# Patient Record
Sex: Male | Born: 1935 | Race: White | Hispanic: No | Marital: Married | Smoking: Former smoker
Health system: Southern US, Community
[De-identification: ages and names within clinical notes are randomized; demographics above are authoritative.]

## PROBLEM LIST (undated history)

## (undated) ENCOUNTER — Emergency Department (HOSPITAL_COMMUNITY): Admission: EM | Payer: Medicare Other | Source: Home / Self Care

## (undated) DIAGNOSIS — G20 Parkinson's disease: Secondary | ICD-10-CM

## (undated) DIAGNOSIS — I219 Acute myocardial infarction, unspecified: Secondary | ICD-10-CM

## (undated) DIAGNOSIS — G20A1 Parkinson's disease without dyskinesia, without mention of fluctuations: Secondary | ICD-10-CM

## (undated) DIAGNOSIS — I2510 Atherosclerotic heart disease of native coronary artery without angina pectoris: Secondary | ICD-10-CM

## (undated) DIAGNOSIS — E785 Hyperlipidemia, unspecified: Secondary | ICD-10-CM

## (undated) DIAGNOSIS — L0390 Cellulitis, unspecified: Secondary | ICD-10-CM

## (undated) DIAGNOSIS — I10 Essential (primary) hypertension: Secondary | ICD-10-CM

## (undated) DIAGNOSIS — G2581 Restless legs syndrome: Secondary | ICD-10-CM

## (undated) HISTORY — DX: Parkinson's disease: G20

## (undated) HISTORY — PX: CORONARY ARTERY BYPASS GRAFT: SHX141

## (undated) HISTORY — DX: Acute myocardial infarction, unspecified: I21.9

## (undated) HISTORY — PX: CARPAL TUNNEL RELEASE: SHX101

## (undated) HISTORY — DX: Cellulitis, unspecified: L03.90

## (undated) HISTORY — PX: APPENDECTOMY: SHX54

## (undated) HISTORY — DX: Essential (primary) hypertension: I10

## (undated) HISTORY — DX: Parkinson's disease without dyskinesia, without mention of fluctuations: G20.A1

---

## 1998-03-03 HISTORY — PX: CARDIAC SURGERY: SHX584

## 2001-03-03 HISTORY — PX: STOMACH SURGERY: SHX791

## 2016-09-30 ENCOUNTER — Encounter: Payer: Self-pay | Admitting: Neurology

## 2016-10-09 ENCOUNTER — Telehealth: Payer: Self-pay

## 2016-10-09 NOTE — Telephone Encounter (Signed)
SENT NOTES TO SCHEDULING 

## 2016-10-16 ENCOUNTER — Telehealth: Payer: Self-pay | Admitting: Internal Medicine

## 2016-10-16 NOTE — Telephone Encounter (Signed)
Received records from [facility] for appointment on 11/04/16 with Dr Rennis GoldenHilty.  Records put with Dr Blanchie DessertHilty's schedule for 11/04/16. lp

## 2016-10-21 ENCOUNTER — Ambulatory Visit (HOSPITAL_BASED_OUTPATIENT_CLINIC_OR_DEPARTMENT_OTHER): Payer: Self-pay

## 2016-10-22 ENCOUNTER — Encounter: Payer: Self-pay | Admitting: Neurology

## 2016-10-28 NOTE — Progress Notes (Signed)
Jack Carter was seen today in the movement disorders clinic for neurologic consultation at the request of Le, [name], DO.  The consultation is for the evaluation of Parkinsons.  Patient is accompanied by his son in law who supplements the history.  Pt previously taken care of in [place] (Dr. Daryel Gerald) but no records are available.  I have reviewed records from PCP.  Patient reports that he is diagnosed with Parkinson's disease in approximately 1-2 years ago.  His first symptom was shuffling of the feet and tremor.  The patient is currently on Stalevo 50, 1 tablet 4 times per day at 6 AM/10 AM/2 PM/6 PM. The patient is on Apokyn 30 mg/3  0.4 mL subcutaneous 5 times per day (son in law states that they d/c that 3 days ago because they felt that the patient was overdosing on the medication.  Felt that patient became mentally clearer when they d/c it) The patient is on amantadine 100 mg 3 times per day The patient is on Requip XL 4 mg 3 times per day The patient is on gabapentin 600 mg 4 times per day The patient is on propranolol 60 mg bid The patient is on primidone 50 mg bid  Specific Symptoms:  Tremor: Yes.  , hands bilaterally with use  Family hx of similar:  No. Voice: softer, never did voice therapy Sleep: sleeping okay per patient but son in law denies  Vivid Dreams:  Yes.    Acting out dreams:  Yes.   Wet Pillows: No. Postural symptoms:  Yes.    Falls?  Yes.  , 1 per day ([facility]; family just got him WC; uses rollator) Bradykinesia symptoms: shuffling gait, slow movements and difficulty getting out of a chair; states that he has never done PT Loss of smell:  Yes.   Loss of taste:  No. Urinary Incontinence:  Not usually unless related to lasix Difficulty Swallowing:  No. Handwriting, micrographia: Yes.   Trouble with ADL's:  No. but hes slow  Trouble buttoning clothing: Yes.  but he is able to do it.  Some trouble with tying shoes Depression:  No. Memory  changes:  Yes.   (short term) Hallucinations:  Yes.  , always recognizes not real  visual distortions: Yes.   N/V:  No. Lightheaded:  No.  Syncope: No. Diplopia:  Yes.   Dyskinesia:  No.  Neuroimaging of the brain is not available  PREVIOUS MEDICATIONS: Amantadine and stalevo, requip, apokyn  ALLERGIES:  No Known Allergies  CURRENT MEDICATIONS:  Outpatient Encounter Prescriptions as of 10/30/2016  Medication Sig  . amantadine (SYMMETREL) 100 MG capsule Take 100 mg by mouth 3 (three) times daily.  Marland Kitchen aspirin EC 81 MG tablet Take 81 mg by mouth daily.  Marland Kitchen atorvastatin (LIPITOR) 10 MG tablet Take 10 mg by mouth daily.  . carbidopa-levodopa-entacapone (STALEVO) 12.5-50-200 MG tablet Take 1 tablet by mouth 4 (four) times daily.  . furosemide (LASIX) 40 MG tablet Take 80 mg by mouth daily.  Marland Kitchen gabapentin (NEURONTIN) 600 MG tablet Take 600 mg by mouth 4 (four) times daily.  . metolazone (ZAROXOLYN) 5 MG tablet Take 5 mg by mouth 3 (three) times a week.  . MULTIPLE VITAMIN PO Take by mouth daily.  Marland Kitchen omeprazole (PRILOSEC) 40 MG capsule Take 40 mg by mouth daily.  . potassium chloride SA (K-DUR,KLOR-CON) 20 MEQ tablet Take 20 mEq by mouth. 2 in the morning, one at night  . primidone (MYSOLINE) 50  MG tablet Take 50 mg by mouth 2 (two) times daily.  . propranolol (INDERAL) 60 MG tablet Take 60 mg by mouth 2 (two) times daily.  Marland Kitchen rOPINIRole (REQUIP XL) 4 MG 24 hr tablet Take 4 mg by mouth 3 (three) times daily.  Marland Kitchen sulfamethoxazole-trimethoprim (BACTRIM DS,SEPTRA DS) 800-160 MG tablet TK 1 T PO BID FOR 10 DAYS  . traMADol (ULTRAM) 50 MG tablet Take by mouth 3 (three) times daily.  Marland Kitchen trimethobenzamide (TIGAN) 300 MG capsule Take 300 mg by mouth 3 (three) times daily.  . valsartan (DIOVAN) 160 MG tablet Take 160 mg by mouth 2 (two) times daily.  Marland Kitchen Apomorphine HCl (APOKYN) 30 MG/3ML SOCT Inject into the skin 5 (five) times daily.  . [DISCONTINUED] propranolol ER (INDERAL LA) 60 MG 24 hr capsule Take  60 mg by mouth 2 (two) times daily.   No facility-administered encounter medications on file as of 10/30/2016.     PAST MEDICAL HISTORY:   Past Medical History:  Diagnosis Date  . Cellulitis   . Heart attack (HCC)   . Hypertension   . Parkinson's disease (HCC)     PAST SURGICAL HISTORY:   Past Surgical History:  Procedure Laterality Date  . APPENDECTOMY    . CARDIAC SURGERY  2000  . CARPAL TUNNEL RELEASE Left   . STOMACH SURGERY  2003   intestinal fissure    SOCIAL HISTORY:   Social History   Social History  . Marital status: Single    Spouse name: N/A  . Number of children: N/A  . Years of education: N/A   Occupational History  . Not on file.   Social History Main Topics  . Smoking status: Never Smoker  . Smokeless tobacco: Never Used  . Alcohol use No  . Drug use: No  . Sexual activity: Not on file   Other Topics Concern  . Not on file   Social History Narrative  . No narrative on file    FAMILY HISTORY:   Family Status  Relation Status  . Mother Deceased  . Father Deceased       unknown  . Sister Alive  . Daughter Alive    ROS:  A complete 10 system review of systems was obtained and was unremarkable apart from what is mentioned above.  PHYSICAL EXAMINATION:    VITALS:   Vitals:   10/30/16 1323  BP: 112/60  Pulse: (!) 56  SpO2: 93%  Weight: 174 lb (78.9 kg)  Height: 5\' 9"  (1.753 m)    GEN:  The patient appears stated age and is in NAD. HEENT:  Normocephalic, atraumatic.  The mucous membranes are dry. The superficial temporal arteries are without ropiness or tenderness. CV:  RRR Lungs:  CTAB Neck/HEME:  There are no carotid bruits bilaterally. Skin:  Has evidence of cellulitis in the legs (on abx per son in law)  Neurological examination:  Orientation: The patient is alert and oriented x3. However, cannot recall what had for dinner last night.  Has good concentration.  Trouble with recall.  Looks to son in law for details of  hx. Cranial nerves: There is good facial symmetry. Pupils are equal round and reactive to light bilaterally. Fundoscopic exam is attempted but the disc margins are not well visualized bilaterally. Extraocular muscles are intact. The visual fields are full to confrontational testing. The speech is fluent and slightly dysarthric.  He is hypophonic.  Soft palate rises symmetrically and there is no tongue deviation. Hearing is decreased to  conversational tone. Sensation: Sensation is intact to light and pinprick throughout (facial, trunk, extremities). Vibration is intact at the bilateral big toe. There is no extinction with double simultaneous stimulation. There is no sensory dermatomal level identified. Motor: Strength is 5/5 in the bilateral upper and lower extremities.   Shoulder shrug is equal and symmetric.  There is no pronator drift. Deep tendon reflexes: Deep tendon reflexes are 2-/4 at the bilateral biceps, triceps, brachioradialis, patella and trace at the bilateral  achilles. Plantar responses is up on the right and neutral on the left  Movement examination: Tone: There is normal tone in the bilateral upper extremities.  The tone in the lower extremities is normal.  Abnormal movements: There is mild dyskinesia, especially in the legs Coordination:  There is  decremation with RAM's, with alternating supination and pronation of the forearm, hand opening and closing, finger taps, heel taps and toe taps on the L.  RAMs on the R are fairly good.  Mouth opens rhythmically when asked to do RAMs in arms/legs Gait and Station: The patient has  difficulty arising out of a deep-seated chair without the use of the hands and requires pushing off of the chair.  He uses a Rollator to ambulate.  Even with this, he has freezing and start hesitation.  He shuffles.  He drags the left leg.  He stops in doorways.  Labs: I got lab work from his primary care physician.  His sodium was 144, potassium 3.9, chloride 106,  CO2 31, BUN 19 and creatinine 1.18.  His glucose was 95.  His white blood cells were 7.0, hemoglobin 13.2, hematocrit 40.6 and platelets were low at 126.  ASSESSMENT/PLAN:  1.  Parkinsonism.    -We discussed the diagnosis as well as pathophysiology of the disease.  We discussed treatment options as well as prognostic indicators.  Patient education was provided.  -We discussed that it used to be thought that levodopa would increase risk of melanoma but now it is believed that Parkinsons itself likely increases risk of melanoma. he is to get regular skin checks.  -The patient was referred for home physical, occupational and speech therapy.  He is to use his walker at all times.  -We discussed community resources in the area including patient support groups and community exercise programs for PD and pt education was provided to the patient.  -It is somewhat unusual that the patient is taking Apokyn so frequently (5 times per day which is maximum number of times per day) and is on a very low dose of Stalevo 50 mg 4 times per day.  His family just stopped his Apokyn a few days ago and found that this did help clear him mentally.  -It is also somewhat unusual that the patient is taking Requip XL but is taking this 3 times per day (4 mg 3 times per day).  I discussed with the patient and his son-in-law that I think his medications greatly need rework.  He is on several medications that affect cognition and memory (Apokyn, Requip XL, amantadine) and yet he is on low-dose Stalevo.  He has hallucinations and confusion and these medications really need to be tapered and ultimately discontinued.  His Apokyn, as above has already been discontinued.  -They have lots of Stalevo at home, but unless they get it through the [facility], it is expensive for them so unless I changed him to traditional carbidopa/levodopa it is going to be difficult to raise the dose.  In  the end, we decided to slowly transition him so  that we do not waste his Stalevo.  He is going to take Stalevo 50 mg at 6 AM/L-dopa/levodopa 25/100 at 10 AM, Stalevo 150 at 2 PM and carbidopa/levodopa 25/100 at 6 PM.  -I will decrease his Requip XL from 4 mg 3 times per day to 4 mg twice per day for 2 weeks.  If he is doing okay he will drop down to 4 mg once per day and hold it there.  -For now, he will continue the amantadine 100 mg 3 times per day.  I plan to get him off of that in the future.  -For now, I will continue him on the primidone and propranolol.  I do not plan to continue these long term.   -The patient's family is administering medications.  -I really need to get his prior records to understand better exactly his diagnosis and medication load.  the patient reports that he was just diagnosed about a year ago.  This medication load would be very unusual for a patient just diagnosed a year ago, as would this degree of memory loss.  A release was signed today.  2.  I will see him back in 6-8 weeks.  This was a very high complexity visit.  Much greater than 50% of this visit was spent in counseling and coordinating care.  Total face to face time:  60 min.   Cc:  Le, [name], DO

## 2016-10-30 ENCOUNTER — Ambulatory Visit (INDEPENDENT_AMBULATORY_CARE_PROVIDER_SITE_OTHER): Payer: Medicare Other | Admitting: Neurology

## 2016-10-30 ENCOUNTER — Telehealth: Payer: Self-pay | Admitting: Neurology

## 2016-10-30 ENCOUNTER — Encounter: Payer: Self-pay | Admitting: Neurology

## 2016-10-30 VITALS — BP 112/60 | HR 56 | Ht 69.0 in | Wt 174.0 lb

## 2016-10-30 DIAGNOSIS — G20 Parkinson's disease: Secondary | ICD-10-CM

## 2016-10-30 DIAGNOSIS — R441 Visual hallucinations: Secondary | ICD-10-CM

## 2016-10-30 MED ORDER — CARBIDOPA-LEVODOPA 25-100 MG PO TABS: 1.0000 | ORAL_TABLET | Freq: Two times a day (BID) | ORAL | 1 refills | Status: DC

## 2016-10-30 NOTE — Telephone Encounter (Signed)
Referral to [facility] to add OT/ST faxed to [phone] with confirmation received. They will contact patient.

## 2016-10-30 NOTE — Patient Instructions (Addendum)
1.  Take stalevo 50 mg at 6am, take then NEW carbidopa/levodopa 25/100 at 10 am, take stalevo at 2pm, take the new carbidopa/levodopa 25/100 at 6pm.  Remember to take this 30 min before the meals  2.  Stay off of the apokyn  3.  Decrease requip XL (ropinirole XL) - 4 mg TWICE per day (not three times per day).  If doing okay in 2 weeks, drop to 4 mg (one tablet) once per day and hold it there  4.  For now, continue the amantadine.  I will change that in the future  5.  For now, continue the primidone.  I will likely change that in the future  6.  We will add occupational and speech therapy to your [facility] home health orders.   7.  Use your walker at all times  8.  Call me if you have more problems with mobility as we are dropping your meds

## 2016-10-31 ENCOUNTER — Inpatient Hospital Stay (HOSPITAL_COMMUNITY): Payer: Medicare Other

## 2016-10-31 ENCOUNTER — Emergency Department (HOSPITAL_COMMUNITY): Payer: Medicare Other

## 2016-10-31 ENCOUNTER — Inpatient Hospital Stay (HOSPITAL_COMMUNITY)
Admission: EM | Admit: 2016-10-31 | Discharge: 2016-11-18 | DRG: 004 | Disposition: A | Discharge status: Other Acute Inpatient Hospital | Payer: Medicare Other | Attending: Internal Medicine | Admitting: Internal Medicine

## 2016-10-31 DIAGNOSIS — I509 Heart failure, unspecified: Secondary | ICD-10-CM

## 2016-10-31 DIAGNOSIS — R001 Bradycardia, unspecified: Secondary | ICD-10-CM

## 2016-10-31 DIAGNOSIS — A419 Sepsis, unspecified organism: Secondary | ICD-10-CM | POA: Diagnosis present

## 2016-10-31 DIAGNOSIS — Z9289 Personal history of other medical treatment: Secondary | ICD-10-CM

## 2016-10-31 DIAGNOSIS — E860 Dehydration: Secondary | ICD-10-CM | POA: Diagnosis not present

## 2016-10-31 DIAGNOSIS — I25810 Atherosclerosis of coronary artery bypass graft(s) without angina pectoris: Secondary | ICD-10-CM | POA: Diagnosis present

## 2016-10-31 DIAGNOSIS — E876 Hypokalemia: Secondary | ICD-10-CM | POA: Diagnosis not present

## 2016-10-31 DIAGNOSIS — R778 Other specified abnormalities of plasma proteins: Secondary | ICD-10-CM | POA: Diagnosis present

## 2016-10-31 DIAGNOSIS — Z7189 Other specified counseling: Secondary | ICD-10-CM

## 2016-10-31 DIAGNOSIS — J9690 Respiratory failure, unspecified, unspecified whether with hypoxia or hypercapnia: Secondary | ICD-10-CM

## 2016-10-31 DIAGNOSIS — L03116 Cellulitis of left lower limb: Secondary | ICD-10-CM | POA: Diagnosis present

## 2016-10-31 DIAGNOSIS — G20 Parkinson's disease: Secondary | ICD-10-CM | POA: Diagnosis present

## 2016-10-31 DIAGNOSIS — T370X5A Adverse effect of sulfonamides, initial encounter: Secondary | ICD-10-CM | POA: Diagnosis present

## 2016-10-31 DIAGNOSIS — J80 Acute respiratory distress syndrome: Secondary | ICD-10-CM | POA: Diagnosis present

## 2016-10-31 DIAGNOSIS — R570 Cardiogenic shock: Secondary | ICD-10-CM

## 2016-10-31 DIAGNOSIS — Z7982 Long term (current) use of aspirin: Secondary | ICD-10-CM

## 2016-10-31 DIAGNOSIS — N170 Acute kidney failure with tubular necrosis: Secondary | ICD-10-CM | POA: Diagnosis present

## 2016-10-31 DIAGNOSIS — J690 Pneumonitis due to inhalation of food and vomit: Secondary | ICD-10-CM | POA: Diagnosis not present

## 2016-10-31 DIAGNOSIS — I130 Hypertensive heart and chronic kidney disease with heart failure and stage 1 through stage 4 chronic kidney disease, or unspecified chronic kidney disease: Secondary | ICD-10-CM | POA: Diagnosis present

## 2016-10-31 DIAGNOSIS — K922 Gastrointestinal hemorrhage, unspecified: Secondary | ICD-10-CM

## 2016-10-31 DIAGNOSIS — Z0189 Encounter for other specified special examinations: Secondary | ICD-10-CM

## 2016-10-31 DIAGNOSIS — I2510 Atherosclerotic heart disease of native coronary artery without angina pectoris: Secondary | ICD-10-CM | POA: Diagnosis present

## 2016-10-31 DIAGNOSIS — R748 Abnormal levels of other serum enzymes: Secondary | ICD-10-CM | POA: Diagnosis not present

## 2016-10-31 DIAGNOSIS — G92 Toxic encephalopathy: Secondary | ICD-10-CM | POA: Diagnosis present

## 2016-10-31 DIAGNOSIS — Z951 Presence of aortocoronary bypass graft: Secondary | ICD-10-CM

## 2016-10-31 DIAGNOSIS — M79676 Pain in unspecified toe(s): Secondary | ICD-10-CM

## 2016-10-31 DIAGNOSIS — E870 Hyperosmolality and hypernatremia: Secondary | ICD-10-CM | POA: Diagnosis not present

## 2016-10-31 DIAGNOSIS — Z978 Presence of other specified devices: Secondary | ICD-10-CM

## 2016-10-31 DIAGNOSIS — R7989 Other specified abnormal findings of blood chemistry: Secondary | ICD-10-CM

## 2016-10-31 DIAGNOSIS — J9600 Acute respiratory failure, unspecified whether with hypoxia or hypercapnia: Secondary | ICD-10-CM

## 2016-10-31 DIAGNOSIS — T17908A Unspecified foreign body in respiratory tract, part unspecified causing other injury, initial encounter: Secondary | ICD-10-CM

## 2016-10-31 DIAGNOSIS — Z430 Encounter for attention to tracheostomy: Secondary | ICD-10-CM | POA: Diagnosis not present

## 2016-10-31 DIAGNOSIS — I340 Nonrheumatic mitral (valve) insufficiency: Secondary | ICD-10-CM | POA: Diagnosis present

## 2016-10-31 DIAGNOSIS — I5043 Acute on chronic combined systolic (congestive) and diastolic (congestive) heart failure: Secondary | ICD-10-CM | POA: Diagnosis present

## 2016-10-31 DIAGNOSIS — I5084 End stage heart failure: Secondary | ICD-10-CM | POA: Diagnosis present

## 2016-10-31 DIAGNOSIS — R0600 Dyspnea, unspecified: Secondary | ICD-10-CM | POA: Diagnosis not present

## 2016-10-31 DIAGNOSIS — E785 Hyperlipidemia, unspecified: Secondary | ICD-10-CM | POA: Diagnosis present

## 2016-10-31 DIAGNOSIS — N179 Acute kidney failure, unspecified: Secondary | ICD-10-CM | POA: Diagnosis present

## 2016-10-31 DIAGNOSIS — Z452 Encounter for adjustment and management of vascular access device: Secondary | ICD-10-CM

## 2016-10-31 DIAGNOSIS — R6521 Severe sepsis with septic shock: Secondary | ICD-10-CM | POA: Diagnosis present

## 2016-10-31 DIAGNOSIS — G9340 Encephalopathy, unspecified: Secondary | ICD-10-CM

## 2016-10-31 DIAGNOSIS — J9601 Acute respiratory failure with hypoxia: Secondary | ICD-10-CM | POA: Diagnosis not present

## 2016-10-31 DIAGNOSIS — Z87891 Personal history of nicotine dependence: Secondary | ICD-10-CM

## 2016-10-31 DIAGNOSIS — T502X5A Adverse effect of carbonic-anhydrase inhibitors, benzothiadiazides and other diuretics, initial encounter: Secondary | ICD-10-CM | POA: Diagnosis not present

## 2016-10-31 DIAGNOSIS — Z66 Do not resuscitate: Secondary | ICD-10-CM | POA: Diagnosis present

## 2016-10-31 DIAGNOSIS — L03115 Cellulitis of right lower limb: Secondary | ICD-10-CM | POA: Diagnosis present

## 2016-10-31 DIAGNOSIS — G2581 Restless legs syndrome: Secondary | ICD-10-CM | POA: Diagnosis present

## 2016-10-31 DIAGNOSIS — G629 Polyneuropathy, unspecified: Secondary | ICD-10-CM | POA: Diagnosis present

## 2016-10-31 DIAGNOSIS — E875 Hyperkalemia: Secondary | ICD-10-CM

## 2016-10-31 DIAGNOSIS — T17908D Unspecified foreign body in respiratory tract, part unspecified causing other injury, subsequent encounter: Secondary | ICD-10-CM | POA: Diagnosis not present

## 2016-10-31 DIAGNOSIS — L03119 Cellulitis of unspecified part of limb: Secondary | ICD-10-CM | POA: Diagnosis not present

## 2016-10-31 DIAGNOSIS — F0280 Dementia in other diseases classified elsewhere without behavioral disturbance: Secondary | ICD-10-CM | POA: Diagnosis not present

## 2016-10-31 DIAGNOSIS — R0902 Hypoxemia: Secondary | ICD-10-CM

## 2016-10-31 DIAGNOSIS — G9341 Metabolic encephalopathy: Secondary | ICD-10-CM | POA: Diagnosis not present

## 2016-10-31 DIAGNOSIS — J189 Pneumonia, unspecified organism: Secondary | ICD-10-CM

## 2016-10-31 DIAGNOSIS — R739 Hyperglycemia, unspecified: Secondary | ICD-10-CM | POA: Diagnosis not present

## 2016-10-31 DIAGNOSIS — I214 Non-ST elevation (NSTEMI) myocardial infarction: Secondary | ICD-10-CM | POA: Diagnosis not present

## 2016-10-31 DIAGNOSIS — D638 Anemia in other chronic diseases classified elsewhere: Secondary | ICD-10-CM | POA: Diagnosis present

## 2016-10-31 DIAGNOSIS — L0390 Cellulitis, unspecified: Secondary | ICD-10-CM | POA: Diagnosis present

## 2016-10-31 DIAGNOSIS — Z789 Other specified health status: Secondary | ICD-10-CM

## 2016-10-31 DIAGNOSIS — I5041 Acute combined systolic (congestive) and diastolic (congestive) heart failure: Secondary | ICD-10-CM | POA: Diagnosis not present

## 2016-10-31 HISTORY — DX: Atherosclerotic heart disease of native coronary artery without angina pectoris: I25.10

## 2016-10-31 HISTORY — DX: Restless legs syndrome: G25.81

## 2016-10-31 HISTORY — DX: Hyperlipidemia, unspecified: E78.5

## 2016-10-31 HISTORY — DX: Essential (primary) hypertension: I10

## 2016-10-31 LAB — BASIC METABOLIC PANEL
Anion gap: 7 (ref 5–15)
Anion gap: 7 (ref 5–15)
BUN: 52 mg/dL — AB (ref 6–20)
BUN: 51 mg/dL — AB (ref 6–20)
CALCIUM: 8.5 mg/dL — AB (ref 8.9–10.3)
CALCIUM: 9.1 mg/dL (ref 8.9–10.3)
CO2: 20 mmol/L — AB (ref 22–32)
CO2: 24 mmol/L (ref 22–32)
CREATININE: 2.89 mg/dL — AB (ref 0.61–1.24)
Chloride: 107 mmol/L (ref 101–111)
Chloride: 107 mmol/L (ref 101–111)
Creatinine, Ser: 2.99 mg/dL — ABNORMAL HIGH (ref 0.61–1.24)
GFR calc Af Amer: 21 mL/min — ABNORMAL LOW (ref >60)
GFR calc Af Amer: 22 mL/min — ABNORMAL LOW (ref >60)
GFR, EST NON AFRICAN AMERICAN: 18 mL/min — AB (ref >60)
GFR, EST NON AFRICAN AMERICAN: 19 mL/min — AB (ref >60)
GLUCOSE: 160 mg/dL — AB (ref 65–99)
GLUCOSE: 231 mg/dL — AB (ref 65–99)
Potassium: >7.5 mmol/L (ref 3.5–5.1)
Potassium: 7 mmol/L (ref 3.5–5.1)
Sodium: 134 mmol/L — ABNORMAL LOW (ref 135–145)
Sodium: 138 mmol/L (ref 135–145)

## 2016-10-31 LAB — URINALYSIS, ROUTINE W REFLEX MICROSCOPIC
BILIRUBIN URINE: NEGATIVE
Glucose, UA: NEGATIVE mg/dL
Ketones, ur: 5 mg/dL — AB
Leukocytes, UA: NEGATIVE
Nitrite: NEGATIVE
PROTEIN: 100 mg/dL — AB
SPECIFIC GRAVITY, URINE: 1.021 (ref 1.005–1.030)
pH: 5 (ref 5.0–8.0)

## 2016-10-31 LAB — I-STAT CHEM 8, ED
BUN: 47 mg/dL — ABNORMAL HIGH (ref 6–20)
BUN: 46 mg/dL — ABNORMAL HIGH (ref 6–20)
CALCIUM ION: 1.18 mmol/L (ref 1.15–1.40)
CHLORIDE: 110 mmol/L (ref 101–111)
Calcium, Ion: 1.03 mmol/L — ABNORMAL LOW (ref 1.15–1.40)
Chloride: 112 mmol/L — ABNORMAL HIGH (ref 101–111)
Creatinine, Ser: 2.9 mg/dL — ABNORMAL HIGH (ref 0.61–1.24)
Creatinine, Ser: 2.9 mg/dL — ABNORMAL HIGH (ref 0.61–1.24)
GLUCOSE: 151 mg/dL — AB (ref 65–99)
GLUCOSE: 142 mg/dL — AB (ref 65–99)
HCT: 37 % — ABNORMAL LOW (ref 39.0–52.0)
HCT: 37 % — ABNORMAL LOW (ref 39.0–52.0)
HEMOGLOBIN: 12.6 g/dL — AB (ref 13.0–17.0)
HEMOGLOBIN: 12.6 g/dL — AB (ref 13.0–17.0)
POTASSIUM: 8.2 mmol/L — AB (ref 3.5–5.1)
Potassium: 6.8 mmol/L (ref 3.5–5.1)
SODIUM: 138 mmol/L (ref 135–145)
SODIUM: 144 mmol/L (ref 135–145)
TCO2: 20 mmol/L — ABNORMAL LOW (ref 22–32)
TCO2: 19 mmol/L — ABNORMAL LOW (ref 22–32)

## 2016-10-31 LAB — I-STAT CG4 LACTIC ACID, ED
LACTIC ACID, VENOUS: 4.11 mmol/L — AB (ref 0.5–1.9)
Lactic Acid, Venous: 2.05 mmol/L (ref 0.5–1.9)

## 2016-10-31 LAB — CBC WITH DIFFERENTIAL/PLATELET
BASOS ABS: 0 10*3/uL (ref 0.0–0.1)
Basophils Relative: 0 %
EOS PCT: 1 %
Eosinophils Absolute: 0.1 10*3/uL (ref 0.0–0.7)
HEMATOCRIT: 36.6 % — AB (ref 39.0–52.0)
Hemoglobin: 11.6 g/dL — ABNORMAL LOW (ref 13.0–17.0)
LYMPHS PCT: 6 %
Lymphs Abs: 0.8 10*3/uL (ref 0.7–4.0)
MCH: 31 pg (ref 26.0–34.0)
MCHC: 31.7 g/dL (ref 30.0–36.0)
MCV: 97.9 fL (ref 78.0–100.0)
MONO ABS: 0.2 10*3/uL (ref 0.1–1.0)
MONOS PCT: 1 %
NEUTROS ABS: 12.5 10*3/uL — AB (ref 1.7–7.7)
Neutrophils Relative %: 92 %
PLATELETS: 158 10*3/uL (ref 150–400)
RBC: 3.74 MIL/uL — ABNORMAL LOW (ref 4.22–5.81)
RDW: 15.8 % — AB (ref 11.5–15.5)
WBC: 13.6 10*3/uL — ABNORMAL HIGH (ref 4.0–10.5)

## 2016-10-31 LAB — HEPATIC FUNCTION PANEL
ALBUMIN: 3.2 g/dL — AB (ref 3.5–5.0)
ALK PHOS: 195 U/L — AB (ref 38–126)
ALT: 27 U/L (ref 17–63)
AST: 57 U/L — AB (ref 15–41)
BILIRUBIN INDIRECT: 0.9 mg/dL (ref 0.3–0.9)
Bilirubin, Direct: 0.4 mg/dL (ref 0.1–0.5)
TOTAL PROTEIN: 7.1 g/dL (ref 6.5–8.1)
Total Bilirubin: 1.3 mg/dL — ABNORMAL HIGH (ref 0.3–1.2)

## 2016-10-31 LAB — I-STAT TROPONIN, ED: Troponin i, poc: 0.11 ng/mL (ref 0.00–0.08)

## 2016-10-31 LAB — PROTIME-INR
INR: 1.1
PROTHROMBIN TIME: 14.1 s (ref 11.4–15.2)

## 2016-10-31 LAB — BRAIN NATRIURETIC PEPTIDE: B Natriuretic Peptide: 205.9 pg/mL — ABNORMAL HIGH (ref 0.0–100.0)

## 2016-10-31 LAB — CBG MONITORING, ED: GLUCOSE-CAPILLARY: 77 mg/dL (ref 65–99)

## 2016-10-31 LAB — TROPONIN I: Troponin I: 0.13 ng/mL (ref <0.03)

## 2016-10-31 LAB — APTT: aPTT: 21 seconds — ABNORMAL LOW (ref 24–36)

## 2016-10-31 MED ORDER — ASPIRIN 300 MG RE SUPP: 300.0000 mg | RECTAL | Status: DC

## 2016-10-31 MED ORDER — INSULIN ASPART 100 UNIT/ML IV SOLN
10.0000 [IU] | Freq: Once | INTRAVENOUS | Status: AC
  Administered 2016-10-31: 10 [IU] via INTRAVENOUS
  Filled 2016-10-31: qty 0.1

## 2016-10-31 MED ORDER — DOPAMINE-DEXTROSE 3.2-5 MG/ML-% IV SOLN
INTRAVENOUS | Status: DC | PRN
  Administered 2016-10-31: 5 ug/kg/min via INTRAVENOUS

## 2016-10-31 MED ORDER — SODIUM POLYSTYRENE SULFONATE 15 GM/60ML PO SUSP
60.0000 g | Freq: Once | ORAL | Status: AC
  Administered 2016-11-01: 60 g
  Filled 2016-10-31: qty 240

## 2016-10-31 MED ORDER — ATROPINE SULFATE 1 MG/ML IJ SOLN
INTRAMUSCULAR | Status: DC | PRN
  Administered 2016-10-31 (×5): .5 mg via INTRAVENOUS

## 2016-10-31 MED ORDER — SODIUM CHLORIDE 0.9 % IV SOLN: 250.0000 mL | INTRAVENOUS | Status: DC | PRN

## 2016-10-31 MED ORDER — CALCIUM GLUCONATE 10 % IV SOLN
1.0000 g | Freq: Once | INTRAVENOUS | Status: AC
  Administered 2016-10-31: 1 g via INTRAVENOUS
  Filled 2016-10-31: qty 10

## 2016-10-31 MED ORDER — FENTANYL 2500MCG IN NS 250ML (10MCG/ML) PREMIX INFUSION
0.0000 ug/h | INTRAVENOUS | Status: DC
  Administered 2016-10-31: 20 ug/h via INTRAVENOUS
  Administered 2016-11-01: 250 ug/h via INTRAVENOUS
  Filled 2016-10-31 (×2): qty 250

## 2016-10-31 MED ORDER — SODIUM CHLORIDE 0.9 % IV SOLN
80.0000 mg | Freq: Once | INTRAVENOUS | Status: AC
  Administered 2016-10-31: 80 mg via INTRAVENOUS
  Filled 2016-10-31: qty 80

## 2016-10-31 MED ORDER — ROCURONIUM BROMIDE 50 MG/5ML IV SOLN
INTRAVENOUS | Status: DC | PRN
  Administered 2016-10-31: 100 mg via INTRAVENOUS

## 2016-10-31 MED ORDER — DEXTROSE 5 % IV SOLN
0.0000 ug/min | Freq: Once | INTRAVENOUS | Status: AC
  Administered 2016-10-31: 5 ug/min via INTRAVENOUS
  Filled 2016-10-31: qty 4

## 2016-10-31 MED ORDER — INSULIN ASPART 100 UNIT/ML ~~LOC~~ SOLN
SUBCUTANEOUS | Status: AC
  Filled 2016-10-31: qty 1

## 2016-10-31 MED ORDER — ASPIRIN 81 MG PO CHEW: 324.0000 mg | CHEWABLE_TABLET | ORAL | Status: DC

## 2016-10-31 MED ORDER — VANCOMYCIN HCL 10 G IV SOLR
1500.0000 mg | Freq: Once | INTRAVENOUS | Status: AC
  Administered 2016-10-31: 1500 mg via INTRAVENOUS
  Filled 2016-10-31: qty 1500

## 2016-10-31 MED ORDER — NOREPINEPHRINE BITARTRATE 1 MG/ML IV SOLN
0.0000 ug/min | INTRAVENOUS | Status: DC
  Administered 2016-11-01: 4 ug/min via INTRAVENOUS
  Administered 2016-11-01: 8 ug/min via INTRAVENOUS
  Administered 2016-11-01: 4 ug/min via INTRAVENOUS
  Administered 2016-11-01: 12 ug/min via INTRAVENOUS
  Filled 2016-10-31 (×3): qty 4

## 2016-10-31 MED ORDER — PIPERACILLIN-TAZOBACTAM 3.375 G IVPB
3.3750 g | Freq: Three times a day (TID) | INTRAVENOUS | Status: DC
  Administered 2016-11-01: 3.375 g via INTRAVENOUS
  Filled 2016-10-31 (×2): qty 50

## 2016-10-31 MED ORDER — SODIUM CHLORIDE 0.9 % IV SOLN
8.0000 mg/h | INTRAVENOUS | Status: DC
  Administered 2016-10-31 – 2016-11-02 (×6): 8 mg/h via INTRAVENOUS
  Filled 2016-10-31 (×7): qty 80

## 2016-10-31 MED ORDER — DEXTROSE 50 % IV SOLN
INTRAVENOUS | Status: DC | PRN
  Administered 2016-10-31: 50 g via INTRAVENOUS

## 2016-10-31 MED ORDER — SODIUM BICARBONATE 8.4 % IV SOLN
INTRAVENOUS | Status: DC | PRN
  Administered 2016-10-31 (×2): 50 meq via INTRAVENOUS

## 2016-10-31 MED ORDER — EPINEPHRINE PF 1 MG/10ML IJ SOSY
PREFILLED_SYRINGE | INTRAMUSCULAR | Status: DC | PRN
  Administered 2016-10-31: 1 mg via INTRAVENOUS

## 2016-10-31 MED ORDER — ETOMIDATE 2 MG/ML IV SOLN
INTRAVENOUS | Status: DC | PRN
  Administered 2016-10-31: 8 mg via INTRAVENOUS

## 2016-10-31 MED ORDER — DEXTROSE 50 % IV SOLN
1.0000 | Freq: Once | INTRAVENOUS | Status: AC
  Administered 2016-10-31: 50 mL via INTRAVENOUS
  Filled 2016-10-31: qty 50

## 2016-10-31 MED ORDER — CALCIUM CHLORIDE 10 % IV SOLN
INTRAVENOUS | Status: DC | PRN
  Administered 2016-10-31: 1 g via INTRAVENOUS

## 2016-10-31 MED ORDER — PANTOPRAZOLE SODIUM 40 MG IV SOLR
40.0000 mg | Freq: Two times a day (BID) | INTRAVENOUS | Status: DC
  Administered 2016-11-04 – 2016-11-05 (×4): 40 mg via INTRAVENOUS
  Filled 2016-10-31 (×5): qty 40

## 2016-10-31 MED ORDER — PIPERACILLIN-TAZOBACTAM 3.375 G IVPB 30 MIN
3.3750 g | Freq: Once | INTRAVENOUS | Status: AC
  Administered 2016-10-31: 3.375 g via INTRAVENOUS
  Filled 2016-10-31: qty 50

## 2016-10-31 MED ORDER — SODIUM BICARBONATE 8.4 % IV SOLN
100.0000 meq | Freq: Once | INTRAVENOUS | Status: AC
  Administered 2016-10-31: 100 meq via INTRAVENOUS
  Filled 2016-10-31: qty 50

## 2016-10-31 NOTE — ED Triage Notes (Signed)
Per EMS: Pt was an unwitnessed fall from home. Family found him on the floor. No noticeable head trauma. Pt answered questions w/ EMS, but not in triage. Pulse was 34 w/ EMS. Pt given .5 mg of atropine and 500 fluid bolus. Two episodes of emesis w/ EMS.

## 2016-10-31 NOTE — ED Notes (Signed)
Report attempted 

## 2016-10-31 NOTE — ED Notes (Addendum)
Dr. Rosalia Hammersay and primary RN Windy Kalata(Yasemia) notified of Troponin of 0.13

## 2016-10-31 NOTE — H&P (Signed)
PULMONARY / CRITICAL CARE MEDICINE   Name: Jack Carter MRN: [id number] DOB: 04-21-1933    ADMISSION DATE:  10/31/2016 CONSULTATION DATE:  10/31/2016  REFERRING MD:  ED  CHIEF COMPLAINT:  Altered mental status  HISTORY OF PRESENT ILLNESS:   83 yr old male with Hx of parkinson disease, CAD s/p CABG, CHF who is coming after as per his daughter him being very weak in the last week. Today he was in the bathroom and she all of a sudden heard a thump and him screaming. He told her that he fell and maybe bumped his head but not hard. He did not lose consciousness. He tried to stand up again and he couldn't so she let him down and then shortly he started becoming unconscious.   As per daughter he has cellulitis of his legs and he has been feeling weak in the last week and though he might not be taking his medicines. He just moved from [place] so we dont have much info and she does not know much of his past either. On arrival to the ED was found to be severely bradycardic and hypotensive. IVC is full as per ED echo. Patient has K of more than 8 and is taking K and bactrim at home.   PAST MEDICAL HISTORY :  CHF CAD s/p CABG - parkinson  Hyperlipidemia   PAST SURGICAL HISTORY: He  has no past surgical history on file.  No known allergies    FAMILY HISTORY:  His has no family status information on file.   not significant to this admission   SOCIAL HISTORY: He just moved from [place] and lives with his daughter   REVIEW OF SYSTEMS:   Could not be obtained patient is on the vent   SUBJECTIVE:  obtunded  VITAL SIGNS: BP (!) 83/44   Pulse 65   Temp (!) 95.7 F (35.4 C)   Resp 18   Ht 5\' 9"  (1.753 m)   Wt 86.2 kg (190 lb)   SpO2 (!) 79%   BMI 28.06 kg/m   HEMODYNAMICS:    VENTILATOR SETTINGS: Vent Mode: PRVC FiO2 (%):  [100 %] 100 % Set Rate:  [18 bmp] 18 bmp Vt Set:  [580 mL] 580 mL PEEP:  [5 cmH20] 5 cmH20  INTAKE / OUTPUT: No intake/output data recorded.  PHYSICAL  EXAMINATION: General:  Obtunded  Neuro:  Obtunded  HEENT:  Intubated  Cardiovascular:  Bradycardic on dopaime drip  Lungs: clear equal air sounds bilaterally  Abdomen:  Soft n o tenderness no guarding  Musculoskeletal:  Bilateral lower limb cellulitis  Skin: see above  LABS:  BMET  Recent Labs Lab 10/31/16 1921 10/31/16 1928 10/31/16 2052  NA 134* 138 144  K >7.5* 8.2* 6.8*  CL 107 110 112*  CO2 20*  --   --   BUN 52* 47* 46*  CREATININE 2.99* 2.90* 2.90*  GLUCOSE 160* 151* 142*    Electrolytes  Recent Labs Lab 10/31/16 1921  CALCIUM 8.5*    CBC  Recent Labs Lab 10/31/16 1928 10/31/16 2052  HGB 12.6* 12.6*  HCT 37.0* 37.0*    Coag's No results for input(s): APTT, INR in the last 168 hours.  Sepsis Markers  Recent Labs Lab 10/31/16 1936  LATICACIDVEN 2.05*    ABG No results for input(s): PHART, PCO2ART, PO2ART in the last 168 hours.  ABG    Component Value Date/Time   TCO2 19 (L) 10/31/2016 2052   Liver Enzymes No results for input(s):  AST, ALT, ALKPHOS, BILITOT, ALBUMIN in the last 168 hours.  Cardiac Enzymes No results for input(s): TROPONINI, PROBNP in the last 168 hours.  Glucose  Recent Labs Lab 10/31/16 2026  GLUCAP 77    Imaging Dg Chest Portable 1 View  Result Date: 10/31/2016 CLINICAL DATA:  Post intubation. EXAM: PORTABLE CHEST 1 VIEW COMPARISON:  None. FINDINGS: An endotracheal tube is identified with tip 2.8 cm above the carina. An NG tube is identified entering the stomach with tip off the field of view. Defibrillator pads overlying the chest are noted. Cardiomegaly identified. No airspace disease, definite pleural effusion or pneumothorax noted. IMPRESSION: Support apparatus as described. Cardiomegaly without evidence of acute cardiopulmonary disease. Electronically Signed   By: Harmon PierJeffrey  Hu M.D.   On: 10/31/2016 19:59     STUDIES:  CT head >>  CULTURES: Blood Urine >>  ANTIBIOTICS: Zosyn vanc   SIGNIFICANT  EVENTS: Intubated 8/31  LINES/TUBES: femoral CVC   DISCUSSION: Patient is coming in with septic and cardiogenic shock secondary to cellulitis and hyperkalemia. Severe hyperkalemia from AKI, use of K and bactrim. Severe bradycardia from hyperkalemia. Acute hypoxemic respiratory failure. Severe metabolic encephalopathyh to rule out IC process   ASSESSMENT / PLAN:  PULMONARY A: acue hypoxemic resp failure requiring mechanical ventilation   P:   Wean FIO2 as tolerated   CARDIOVASCULAR A:  Cardiogenic shock secondary to severe bradycardia from hyperkalemia and septic shock from cellulitis  Known CAD s/p CABG    P:  Dopamine Start levophed Correct K Given 2.5 litres Echo  Cardio follow up  RENAL A:   Severe hyperkalemia from supplements, AKi an bactrim,  P:   Medical treatment for hyperkalemia Seen by nephro, no HD for now  Follow urine output   GASTROINTESTINAL A:   Upper GI bleeding ?stress vs traumatic   P:   - NPO NGT to suction PPI drip Serial CBC ? GI consult  HEMATOLOGIC A:   Mild anemia   P:  Serial CBC  INFECTIOUS A:   Cellulitis   P:   Zosyn vanc Follow cultures   ENDOCRINE A:   No issues  P:   Follow glucose    NEUROLOGIC A:   metabolci encephalopathy to r/o acute IC process since fall  P:   RASS goal: -2 Stat CT head    FAMILY  - Updates: spoke to family and they known his overall guarded condition. patient is now DNR but will continue aggressive care.   - Inter-disciplinary family meet or Palliative Care meeting due by:  9/7  I have spent 45 mins of CC time patient is needing ICU due to multiorgan failure requiring mechanical ventilation and pressors. This time exclusive of any billable procedures    Pulmonary and Critical Care Medicine [facility] Pager: [phone]  10/31/2016, 9:25 PM

## 2016-10-31 NOTE — ED Notes (Signed)
Sent label to main lab to add on hfp, trop, bnp

## 2016-10-31 NOTE — ED Notes (Signed)
2g or Mg given per Dr. Denny Levyay's order prior to intubation.

## 2016-10-31 NOTE — Consult Note (Signed)
Reason for Consult: Acute kidney injury, critical hyperkalemia Referring Physician: Silver Huguenin M.D (CCM)  HPI:  83 year old Caucasian man with past medical history significant for coronary artery disease, parkinsonism, hypertension, problems with volume overload and apparently normal renal function at baseline. Was in his usual state of health until after dinnertime today when he apparently turned blue around his lips and started developing altered level of consciousness. This was preceded by at least 1 or 2 days of some increased hand jerking movements. When brought into the emergency room, he was poorly responsive and unable to protect his airway leading to intubation. Found to be hypotensive after initially being hypertensive and started on pressor support with Levophed.  Concerned raised with elevated creatinine and hyperkalemia >7.5. Medication list reviewed and significant for ongoing potassium supplementation while on furosemide/metolazone, Diovan and recent Bactrim course. Temporizing measures for treating hyperkalemia have resulted in repeat potassium of 6.8.  His medical history is not quite clear as he was living in [place] up until about 2 months ago.  Past medical history: Parkinsonism Hypertension Coronary artery disease ? Congestive heart failure Dyslipidemia Restless leg syndrome  No past surgical history on file.  No family history on file.  Social History:  has no tobacco, alcohol, and drug history on file.  Allergies: No Known Allergies  Medications:  Scheduled: . calcium gluconate  1 g Intravenous Once  . dextrose  1 ampule Intravenous Once  . insulin aspart  10 Units Intravenous Once  . [START ON 11/04/2016] pantoprazole  40 mg Intravenous Q12H  . sodium bicarbonate  100 mEq Intravenous Once  . sodium polystyrene  60 g Per Tube Once    Results for orders placed or performed during the hospital encounter of 10/31/16 (from the past 48 hour(s))  Basic metabolic  panel     Status: Abnormal   Collection Time: 10/31/16  7:21 PM  Result Value Ref Range   Sodium 134 (L) 135 - 145 mmol/L   Potassium >7.5 (HH) 3.5 - 5.1 mmol/L    Comment: NO VISIBLE HEMOLYSIS CRITICAL RESULT CALLED TO, READ BACK BY AND VERIFIED WITH: C. DART RN [id number] GREEN R    Chloride 107 101 - 111 mmol/L   CO2 20 (L) 22 - 32 mmol/L   Glucose, Bld 160 (H) 65 - 99 mg/dL   BUN 52 (H) 6 - 20 mg/dL   Creatinine, Ser 2.99 (H) 0.61 - 1.24 mg/dL   Calcium 8.5 (L) 8.9 - 10.3 mg/dL   GFR calc non Af Amer 18 (L) >60 mL/min   GFR calc Af Amer 21 (L) >60 mL/min    Comment: (NOTE) The eGFR has been calculated using the CKD EPI equation. This calculation has not been validated in all clinical situations. eGFR's persistently <60 mL/min signify possible Chronic Kidney Disease.    Anion gap 7 5 - 15  I-stat chem 8, ed     Status: Abnormal   Collection Time: 10/31/16  7:28 PM  Result Value Ref Range   Sodium 138 135 - 145 mmol/L   Potassium 8.2 (HH) 3.5 - 5.1 mmol/L   Chloride 110 101 - 111 mmol/L   BUN 47 (H) 6 - 20 mg/dL   Creatinine, Ser 2.90 (H) 0.61 - 1.24 mg/dL   Glucose, Bld 151 (H) 65 - 99 mg/dL   Calcium, Ion 1.03 (L) 1.15 - 1.40 mmol/L   TCO2 20 (L) 22 - 32 mmol/L   Hemoglobin 12.6 (L) 13.0 - 17.0 g/dL   HCT 37.0 (L)  39.0 - 52.0 %   Comment NOTIFIED PHYSICIAN   I-Stat CG4 Lactic Acid, ED     Status: Abnormal   Collection Time: 10/31/16  7:36 PM  Result Value Ref Range   Lactic Acid, Venous 2.05 (HH) 0.5 - 1.9 mmol/L   Comment NOTIFIED PHYSICIAN   I-Stat Troponin, ED (not at [facility])     Status: Abnormal   Collection Time: 10/31/16  7:52 PM  Result Value Ref Range   Troponin i, poc 0.11 (HH) 0.00 - 0.08 ng/mL   Comment NOTIFIED PHYSICIAN    Comment 3            Comment: Due to the release kinetics of cTnI, a negative result within the first hours of the onset of symptoms does not rule out myocardial infarction with certainty. If myocardial infarction is still  suspected, repeat the test at appropriate intervals.   Urinalysis, Routine w reflex microscopic     Status: Abnormal   Collection Time: 10/31/16  8:20 PM  Result Value Ref Range   Color, Urine AMBER (A) YELLOW    Comment: BIOCHEMICALS MAY BE AFFECTED BY COLOR   APPearance HAZY (A) CLEAR   Specific Gravity, Urine 1.021 1.005 - 1.030   pH 5.0 5.0 - 8.0   Glucose, UA NEGATIVE NEGATIVE mg/dL   Hgb urine dipstick SMALL (A) NEGATIVE   Bilirubin Urine NEGATIVE NEGATIVE   Ketones, ur 5 (A) NEGATIVE mg/dL   Protein, ur 100 (A) NEGATIVE mg/dL   Nitrite NEGATIVE NEGATIVE   Leukocytes, UA NEGATIVE NEGATIVE   RBC / HPF 6-30 0 - 5 RBC/hpf   WBC, UA 0-5 0 - 5 WBC/hpf   Bacteria, UA RARE (A) NONE SEEN   Squamous Epithelial / LPF 0-5 (A) NONE SEEN   Mucus PRESENT    Hyaline Casts, UA PRESENT   CBG monitoring, ED     Status: None   Collection Time: 10/31/16  8:26 PM  Result Value Ref Range   Glucose-Capillary 77 65 - 99 mg/dL   Comment 1 Notify RN    Comment 2 Document in Chart   I-Stat Chem 8, ED     Status: Abnormal   Collection Time: 10/31/16  8:52 PM  Result Value Ref Range   Sodium 144 135 - 145 mmol/L   Potassium 6.8 (HH) 3.5 - 5.1 mmol/L   Chloride 112 (H) 101 - 111 mmol/L   BUN 46 (H) 6 - 20 mg/dL   Creatinine, Ser 2.90 (H) 0.61 - 1.24 mg/dL   Glucose, Bld 142 (H) 65 - 99 mg/dL   Calcium, Ion 1.18 1.15 - 1.40 mmol/L   TCO2 19 (L) 22 - 32 mmol/L   Hemoglobin 12.6 (L) 13.0 - 17.0 g/dL   HCT 37.0 (L) 39.0 - 52.0 %   Comment NOTIFIED PHYSICIAN     Dg Chest Portable 1 View  Result Date: 10/31/2016 CLINICAL DATA:  Post intubation. EXAM: PORTABLE CHEST 1 VIEW COMPARISON:  None. FINDINGS: An endotracheal tube is identified with tip 2.8 cm above the carina. An NG tube is identified entering the stomach with tip off the field of view. Defibrillator pads overlying the chest are noted. Cardiomegaly identified. No airspace disease, definite pleural effusion or pneumothorax noted. IMPRESSION:  Support apparatus as described. Cardiomegaly without evidence of acute cardiopulmonary disease. Electronically Signed   By: Margarette Canada M.D.   On: 10/31/2016 19:59    Review of Systems  Unable to perform ROS: Intubated   Blood pressure (!) 83/44, pulse 65, temperature (!)  95.7 F (35.4 C), resp. rate 18, height 5' 9" (1.753 m), weight 86.2 kg (190 lb), SpO2 (!) 79 %. Physical Exam  Nursing note and vitals reviewed. Constitutional: He appears well-developed and well-nourished.  Elderly-appearing man, intubated/sedated  HENT:  Head: Normocephalic and atraumatic.  Neck: No JVD present. No thyromegaly present.  Cardiovascular: Normal rate, regular rhythm and normal heart sounds.   No murmur heard. Respiratory: Effort normal. He has no wheezes. He has no rales.  GI: Soft. Bowel sounds are normal. There is no tenderness. There is no rebound.  Musculoskeletal: Normal range of motion. He exhibits edema.  1+ lower extremity edema with chronic venous stasis changes. Both legs in wraps.  Skin: Skin is warm and dry. No rash noted.    Assessment/Plan: 1. Acute kidney injury: Appears hemodynamically mediated with possible ATN associated with shock likely septic versus cardiogenic origin. Cultures drawn and pending-currently on broad-spectrum antibiotic therapy. Will check urine electrolytes-indication from initial urinalysis that this is an acute glomerulonephritis process. It may be entirely plausible that his elevated creatinine is from recent Bactrim use. Will check renal ultrasound to rule out any obstruction. 2. Hyperkalemia: Appears to be primarily iatrogenic from ongoing potassium supplementation with additional role from Diovan/Bactrim. Compounded further by recent development of acute kidney injury. Agree with medical management at this time including Kayexalate use. 3. Shock/hypothermia: Ongoing evaluation for etiology with differentials including cardiogenic shock versus sepsis. On Levophed  at this time and plans in place for fluid resuscitation.  4. Parkinsonism: Monitor currently off of his usual medications.  , K. 10/31/2016, 9:33 PM

## 2016-10-31 NOTE — Progress Notes (Signed)
Pharmacy Antibiotic Note  Jack Carter is a 83 y.o. male admitted on 10/31/2016 with sepsis.    Plan: Zosyn 3.375 gm Iv q8h Vanc 1500 mg x 1 F/u renal fx, cx Further vanc dosing based on renal fx  Height: 5\' 9"  (175.3 cm) Weight: 190 lb (86.2 kg) IBW/kg (Calculated) : 70.7  Temp (24hrs), Avg:95.6 F (35.3 C), Min:95 F (35 C), Max:96.9 F (36.1 C)   Recent Labs Lab 10/31/16 1921 10/31/16 1928 10/31/16 1936 10/31/16 2052  CREATININE 2.99* 2.90*  --  2.90*  LATICACIDVEN  --   --  2.05*  --     Estimated Creatinine Clearance: 21.7 mL/min (A) (by C-G formula based on SCr of 2.9 mg/dL (H)).    No Known Allergies  Isaac BlissMichael Alesia Oshields, PharmD, BCPS, BCCCP Clinical Pharmacist Clinical phone for 10/31/2016 from 7a-3:30p: [phone] If after 3:30p, please call main pharmacy at: [phone] 10/31/2016 9:51 PM

## 2016-10-31 NOTE — ED Provider Notes (Signed)
[facility]-EMERGENCY DEPT Provider Note   CSN: [id number] Arrival date & time: 10/31/16  1904     History   Chief Complaint Chief Complaint  Patient presents with  . Loss of Consciousness    HPI Jack Carter is a 83 y.o. male.  This is a 83 year old male with PMH of CAD status post 2 stent placement and currently on aspirin, CHF (unknown last EF), Parkinson disease who presents with unstable bradycardia from home.  He presents via EMS with intermittent bouts of altered mental status, he was bradycardic to the 30s at scene. Patient has no known history of kidney disease.  He has had issues in the past with volume overload.  Patient unable to provide history given his acuity of his condition and altered mental status.  Daughter provided much of historical information.  The past 2 days the daughter is noticed the patient having increased jerking movements of his hands and feet. No changes in medications.  The patient has bilateral ankle and foot wounds which he claims daily.  While cleaning his wound today he apparently had fallen.  The daughter stated he had no change in mental status or symptomatology after his fall and approximately 2 hours elapsed until he became cyanotic around his lips and had waxing mental status changes along with worsening him jerking movements.  She eventually called EMS who arrived and transported the patient to the ED for further evaluation.   The history is provided by a relative. The history is limited by the condition of the patient.   No past medical history on file.  Patient Active Problem List   Diagnosis Date Noted  . Septic shock (HCC) 10/31/2016  . Cellulitis 10/31/2016  . Hyperkalemia 10/31/2016  . AKI (acute kidney injury) (HCC) 10/31/2016  . Cardiogenic shock (HCC) 10/31/2016  . Acute hypoxemic respiratory failure (HCC) 10/31/2016  . CAD (coronary artery disease) of artery bypass graft 10/31/2016  . CHF (congestive heart failure) (HCC)  10/31/2016  . Parkinson disease (HCC) 10/31/2016  . Upper GI bleed 10/31/2016   No past surgical history on file.     Home Medications    Prior to Admission medications   Medication Sig Start Date End Date Taking? Authorizing Provider  amantadine (SYMMETREL) 100 MG capsule Take 100 mg by mouth 3 (three) times daily.   Yes [provider]  aspirin EC 81 MG tablet Take 81 mg by mouth daily.   Yes [provider]  atorvastatin (LIPITOR) 10 MG tablet Take 10 mg by mouth at bedtime.   Yes [provider]  carbidopa-levodopa (SINEMET IR) 25-100 MG tablet Take 1 tablet by mouth 2 (two) times daily.   Yes [provider]  carbidopa-levodopa-entacapone (STALEVO) 50-200-200 MG tablet Take 1 tablet by mouth 2 (two) times daily.   Yes [provider]  furosemide (LASIX) 40 MG tablet Take 80 mg by mouth daily.   Yes [provider]  gabapentin (NEURONTIN) 600 MG tablet Take 600 mg by mouth 4 (four) times daily.   Yes [provider]  metolazone (ZAROXOLYN) 5 MG tablet Take 5 mg by mouth every Monday, Wednesday, and Friday.   Yes [provider]  Multiple Vitamins-Minerals (CENTRUM SILVER 50+MEN) TABS Take 1 tablet by mouth daily.   Yes [provider]  omeprazole (PRILOSEC) 40 MG capsule Take 40 mg by mouth daily.   Yes [provider]  potassium chloride SA (K-DUR,KLOR-CON) 20 MEQ tablet Take 20-40 mEq by mouth See admin instructions. 40 mEq in  the morning and 20 mEq in the afternoon   Yes [provider]  primidone (MYSOLINE) 50 MG tablet Take 50 mg by mouth 2 (two) times daily.   Yes [provider]  propranolol (INDERAL) 60 MG tablet Take 60 mg by mouth 2 (two) times daily.   Yes [provider]  rOPINIRole (REQUIP) 4 MG tablet Take 4 mg by mouth 2 (two) times daily.   Yes [provider]  traMADol (ULTRAM) 50 MG tablet Take 50 mg by mouth 3 (three) times daily.   Yes  [provider]  trimethobenzamide (TIGAN) 300 MG capsule Take 300 mg by mouth 3 (three) times daily as needed for nausea/vomiting.   Yes [provider]  valsartan (DIOVAN) 160 MG tablet Take 160 mg by mouth 2 (two) times daily.   Yes [provider]    Family History No family history on file.  Social History Social History  Substance Use Topics  . Smoking status: Not on file  . Smokeless tobacco: Not on file  . Alcohol use Not on file     Allergies   Patient has no known allergies.   Review of Systems Review of Systems  Unable to perform ROS: Acuity of condition     Physical Exam Updated Vital Signs BP 103/66   Pulse 69   Temp (!) 97.3 F (36.3 C)   Resp 18   Ht 5\' 9"  (1.753 m)   Wt 86.2 kg (190 lb)   SpO2 93%   BMI 28.06 kg/m   Physical Exam  Constitutional: He appears lethargic. He has a sickly appearance. He appears distressed. Face mask in place.  HENT:  Head: Normocephalic and atraumatic.  Eyes: Pupils are equal, round, and reactive to light. Conjunctivae are normal.  Neck: Neck supple.  Cardiovascular: An irregular rhythm present. Bradycardia present.  Exam reveals decreased pulses.   No murmur heard. Pulses:      Radial pulses are 1+ on the right side, and 1+ on the left side.       Femoral pulses are 1+ on the right side, and 1+ on the left side.      Dorsalis pedis pulses are 1+ on the right side, and 1+ on the left side.  Pulmonary/Chest: Effort normal and breath sounds normal. No respiratory distress.  Abdominal: Soft. There is no tenderness.  Musculoskeletal: He exhibits no edema.  Neurological: He appears lethargic.  Unable to perform neurological examination given patient's acute condition with mental status change.  Skin: Skin is warm.  Psychiatric: He has a normal mood and affect.  Nursing note and vitals reviewed.   ED Treatments / Results  Labs (all labs ordered are listed, but only abnormal results are  displayed) Labs Reviewed  BASIC METABOLIC PANEL - Abnormal; Notable for the following:       Result Value   Sodium 134 (*)    Potassium >7.5 (*)    CO2 20 (*)    Glucose, Bld 160 (*)    BUN 52 (*)    Creatinine, Ser 2.99 (*)    Calcium 8.5 (*)    GFR calc non Af Amer 18 (*)    GFR calc Af Amer 21 (*)    All other components within normal limits  URINALYSIS, ROUTINE W REFLEX MICROSCOPIC - Abnormal; Notable for the following:    Color, Urine AMBER (*)    APPearance HAZY (*)    Hgb urine dipstick SMALL (*)    Ketones, ur 5 (*)  Protein, ur 100 (*)    Bacteria, UA RARE (*)    Squamous Epithelial / LPF 0-5 (*)    All other components within normal limits  HEPATIC FUNCTION PANEL - Abnormal; Notable for the following:    Albumin 3.2 (*)    AST 57 (*)    Alkaline Phosphatase 195 (*)    Total Bilirubin 1.3 (*)    All other components within normal limits  TROPONIN I - Abnormal; Notable for the following:    Troponin I 0.13 (*)    All other components within normal limits  APTT - Abnormal; Notable for the following:    aPTT 21 (*)    All other components within normal limits  CBC WITH DIFFERENTIAL/PLATELET - Abnormal; Notable for the following:    WBC 13.6 (*)    RBC 3.74 (*)    Hemoglobin 11.6 (*)    HCT 36.6 (*)    RDW 15.8 (*)    Neutro Abs 12.5 (*)    All other components within normal limits  BASIC METABOLIC PANEL - Abnormal; Notable for the following:    Potassium 7.0 (*)    Glucose, Bld 231 (*)    BUN 51 (*)    Creatinine, Ser 2.89 (*)    GFR calc non Af Amer 19 (*)    GFR calc Af Amer 22 (*)    All other components within normal limits  BRAIN NATRIURETIC PEPTIDE - Abnormal; Notable for the following:    B Natriuretic Peptide 205.9 (*)    All other components within normal limits  I-STAT TROPONIN, ED - Abnormal; Notable for the following:    Troponin i, poc 0.11 (*)    All other components within normal limits  I-STAT CG4 LACTIC ACID, ED - Abnormal; Notable for  the following:    Lactic Acid, Venous 2.05 (*)    All other components within normal limits  I-STAT CHEM 8, ED - Abnormal; Notable for the following:    Potassium 8.2 (*)    BUN 47 (*)    Creatinine, Ser 2.90 (*)    Glucose, Bld 151 (*)    Calcium, Ion 1.03 (*)    TCO2 20 (*)    Hemoglobin 12.6 (*)    HCT 37.0 (*)    All other components within normal limits  I-STAT CHEM 8, ED - Abnormal; Notable for the following:    Potassium 6.8 (*)    Chloride 112 (*)    BUN 46 (*)    Creatinine, Ser 2.90 (*)    Glucose, Bld 142 (*)    TCO2 19 (*)    Hemoglobin 12.6 (*)    HCT 37.0 (*)    All other components within normal limits  I-STAT CG4 LACTIC ACID, ED - Abnormal; Notable for the following:    Lactic Acid, Venous 4.11 (*)    All other components within normal limits  CULTURE, BLOOD (ROUTINE X 2)  CULTURE, BLOOD (ROUTINE X 2)  URINE CULTURE  PROTIME-INR  CBC WITH DIFFERENTIAL/PLATELET  CBC WITH DIFFERENTIAL/PLATELET  BASIC METABOLIC PANEL  BASIC METABOLIC PANEL  URINALYSIS, ROUTINE W REFLEX MICROSCOPIC  POTASSIUM  POTASSIUM  SODIUM, URINE, RANDOM  CBC WITH DIFFERENTIAL/PLATELET  CBC WITH DIFFERENTIAL/PLATELET  BASIC METABOLIC PANEL  BASIC METABOLIC PANEL  POTASSIUM  POTASSIUM  POTASSIUM  CBG MONITORING, ED  I-STAT ARTERIAL BLOOD GAS, ED    EKG  EKG Interpretation  Date/Time:  Friday October 31 2016 20:01:22 EDT Ventricular Rate:  47 PR Interval:  QRS Duration: 148 QT Interval:  444 QTC Calculation: 393 R Axis:   92 Text Interpretation:  Atrial fibrillation Nonspecific intraventricular conduction delay Probable anteroseptal infarct, old Confirmed by Margarita Grizzle [phone]) on 10/31/2016 8:31:56 PM       Radiology [place] Retroperitoneal (renal,aorta,ivc Nodes)  Result Date: 10/31/2016 CLINICAL DATA:  Acute renal injury EXAM: RENAL / URINARY TRACT ULTRASOUND COMPLETE COMPARISON:  None. FINDINGS: Right Kidney: Length: 10.1 cm. Echogenicity within normal limits. No mass  or hydronephrosis visualized. Left Kidney: Length: 10.6 cm. Echogenicity within normal limits. No mass or hydronephrosis visualized. Bladder: Decompressed by Foley catheter. IMPRESSION: Maintained cortical-medullary distinction within both kidneys without obstructive uropathy. Decompressed bladder with indwelling Foley catheter. Electronically Signed   By: Tollie Eth M.D.   On: 10/31/2016 22:25   Dg Chest Portable 1 View  Result Date: 10/31/2016 CLINICAL DATA:  Post intubation. EXAM: PORTABLE CHEST 1 VIEW COMPARISON:  None. FINDINGS: An endotracheal tube is identified with tip 2.8 cm above the carina. An NG tube is identified entering the stomach with tip off the field of view. Defibrillator pads overlying the chest are noted. Cardiomegaly identified. No airspace disease, definite pleural effusion or pneumothorax noted. IMPRESSION: Support apparatus as described. Cardiomegaly without evidence of acute cardiopulmonary disease. Electronically Signed   By: Harmon Pier M.D.   On: 10/31/2016 19:59    Procedures Procedure Name: Intubation Date/Time: 10/31/2016 11:46 PM Performed by: Shaune Pollack Pre-anesthesia Checklist: Patient identified, Emergency Drugs available, Suction available and Patient being monitored Oxygen Delivery Method: Non-rebreather mask Preoxygenation: Pre-oxygenation with 100% oxygen Induction Type: Rapid sequence Ventilation: Mask ventilation without difficulty Laryngoscope Size: Glidescope Grade View: Grade I Tube size: 7.5 mm Number of attempts: 1 Airway Equipment and Method: Patient positioned with wedge pillow and Rigid stylet Placement Confirmation: ETT inserted through vocal cords under direct vision,  Positive ETCO2,  CO2 detector and Breath sounds checked- equal and bilateral Tube secured with: ETT holder Difficulty Due To: Difficulty was unanticipated    .Central Line Date/Time: 10/31/2016 11:47 PM Performed by: Shaune Pollack Authorized by: Shaune Pollack    Consent:    Consent obtained:  Emergent situation Pre-procedure details:    Hand hygiene: Hand hygiene performed prior to insertion     Sterile barrier technique: All elements of maximal sterile technique followed     Skin preparation:  Betadine Procedure details:    Location:  R femoral   Site selection rationale:  Ease of access without [place] in an acute, critical care situation   Patient position:  Flat   Procedural supplies:  Triple lumen   Catheter size:  8.5 Fr   Landmarks identified: yes     Ultrasound guidance: no     Number of attempts:  1   Successful placement: yes   Post-procedure details:    Post-procedure:  Dressing applied and line sutured   Assessment:  Blood return through all ports and free fluid flow   Patient tolerance of procedure:  Tolerated well, no immediate complications   (including critical care time)  Medications Ordered in ED Medications  atropine injection (0.5 mg Intravenous Given 10/31/16 1959)  EPINEPHrine (ADRENALIN) 1 MG/10ML injection (1 mg Intravenous Given 10/31/16 1924)  etomidate (AMIDATE) injection (8 mg Intravenous Given 10/31/16 1927)  rocuronium (ZEMURON) injection (100 mg Intravenous Given 10/31/16 1930)  calcium chloride injection (1 g Intravenous Given 10/31/16 1933)  fentaNYL in NS (59mcg/ml) infusion-PREMIX (20 mcg/hr Intravenous New Bag/Given 10/31/16 1946)  dextrose 50 % solution (50 g Intravenous Given 10/31/16  1937)  sodium bicarbonate injection (50 mEq Intravenous Given 10/31/16 1950)  DOPamine (INTROPIN) 800 mg in dextrose 5 % 250 mL (3.2 mg/mL) infusion (20 mcg/kg/min  86.2 kg Intravenous Rate/Dose Change 10/31/16 2029)  0.9 %  sodium chloride infusion (not administered)  norepinephrine (LEVOPHED) 4 mg in dextrose 5 % 250 mL (0.016 mg/mL) infusion (not administered)  pantoprazole (PROTONIX) 80 mg in sodium chloride 0.9 % 250 mL (0.32 mg/mL) infusion (8 mg/hr Intravenous New Bag/Given 10/31/16 2254)  pantoprazole  (PROTONIX) injection 40 mg (not administered)  sodium polystyrene (KAYEXALATE) 15 GM/60ML suspension 60 g (not administered)  insulin aspart (novoLOG) 100 UNIT/ML injection (  Canceled Entry 10/31/16 2147)  piperacillin-tazobactam (ZOSYN) IVPB 3.375 g (0 g Intravenous Stopped 10/31/16 2255)    Followed by  piperacillin-tazobactam (ZOSYN) IVPB 3.375 g (not administered)  vancomycin (VANCOCIN) 1,500 mg in sodium chloride 0.9 % 500 mL IVPB (1,500 mg Intravenous New Bag/Given 10/31/16 2216)  insulin aspart (novoLOG) injection 10 Units (10 Units Intravenous Given 10/31/16 1937)  norepinephrine (LEVOPHED) 4 mg in dextrose 5 % 250 mL (0.016 mg/mL) infusion (5 mcg/min Intravenous New Bag/Given 10/31/16 2132)  calcium gluconate inj 10% (1 g) URGENT USE ONLY! (1 g Intravenous Given 10/31/16 2143)  insulin aspart (novoLOG) injection 10 Units (10 Units Intravenous Given 10/31/16 2142)  dextrose 50 % solution 50 mL (50 mLs Intravenous Given 10/31/16 2142)  sodium bicarbonate injection 100 mEq (100 mEq Intravenous Given 10/31/16 2143)  pantoprazole (PROTONIX) 80 mg in sodium chloride 0.9 % 100 mL IVPB (0 mg Intravenous Stopped 10/31/16 2253)     Initial Impression / Assessment and Plan / ED Course  I have reviewed the triage vital signs and the nursing notes.  Pertinent labs & imaging results that were available during my care of the patient were reviewed by me and considered in my medical decision making (see chart for details).     This is a 83 year old male with PMH of CAD status post 2 stent placement and currently on aspirin, CHF (unknown last EF), Parkinson disease who presents with unstable bradycardia from home.  He presents via EMS with intermittent bouts of altered mental status, he was bradycardic to the 30s at scene.  Upon arrival patient was intermittently lethargic and unresponsive with weak pulses.  Initial blood pressure was in the 100's systolic. It was determined he would need intubation for  airway protection and central line placement due to difficulty placing access and recognized need for critical care IV access. See procedure note above for details. Procedures performed without complication.  Initial potassium level 8.5 consistent with EKG changes.  Initial EKG showed wide rhythm with bradycardia and Sine waves.  Patient given 0.5 atropine in route, he was given another 1 of atropine upon arrival. Patient given calcium chloride through his central line along with sodium bicarbonate x2, 1 dose of epinephrine given his severe bradycardia and hypotension post intubation, 2 g magnesium, insulin and dextrose.  Lactate 2.5.  Initial troponin 0.11. IV fluids were begun but after 2 L were given bedside echocardiography performed by myself showed a dilated IVC with global reduction in heart function and no focal wall abnormalities.  IV fluids were halted given concern for CHF and fluid overload.  Patient's picture concerning for CHF exacerbation with developing cardiorenal syndrome given new rising creatinine at 2.89, potassium 8.2. Cardiogenic shock suspected.  Cardiology called to bedside who agreed patient did not require electrical pacing at this time and instead his electrolyte issues needed to be addressed.  Nephrology was consulted for bedside evaluation, critical care team was consulted for admission. Patient continued to become bradycardic and given drop in systolic blood pressure to 70s dopamine infusion was begun.  Spoke with daughter at bedside, all questions answered.  Final Clinical Impressions(s) / ED Diagnoses   Final diagnoses:  Hyperkalemia  End stage congestive heart failure (HCC)  Cardiogenic shock (HCC)  Bradycardia, sinus    New Prescriptions New Prescriptions   No medications on file     Shaune Pollack, MD 10/31/16 2349    Margarita Grizzle, MD 11/01/16 2237

## 2016-10-31 NOTE — Progress Notes (Signed)
   10/31/16 1935  Clinical Encounter Type  Visited With Patient and family together  Visit Type ED  Spiritual Encounters  Spiritual Needs Emotional  Stress Factors  Patient Stress Factors Health changes  Family Stress Factors Exhausted  Introduction to daughter as Pt undergoing procedures. [employer].

## 2016-11-01 ENCOUNTER — Encounter (HOSPITAL_COMMUNITY): Payer: Self-pay | Admitting: Nurse Practitioner

## 2016-11-01 LAB — CBC WITH DIFFERENTIAL/PLATELET
BASOS ABS: 0 10*3/uL (ref 0.0–0.1)
BASOS ABS: 0 10*3/uL (ref 0.0–0.1)
BASOS PCT: 0 %
BASOS PCT: 0 %
Basophils Absolute: 0 10*3/uL (ref 0.0–0.1)
Basophils Absolute: 0 10*3/uL (ref 0.0–0.1)
Basophils Relative: 0 %
Basophils Relative: 0 %
EOS ABS: 0 10*3/uL (ref 0.0–0.7)
EOS ABS: 0.2 10*3/uL (ref 0.0–0.7)
EOS PCT: 1 %
EOS PCT: 2 %
Eosinophils Absolute: 0.1 10*3/uL (ref 0.0–0.7)
Eosinophils Absolute: 0.2 10*3/uL (ref 0.0–0.7)
Eosinophils Relative: 0 %
Eosinophils Relative: 2 %
HCT: 35.7 % — ABNORMAL LOW (ref 39.0–52.0)
HCT: 35.5 % — ABNORMAL LOW (ref 39.0–52.0)
HEMATOCRIT: 41.4 % (ref 39.0–52.0)
HEMATOCRIT: 36.1 % — AB (ref 39.0–52.0)
HEMOGLOBIN: 11 g/dL — AB (ref 13.0–17.0)
Hemoglobin: 13.3 g/dL (ref 13.0–17.0)
Hemoglobin: 11.6 g/dL — ABNORMAL LOW (ref 13.0–17.0)
Hemoglobin: 11.5 g/dL — ABNORMAL LOW (ref 13.0–17.0)
Lymphocytes Relative: 10 %
Lymphocytes Relative: 11 %
Lymphocytes Relative: 13 %
Lymphocytes Relative: 10 %
Lymphs Abs: 1 10*3/uL (ref 0.7–4.0)
Lymphs Abs: 1 10*3/uL (ref 0.7–4.0)
Lymphs Abs: 1.5 10*3/uL (ref 0.7–4.0)
Lymphs Abs: 1.1 10*3/uL (ref 0.7–4.0)
MCH: 31 pg (ref 26.0–34.0)
MCH: 30.9 pg (ref 26.0–34.0)
MCH: 30.8 pg (ref 26.0–34.0)
MCH: 30.2 pg (ref 26.0–34.0)
MCHC: 32.1 g/dL (ref 30.0–36.0)
MCHC: 32.5 g/dL (ref 30.0–36.0)
MCHC: 31.9 g/dL (ref 30.0–36.0)
MCHC: 31 g/dL (ref 30.0–36.0)
MCV: 96.5 fL (ref 78.0–100.0)
MCV: 95.2 fL (ref 78.0–100.0)
MCV: 96.8 fL (ref 78.0–100.0)
MCV: 97.5 fL (ref 78.0–100.0)
MONO ABS: 1.2 10*3/uL — AB (ref 0.1–1.0)
MONO ABS: 1.3 10*3/uL — AB (ref 0.1–1.0)
MONO ABS: 1.1 10*3/uL — AB (ref 0.1–1.0)
MONOS PCT: 12 %
MONOS PCT: 16 %
MONOS PCT: 11 %
MONOS PCT: 10 %
Monocytes Absolute: 1.3 10*3/uL — ABNORMAL HIGH (ref 0.1–1.0)
NEUTROS ABS: 7.6 10*3/uL (ref 1.7–7.7)
NEUTROS ABS: 5.9 10*3/uL (ref 1.7–7.7)
NEUTROS ABS: 9 10*3/uL — AB (ref 1.7–7.7)
NEUTROS PCT: 72 %
NEUTROS PCT: 78 %
Neutro Abs: 8.4 10*3/uL — ABNORMAL HIGH (ref 1.7–7.7)
Neutrophils Relative %: 78 %
Neutrophils Relative %: 74 %
PLATELETS: 162 10*3/uL (ref 150–400)
PLATELETS: 136 10*3/uL — AB (ref 150–400)
PLATELETS: 155 10*3/uL (ref 150–400)
Platelets: 153 10*3/uL (ref 150–400)
RBC: 4.29 MIL/uL (ref 4.22–5.81)
RBC: 3.75 MIL/uL — ABNORMAL LOW (ref 4.22–5.81)
RBC: 3.73 MIL/uL — ABNORMAL LOW (ref 4.22–5.81)
RBC: 3.64 MIL/uL — ABNORMAL LOW (ref 4.22–5.81)
RDW: 15.8 % — AB (ref 11.5–15.5)
RDW: 15.6 % — AB (ref 11.5–15.5)
RDW: 16.1 % — AB (ref 11.5–15.5)
RDW: 16.4 % — AB (ref 11.5–15.5)
WBC: 9.8 10*3/uL (ref 4.0–10.5)
WBC: 8.3 10*3/uL (ref 4.0–10.5)
WBC: 12 10*3/uL — ABNORMAL HIGH (ref 4.0–10.5)
WBC: 10.8 10*3/uL — ABNORMAL HIGH (ref 4.0–10.5)

## 2016-11-01 LAB — URINALYSIS, ROUTINE W REFLEX MICROSCOPIC
BACTERIA UA: NONE SEEN
BILIRUBIN URINE: NEGATIVE
GLUCOSE, UA: NEGATIVE mg/dL
KETONES UR: NEGATIVE mg/dL
LEUKOCYTES UA: NEGATIVE
Nitrite: NEGATIVE
PROTEIN: 100 mg/dL — AB
Specific Gravity, Urine: 1.019 (ref 1.005–1.030)
pH: 5 (ref 5.0–8.0)

## 2016-11-01 LAB — SODIUM, URINE, RANDOM: Sodium, Ur: 36 mmol/L

## 2016-11-01 LAB — POTASSIUM
POTASSIUM: 5.5 mmol/L — AB (ref 3.5–5.1)
Potassium: >7.5 mmol/L (ref 3.5–5.1)
Potassium: 6.9 mmol/L (ref 3.5–5.1)

## 2016-11-01 LAB — GLUCOSE, CAPILLARY
GLUCOSE-CAPILLARY: 130 mg/dL — AB (ref 65–99)
GLUCOSE-CAPILLARY: 181 mg/dL — AB (ref 65–99)
GLUCOSE-CAPILLARY: 86 mg/dL (ref 65–99)
Glucose-Capillary: 123 mg/dL — ABNORMAL HIGH (ref 65–99)
Glucose-Capillary: 85 mg/dL (ref 65–99)
Glucose-Capillary: 91 mg/dL (ref 65–99)

## 2016-11-01 LAB — BASIC METABOLIC PANEL
ANION GAP: 10 (ref 5–15)
Anion gap: 9 (ref 5–15)
Anion gap: 7 (ref 5–15)
BUN: 50 mg/dL — AB (ref 6–20)
BUN: 47 mg/dL — AB (ref 6–20)
BUN: 44 mg/dL — ABNORMAL HIGH (ref 6–20)
CALCIUM: 8.9 mg/dL (ref 8.9–10.3)
CALCIUM: 8.3 mg/dL — AB (ref 8.9–10.3)
CALCIUM: 8.2 mg/dL — AB (ref 8.9–10.3)
CO2: 23 mmol/L (ref 22–32)
CO2: 24 mmol/L (ref 22–32)
CO2: 24 mmol/L (ref 22–32)
CREATININE: 3.15 mg/dL — AB (ref 0.61–1.24)
Chloride: 109 mmol/L (ref 101–111)
Chloride: 113 mmol/L — ABNORMAL HIGH (ref 101–111)
Chloride: 113 mmol/L — ABNORMAL HIGH (ref 101–111)
Creatinine, Ser: 3.09 mg/dL — ABNORMAL HIGH (ref 0.61–1.24)
Creatinine, Ser: 3.02 mg/dL — ABNORMAL HIGH (ref 0.61–1.24)
GFR calc Af Amer: 20 mL/min — ABNORMAL LOW (ref >60)
GFR calc Af Amer: 21 mL/min — ABNORMAL LOW (ref >60)
GFR calc non Af Amer: 18 mL/min — ABNORMAL LOW (ref >60)
GFR calc non Af Amer: 17 mL/min — ABNORMAL LOW (ref >60)
GFR, EST AFRICAN AMERICAN: 20 mL/min — AB (ref >60)
GFR, EST NON AFRICAN AMERICAN: 17 mL/min — AB (ref >60)
GLUCOSE: 243 mg/dL — AB (ref 65–99)
GLUCOSE: 98 mg/dL (ref 65–99)
Glucose, Bld: 126 mg/dL — ABNORMAL HIGH (ref 65–99)
POTASSIUM: 5.5 mmol/L — AB (ref 3.5–5.1)
Potassium: 7.3 mmol/L (ref 3.5–5.1)
Potassium: 5.2 mmol/L — ABNORMAL HIGH (ref 3.5–5.1)
SODIUM: 147 mmol/L — AB (ref 135–145)
Sodium: 141 mmol/L (ref 135–145)
Sodium: 144 mmol/L (ref 135–145)

## 2016-11-01 LAB — POCT I-STAT 3, ART BLOOD GAS (G3+)
ACID-BASE EXCESS: 1 mmol/L (ref 0.0–2.0)
BICARBONATE: 24.5 mmol/L (ref 20.0–28.0)
O2 SAT: 100 %
PH ART: 7.461 — AB (ref 7.350–7.450)
PO2 ART: 402 mmHg — AB (ref 83.0–108.0)
TCO2: 26 mmol/L (ref 22–32)
pCO2 arterial: 34.3 mmHg (ref 32.0–48.0)

## 2016-11-01 LAB — MRSA PCR SCREENING: MRSA by PCR: NEGATIVE

## 2016-11-01 MED ORDER — DOPAMINE-DEXTROSE 3.2-5 MG/ML-% IV SOLN
INTRAVENOUS | Status: AC
  Administered 2016-11-01: 800 mg
  Filled 2016-11-01: qty 250

## 2016-11-01 MED ORDER — DEXMEDETOMIDINE HCL IN NACL 400 MCG/100ML IV SOLN
0.4000 ug/kg/h | INTRAVENOUS | Status: DC
  Administered 2016-11-02: 0.4 ug/kg/h via INTRAVENOUS
  Filled 2016-11-01: qty 100

## 2016-11-01 MED ORDER — SODIUM BICARBONATE 8.4 % IV SOLN
50.0000 meq | Freq: Once | INTRAVENOUS | Status: AC
  Administered 2016-11-01: 50 meq via INTRAVENOUS
  Filled 2016-11-01: qty 50

## 2016-11-01 MED ORDER — CALCIUM GLUCONATE 10 % IV SOLN: 1.0000 g | Freq: Once | INTRAVENOUS | Status: DC

## 2016-11-01 MED ORDER — SODIUM CHLORIDE 0.9 % IV SOLN: INTRAVENOUS | Status: DC | PRN

## 2016-11-01 MED ORDER — DEXTROSE 50 % IV SOLN
1.0000 | Freq: Once | INTRAVENOUS | Status: AC
  Administered 2016-11-01: 50 mL via INTRAVENOUS
  Filled 2016-11-01: qty 50

## 2016-11-01 MED ORDER — FENTANYL CITRATE (PF) 100 MCG/2ML IJ SOLN: 50.0000 ug | INTRAMUSCULAR | Status: DC | PRN

## 2016-11-01 MED ORDER — DEXMEDETOMIDINE HCL 200 MCG/2ML IV SOLN
0.4000 ug/kg/h | INTRAVENOUS | Status: DC
  Administered 2016-11-01 (×2): 0.4 ug/kg/h via INTRAVENOUS
  Filled 2016-11-01 (×3): qty 2

## 2016-11-01 MED ORDER — CARBIDOPA-LEVODOPA 25-100 MG PO TABS
1.0000 | ORAL_TABLET | Freq: Three times a day (TID) | ORAL | Status: DC
  Administered 2016-11-01 – 2016-11-03 (×7): 1
  Filled 2016-11-01 (×10): qty 1

## 2016-11-01 MED ORDER — SODIUM POLYSTYRENE SULFONATE 15 GM/60ML PO SUSP
60.0000 g | Freq: Once | ORAL | Status: AC
  Administered 2016-11-01: 60 g
  Filled 2016-11-01: qty 240

## 2016-11-01 MED ORDER — AMANTADINE HCL 50 MG/5ML PO SYRP
100.0000 mg | ORAL_SOLUTION | Freq: Three times a day (TID) | ORAL | Status: DC
  Administered 2016-11-01 – 2016-11-03 (×6): 100 mg
  Filled 2016-11-01 (×9): qty 10

## 2016-11-01 MED ORDER — INSULIN ASPART 100 UNIT/ML IV SOLN
10.0000 [IU] | Freq: Once | INTRAVENOUS | Status: AC
  Administered 2016-11-01: 10 [IU] via INTRAVENOUS

## 2016-11-01 MED ORDER — SODIUM CHLORIDE 0.9 % IV SOLN
1.0000 g | Freq: Once | INTRAVENOUS | Status: AC
  Administered 2016-11-01: 1 g via INTRAVENOUS
  Filled 2016-11-01: qty 10

## 2016-11-01 MED ORDER — PRIMIDONE 50 MG PO TABS
50.0000 mg | ORAL_TABLET | Freq: Two times a day (BID) | ORAL | Status: DC
  Administered 2016-11-01 – 2016-11-18 (×32): 50 mg via ORAL
  Filled 2016-11-01 (×38): qty 1

## 2016-11-01 MED ORDER — FENTANYL 2500MCG IN NS 250ML (10MCG/ML) PREMIX INFUSION
0.0000 ug/h | INTRAVENOUS | Status: DC
  Administered 2016-11-01 – 2016-11-02 (×2): 250 ug/h via INTRAVENOUS
  Filled 2016-11-01 (×2): qty 250

## 2016-11-01 MED ORDER — PIPERACILLIN-TAZOBACTAM IN DEX 2-0.25 GM/50ML IV SOLN
2.2500 g | Freq: Three times a day (TID) | INTRAVENOUS | Status: DC
  Administered 2016-11-01 – 2016-11-02 (×3): 2.25 g via INTRAVENOUS
  Filled 2016-11-01 (×4): qty 50

## 2016-11-01 MED ORDER — FREE WATER
200.0000 mL | Status: DC
  Administered 2016-11-01 – 2016-11-02 (×3): 200 mL

## 2016-11-01 MED ORDER — SODIUM CHLORIDE 0.9 % IV SOLN: INTRAVENOUS | Status: DC

## 2016-11-01 MED ORDER — VANCOMYCIN HCL IN DEXTROSE 1-5 GM/200ML-% IV SOLN: 1000.0000 mg | INTRAVENOUS | Status: DC

## 2016-11-01 MED FILL — Medication: Qty: 1 | Status: AC

## 2016-11-01 NOTE — Progress Notes (Signed)
Patient ID: Jack Carter, male   DOB: 06/22/1933, 83 y.o.   MRN: [id number] [facility] Progress Note   Assessment/ Plan:   1. Acute kidney injury (Unknown renal baseline): Likely ATN associated with shock/renal hypoperfusion. He remains oliguric at this time and without any obvious renal recovery while on pressors. Elevated creatinine suspected to be in part from Bactrim use. Renal ultrasound without evidence of obstruction. Urine electrolytes pending and initial urinalysis not suggestive of acute GN. 2. Hyperkalemia:  likely a combination of iatrogenic (potassium supplement, ARB, Bactrim) as well as the impact of acute kidney injury on potassium handling. We'll continue efforts at medical management by re-dosing Kayexalate and subsequently monitoring potassium levels. 3. Shock/hypothermia:  remains on pressors at this time and on empiric antibiotic therapy while culture results are pending.  4. Parkinsonism: Significant tremor noted, will likely need restarting of his outpatient medications.  Subjective:   No acute events overnight-rebound of potassium level noted prompting additional dose of Kayexalate last night.    Objective:   BP 114/70   Pulse 66   Temp 99.3 F (37.4 C)   Resp 19   Ht 5\' 9"  (1.753 m)   Wt 86.3 kg (190 lb 4.1 oz)   SpO2 100%   BMI 28.10 kg/m   Intake/Output Summary (Last 24 hours) at 11/01/16 0805 Last data filed at 11/01/16 0700  Gross per 24 hour  Intake          3314.53 ml  Output              300 ml  Net          3014.53 ml   Weight change:   Physical Exam: [id number], awake, attempt to track when called out his name. Tremor noted. CVS: Pulse regular rhythm, normal rate, S1 and S2 normal Resp: Coarse breath sounds bilaterally, no distinct rales/rhonchi Abd: Soft, obese, nontender Ext: Trace-1+ edema with chronic venous stasis changes.  Imaging: Ct Head Wo Contrast  Result Date: 11/01/2016 CLINICAL DATA:  83 y/o M; unwitnessed  fall at home. No noticeable head trauma. EXAM: CT HEAD WITHOUT CONTRAST TECHNIQUE: Contiguous axial images were obtained from the base of the skull through the vertex without intravenous contrast. COMPARISON:  None. FINDINGS: Brain: No evidence of acute infarction, hemorrhage, hydrocephalus, extra-axial collection or mass lesion/mass effect. Few hospice foci of hypoattenuation in subcortical and periventricular white matter are compatible with mild chronic microvascular ischemic changes. Moderate diffuse parenchymal volume loss. Vascular: Calcific atherosclerosis of carotid siphons. No hyperdense vessel identified. Skull: Normal. Negative for fracture or focal lesion. Sinuses/Orbits: Paranasal sinus mucosal thickening and fluid within the nasopharynx is likely due to intubation. Chronic inspissated opacification of the right maxillary sinus. Normal aeration of the mastoid air cells. Debris in external auditory canals, likely cerumen. Other: Choose IMPRESSION: 1. No acute intracranial abnormality identified. No calvarial fracture. 2. Mild chronic microvascular ischemic changes and moderate parenchymal volume loss of the brain. 3. Chronic inspissated opacification of right maxillary sinus. Electronically Signed   By: Mitzi Hansen M.D.   On: 11/01/2016 00:30   [place] Retroperitoneal (renal,aorta,ivc Nodes)  Result Date: 10/31/2016 CLINICAL DATA:  Acute renal injury EXAM: RENAL / URINARY TRACT ULTRASOUND COMPLETE COMPARISON:  None. FINDINGS: Right Kidney: Length: 10.1 cm. Echogenicity within normal limits. No mass or hydronephrosis visualized. Left Kidney: Length: 10.6 cm. Echogenicity within normal limits. No mass or hydronephrosis visualized. Bladder: Decompressed by Foley catheter. IMPRESSION: Maintained cortical-medullary distinction within both kidneys without obstructive uropathy. Decompressed bladder with  indwelling Foley catheter. Electronically Signed   By: Tollie Ethavid  Kwon M.D.   On: 10/31/2016 22:25    Dg Chest Portable 1 View  Result Date: 10/31/2016 CLINICAL DATA:  Post intubation. EXAM: PORTABLE CHEST 1 VIEW COMPARISON:  None. FINDINGS: An endotracheal tube is identified with tip 2.8 cm above the carina. An NG tube is identified entering the stomach with tip off the field of view. Defibrillator pads overlying the chest are noted. Cardiomegaly identified. No airspace disease, definite pleural effusion or pneumothorax noted. IMPRESSION: Support apparatus as described. Cardiomegaly without evidence of acute cardiopulmonary disease. Electronically Signed   By: Harmon PierJeffrey  Hu M.D.   On: 10/31/2016 19:59    Labs: BMET  Recent Labs Lab 10/31/16 1921 10/31/16 1928 10/31/16 2052 10/31/16 2202 11/01/16 0031 11/01/16 0305 11/01/16 0616  NA 134* 138 144 138  --  141  --   K >7.5* 8.2* 6.8* 7.0* >7.5* 7.3* 6.9*  CL 107 110 112* 107  --  109  --   CO2 20*  --   --  24  --  23  --   GLUCOSE 160* 151* 142* 231*  --  243*  --   BUN 52* 47* 46* 51*  --  50*  --   CREATININE 2.99* 2.90* 2.90* 2.89*  --  3.09*  --   CALCIUM 8.5*  --   --  9.1  --  8.9  --    CBC  Recent Labs Lab 10/31/16 1928 10/31/16 2052 10/31/16 2202 11/01/16 0305  WBC  --   --  13.6* 9.8  NEUTROABS  --   --  12.5* 7.6  HGB 12.6* 12.6* 11.6* 13.3  HCT 37.0* 37.0* 36.6* 41.4  MCV  --   --  97.9 96.5  PLT  --   --  158 162    Medications:    . insulin aspart      . [START ON 11/04/2016] pantoprazole  40 mg Intravenous Q12H  . sodium polystyrene  60 g Per Tube Once   Zetta BillsJay Sakai Wolford, MD 11/01/2016, 8:05 AM

## 2016-11-01 NOTE — Progress Notes (Addendum)
PULMONARY / CRITICAL CARE MEDICINE   Name: Jack Carter MRN: [id number] DOB: 12/04/1933    ADMISSION DATE:  10/31/2016 CONSULTATION DATE:  10/31/2016  REFERRING MD:  ED  CHIEF COMPLAINT:  Altered mental status  HISTORY OF PRESENT ILLNESS:   83 yr old male with Hx of parkinson disease, CAD s/p CABG, CHF who is coming after as per his daughter him being very weak in the last week. Today he was in the bathroom and she all of a sudden heard a thump and him screaming. He told her that he fell and maybe bumped his head but not hard. He did not lose consciousness. He tried to stand up again and he couldn't so she let him down and then shortly he started becoming unconscious.   As per daughter he has cellulitis of his legs and he has been feeling weak in the last week and though he might not be taking his medicines. He just moved from [place] so we dont have much info and she does not know much of his past either. On arrival to the ED was found to be severely bradycardic and hypotensive. IVC is full as per ED echo. Patient has K of more than 8 and is taking K and bactrim at home.    SUBJECTIVE:  Seen by renal.   VITAL SIGNS: BP 114/70   Pulse 66   Temp 99.3 F (37.4 C)   Resp 19   Ht 5\' 9"  (1.753 m)   Wt 190 lb 4.1 oz (86.3 kg)   SpO2 100%   BMI 28.10 kg/m   HEMODYNAMICS:    VENTILATOR SETTINGS: Vent Mode: PRVC FiO2 (%):  [30 %-100 %] 30 % Set Rate:  [18 bmp] 18 bmp Vt Set:  [560 mL-580 mL] 560 mL PEEP:  [5 cmH20] 5 cmH20 Plateau Pressure:  [17 cmH20-20 cmH20] 20 cmH20  INTAKE / OUTPUT: I/O last 3 completed shifts: In: 3314.5 [I.V.:3164.5; IV Piggyback:150] Out: 300 [Urine:300]  PHYSICAL EXAMINATION: General appearance:  Chronically ill appearing 83 Year old  Male,  well nourished  NAD Eyes: anicteric sclerae, moist conjunctivae; PERRL, EOMI bilaterally. Mouth:  membranes and no mucosal ulcerations; orally intubated Neck: Trachea midline; neck supple, no JVD Lungs/chest:  clear , with normal respiratory effort and no intercostal retractions CV: RRR, no MRGs  Abdomen: Soft,distended + bowel sounds Extremities: + lower extremity erythremia trace LE edema   Skin: Normal temperature, turgor and texture; no rash, ulcers or subcutaneous nodules Neuro/Psych:sedated. Follows commands. + tremor  LABS:  BMET  Recent Labs Lab 10/31/16 1921  10/31/16 2052 10/31/16 2202 11/01/16 0031 11/01/16 0305 11/01/16 0616  NA 134*  < > 144 138  --  141  --   K >7.5*  < > 6.8* 7.0* >7.5* 7.3* 6.9*  CL 107  < > 112* 107  --  109  --   CO2 20*  --   --  24  --  23  --   BUN 52*  < > 46* 51*  --  50*  --   CREATININE 2.99*  < > 2.90* 2.89*  --  3.09*  --   GLUCOSE 160*  < > 142* 231*  --  243*  --   < > = values in this interval not displayed.  Electrolytes  Recent Labs Lab 10/31/16 1921 10/31/16 2202 11/01/16 0305  CALCIUM 8.5* 9.1 8.9    CBC  Recent Labs Lab 10/31/16 2202 11/01/16 0305 11/01/16 0923  WBC 13.6* 9.8 8.3  HGB 11.6* 13.3 11.6*  HCT 36.6* 41.4 35.7*  PLT 158 162 136*    Coag's  Recent Labs Lab 10/31/16 1921  APTT 21*  INR 1.10    Sepsis Markers  Recent Labs Lab 10/31/16 1936 10/31/16 2230  LATICACIDVEN 2.05* 4.11*    ABG  Recent Labs Lab 11/01/16 0225  PHART 7.461*  PCO2ART 34.3  PO2ART 402.0*    ABG    Component Value Date/Time   PHART 7.461 (H) 11/01/2016 0225   PCO2ART 34.3 11/01/2016 0225   PO2ART 402.0 (H) 11/01/2016 0225   HCO3 24.5 11/01/2016 0225   TCO2 26 11/01/2016 0225   O2SAT 100.0 11/01/2016 0225   Liver Enzymes  Recent Labs Lab 10/31/16 1921  AST 57*  ALT 27  ALKPHOS 195*  BILITOT 1.3*  ALBUMIN 3.2*    Cardiac Enzymes  Recent Labs Lab 10/31/16 1921  TROPONINI 0.13*    Glucose  Recent Labs Lab 10/31/16 2026 11/01/16 0017 11/01/16 0523 11/01/16 0723  GLUCAP 77 130* 181* 123*    Imaging Ct Head Wo Contrast  Result Date: 11/01/2016 CLINICAL DATA:  83 y/o M; unwitnessed  fall at home. No noticeable head trauma. EXAM: CT HEAD WITHOUT CONTRAST TECHNIQUE: Contiguous axial images were obtained from the base of the skull through the vertex without intravenous contrast. COMPARISON:  None. FINDINGS: Brain: No evidence of acute infarction, hemorrhage, hydrocephalus, extra-axial collection or mass lesion/mass effect. Few hospice foci of hypoattenuation in subcortical and periventricular white matter are compatible with mild chronic microvascular ischemic changes. Moderate diffuse parenchymal volume loss. Vascular: Calcific atherosclerosis of carotid siphons. No hyperdense vessel identified. Skull: Normal. Negative for fracture or focal lesion. Sinuses/Orbits: Paranasal sinus mucosal thickening and fluid within the nasopharynx is likely due to intubation. Chronic inspissated opacification of the right maxillary sinus. Normal aeration of the mastoid air cells. Debris in external auditory canals, likely cerumen. Other: Choose IMPRESSION: 1. No acute intracranial abnormality identified. No calvarial fracture. 2. Mild chronic microvascular ischemic changes and moderate parenchymal volume loss of the brain. 3. Chronic inspissated opacification of right maxillary sinus. Electronically Signed   By: Mitzi Hansen M.D.   On: 11/01/2016 00:30   [place] Retroperitoneal (renal,aorta,ivc Nodes)  Result Date: 10/31/2016 CLINICAL DATA:  Acute renal injury EXAM: RENAL / URINARY TRACT ULTRASOUND COMPLETE COMPARISON:  None. FINDINGS: Right Kidney: Length: 10.1 cm. Echogenicity within normal limits. No mass or hydronephrosis visualized. Left Kidney: Length: 10.6 cm. Echogenicity within normal limits. No mass or hydronephrosis visualized. Bladder: Decompressed by Foley catheter. IMPRESSION: Maintained cortical-medullary distinction within both kidneys without obstructive uropathy. Decompressed bladder with indwelling Foley catheter. Electronically Signed   By: Tollie Eth M.D.   On: 10/31/2016 22:25    Dg Chest Portable 1 View  Result Date: 10/31/2016 CLINICAL DATA:  Post intubation. EXAM: PORTABLE CHEST 1 VIEW COMPARISON:  None. FINDINGS: An endotracheal tube is identified with tip 2.8 cm above the carina. An NG tube is identified entering the stomach with tip off the field of view. Defibrillator pads overlying the chest are noted. Cardiomegaly identified. No airspace disease, definite pleural effusion or pneumothorax noted. IMPRESSION: Support apparatus as described. Cardiomegaly without evidence of acute cardiopulmonary disease. Electronically Signed   By: Harmon Pier M.D.   On: 10/31/2016 19:59     STUDIES:  CT head >>1. No acute intracranial abnormality identified. No calvarial fracture. 2. Mild chronic microvascular ischemic changes and moderate parenchymal volume loss of the brain. 3. Chronic inspissated opacification of right maxillary sinus.  CULTURES: Blood 8/31>>>  Urine 8/31>>>  ANTIBIOTICS: Zosyn vanc 8/31>>>  SIGNIFICANT EVENTS: Intubated 8/31  LINES/TUBES: femoral CVC   DISCUSSION: Patient is coming in with septic and cardiogenic shock secondary to cellulitis and hyperkalemia. Severe hyperkalemia from AKI, use of K and bactrim. Severe bradycardia from hyperkalemia. Acute hypoxemic respiratory failure. Severe metabolic encephalopathy.  Plan Cont to rx hyperkalemia Cont MIVFs (his CXR is clear) Wean levo/dc dopamine Dc ppi gtt. No evidence of bleeding-->i think it was traumatic.  F/u serial chem Hope to avoid HD  ASSESSMENT / PLAN:  PULMONARY A: acue hypoxemic resp failure requiring mechanical ventilation  PCXR personally reviewed: support ETT good position. No acute processes noted.  ->abg reviewed P:   Cont full vent support Wean FIO2 F/u cxr and ABG in am PAD protocol RAS -1  CARDIOVASCULAR A:  Cardiogenic shock secondary to severe bradycardia from hyperkalemia and septic shock from cellulitis  Known CAD s/p CABG  Trop elevation  P:  Cont  tele Wean levo for MAP > 65 Cont MIVFs Correct K F/u echo Trend trop  Will place Flow track via a line to better assess vol status   RENAL A:   Severe hyperkalemia from supplements, AKI and bactrim K still elevated but improved some. Seen by nephrology. RUS neg for obstruction. Renal fxn a little worse.  P:   Cont Kayexalate  Serial chemistries  Cont IVFs Additional recs per nephro   GASTROINTESTINAL A:   Upper GI bleeding ?stress vs traumatic -->favoring traumatic as no bloody output at this point on NG sxn P:   NPO Clamp NGT Change PPI to BID Tube feeds in am   HEMATOLOGIC A:   Mild anemia -->no current evidence of bleeding  P:  Trend cbc Hold ac  Transfuse per protocol  INFECTIOUS A:   Cellulitis   P:   Cont zosyn and vanc 8/31>>>  ENDOCRINE A:   No issues  P:   Follow glucose    NEUROLOGIC A:   Metabolic encephalopathy  H/o PD P:   PAD protocol RAS -1 Add back PD meds (will need to find out his regimen at home; has both stalevo and sinemet IR-->guess he takes both? ) I've asked pharm to assist    FAMILY  - Updates: spoke to family and they known his overall guarded condition. patient is now DNR but will continue aggressive care.   - Inter-disciplinary family meet or Palliative Care meeting due by:  9/7   My cct 43 min  Simonne Martinet ACNP-BC [facility] Pulmonary/Critical Care Pager # [phone] OR # [phone] if no answer  11/01/2016, 9:52 AM

## 2016-11-01 NOTE — Progress Notes (Signed)
eLink Physician-Brief Progress Note Patient Name: Midge MiniumCharles L Dalto DOB: 05/17/1933 MRN: [id number]   Date of Service  11/01/2016  HPI/Events of Note  Hyperkalemia persists  eICU Interventions  D50 / insulin Bicarb x 1 Calcium x 1 Kayexalate given this time        Yanessa Hocevar V. 11/01/2016, 2:03 AM

## 2016-11-01 NOTE — Procedures (Signed)
Arterial Catheter Insertion Procedure Note Midge MiniumCharles L Torok [id number] 01/08/1934  Procedure: Insertion of Arterial Catheter  Indications: Blood pressure monitoring  Procedure Details Consent: Risks of procedure as well as the alternatives and risks of each were explained to the (patient/caregiver).  Consent for procedure obtained. Time Out: Verified patient identification, verified procedure, site/side was marked, verified correct patient position, special equipment/implants available, medications/allergies/relevent history reviewed, required imaging and test results available.  Performed   Maximum sterile technique was used including cap. Skin prep: Chlorhexidine; local anesthetic administered 20 gauge catheter was inserted into right radial artery using the Seldinger technique.  Evaluation Blood flow good; BP tracing good. Complications: No apparent complications.     Ronny FlurryMorgan B Makaia Rappa 11/01/2016

## 2016-11-01 NOTE — Progress Notes (Signed)
eLink Physician-Brief Progress Note Patient Name: Jack Carter DOB: 04/14/1933 MRN: [id number]   Date of Service  11/01/2016  HPI/Events of Note  Nurse reporting diarrhea with Kayexalate.   eICU Interventions  Rectal fecal management system ordered.      Intervention Category Intermediate Interventions: Other:  Lawanda CousinsJennings Nestor 11/01/2016, 8:21 PM

## 2016-11-01 NOTE — Progress Notes (Signed)
Initial Nutrition Assessment  DOCUMENTATION CODES:  Not applicable  INTERVENTION:  If patient unable to extubate within 24 hours and is medically able, would recommend TF initiation.   Assuming K corrected at that time, would recommend  Vital af 1.2 via OGT at goal rate of 60 ml/h (1440 ml per day) and Prostat 30 ml BID to provide 1928 kcals, 138 gm protein, 1168 ml free water daily.  NUTRITION DIAGNOSIS:  Inadequate oral intake related to inability to eat as evidenced by NPO status.  GOAL:  Patient will meet greater than or equal to 90% of their needs  MONITOR:  Diet advancement, Vent status, Labs, Weight trends  REASON FOR ASSESSMENT:  Ventilator    ASSESSMENT:  83 y/o male PMHx Parkinson's, CAD S/P CABG, CHF, HLD, CHF. Had 1 week of weakness and 1-2 days jerking movements of hands prior to a fall at home and shortly after became unconscious. He was intubated in ED for airway protection. Worked up for cardiogenic and septic shock, severe hyperkalemia, AKI, cellulitis.   Pt intubated, sedated on Fentanyl. No historians present.   Abdomen notably distended. OG with minimal output.   NFPE: mild edema. No discernible muscle/fat wasting  MAP was briefly 59, now back to 104  Patient is currently intubated on ventilator support MV: 10.2 L/min Temp (24hrs), Avg:97.3 F (36.3 C), Min:95 F (35 C), Max:99.9 F (37.7 C)  Propofol: None  Labs: K: 5.5 (trending down from 8.2), BUN/Creat:47/3.02, BG: Have improved to 98-181,  Meds: PPI, Pressor support:Levo, IV abx, Fentanyl, ppi,   Recent Labs Lab 10/31/16 2202  11/01/16 0305 11/01/16 0616 11/01/16 0923  NA 138  --  141  --  144  K 7.0*  < > 7.3* 6.9* 5.5*  CL 107  --  109  --  113*  CO2 24  --  23  --  24  BUN 51*  --  50*  --  47*  CREATININE 2.89*  --  3.09*  --  3.02*  CALCIUM 9.1  --  8.9  --  8.3*  GLUCOSE 231*  --  243*  --  98  < > = values in this interval not displayed.  Diet Order:  Diet NPO time  specified  Skin: Cellulitis to R Leg  Last BM:  Unknown  Height:  Ht Readings from Last 1 Encounters:  10/31/16 5\' 9"  (1.753 m)   Weight:  Wt Readings from Last 1 Encounters:  11/01/16 190 lb 4.1 oz (86.3 kg)   Ideal Body Weight:  72.73 kg  BMI:  Body mass index is 28.1 kg/m.  Estimated Nutritional Needs:  Kcal:  1900 (PSU 2003 B) Protein:  125-145g Pro (1.7-2g/kg ibw) Fluid:  Per MD  EDUCATION NEEDS:  No education needs identified at this time  Christophe LouisNathan Cesario Weidinger RD, LDN, CNSC 10.2Clinical Nutrition Pager: [phone] 11/01/2016 11:18 AM

## 2016-11-02 ENCOUNTER — Encounter (HOSPITAL_COMMUNITY): Payer: Self-pay | Admitting: *Deleted

## 2016-11-02 LAB — BLOOD GAS, ARTERIAL
Acid-base deficit: 0.7 mmol/L (ref 0.0–2.0)
Bicarbonate: 23.7 mmol/L (ref 20.0–28.0)
DRAWN BY: 345601
FIO2: 30
MECHVT: 560 mL
O2 SAT: 94.8 %
PCO2 ART: 41.4 mmHg (ref 32.0–48.0)
PEEP: 5 cmH2O
Patient temperature: 98.6
pH, Arterial: 7.376 (ref 7.350–7.450)
pO2, Arterial: 78.7 mmHg — ABNORMAL LOW (ref 83.0–108.0)

## 2016-11-02 LAB — CBC WITH DIFFERENTIAL/PLATELET
BASOS ABS: 0 10*3/uL (ref 0.0–0.1)
BASOS ABS: 0 10*3/uL (ref 0.0–0.1)
BASOS PCT: 0 %
Basophils Absolute: 0 10*3/uL (ref 0.0–0.1)
Basophils Relative: 0 %
Basophils Relative: 0 %
EOS ABS: 0.1 10*3/uL (ref 0.0–0.7)
EOS PCT: 3 %
EOS PCT: 3 %
EOS PCT: 1 %
Eosinophils Absolute: 0.3 10*3/uL (ref 0.0–0.7)
Eosinophils Absolute: 0.3 10*3/uL (ref 0.0–0.7)
HCT: 33.9 % — ABNORMAL LOW (ref 39.0–52.0)
HCT: 33.7 % — ABNORMAL LOW (ref 39.0–52.0)
HEMATOCRIT: 34.8 % — AB (ref 39.0–52.0)
Hemoglobin: 10.8 g/dL — ABNORMAL LOW (ref 13.0–17.0)
Hemoglobin: 10.5 g/dL — ABNORMAL LOW (ref 13.0–17.0)
Hemoglobin: 10.3 g/dL — ABNORMAL LOW (ref 13.0–17.0)
LYMPHS PCT: 11 %
LYMPHS PCT: 10 %
Lymphocytes Relative: 9 %
Lymphs Abs: 1.1 10*3/uL (ref 0.7–4.0)
Lymphs Abs: 1.1 10*3/uL (ref 0.7–4.0)
Lymphs Abs: 0.8 10*3/uL (ref 0.7–4.0)
MCH: 30.3 pg (ref 26.0–34.0)
MCH: 30.7 pg (ref 26.0–34.0)
MCH: 30.2 pg (ref 26.0–34.0)
MCHC: 31 g/dL (ref 30.0–36.0)
MCHC: 31 g/dL (ref 30.0–36.0)
MCHC: 30.6 g/dL (ref 30.0–36.0)
MCV: 97.8 fL (ref 78.0–100.0)
MCV: 99.1 fL (ref 78.0–100.0)
MCV: 98.8 fL (ref 78.0–100.0)
MONO ABS: 1.1 10*3/uL — AB (ref 0.1–1.0)
MONO ABS: 1.1 10*3/uL — AB (ref 0.1–1.0)
MONO ABS: 1 10*3/uL (ref 0.1–1.0)
MONOS PCT: 10 %
MONOS PCT: 10 %
Monocytes Relative: 11 %
NEUTROS ABS: 7.9 10*3/uL — AB (ref 1.7–7.7)
Neutro Abs: 7.8 10*3/uL — ABNORMAL HIGH (ref 1.7–7.7)
Neutro Abs: 7.5 10*3/uL (ref 1.7–7.7)
Neutrophils Relative %: 76 %
Neutrophils Relative %: 76 %
Neutrophils Relative %: 79 %
PLATELETS: 131 10*3/uL — AB (ref 150–400)
PLATELETS: 119 10*3/uL — AB (ref 150–400)
Platelets: 117 10*3/uL — ABNORMAL LOW (ref 150–400)
RBC: 3.56 MIL/uL — ABNORMAL LOW (ref 4.22–5.81)
RBC: 3.42 MIL/uL — AB (ref 4.22–5.81)
RBC: 3.41 MIL/uL — AB (ref 4.22–5.81)
RDW: 16.3 % — AB (ref 11.5–15.5)
RDW: 16.3 % — AB (ref 11.5–15.5)
RDW: 16.3 % — AB (ref 11.5–15.5)
WBC: 10.4 10*3/uL (ref 4.0–10.5)
WBC: 10.3 10*3/uL (ref 4.0–10.5)
WBC: 9.4 10*3/uL (ref 4.0–10.5)

## 2016-11-02 LAB — POTASSIUM
POTASSIUM: 3.8 mmol/L (ref 3.5–5.1)
POTASSIUM: 3 mmol/L — AB (ref 3.5–5.1)
Potassium: 3.4 mmol/L — ABNORMAL LOW (ref 3.5–5.1)

## 2016-11-02 LAB — GLUCOSE, CAPILLARY
GLUCOSE-CAPILLARY: 120 mg/dL — AB (ref 65–99)
GLUCOSE-CAPILLARY: 91 mg/dL (ref 65–99)
GLUCOSE-CAPILLARY: 98 mg/dL (ref 65–99)
Glucose-Capillary: 117 mg/dL — ABNORMAL HIGH (ref 65–99)
Glucose-Capillary: 132 mg/dL — ABNORMAL HIGH (ref 65–99)
Glucose-Capillary: 88 mg/dL (ref 65–99)

## 2016-11-02 LAB — BASIC METABOLIC PANEL
Anion gap: 9 (ref 5–15)
BUN: 37 mg/dL — AB (ref 6–20)
CALCIUM: 7.4 mg/dL — AB (ref 8.9–10.3)
CO2: 24 mmol/L (ref 22–32)
CREATININE: 2.67 mg/dL — AB (ref 0.61–1.24)
Chloride: 115 mmol/L — ABNORMAL HIGH (ref 101–111)
GFR calc Af Amer: 24 mL/min — ABNORMAL LOW (ref >60)
GFR, EST NON AFRICAN AMERICAN: 21 mL/min — AB (ref >60)
GLUCOSE: 137 mg/dL — AB (ref 65–99)
Potassium: 3.7 mmol/L (ref 3.5–5.1)
Sodium: 148 mmol/L — ABNORMAL HIGH (ref 135–145)

## 2016-11-02 LAB — URINE CULTURE: Culture: NO GROWTH

## 2016-11-02 MED ORDER — ORAL CARE MOUTH RINSE
15.0000 mL | Freq: Two times a day (BID) | OROMUCOSAL | Status: DC
  Administered 2016-11-03 (×3): 15 mL via OROMUCOSAL

## 2016-11-02 MED ORDER — PIPERACILLIN-TAZOBACTAM 3.375 G IVPB
3.3750 g | Freq: Three times a day (TID) | INTRAVENOUS | Status: DC
  Administered 2016-11-02 – 2016-11-03 (×3): 3.375 g via INTRAVENOUS
  Filled 2016-11-02 (×4): qty 50

## 2016-11-02 MED ORDER — POTASSIUM CHLORIDE 10 MEQ/50ML IV SOLN
10.0000 meq | INTRAVENOUS | Status: AC
  Administered 2016-11-02 – 2016-11-03 (×4): 10 meq via INTRAVENOUS
  Filled 2016-11-02 (×4): qty 50

## 2016-11-02 MED ORDER — SODIUM CHLORIDE 0.45 % IV SOLN
INTRAVENOUS | Status: DC
  Administered 2016-11-02: 21:00:00 via INTRAVENOUS

## 2016-11-02 MED ORDER — CHLORHEXIDINE GLUCONATE 0.12 % MT SOLN
15.0000 mL | Freq: Two times a day (BID) | OROMUCOSAL | Status: DC
  Administered 2016-11-02 – 2016-11-03 (×3): 15 mL via OROMUCOSAL
  Filled 2016-11-02 (×2): qty 15

## 2016-11-02 NOTE — Evaluation (Signed)
Clinical/Bedside Swallow Evaluation Patient Details  Name: Jack Carter MRN: [id number] Date of Birth: 02-14-1934  Today's Date: 11/02/2016 Time: SLP Start Time (ACUTE ONLY): 1640 SLP Stop Time (ACUTE ONLY): 1652 SLP Time Calculation (min) (ACUTE ONLY): 12 min  Past Medical History: History reviewed. No pertinent past medical history. Past Surgical History: History reviewed. No pertinent surgical history. HPI:  83 yr old male with Hx of parkinson disease, CAD s/p CABG, CHF who presented with 1 week history of weakness and fall at home, was unresponsive and required intubation. Admitted with septic and cardiogenic shock secondary to cellulitis and hyperkalemia. Severe hyperkalemia from AKI, use of K and bactrim. Severe bradycardia from hyperkalemia. Acute hypoxemic respiratory failure. Severe metabolic encephalopathy. Intubated 8/31-9/2. SLP consulted for swallow evaluation; per MD pt has PO meds for PD, requesting if he can take orally.    Assessment / Plan / Recommendation Clinical Impression  Patient presents with severe risk for aspiration in the setting of recent intubation with history of Parkinson's disease (unable to take medications). He presents with hoarseness, low vocal intensity, and weak, congested cough. With single ice chips, teaspoons of thin liquids, pt displays immediate and delayed, gurgling cough. Suspect delayed swallow initiation. Attempted 1/2 teaspoon pureed solid to determine ability for pt to take oral meds in puree, however pt continues to display signs of reduced airway protection.  Patient's Parkinson's medications may have positive impact on his swallowing function, however given his clinical presentation and risk factors for silent aspiration, recommend FEES prior to initiating oral intake. He may need temporary means of alternative nutrition/hydration. Will f/u next date for FEES with additional recommendations to follow instrumental exam. SLP Visit Diagnosis:  Dysphagia, unspecified (R13.10)    Aspiration Risk  Severe aspiration risk;Risk for inadequate nutrition/hydration    Diet Recommendation NPO;Ice chips PRN after oral care;Alternative means - temporary   Medication Administration: Via alternative means    Other  Recommendations Oral Care Recommendations: Oral care QID;Oral care prior to ice chip/H20   Follow up Recommendations Other (comment) (TBD)      Frequency and Duration min 2x/week  2 weeks       Prognosis Prognosis for Safe Diet Advancement: Good Barriers to Reach Goals: Cognitive deficits (comorbidities)      Swallow Study   General Date of Onset: 10/31/16 HPI: 83 yr old male with Hx of parkinson disease, CAD s/p CABG, CHF who presented with 1 week history of weakness and fall at home, was unresponsive and required intubation. Admitted with septic and cardiogenic shock secondary to cellulitis and hyperkalemia. Severe hyperkalemia from AKI, use of K and bactrim. Severe bradycardia from hyperkalemia. Acute hypoxemic respiratory failure. Severe metabolic encephalopathy. Intubated 8/31-9/2. SLP consulted for swallow evaluation; per MD pt has PO meds for PD, requesting if he can take orally.  Type of Study: Bedside Swallow Evaluation Previous Swallow Assessment: none in chart; limited medical history on file Diet Prior to this Study: NPO Temperature Spikes Noted: Yes Respiratory Status: Nasal cannula History of Recent Intubation: Yes Length of Intubations (days): 3 days Date extubated: 11/02/16 Behavior/Cognition: Alert;Cooperative;Confused Oral Cavity Assessment: Within Functional Limits Oral Care Completed by SLP: Yes Oral Cavity - Dentition: Missing dentition Vision: Functional for self-feeding Self-Feeding Abilities: Needs assist Patient Positioning: Upright in bed Baseline Vocal Quality: Low vocal intensity;Hoarse Volitional Cough: Weak;Wet;Congested Volitional Swallow: Able to elicit    Oral/Motor/Sensory  Function Overall Oral Motor/Sensory Function: Mild impairment Facial ROM: Reduced right;Reduced left Facial Symmetry: Within Functional Limits Facial Strength: Reduced right;Reduced left  Facial Sensation: Within Functional Limits Lingual ROM: Within Functional Limits Lingual Symmetry: Within Functional Limits Lingual Strength: Reduced Lingual Sensation: Within Functional Limits Velum: Within Functional Limits Mandible: Within Functional Limits   Ice Chips Ice chips: Impaired Presentation: Spoon Pharyngeal Phase Impairments: Wet Vocal Quality;Cough - Immediate   Thin Liquid Thin Liquid: Impaired Presentation: Spoon Pharyngeal  Phase Impairments: Suspected delayed Swallow;Wet Vocal Quality;Cough - Delayed    Nectar Thick Nectar Thick Liquid: Not tested   Honey Thick Honey Thick Liquid: Not tested   Puree Puree: Impaired Pharyngeal Phase Impairments: Cough - Delayed;Wet Vocal Quality   Solid   GO   Solid: Not tested       Rondel BatonMary Beth Sherald Balbuena, MS, CCC-SLP Speech-Language Pathologist [phone]  Arlana LindauMary E Bernhardt Riemenschneider 11/02/2016,5:13 PM

## 2016-11-02 NOTE — Progress Notes (Signed)
Patient ID: Jack Carter, male   DOB: 07/20/1933, 83 y.o.   MRN: [id number]  [facility] Progress Note   Assessment/ Plan:   1. Acute kidney injury (Unknown renal baseline): Likely ATN associated with shock/renal hypoperfusion as well as the effect of Bactrim on creatinine handling. Oliguric and now with improving renal function. 2. Hyperkalemia:  likely a combination of iatrogenic (potassium supplement, ARB, Bactrim) as well as the impact of acute kidney injury on potassium handling. Corrected with Kayexalate/additional medical measures . 3. Shock/hypothermia:  weaning pressors as permitted.  4. Parkinsonism: Significant tremor noted, will likely need restarting of his outpatient medications.  We will sign off at this time-please call with questions or concerns and resume ARB therapy/diuretics and renal function back to baseline   Subjective:   Multiple bowel movements noted because of Kayexalate therapy.    Objective:   BP (!) 117/45   Pulse 63   Temp 99 F (37.2 C)   Resp 14   Ht 5\' 9"  (1.753 m)   Wt 90.3 kg (199 lb 1.2 oz)   SpO2 100%   BMI 29.40 kg/m   Intake/Output Summary (Last 24 hours) at 11/02/16 0825 Last data filed at 11/02/16 0600  Gross per 24 hour  Intake          4582.45 ml  Output             1852 ml  Net          2730.45 ml   Weight change: 4.117 kg (9 lb 1.2 oz)  Physical Exam: WUJ:[id number]:Intubated, sedated and comfortable . CVS: Pulse regular rhythm, normal rate, S1 and S2 normal Resp: Coarse breath sounds bilaterally, no distinct rales/rhonchi Abd: Soft, obese, nontender Ext: Trace-1+ edema with chronic venous stasis changes and some superimposed erythema .  Imaging: Ct Head Wo Contrast  Result Date: 11/01/2016 CLINICAL DATA:  83 y/o M; unwitnessed fall at home. No noticeable head trauma. EXAM: CT HEAD WITHOUT CONTRAST TECHNIQUE: Contiguous axial images were obtained from the base of the skull through the vertex without intravenous contrast.  COMPARISON:  None. FINDINGS: Brain: No evidence of acute infarction, hemorrhage, hydrocephalus, extra-axial collection or mass lesion/mass effect. Few hospice foci of hypoattenuation in subcortical and periventricular white matter are compatible with mild chronic microvascular ischemic changes. Moderate diffuse parenchymal volume loss. Vascular: Calcific atherosclerosis of carotid siphons. No hyperdense vessel identified. Skull: Normal. Negative for fracture or focal lesion. Sinuses/Orbits: Paranasal sinus mucosal thickening and fluid within the nasopharynx is likely due to intubation. Chronic inspissated opacification of the right maxillary sinus. Normal aeration of the mastoid air cells. Debris in external auditory canals, likely cerumen. Other: Choose IMPRESSION: 1. No acute intracranial abnormality identified. No calvarial fracture. 2. Mild chronic microvascular ischemic changes and moderate parenchymal volume loss of the brain. 3. Chronic inspissated opacification of right maxillary sinus. Electronically Signed   By: Mitzi HansenLance  Furusawa-Stratton M.D.   On: 11/01/2016 00:30   [place] Retroperitoneal (renal,aorta,ivc Nodes)  Result Date: 10/31/2016 CLINICAL DATA:  Acute renal injury EXAM: RENAL / URINARY TRACT ULTRASOUND COMPLETE COMPARISON:  None. FINDINGS: Right Kidney: Length: 10.1 cm. Echogenicity within normal limits. No mass or hydronephrosis visualized. Left Kidney: Length: 10.6 cm. Echogenicity within normal limits. No mass or hydronephrosis visualized. Bladder: Decompressed by Foley catheter. IMPRESSION: Maintained cortical-medullary distinction within both kidneys without obstructive uropathy. Decompressed bladder with indwelling Foley catheter. Electronically Signed   By: Tollie Ethavid  Kwon M.D.   On: 10/31/2016 22:25   Dg Chest Portable 1 View  Result Date: 10/31/2016 CLINICAL DATA:  Post intubation. EXAM: PORTABLE CHEST 1 VIEW COMPARISON:  None. FINDINGS: An endotracheal tube is identified with tip 2.8 cm  above the carina. An NG tube is identified entering the stomach with tip off the field of view. Defibrillator pads overlying the chest are noted. Cardiomegaly identified. No airspace disease, definite pleural effusion or pneumothorax noted. IMPRESSION: Support apparatus as described. Cardiomegaly without evidence of acute cardiopulmonary disease. Electronically Signed   By: Harmon Pier M.D.   On: 10/31/2016 19:59    Labs: [employer]  Recent Labs Lab 10/31/16 1921 10/31/16 1928 10/31/16 2052 10/31/16 2202  11/01/16 0305 11/01/16 0616 11/01/16 0923 11/01/16 1513 11/01/16 1741 11/02/16 0120 11/02/16 0316  NA 134* 138 144 138  --  141  --  144 147*  --   --  148*  K >7.5* 8.2* 6.8* 7.0*  < > 7.3* 6.9* 5.5* 5.2* 5.5* 3.8 3.7  CL 107 110 112* 107  --  109  --  113* 113*  --   --  115*  CO2 20*  --   --  24  --  23  --  24 24  --   --  24  GLUCOSE 160* 151* 142* 231*  --  243*  --  98 126*  --   --  137*  BUN 52* 47* 46* 51*  --  50*  --  47* 44*  --   --  37*  CREATININE 2.99* 2.90* 2.90* 2.89*  --  3.09*  --  3.02* 3.15*  --   --  2.67*  CALCIUM 8.5*  --   --  9.1  --  8.9  --  8.3* 8.2*  --   --  7.4*  < > = values in this interval not displayed. CBC  Recent Labs Lab 11/01/16 0923 11/01/16 1513 11/01/16 1938 11/02/16 0316  WBC 8.3 12.0* 10.8* 10.4  NEUTROABS 5.9 9.0* 8.4* 7.9*  HGB 11.6* 11.5* 11.0* 10.8*  HCT 35.7* 36.1* 35.5* 34.8*  MCV 95.2 96.8 97.5 97.8  PLT 136* 155 153 131*    Medications:    . amantadine  100 mg Per Tube TID  . carbidopa-levodopa  1 tablet Per Tube TID  . free water  200 mL Per Tube Q4H  . [START ON 11/04/2016] pantoprazole  40 mg Intravenous Q12H  . primidone  50 mg Oral Q12H   Zetta Bills, MD 11/02/2016, 8:25 AM

## 2016-11-02 NOTE — Procedures (Signed)
Extubation Procedure Note  Patient Details:   Name: Jack Carter DOB: 06/13/1933 MRN: [id number]   Airway Documentation:     Evaluation  O2 sats: stable throughout Complications: No apparent complications Patient did tolerate procedure well. Bilateral Breath Sounds: Diminished   Yes   Patient extubated to 4lnc. Vital signs stable at this time. No complications. RN at bedside. RT will continue to monitor.  Ave Filterdkins, Kathleena Freeman Politano 11/02/2016, 10:56 AM

## 2016-11-02 NOTE — Progress Notes (Signed)
Pharmacy Antibiotic Note  Jack Carter is a 83 y.o. male admitted on 10/31/2016 with cellulitis. Currently on IV zosyn. Vancomycin d/c'ed this AM. CrCl improved to ~ 25 mL/min. Patient remains culture negative so far. WBC wnl.   Plan: Increase Zosyn to 3.375 gm IV Q 8 hours Monitor cultures, CBC and renal fx   Height: 5\' 9"  (175.3 cm) Weight: 199 lb 1.2 oz (90.3 kg) IBW/kg (Calculated) : 70.7  Temp (24hrs), Avg:99.5 F (37.5 C), Min:99 F (37.2 C), Max:100.2 F (37.9 C)   Recent Labs Lab 10/31/16 1936  10/31/16 2202 10/31/16 2230 11/01/16 0305 11/01/16 0923 11/01/16 1513 11/01/16 1938 11/02/16 0316 11/02/16 1100  WBC  --   < > 13.6*  --  9.8 8.3 12.0* 10.8* 10.4 10.3  CREATININE  --   < > 2.89*  --  3.09* 3.02* 3.15*  --  2.67*  --   LATICACIDVEN 2.05*  --   --  4.11*  --   --   --   --   --   --   < > = values in this interval not displayed.  Estimated Creatinine Clearance: 24.1 mL/min (A) (by C-G formula based on SCr of 2.67 mg/dL (H)).    No Known Allergies  Jack Carter, PharmD., BCPS Clinical Pharmacist Pager [phone]

## 2016-11-02 NOTE — Progress Notes (Signed)
eLink Physician-Brief Progress Note Patient Name: Jack Carter DOB: 03/02/1934 MRN: [id number]   Date of Service  11/02/2016  HPI/Events of Note  Hypokalemia with 3.0 noted. Renal function steadily improving.   eICU Interventions  1. Continuing telemetry monitoring 2. KCl 10 mEq IV 4 runs 3. Close Monitoring electrolytes      Intervention Category Major Interventions: Electrolyte abnormality - evaluation and management  Lawanda CousinsJennings Nestor 11/02/2016, 8:28 PM

## 2016-11-02 NOTE — Progress Notes (Signed)
PULMONARY / CRITICAL CARE MEDICINE   Name: Jack Carter MRN: [id number] DOB: [birth year]    ADMISSION DATE:  10/31/2016 CONSULTATION DATE:  10/31/2016  REFERRING MD:  ED  CHIEF COMPLAINT:  Altered mental status  HISTORY OF PRESENT ILLNESS:   83 yr old male with Hx of parkinson disease, CAD s/p CABG, CHF who is coming after as per his daughter him being very weak in the last week. Today he was in the bathroom and she all of a sudden heard a thump and him screaming. He told her that he fell and maybe bumped his head but not hard. He did not lose consciousness. He tried to stand up again and he couldn't so she let him down and then shortly he started becoming unconscious.   As per daughter he has cellulitis of his legs and he has been feeling weak in the last week and though he might not be taking his medicines. He just moved from [place] so we dont have much info and she does not know much of his past either. On arrival to the ED was found to be severely bradycardic and hypotensive. IVC is full as per ED echo. Patient has K of more than 8 and is taking K and bactrim at home.    SUBJECTIVE:  No distress Now awake & ready for extubation   VITAL SIGNS: BP (!) 117/45   Pulse 63   Temp 99 F (37.2 C)   Resp 14   Ht 5\' 9"  (1.753 m)   Wt 199 lb 1.2 oz (90.3 kg)   SpO2 100%   BMI 29.40 kg/m   HEMODYNAMICS:    VENTILATOR SETTINGS: Vent Mode: CPAP;PSV FiO2 (%):  [30 %] 30 % Set Rate:  [14 bmp] 14 bmp Vt Set:  [560 mL] 560 mL PEEP:  [5 cmH20] 5 cmH20 Pressure Support:  [10 cmH20] 10 cmH20 Plateau Pressure:  [14 cmH20-20 cmH20] 20 cmH20  INTAKE / OUTPUT: I/O last 3 completed shifts: In: 7897 [I.V.:7697; IV Piggyback:200] Out: 2402 [Urine:2400; Stool:2]  PHYSICAL EXAMINATION: General appearance:  83 Year old  Male, well nourished NAD, conversant  Eyes: anicteric sclerae , moist conjunctivae; PERRL, EOMI bilaterally. Mouth:  membranes and no mucosal ulcerations; normal hard and  soft palate, orally intubated Neck: Trachea midline; neck supple, no JVD Lungs/chest: CTA, with normal respiratory effort and no intercostal retractions CV: RRR, no MRGs  Abdomen: Soft, non-tender; no masses or HSM Extremities: No sig peripheral edema or extremity lymphadenopathy Skin: Normal temperature, turgor and texture; no rash, ulcers or subcutaneous nodules Psych: Appropriate affect, alert and oriented to person, place and time + tremor but better when c/w yesterday  r  LABS:  BMET  Recent Labs Lab 11/01/16 0923 11/01/16 1513 11/01/16 1741 11/02/16 0120 11/02/16 0316  NA 144 147*  --   --  148*  K 5.5* 5.2* 5.5* 3.8 3.7  CL 113* 113*  --   --  115*  CO2 24 24  --   --  24  BUN 47* 44*  --   --  37*  CREATININE 3.02* 3.15*  --   --  2.67*  GLUCOSE 98 126*  --   --  137*    Electrolytes  Recent Labs Lab 11/01/16 0923 11/01/16 1513 11/02/16 0316  CALCIUM 8.3* 8.2* 7.4*    CBC  Recent Labs Lab 11/01/16 1513 11/01/16 1938 11/02/16 0316  WBC 12.0* 10.8* 10.4  HGB 11.5* 11.0* 10.8*  HCT 36.1* 35.5* 34.8*  PLT 155  153 131*    Coag's  Recent Labs Lab 10/31/16 1921  APTT 21*  INR 1.10    Sepsis Markers  Recent Labs Lab 10/31/16 1936 10/31/16 2230  LATICACIDVEN 2.05* 4.11*    ABG  Recent Labs Lab 11/01/16 0225 11/02/16 0318  PHART 7.461* 7.376  PCO2ART 34.3 41.4  PO2ART 402.0* 78.7*    ABG    Component Value Date/Time   PHART 7.376 11/02/2016 0318   PCO2ART 41.4 11/02/2016 0318   PO2ART 78.7 (L) 11/02/2016 0318   HCO3 23.7 11/02/2016 0318   TCO2 26 11/01/2016 0225   ACIDBASEDEF 0.7 11/02/2016 0318   O2SAT 94.8 11/02/2016 0318   Liver Enzymes  Recent Labs Lab 10/31/16 1921  AST 57*  ALT 27  ALKPHOS 195*  BILITOT 1.3*  ALBUMIN 3.2*    Cardiac Enzymes  Recent Labs Lab 10/31/16 1921  TROPONINI 0.13*    Glucose  Recent Labs Lab 11/01/16 1125 11/01/16 1515 11/01/16 2042 11/02/16 0038 11/02/16 0430  11/02/16 0732  GLUCAP 85 86 91 132* 120* 117*    Imaging No results found.   STUDIES:  CT head >>1. No acute intracranial abnormality identified. No calvarial fracture. 2. Mild chronic microvascular ischemic changes and moderate parenchymal volume loss of the brain. 3. Chronic inspissated opacification of right maxillary sinus.  CULTURES: Blood 8/31>>> Urine 8/31>>>  ANTIBIOTICS: Zosyn vanc 8/31>>>  SIGNIFICANT EVENTS: Intubated 8/31  LINES/TUBES: femoral CVC   DISCUSSION: Patient is coming in with septic and cardiogenic shock secondary to cellulitis and hyperkalemia. Severe hyperkalemia from AKI, use of K and bactrim. Severe bradycardia from hyperkalemia. Acute hypoxemic respiratory failure. Severe metabolic encephalopathy.  -shock resolved -K better/renal fxn better -passed SBT For today -extubate  ASSESSMENT / PLAN:  PULMONARY A: acue hypoxemic resp failure requiring mechanical ventilation  PCXR personally reviewed: support ETT good position. No acute processes noted.  ->abg reviewed ->passed SBT P:   Extubate Wean oxygen  Stop sedating meds Mobilize  Asp precautions   CARDIOVASCULAR A:  Cardiogenic shock secondary to severe bradycardia from hyperkalemia and septic shock from cellulitis  Known CAD s/p CABG  Trop elevation  ->shock resolved P:  F/u echo See renal section below Cont tele   RENAL A:   Severe hyperkalemia from supplements, AKI and bactrim Hypernatremia and Chloremia in setting of aggressive NaCl replacement  ->creatinine better, but Na and Cl increased.  P:   Change IVF to 1/2 NS F/u am chem Holding ARB and diuretic until renal fxn returns  GASTROINTESTINAL A:   Upper GI bleeding ?stress vs traumatic -->favoring traumatic as no bloody output at this point on NG sxn AND hgb stable  P:   Cont PPI bid NPO for now; adv diet as able   HEMATOLOGIC A:   Mild anemia -->no current evidence of bleeding P:  Trend CBC Resume  ASA ->if Hgb still stable as of 9/3 can start [place] heparin.   INFECTIOUS A:   Cellulitis  ->BCs neg to date P:   Stop vanc Cont zosyn may narrow further if no + cultures in next 24 hrs  ENDOCRINE A:   No issues  P:   Follow glucose   NEUROLOGIC A:   Metabolic encephalopathy  H/o PD P:   Dc precedex and sedating meds Dc PAD protocol Cont Home parkinsons medications  OOB PT consult    FAMILY  - Updates: spoke to family and they known his overall guarded condition. patient is now DNR but will continue aggressive care.   -  Inter-disciplinary family meet or Palliative Care meeting due by:  9/7 My cct 32 min  Simonne Martinet ACNP-BC [facility] Pulmonary/Critical Care Pager # [phone] OR # [phone] if no answer  11/02/2016, 10:27 AM

## 2016-11-03 ENCOUNTER — Inpatient Hospital Stay (HOSPITAL_COMMUNITY): Payer: Medicare Other

## 2016-11-03 DIAGNOSIS — E876 Hypokalemia: Secondary | ICD-10-CM

## 2016-11-03 DIAGNOSIS — E870 Hyperosmolality and hypernatremia: Secondary | ICD-10-CM

## 2016-11-03 DIAGNOSIS — L03116 Cellulitis of left lower limb: Secondary | ICD-10-CM

## 2016-11-03 DIAGNOSIS — I340 Nonrheumatic mitral (valve) insufficiency: Secondary | ICD-10-CM

## 2016-11-03 DIAGNOSIS — G20 Parkinson's disease: Secondary | ICD-10-CM

## 2016-11-03 DIAGNOSIS — G9341 Metabolic encephalopathy: Secondary | ICD-10-CM

## 2016-11-03 DIAGNOSIS — T17908A Unspecified foreign body in respiratory tract, part unspecified causing other injury, initial encounter: Secondary | ICD-10-CM

## 2016-11-03 DIAGNOSIS — F0280 Dementia in other diseases classified elsewhere without behavioral disturbance: Secondary | ICD-10-CM

## 2016-11-03 LAB — CBC WITH DIFFERENTIAL/PLATELET
BASOS PCT: 0 %
Basophils Absolute: 0 10*3/uL (ref 0.0–0.1)
Basophils Absolute: 0 10*3/uL (ref 0.0–0.1)
Basophils Relative: 0 %
EOS ABS: 0.2 10*3/uL (ref 0.0–0.7)
EOS PCT: 1 %
Eosinophils Absolute: 0.1 10*3/uL (ref 0.0–0.7)
Eosinophils Relative: 2 %
HCT: 32.8 % — ABNORMAL LOW (ref 39.0–52.0)
HCT: 34.3 % — ABNORMAL LOW (ref 39.0–52.0)
Hemoglobin: 10.4 g/dL — ABNORMAL LOW (ref 13.0–17.0)
Hemoglobin: 10.7 g/dL — ABNORMAL LOW (ref 13.0–17.0)
LYMPHS ABS: 0.8 10*3/uL (ref 0.7–4.0)
LYMPHS ABS: 0.7 10*3/uL (ref 0.7–4.0)
Lymphocytes Relative: 10 %
Lymphocytes Relative: 9 %
MCH: 31 pg (ref 26.0–34.0)
MCH: 30.7 pg (ref 26.0–34.0)
MCHC: 31.7 g/dL (ref 30.0–36.0)
MCHC: 31.2 g/dL (ref 30.0–36.0)
MCV: 97.9 fL (ref 78.0–100.0)
MCV: 98.3 fL (ref 78.0–100.0)
MONO ABS: 0.9 10*3/uL (ref 0.1–1.0)
MONO ABS: 1 10*3/uL (ref 0.1–1.0)
MONOS PCT: 13 %
Monocytes Relative: 11 %
NEUTROS PCT: 77 %
Neutro Abs: 6.2 10*3/uL (ref 1.7–7.7)
Neutro Abs: 6.3 10*3/uL (ref 1.7–7.7)
Neutrophils Relative %: 77 %
PLATELETS: 118 10*3/uL — AB (ref 150–400)
PLATELETS: 138 10*3/uL — AB (ref 150–400)
RBC: 3.35 MIL/uL — AB (ref 4.22–5.81)
RBC: 3.49 MIL/uL — ABNORMAL LOW (ref 4.22–5.81)
RDW: 16.1 % — ABNORMAL HIGH (ref 11.5–15.5)
RDW: 16.8 % — AB (ref 11.5–15.5)
WBC: 8.1 10*3/uL (ref 4.0–10.5)
WBC: 8.1 10*3/uL (ref 4.0–10.5)

## 2016-11-03 LAB — GLUCOSE, CAPILLARY
GLUCOSE-CAPILLARY: 87 mg/dL (ref 65–99)
GLUCOSE-CAPILLARY: 119 mg/dL — AB (ref 65–99)
Glucose-Capillary: 105 mg/dL — ABNORMAL HIGH (ref 65–99)

## 2016-11-03 LAB — BASIC METABOLIC PANEL
Anion gap: 5 (ref 5–15)
BUN: 28 mg/dL — AB (ref 6–20)
CO2: 28 mmol/L (ref 22–32)
CREATININE: 2.2 mg/dL — AB (ref 0.61–1.24)
Calcium: 7.4 mg/dL — ABNORMAL LOW (ref 8.9–10.3)
Chloride: 116 mmol/L — ABNORMAL HIGH (ref 101–111)
GFR, EST AFRICAN AMERICAN: 31 mL/min — AB (ref >60)
GFR, EST NON AFRICAN AMERICAN: 26 mL/min — AB (ref >60)
Glucose, Bld: 99 mg/dL (ref 65–99)
POTASSIUM: 2.8 mmol/L — AB (ref 3.5–5.1)
SODIUM: 149 mmol/L — AB (ref 135–145)

## 2016-11-03 LAB — ECHOCARDIOGRAM COMPLETE
HEIGHTINCHES: 69 in
WEIGHTICAEL: 3037.06 [oz_av]

## 2016-11-03 MED ORDER — AMANTADINE HCL 50 MG/5ML PO SYRP
100.0000 mg | ORAL_SOLUTION | Freq: Three times a day (TID) | ORAL | Status: DC
  Administered 2016-11-03 – 2016-11-04 (×2): 100 mg via ORAL
  Filled 2016-11-03 (×4): qty 10

## 2016-11-03 MED ORDER — POTASSIUM CHLORIDE 10 MEQ/50ML IV SOLN
10.0000 meq | INTRAVENOUS | Status: AC
  Administered 2016-11-03 (×6): 10 meq via INTRAVENOUS
  Filled 2016-11-03 (×6): qty 50

## 2016-11-03 MED ORDER — HEPARIN SODIUM (PORCINE) 5000 UNIT/ML IJ SOLN
5000.0000 [IU] | Freq: Three times a day (TID) | INTRAMUSCULAR | Status: DC
  Administered 2016-11-03 – 2016-11-07 (×13): 5000 [IU] via SUBCUTANEOUS
  Filled 2016-11-03 (×15): qty 1

## 2016-11-03 MED ORDER — SODIUM CHLORIDE 0.9 % IV SOLN
3.0000 g | Freq: Two times a day (BID) | INTRAVENOUS | Status: DC
  Administered 2016-11-03 – 2016-11-04 (×2): 3 g via INTRAVENOUS
  Filled 2016-11-03 (×3): qty 3

## 2016-11-03 MED ORDER — CARBIDOPA-LEVODOPA 25-100 MG PO TABS
1.0000 | ORAL_TABLET | Freq: Three times a day (TID) | ORAL | Status: DC
  Administered 2016-11-04: 1 via ORAL
  Filled 2016-11-03 (×3): qty 1

## 2016-11-03 MED ORDER — DEXTROSE 5 % IV SOLN
INTRAVENOUS | Status: DC
  Administered 2016-11-03 – 2016-11-04 (×2): via INTRAVENOUS

## 2016-11-03 NOTE — Progress Notes (Signed)
PROGRESS NOTE    Jack Carter  ZOX:[id number] DOB: 08/08/1933 DOA: 10/31/2016 PCP: No primary care provider on file.   Brief Narrative:  83 yr old WM PMHx Parkinson disease, CAD s/p CABG, CHF   Who is coming after as per his daughter him being very weak in the last week. Today he was in the bathroom and she all of a sudden heard a thump and him screaming. He told her that he fell and maybe bumped his head but not hard. He did not lose consciousness. He tried to stand up again and he couldn't so she let him down and then shortly he started becoming unconscious.    As per daughter he has cellulitis of his legs and he has been feeling weak in the last week and though he might not be taking his medicines. He just moved from [place] so we dont have much info and she does not know much of his past either. On arrival to the ED was found to be severely bradycardic and hypotensive. IVC is full as per ED echo. Patient has K of more than 8 and is taking K and bactrim at home.     Subjective: 9/3 alert, follows commands, bedside swallow minimal gag reflex. Resting tremors.negative CP, negative SOB, negative N/V, negative abdominal pain     Assessment & Plan:   Active Problems:   Septic shock (HCC)   Cellulitis   Hyperkalemia   AKI (acute kidney injury) (HCC)   Cardiogenic shock (HCC)   Acute hypoxemic respiratory failure (HCC)   CAD (coronary artery disease) of artery bypass graft   CHF (congestive heart failure) (HCC)   Parkinson disease (HCC)   Upper GI bleed   Cardiogenic shock -Multifactorial bradycardia, hyperkalemia, septic shock secondary to cellulitis -Shock resolving. -Echocardiogram pending    Known CAD s/p CABG  -Elevated troponin  Acute renal failure (unknown baseline due to area)    Recent Labs Lab 10/31/16 2202 11/01/16 0305 11/01/16 0923 11/01/16 1513 11/02/16 0316 11/03/16 0400  CREATININE 2.89* 3.09* 3.02* 3.15* 2.67* 2.20*  -Improving continue monitor  closely   Upper GI bleed?Mild anemia/Thrombocytopenia    Recent Labs Lab 11/01/16 1513 11/01/16 1938 11/02/16 0316 11/02/16 1100 11/02/16 1745 11/03/16 0400  HGB 11.5* 11.0* 10.8* 10.5* 10.3* 10.4*  -Relatively stable will continue to monitor  -no current evidence of active bleeding -Start subcutaneous heparin, monitor closely Trend CBC -Resume ASA  Aspiration? -On suctioning obtaining thick yellow chunky sputum -Current medication for cellulitis would cover aspiration pneumonia. -Continue nothing by mouth -PCXR on 9/4   Cellulitis  -Blood culture NGTD  -Would complete minimal 7 days antibiotics    Metabolic encephalopathy/Parkinson's disease  -Unsure of baseline however per RN Theresa's discussion with daughter prior to admission patient was able to consume his meals normally, and converse. -Amantadine 100 mg TID -Sinemet 25-100 mg TID -Primidone 50 mg BID  Dysphagia -Most likely combination of acute insult and Parkinson's. Unsure of baseline. -9/3 request Placement CorTrak tube  Hypernatremia/Hyperchloremia -Iatrogenic secondary to aggressive fluid replacement. -D5W 75 ml/hr  Hyperkalemia/Hypokalemia -Severe hyperkalemia (most likely from supplements and Bactrim) resolved, patient now hypokalemia -Potassium goal>4  -Potassium IV 60 mEq  -Check K/Mg at 1400     DVT prophylaxis: Subcutaneous heparin Code Status: DO NOT RESUSCITATE Family Communication: None Disposition Plan: TBD   Consultants:  [facility] Neph[facility]    Procedures/Significant Events:  8/31 CT Head: Negative acute infarct/hemorrhage/mass. -Moderate diffuse parenchymal volume loss -Chronic inspissated opacification RIGHT maxillary sinus  I have personally reviewed and interpreted all radiology studies and my findings are as above.  VENTILATOR SETTINGS: None   Cultures 8/31 blood NGTD  8/31 urine negative 9/1 MRSA by PCR negative    Antimicrobials: Anti-infectives    Start      Stop   11/03/16 1300  Ampicillin-Sulbactam (UNASYN) 3 g in sodium chloride 0.9 % 100 mL IVPB         11/02/16 2200  vancomycin (VANCOCIN) IVPB 1000 mg/200 mL premix  Status:  Discontinued     11/02/16 1036   11/02/16 1400  piperacillin-tazobactam (ZOSYN) IVPB 3.375 g  Status:  Discontinued     11/03/16 1033   11/01/16 1400  piperacillin-tazobactam (ZOSYN) IVPB 2.25 g  Status:  Discontinued     11/02/16 1300   11/01/16 0600  piperacillin-tazobactam (ZOSYN) IVPB 3.375 g  Status:  Discontinued     11/01/16 1032   10/31/16 2200  piperacillin-tazobactam (ZOSYN) IVPB 3.375 g     10/31/16 2255   10/31/16 2200  vancomycin (VANCOCIN) 1,500 mg in sodium chloride 0.9 % 500 mL IVPB     11/01/16 0016       Devices None   LINES / TUBES:  Intubated 8/31     Continuous Infusions: . sodium chloride 75 mL/hr at 11/02/16 2115  . sodium chloride    . piperacillin-tazobactam (ZOSYN)  IV 3.375 g (11/03/16 0521)  . potassium chloride 10 mEq (11/03/16 0630)     Objective: Vitals:   11/03/16 0408 11/03/16 0500 11/03/16 0600 11/03/16 0700  BP:  140/74 137/71 134/74  Pulse:      Resp:  18 13 16   Temp:  99.7 F (37.6 C) 99.7 F (37.6 C) 99.5 F (37.5 C)  TempSrc:      SpO2:  99% 99% 99%  Weight: 189 lb 13.1 oz (86.1 kg)     Height:        Intake/Output Summary (Last 24 hours) at 11/03/16 0744 Last data filed at 11/03/16 1610  Gross per 24 hour  Intake          2089.35 ml  Output             2500 ml  Net          -410.65 ml   Filed Weights   11/01/16 0411 11/02/16 0500 11/03/16 0408  Weight: 190 lb 4.1 oz (86.3 kg) 199 lb 1.2 oz (90.3 kg) 189 lb 13.1 oz (86.1 kg)    Examination:  General: Alert, tracks you with his eyes, follows commands, No acute respiratory distress Neck:  Negative scars, masses, torticollis, lymphadenopathy, JVD Lungs: bibasilar coarse breath sounds positive mild expiratory wheezes, negative crackles Cardiovascular: Tachycardic, Regular rhythm without  murmur gallop or rub normal S1 and S2 Abdomen: negative abdominal pain, nondistended, positive soft, bowel sounds, no rebound, no ascites, no appreciable mass Extremities: No significant cyanosis, clubbing, or edema bilateral lower extremities Skin: Negative rashes, lesions, ulcers Psychiatric:  Negative depression, negative anxiety, negative fatigue, negative mania  Central nervous system:  Cranial nerves II through XII intact, tongue/uvula midline, all extremities muscle strength 3-4 /5 (extremely weak), sensation intact throughout,  positive dysarthria, negative expressive aphasia, negative receptive aphasia.  .     Data Reviewed: Care during the described time interval was provided by me .  I have reviewed this patient's available data, including medical history, events of note, physical examination, and all test results as part of my evaluation.   CBC:  Recent Labs Lab 11/01/16 1938 11/02/16  1610 11/02/16 1100 11/02/16 1745 11/03/16 0400  WBC 10.8* 10.4 10.3 9.4 8.1  NEUTROABS 8.4* 7.9* 7.8* 7.5 6.2  HGB 11.0* 10.8* 10.5* 10.3* 10.4*  HCT 35.5* 34.8* 33.9* 33.7* 32.8*  MCV 97.5 97.8 99.1 98.8 97.9  PLT 153 131* 119* 117* 118*   Basic Metabolic Panel:  Recent Labs Lab 11/01/16 0305  11/01/16 0923 11/01/16 1513  11/02/16 0120 11/02/16 0316 11/02/16 1100 11/02/16 1745 11/03/16 0400  NA 141  --  144 147*  --   --  148*  --   --  149*  K 7.3*  < > 5.5* 5.2*  < > 3.8 3.7 3.4* 3.0* 2.8*  CL 109  --  113* 113*  --   --  115*  --   --  116*  CO2 23  --  24 24  --   --  24  --   --  28  GLUCOSE 243*  --  98 126*  --   --  137*  --   --  99  BUN 50*  --  47* 44*  --   --  37*  --   --  28*  CREATININE 3.09*  --  3.02* 3.15*  --   --  2.67*  --   --  2.20*  CALCIUM 8.9  --  8.3* 8.2*  --   --  7.4*  --   --  7.4*  < > = values in this interval not displayed. GFR: Estimated Creatinine Clearance: 28.6 mL/min (A) (by C-G formula based on SCr of 2.2 mg/dL (H)). Liver Function  Tests:  Recent Labs Lab 10/31/16 1921  AST 57*  ALT 27  ALKPHOS 195*  BILITOT 1.3*  PROT 7.1  ALBUMIN 3.2*   No results for input(s): LIPASE, AMYLASE in the last 168 hours. No results for input(s): AMMONIA in the last 168 hours. Coagulation Profile:  Recent Labs Lab 10/31/16 1921  INR 1.10   Cardiac Enzymes:  Recent Labs Lab 10/31/16 1921  TROPONINI 0.13*   BNP (last 3 results) No results for input(s): PROBNP in the last 8760 hours. HbA1C: No results for input(s): HGBA1C in the last 72 hours. CBG:  Recent Labs Lab 11/02/16 1119 11/02/16 1551 11/02/16 2027 11/03/16 0017 11/03/16 0423  GLUCAP 88 91 98 105* 87   Lipid Profile: No results for input(s): CHOL, HDL, LDLCALC, TRIG, CHOLHDL, LDLDIRECT in the last 72 hours. Thyroid Function Tests: No results for input(s): TSH, T4TOTAL, FREET4, T3FREE, THYROIDAB in the last 72 hours. Anemia Panel: No results for input(s): VITAMINB12, FOLATE, FERRITIN, TIBC, IRON, RETICCTPCT in the last 72 hours. Urine analysis:    Component Value Date/Time   COLORURINE AMBER (A) 11/01/2016 0030   APPEARANCEUR HAZY (A) 11/01/2016 0030   LABSPEC 1.019 11/01/2016 0030   PHURINE 5.0 11/01/2016 0030   GLUCOSEU NEGATIVE 11/01/2016 0030   HGBUR SMALL (A) 11/01/2016 0030   BILIRUBINUR NEGATIVE 11/01/2016 0030   KETONESUR NEGATIVE 11/01/2016 0030   PROTEINUR 100 (A) 11/01/2016 0030   NITRITE NEGATIVE 11/01/2016 0030   LEUKOCYTESUR NEGATIVE 11/01/2016 0030   Sepsis Labs: @LABRCNTIP (procalcitonin:4,lacticidven:4)  ) Recent Results (from the past 240 hour(s))  Culture, blood (routine x 2)     Status: None (Preliminary result)   Collection Time: 10/31/16 10:10 PM  Result Value Ref Range Status   Specimen Description BLOOD LEFT HAND  Final   Special Requests IN PEDIATRIC BOTTLE Blood Culture adequate volume  Final   Culture NO GROWTH 2 DAYS  Final  Report Status PENDING  Incomplete  Culture, blood (routine x 2)     Status: None  (Preliminary result)   Collection Time: 10/31/16 10:20 PM  Result Value Ref Range Status   Specimen Description BLOOD RIGHT HAND  Final   Special Requests IN PEDIATRIC BOTTLE Blood Culture adequate volume  Final   Culture NO GROWTH 2 DAYS  Final   Report Status PENDING  Incomplete  Urine culture     Status: None   Collection Time: 11/01/16 12:30 AM  Result Value Ref Range Status   Specimen Description URINE, RANDOM  Final   Special Requests NONE  Final   Culture NO GROWTH  Final   Report Status 11/02/2016 FINAL  Final  MRSA PCR Screening     Status: None   Collection Time: 11/01/16  1:47 AM  Result Value Ref Range Status   MRSA by PCR NEGATIVE NEGATIVE Final    Comment:        The GeneXpert MRSA Assay (FDA approved for NASAL specimens only), is one component of a comprehensive MRSA colonization surveillance program. It is not intended to diagnose MRSA infection nor to guide or monitor treatment for MRSA infections.          Radiology Studies: No results found.      Scheduled Meds: . amantadine  100 mg Per Tube TID  . carbidopa-levodopa  1 tablet Per Tube TID  . chlorhexidine  15 mL Mouth Rinse BID  . mouth rinse  15 mL Mouth Rinse q12n4p  . [START ON 11/04/2016] pantoprazole  40 mg Intravenous Q12H  . primidone  50 mg Oral Q12H   Continuous Infusions: . sodium chloride 75 mL/hr at 11/02/16 2115  . sodium chloride    . piperacillin-tazobactam (ZOSYN)  IV 3.375 g (11/03/16 0521)  . potassium chloride 10 mEq (11/03/16 0630)     LOS: 3 days    Time spent: 40 minutes    WOODS, Roselind MessierURTIS J, MD [facility] Pager [phone]   If 7PM-7AM, please contact night-coverage [email or web address] Password [email or web address] 11/03/2016, 7:44 AM

## 2016-11-03 NOTE — Procedures (Signed)
Objective Swallowing Evaluation: Type of Study: FEES-Fiberoptic Endoscopic Evaluation of Swallow  Patient Details  Name: Jack Carter MRN: [id number] Date of Birth: Sep 20, 1933  Today's Date: 11/03/2016 Time: SLP Start Time (ACUTE ONLY): 1100-SLP Stop Time (ACUTE ONLY): 1122 SLP Time Calculation (min) (ACUTE ONLY): 22 min  Past Medical History: History reviewed. No pertinent past medical history. Past Surgical History: History reviewed. No pertinent surgical history. HPI: 83 yr old male with Hx of parkinson disease, CAD s/p CABG, CHF who presented with 1 week history of weakness and fall at home, was unresponsive and required intubation. Admitted with septic and cardiogenic shock secondary to cellulitis and hyperkalemia. Severe hyperkalemia from AKI, use of K and bactrim. Severe bradycardia from hyperkalemia. Acute hypoxemic respiratory failure. Severe metabolic encephalopathy. Intubated 8/31-9/2. SLP consulted for swallow evaluation; per MD pt has PO meds for PD, requesting if he can take orally.   Subjective: alert, confused   Assessment / Plan / Recommendation  CHL IP CLINICAL IMPRESSIONS 11/03/2016  Clinical Impression Patient presents with a moderate-severe oropharyngeal dysphagia, further exacerbated by lethargy and AMS. Patient initially alert, able to appropriately orally accept bolus with adequate labial seal, oral transit, and initiation of the swallow. Deep penetration noted with ice chips however patient able to protect the airway with both pureed solids and honey thick liquids via teaspoon with only mild vallecular residuals remaining post swallow. As study progressed however, patient became lethargic, eyes closed, decreased labial seal around spoon/cup, delayed oral transit, and an decrease in swallow initiation with premature spillage into the laryngeal vestibule pre- swallow, increased pharyngeal residuals post swallow, and penetration of liquid consistencies trialed. Patient deemed  unsafe to initiate a po diet at this time. Recommend NPO except necessary po meds crushed in minimal amount of puree when patient fully alert. Prognosis for abiity to advance diet good with improved mentation. Will f/u.  SLP Visit Diagnosis Dysphagia, oropharyngeal phase (R13.12)  Attention and concentration deficit following --  Frontal lobe and executive function deficit following --  Impact on safety and function Severe aspiration risk;Risk for inadequate nutrition/hydration      CHL IP TREATMENT RECOMMENDATION 11/03/2016  Treatment Recommendations Therapy as outlined in treatment plan below     Prognosis 11/03/2016  Prognosis for Safe Diet Advancement Good  Barriers to Reach Goals Cognitive deficits  Barriers/Prognosis Comment --    CHL IP DIET RECOMMENDATION 11/03/2016  SLP Diet Recommendations NPO except meds  Liquid Administration via --  Medication Administration Crushed with puree  Compensations --  Postural Changes --      CHL IP OTHER RECOMMENDATIONS 11/03/2016  Recommended Consults --  Oral Care Recommendations Oral care QID  Other Recommendations --      CHL IP FOLLOW UP RECOMMENDATIONS 11/03/2016  Follow up Recommendations Other (comment)      CHL IP FREQUENCY AND DURATION 11/03/2016  Speech Therapy Frequency (ACUTE ONLY) min 2x/week  Treatment Duration 2 weeks           CHL IP ORAL PHASE 11/03/2016  Oral Phase Impaired  Oral - Pudding Teaspoon --  Oral - Pudding Cup --  Oral - Honey Teaspoon Lingual pumping;Reduced posterior propulsion;Holding of bolus;Lingual/palatal residue;Piecemeal swallowing;Delayed oral transit;Decreased bolus cohesion;Premature spillage  Oral - Honey Cup Lingual pumping;Reduced posterior propulsion;Holding of bolus;Lingual/palatal residue;Piecemeal swallowing;Delayed oral transit;Decreased bolus cohesion;Premature spillage  Oral - Nectar Teaspoon Lingual pumping;Reduced posterior propulsion;Holding of bolus;Lingual/palatal residue;Piecemeal  swallowing;Delayed oral transit;Decreased bolus cohesion;Premature spillage  Oral - Nectar Cup --  Oral - Nectar Straw --  Oral -  Thin Teaspoon --  Oral - Thin Cup --  Oral - Thin Straw --  Oral - Puree Lingual pumping;Reduced posterior propulsion;Holding of bolus;Lingual/palatal residue;Piecemeal swallowing;Delayed oral transit;Decreased bolus cohesion;Premature spillage  Oral - Mech Soft --  Oral - Regular --  Oral - Multi-Consistency --  Oral - Pill --  Oral Phase - Comment flucutating abilities, worsened with lethargy as study progressed    CHL IP PHARYNGEAL PHASE 11/03/2016  Pharyngeal Phase Impaired  Pharyngeal- Pudding Teaspoon --  Pharyngeal --  Pharyngeal- Pudding Cup --  Pharyngeal --  Pharyngeal- Honey Teaspoon Delayed swallow initiation-vallecula;Delayed swallow initiation-pyriform sinuses;Reduced tongue base retraction;Pharyngeal residue - valleculae;Penetration/Aspiration during swallow;Penetration/Aspiration before swallow;Penetration/Apiration after swallow  Pharyngeal Material enters airway, remains ABOVE vocal cords and not ejected out  Pharyngeal- Honey Cup Delayed swallow initiation-vallecula;Delayed swallow initiation-pyriform sinuses;Reduced tongue base retraction;Pharyngeal residue - valleculae;Penetration/Aspiration during swallow;Penetration/Aspiration before swallow;Penetration/Apiration after swallow  Pharyngeal Material enters airway, remains ABOVE vocal cords and not ejected out  Pharyngeal- Nectar Teaspoon Delayed swallow initiation-vallecula;Delayed swallow initiation-pyriform sinuses;Reduced tongue base retraction;Pharyngeal residue - valleculae;Penetration/Aspiration during swallow;Penetration/Aspiration before swallow;Penetration/Apiration after swallow  Pharyngeal Material enters airway, remains ABOVE vocal cords and not ejected out  Pharyngeal- Nectar Cup --  Pharyngeal --  Pharyngeal- Nectar Straw --  Pharyngeal --  Pharyngeal- Thin Teaspoon --   Pharyngeal --  Pharyngeal- Thin Cup --  Pharyngeal --  Pharyngeal- Thin Straw --  Pharyngeal --  Pharyngeal- Puree Delayed swallow initiation-vallecula;Pharyngeal residue - valleculae;Reduced tongue base retraction  Pharyngeal Material does not enter airway  Pharyngeal- Mechanical Soft --  Pharyngeal --  Pharyngeal- Regular --  Pharyngeal --  Pharyngeal- Multi-consistency --  Pharyngeal --  Pharyngeal- Pill --  Pharyngeal --  Pharyngeal Comment --     No flowsheet data found.  Ferdinand LangoLeah Giana Castner MA, CCC-SLP [phone]  Nami Strawder Meryl 11/03/2016, 11:26 AM

## 2016-11-03 NOTE — Progress Notes (Signed)
Pharmacy Antibiotic Note  Jack Carter is a 83 y.o. male admitted on 10/31/2016 with sepsis.  Pharmacy has been consulted for Unasyn dosing. Patient has received vancomycin/zosyn for sepsis from cellulitis/potential concern for aspiration pneumonia.  WBC today WNL and Tmax 24- 100.2. Scr improved to 2.20 today and CrCl ~ 29 ml/min.   Plan: Unasyn 3 gm every 12 hours Monitor renal function  Monitor infectious work-up and length of therapy  Height: 5\' 9"  (175.3 cm) Weight: 189 lb 13.1 oz (86.1 kg) IBW/kg (Calculated) : 70.7  Temp (24hrs), Avg:99.7 F (37.6 C), Min:99.3 F (37.4 C), Max:100.2 F (37.9 C)   Recent Labs Lab 10/31/16 1936  10/31/16 2230 11/01/16 0305 11/01/16 0923 11/01/16 1513 11/01/16 1938 11/02/16 0316 11/02/16 1100 11/02/16 1745 11/03/16 0400  WBC  --   < >  --  9.8 8.3 12.0* 10.8* 10.4 10.3 9.4 8.1  CREATININE  --   < >  --  3.09* 3.02* 3.15*  --  2.67*  --   --  2.20*  LATICACIDVEN 2.05*  --  4.11*  --   --   --   --   --   --   --   --   < > = values in this interval not displayed.  Estimated Creatinine Clearance: 28.6 mL/min (A) (by C-G formula based on SCr of 2.2 mg/dL (H)).    No Known Allergies  Antimicrobials this admission: 8/31 Vanc X 1 dose 8/31 Zosyn >>9/3 9/3 Unasyn>>   Microbiology results: 8/31 Blood x 2>> ngtd 9/1 Urine>> No growth  Thank you for allowing pharmacy to be a part of this patient's care.  Della GooEmily S Sinclair, PharmD PGY2 Infectious Diseases Pharmacy Resident Pager: [phone]  11/03/2016 10:36 AM

## 2016-11-03 NOTE — Progress Notes (Signed)
eLink Physician-Brief Progress Note Patient Name: Jack Carter DOB: 05/21/1933 MRN: [id number]   Date of Service  11/03/2016  HPI/Events of Note  K+ = 2.8 and Creatinine = 2.2.  eICU Interventions  Will replace K+.      Intervention Category Major Interventions: Electrolyte abnormality - evaluation and management  Sommer,Steven Eugene 11/03/2016, 5:09 AM

## 2016-11-03 NOTE — Progress Notes (Signed)
Patient had a large bag of multiple home medications in room that patient's daughter, Idalia Needleaige had brought into the hospital. Meds brought down to pharmacy. Idalia Needleaige said she would be able to pick up the medications tomorrow (9/4).

## 2016-11-03 NOTE — Progress Notes (Signed)
ANTICOAGULATION CONSULT NOTE - Initial Consult  Pharmacy Consult for heparin  Indication: VTE prophylaxis  No Known Allergies  Patient Measurements: Height: 5\' 9"  (175.3 cm) Weight: 189 lb 13.1 oz (86.1 kg) IBW/kg (Calculated) : 70.7   Vital Signs: Temp: 99.9 F (37.7 C) (09/03 1000) Temp Source: Core (Comment) (09/03 0400) BP: 141/75 (09/03 1000)  Labs:  Recent Labs  10/31/16 1921  11/01/16 1513  11/02/16 0316 11/02/16 1100 11/02/16 1745 11/03/16 0400  HGB  --   < > 11.5*  < > 10.8* 10.5* 10.3* 10.4*  HCT  --   < > 36.1*  < > 34.8* 33.9* 33.7* 32.8*  PLT  --   < > 155  < > 131* 119* 117* 118*  APTT 21*  --   --   --   --   --   --   --   LABPROT 14.1  --   --   --   --   --   --   --   INR 1.10  --   --   --   --   --   --   --   CREATININE 2.99*  < > 3.15*  --  2.67*  --   --  2.20*  TROPONINI 0.13*  --   --   --   --   --   --   --   < > = values in this interval not displayed.  Estimated Creatinine Clearance: 28.6 mL/min (A) (by C-G formula based on SCr of 2.2 mg/dL (H)).  Assessment: 83 yo male admitted 8/31 with altered mental status and fall. Anticoagulation was held due to mild anemia. Patients CBC has now been stable for several days and pharmacy has been consulted to start heparin for DVT prophylaxis. Patients platelet count is low but stable and no signs of bleeding have been noted.   Goal of Therapy:  Monitor platelets by anticoagulation protocol: Yes   Plan:  Heparin 5000 units SQ every 8 hours for VTE prophylaxis Monitor platelets Pharmacy will sign off and monitor peripherally Please re-consult if needed  Della GooEmily S Sinclair, PharmD PGY2 Infectious Diseases Pharmacy Resident Pager: [phone]  11/03/2016,10:19 AM

## 2016-11-03 NOTE — Progress Notes (Signed)
  Echocardiogram 2D Echocardiogram has been performed.  Jack Carter 11/03/2016, 9:23 AM

## 2016-11-04 ENCOUNTER — Encounter (HOSPITAL_COMMUNITY): Payer: Self-pay | Admitting: Interventional Cardiology

## 2016-11-04 ENCOUNTER — Inpatient Hospital Stay (HOSPITAL_COMMUNITY): Payer: Medicare Other

## 2016-11-04 ENCOUNTER — Ambulatory Visit: Payer: Self-pay | Admitting: Internal Medicine

## 2016-11-04 DIAGNOSIS — Z7189 Other specified counseling: Secondary | ICD-10-CM

## 2016-11-04 DIAGNOSIS — R7989 Other specified abnormal findings of blood chemistry: Secondary | ICD-10-CM

## 2016-11-04 DIAGNOSIS — G9340 Encephalopathy, unspecified: Secondary | ICD-10-CM

## 2016-11-04 DIAGNOSIS — R778 Other specified abnormalities of plasma proteins: Secondary | ICD-10-CM | POA: Diagnosis present

## 2016-11-04 DIAGNOSIS — I5041 Acute combined systolic (congestive) and diastolic (congestive) heart failure: Secondary | ICD-10-CM

## 2016-11-04 LAB — POCT I-STAT 3, ART BLOOD GAS (G3+)
ACID-BASE DEFICIT: 9 mmol/L — AB (ref 0.0–2.0)
BICARBONATE: 16.8 mmol/L — AB (ref 20.0–28.0)
Bicarbonate: 24.4 mmol/L (ref 20.0–28.0)
O2 SAT: 38 %
O2 Saturation: 99 %
PH ART: 7.438 (ref 7.350–7.450)
TCO2: 18 mmol/L — ABNORMAL LOW (ref 22–32)
TCO2: 25 mmol/L (ref 22–32)
pCO2 arterial: 32.1 mmHg (ref 32.0–48.0)
pCO2 arterial: 36.1 mmHg (ref 32.0–48.0)
pH, Arterial: 7.319 — ABNORMAL LOW (ref 7.350–7.450)
pO2, Arterial: 22 mmHg — CL (ref 83.0–108.0)
pO2, Arterial: 139 mmHg — ABNORMAL HIGH (ref 83.0–108.0)

## 2016-11-04 LAB — BASIC METABOLIC PANEL
ANION GAP: 8 (ref 5–15)
Anion gap: 10 (ref 5–15)
BUN: 22 mg/dL — AB (ref 6–20)
BUN: 22 mg/dL — ABNORMAL HIGH (ref 6–20)
CALCIUM: 7.5 mg/dL — AB (ref 8.9–10.3)
CHLORIDE: 115 mmol/L — AB (ref 101–111)
CO2: 23 mmol/L (ref 22–32)
CO2: 24 mmol/L (ref 22–32)
CREATININE: 1.78 mg/dL — AB (ref 0.61–1.24)
Calcium: 7.6 mg/dL — ABNORMAL LOW (ref 8.9–10.3)
Chloride: 114 mmol/L — ABNORMAL HIGH (ref 101–111)
Creatinine, Ser: 1.96 mg/dL — ABNORMAL HIGH (ref 0.61–1.24)
GFR calc Af Amer: 35 mL/min — ABNORMAL LOW (ref >60)
GFR calc non Af Amer: 34 mL/min — ABNORMAL LOW (ref >60)
GFR calc non Af Amer: 30 mL/min — ABNORMAL LOW (ref >60)
GFR, EST AFRICAN AMERICAN: 39 mL/min — AB (ref >60)
GLUCOSE: 126 mg/dL — AB (ref 65–99)
GLUCOSE: 125 mg/dL — AB (ref 65–99)
Potassium: 2.8 mmol/L — ABNORMAL LOW (ref 3.5–5.1)
Potassium: 3.6 mmol/L (ref 3.5–5.1)
Sodium: 148 mmol/L — ABNORMAL HIGH (ref 135–145)
Sodium: 146 mmol/L — ABNORMAL HIGH (ref 135–145)

## 2016-11-04 LAB — PHOSPHORUS: PHOSPHORUS: 1.8 mg/dL — AB (ref 2.5–4.6)

## 2016-11-04 LAB — CBC
HEMATOCRIT: 32.8 % — AB (ref 39.0–52.0)
HEMOGLOBIN: 10.4 g/dL — AB (ref 13.0–17.0)
MCH: 30.5 pg (ref 26.0–34.0)
MCHC: 31.7 g/dL (ref 30.0–36.0)
MCV: 96.2 fL (ref 78.0–100.0)
Platelets: 126 10*3/uL — ABNORMAL LOW (ref 150–400)
RBC: 3.41 MIL/uL — ABNORMAL LOW (ref 4.22–5.81)
RDW: 16 % — ABNORMAL HIGH (ref 11.5–15.5)
WBC: 7.4 10*3/uL (ref 4.0–10.5)

## 2016-11-04 LAB — TROPONIN I
TROPONIN I: 4.81 ng/mL — AB (ref <0.03)
TROPONIN I: 3.33 ng/mL — AB (ref <0.03)
Troponin I: 3.61 ng/mL (ref <0.03)

## 2016-11-04 LAB — GLUCOSE, CAPILLARY
GLUCOSE-CAPILLARY: 127 mg/dL — AB (ref 65–99)
Glucose-Capillary: 130 mg/dL — ABNORMAL HIGH (ref 65–99)
Glucose-Capillary: 154 mg/dL — ABNORMAL HIGH (ref 65–99)
Glucose-Capillary: 166 mg/dL — ABNORMAL HIGH (ref 65–99)

## 2016-11-04 LAB — MAGNESIUM
Magnesium: 1.4 mg/dL — ABNORMAL LOW (ref 1.7–2.4)
Magnesium: 2.2 mg/dL (ref 1.7–2.4)

## 2016-11-04 LAB — STREP PNEUMONIAE URINARY ANTIGEN: Strep Pneumo Urinary Antigen: NEGATIVE

## 2016-11-04 MED ORDER — FUROSEMIDE 10 MG/ML IJ SOLN
INTRAMUSCULAR | Status: AC
  Filled 2016-11-04: qty 4

## 2016-11-04 MED ORDER — INSULIN ASPART 100 UNIT/ML ~~LOC~~ SOLN
0.0000 [IU] | SUBCUTANEOUS | Status: DC
  Administered 2016-11-04 (×2): 1 [IU] via SUBCUTANEOUS
  Administered 2016-11-04: 2 [IU] via SUBCUTANEOUS
  Administered 2016-11-05: 1 [IU] via SUBCUTANEOUS
  Administered 2016-11-05: 2 [IU] via SUBCUTANEOUS
  Administered 2016-11-05: 1 [IU] via SUBCUTANEOUS
  Administered 2016-11-05: 2 [IU] via SUBCUTANEOUS
  Administered 2016-11-06 – 2016-11-07 (×5): 1 [IU] via SUBCUTANEOUS
  Administered 2016-11-07: 2 [IU] via SUBCUTANEOUS
  Administered 2016-11-07 – 2016-11-08 (×3): 1 [IU] via SUBCUTANEOUS
  Administered 2016-11-08: 2 [IU] via SUBCUTANEOUS
  Administered 2016-11-09: 1 [IU] via SUBCUTANEOUS
  Administered 2016-11-09 (×2): 2 [IU] via SUBCUTANEOUS
  Administered 2016-11-10: 1 [IU] via SUBCUTANEOUS
  Administered 2016-11-10: 2 [IU] via SUBCUTANEOUS
  Administered 2016-11-11 – 2016-11-12 (×3): 1 [IU] via SUBCUTANEOUS
  Administered 2016-11-12 (×2): 2 [IU] via SUBCUTANEOUS
  Administered 2016-11-12 – 2016-11-15 (×12): 1 [IU] via SUBCUTANEOUS
  Administered 2016-11-15: 2 [IU] via SUBCUTANEOUS
  Administered 2016-11-15 – 2016-11-17 (×7): 1 [IU] via SUBCUTANEOUS
  Administered 2016-11-17: 2 [IU] via SUBCUTANEOUS
  Administered 2016-11-17 (×3): 1 [IU] via SUBCUTANEOUS
  Administered 2016-11-17 (×2): 2 [IU] via SUBCUTANEOUS
  Administered 2016-11-18 (×3): 1 [IU] via SUBCUTANEOUS
  Administered 2016-11-18: 2 [IU] via SUBCUTANEOUS

## 2016-11-04 MED ORDER — AMANTADINE HCL 50 MG/5ML PO SYRP
100.0000 mg | ORAL_SOLUTION | Freq: Three times a day (TID) | ORAL | Status: DC
  Administered 2016-11-04 – 2016-11-05 (×2): 100 mg
  Filled 2016-11-04 (×2): qty 10

## 2016-11-04 MED ORDER — ROCURONIUM BROMIDE 50 MG/5ML IV SOLN
50.0000 mg | Freq: Once | INTRAVENOUS | Status: AC
  Administered 2016-11-04: 50 mg via INTRAVENOUS
  Filled 2016-11-04: qty 5

## 2016-11-04 MED ORDER — ALBUTEROL SULFATE (2.5 MG/3ML) 0.083% IN NEBU
2.5000 mg | INHALATION_SOLUTION | RESPIRATORY_TRACT | Status: DC
  Administered 2016-11-04 (×2): 2.5 mg via RESPIRATORY_TRACT
  Filled 2016-11-04 (×2): qty 3

## 2016-11-04 MED ORDER — BISACODYL 10 MG RE SUPP: 10.0000 mg | Freq: Every day | RECTAL | Status: DC | PRN

## 2016-11-04 MED ORDER — FENTANYL CITRATE (PF) 100 MCG/2ML IJ SOLN
100.0000 ug | Freq: Once | INTRAMUSCULAR | Status: AC
  Administered 2016-11-04: 100 ug via INTRAVENOUS

## 2016-11-04 MED ORDER — MIDAZOLAM HCL 2 MG/2ML IJ SOLN
1.0000 mg | INTRAMUSCULAR | Status: DC | PRN
  Administered 2016-11-04 (×2): 1 mg via INTRAVENOUS
  Filled 2016-11-04 (×2): qty 2

## 2016-11-04 MED ORDER — MAGNESIUM SULFATE 50 % IJ SOLN
3.0000 g | Freq: Once | INTRAVENOUS | Status: AC
  Administered 2016-11-04: 3 g via INTRAVENOUS
  Filled 2016-11-04 (×2): qty 6

## 2016-11-04 MED ORDER — FENTANYL CITRATE (PF) 100 MCG/2ML IJ SOLN: 50.0000 ug | Freq: Once | INTRAMUSCULAR | Status: DC

## 2016-11-04 MED ORDER — PNEUMOCOCCAL VAC POLYVALENT 25 MCG/0.5ML IJ INJ
0.5000 mL | INJECTION | INTRAMUSCULAR | Status: DC
  Filled 2016-11-04: qty 0.5

## 2016-11-04 MED ORDER — PIPERACILLIN-TAZOBACTAM 3.375 G IVPB
3.3750 g | Freq: Three times a day (TID) | INTRAVENOUS | Status: DC
  Administered 2016-11-04 – 2016-11-11 (×21): 3.375 g via INTRAVENOUS
  Filled 2016-11-04 (×23): qty 50

## 2016-11-04 MED ORDER — ETOMIDATE 2 MG/ML IV SOLN
20.0000 mg | Freq: Once | INTRAVENOUS | Status: AC
  Administered 2016-11-04: 20 mg via INTRAVENOUS

## 2016-11-04 MED ORDER — FENTANYL CITRATE (PF) 100 MCG/2ML IJ SOLN
INTRAMUSCULAR | Status: AC
  Filled 2016-11-04: qty 2

## 2016-11-04 MED ORDER — CARBIDOPA-LEVODOPA 25-100 MG PO TABS
1.0000 | ORAL_TABLET | Freq: Three times a day (TID) | ORAL | Status: DC
  Filled 2016-11-04 (×3): qty 1

## 2016-11-04 MED ORDER — VITAL AF 1.2 CAL PO LIQD
1000.0000 mL | ORAL | Status: DC
  Administered 2016-11-04: 1000 mL

## 2016-11-04 MED ORDER — FENTANYL 2500MCG IN NS 250ML (10MCG/ML) PREMIX INFUSION
25.0000 ug/h | INTRAVENOUS | Status: DC
  Administered 2016-11-04: 50 ug/h via INTRAVENOUS
  Administered 2016-11-05: 75 ug/h via INTRAVENOUS
  Filled 2016-11-04 (×2): qty 250

## 2016-11-04 MED ORDER — FENTANYL BOLUS VIA INFUSION
25.0000 ug | INTRAVENOUS | Status: DC | PRN
  Filled 2016-11-04: qty 25

## 2016-11-04 MED ORDER — ASPIRIN EC 81 MG PO TBEC
81.0000 mg | DELAYED_RELEASE_TABLET | Freq: Every day | ORAL | Status: DC
  Filled 2016-11-04: qty 1

## 2016-11-04 MED ORDER — MIDAZOLAM HCL 2 MG/2ML IJ SOLN
2.0000 mg | Freq: Once | INTRAMUSCULAR | Status: AC
  Administered 2016-11-04: 2 mg via INTRAVENOUS

## 2016-11-04 MED ORDER — ALBUTEROL SULFATE (2.5 MG/3ML) 0.083% IN NEBU
2.5000 mg | INHALATION_SOLUTION | Freq: Four times a day (QID) | RESPIRATORY_TRACT | Status: DC
  Administered 2016-11-04 – 2016-11-05 (×3): 2.5 mg via RESPIRATORY_TRACT
  Filled 2016-11-04 (×3): qty 3

## 2016-11-04 MED ORDER — POTASSIUM CHLORIDE 10 MEQ/50ML IV SOLN
10.0000 meq | INTRAVENOUS | Status: AC
  Administered 2016-11-04 (×4): 10 meq via INTRAVENOUS
  Filled 2016-11-04 (×4): qty 50

## 2016-11-04 MED ORDER — SODIUM CHLORIDE 0.9% FLUSH: 10.0000 mL | INTRAVENOUS | Status: DC | PRN

## 2016-11-04 MED ORDER — PRO-STAT SUGAR FREE PO LIQD: 30.0000 mL | Freq: Two times a day (BID) | ORAL | Status: DC

## 2016-11-04 MED ORDER — MIDAZOLAM HCL 2 MG/2ML IJ SOLN
INTRAMUSCULAR | Status: AC
  Filled 2016-11-04: qty 2

## 2016-11-04 MED ORDER — DOCUSATE SODIUM 50 MG/5ML PO LIQD: 100.0000 mg | Freq: Two times a day (BID) | ORAL | Status: DC | PRN

## 2016-11-04 MED ORDER — DEXMEDETOMIDINE HCL 200 MCG/2ML IV SOLN
0.4000 ug/kg/h | INTRAVENOUS | Status: DC
  Administered 2016-11-04: 0.4 ug/kg/h via INTRAVENOUS
  Administered 2016-11-04: 0.7 ug/kg/h via INTRAVENOUS
  Filled 2016-11-04 (×3): qty 2

## 2016-11-04 MED ORDER — FREE WATER
200.0000 mL | Freq: Four times a day (QID) | Status: DC
  Administered 2016-11-04 – 2016-11-05 (×3): 200 mL

## 2016-11-04 MED ORDER — POTASSIUM CHLORIDE 10 MEQ/100ML IV SOLN
10.0000 meq | INTRAVENOUS | Status: DC
  Administered 2016-11-04: 10 meq via INTRAVENOUS
  Filled 2016-11-04: qty 100

## 2016-11-04 MED ORDER — VANCOMYCIN HCL 10 G IV SOLR
1250.0000 mg | INTRAVENOUS | Status: DC
  Administered 2016-11-04 – 2016-11-05 (×2): 1250 mg via INTRAVENOUS
  Filled 2016-11-04 (×3): qty 1250

## 2016-11-04 MED ORDER — ORAL CARE MOUTH RINSE
15.0000 mL | Freq: Four times a day (QID) | OROMUCOSAL | Status: DC
  Administered 2016-11-04 – 2016-11-05 (×6): 15 mL via OROMUCOSAL

## 2016-11-04 MED ORDER — FUROSEMIDE 10 MG/ML IJ SOLN
40.0000 mg | Freq: Once | INTRAMUSCULAR | Status: AC
Start: 2016-11-04 — End: 2016-11-04
  Administered 2016-11-04: 40 mg via INTRAVENOUS

## 2016-11-04 MED ORDER — CHLORHEXIDINE GLUCONATE 0.12% ORAL RINSE (MEDLINE KIT)
15.0000 mL | Freq: Two times a day (BID) | OROMUCOSAL | Status: DC
  Administered 2016-11-04 – 2016-11-05 (×3): 15 mL via OROMUCOSAL

## 2016-11-04 MED ORDER — WHITE PETROLATUM GEL
Status: AC
  Administered 2016-11-04: 07:00:00
  Filled 2016-11-04: qty 1

## 2016-11-04 NOTE — Consult Note (Addendum)
Cardiology Consultation:   Patient ID: Jack Carter; [id number]; 1933-04-06   Admit date: 10/31/2016 Date of Consult: 11/04/2016  Primary Care Provider: No primary care provider on file. Primary Cardiologist: New Primary Electrophysiologist:  None   Patient Profile:   Jack Carter is a 83 y.o. male with a hx of prior CABG and elevated cardiac markers who is being seen today for the evaluation of elevated troponin at the request of Dr. Alfonso Patten. [facility].  History of Present Illness:   Jack Carter 83 years of age, recently relocated to [place] from [place], and was admitted on 10/31/2016 because of progressive fatigue, confusion,hyperkalemia, bradycardia,and hypoxic failure requiring mechanical ventilation. Has history of cellulitis and septic shock during this admission.  We are asked to consult today because of sudden respiratory distress after a brief period of agitation.He also has an elevated troponin I greater than 4. No complaint of chest pain. He has prior coronary bypass grafting but bypass graft number and sitesis unknown.  Past Medical History:  Diagnosis Date  . Coronary artery disease    CABG  . Essential hypertension   . Hyperlipidemia   . Parkinson's disease ([place])   . Restless leg syndrome     Past Surgical History:  Procedure Laterality Date  . CORONARY ARTERY BYPASS GRAFT       Home Medications:  Prior to Admission medications   Medication Sig Start Date End Date Taking? Authorizing Provider  amantadine (SYMMETREL) 100 MG capsule Take 100 mg by mouth 3 (three) times daily.   Yes [provider]  aspirin EC 81 MG tablet Take 81 mg by mouth daily.   Yes [provider]  atorvastatin (LIPITOR) 10 MG tablet Take 10 mg by mouth at bedtime.   Yes [provider]  carbidopa-levodopa (SINEMET IR) 25-100 MG tablet Take 1 tablet by mouth 2 (two) times daily.   Yes [provider]    carbidopa-levodopa-entacapone (STALEVO) 50-200-200 MG tablet Take 1 tablet by mouth 2 (two) times daily.   Yes [provider]  furosemide (LASIX) 40 MG tablet Take 80 mg by mouth daily.   Yes [provider]  gabapentin (NEURONTIN) 600 MG tablet Take 600 mg by mouth 4 (four) times daily.   Yes [provider]  metolazone (ZAROXOLYN) 5 MG tablet Take 5 mg by mouth every Monday, Wednesday, and Friday.   Yes [provider]  Multiple Vitamins-Minerals (CENTRUM SILVER 50+MEN) TABS Take 1 tablet by mouth daily.   Yes [provider]  omeprazole (PRILOSEC) 40 MG capsule Take 40 mg by mouth daily.   Yes [provider]  potassium chloride SA (K-DUR,KLOR-CON) 20 MEQ tablet Take 20-40 mEq by mouth See admin instructions. 40 mEq in the morning and 20 mEq in the afternoon   Yes [provider]  primidone (MYSOLINE) 50 MG tablet Take 50 mg by mouth 2 (two) times daily.   Yes [provider]  propranolol (INDERAL) 60 MG tablet Take 60 mg by mouth 2 (two) times daily.   Yes [provider]  rOPINIRole (REQUIP) 4 MG tablet Take 4 mg by mouth 2 (two) times daily.   Yes [provider]  traMADol (ULTRAM) 50 MG tablet Take 50 mg by mouth 3 (three) times daily.   Yes [provider]  trimethobenzamide (TIGAN) 300 MG capsule Take 300 mg by mouth 3 (three) times daily as needed for nausea/vomiting.   Yes [provider]  valsartan (DIOVAN) 160 MG tablet  Take 160 mg by mouth 2 (two) times daily.   Yes [provider]    Inpatient Medications: Scheduled Meds: . albuterol  2.5 mg Nebulization Q4H  . amantadine  100 mg Oral TID  . aspirin EC  81 mg Oral Daily  . carbidopa-levodopa  1 tablet Oral TID  . chlorhexidine gluconate (MEDLINE KIT)  15 mL Mouth Rinse BID  . fentaNYL (SUBLIMAZE) injection  50 mcg Intravenous Once  . furosemide      . heparin subcutaneous  5,000 Units Subcutaneous Q8H  .  mouth rinse  15 mL Mouth Rinse QID  . pantoprazole  40 mg Intravenous Q12H  . [START ON 11/05/2016] pneumococcal 23 valent vaccine  0.5 mL Intramuscular Tomorrow-1000  . primidone  50 mg Oral Q12H   Continuous Infusions: . sodium chloride    . ampicillin-sulbactam (UNASYN) IV Stopped (11/04/16 0155)  . dextrose 75 mL/hr at 11/04/16 0254  . fentaNYL infusion INTRAVENOUS    . magnesium sulfate LVP 250-500 ml    . potassium chloride     PRN Meds: [CANCELED] Place/Maintain arterial line **AND** sodium chloride, atropine, bisacodyl, calcium chloride, dextrose, docusate, fentaNYL, midazolam, sodium bicarbonate, sodium chloride flush  Allergies:   No Known Allergies  Social History:   Social History   Social History  . Marital status: Married    Spouse name: N/A  . Number of children: N/A  . Years of education: N/A   Occupational History  . Not on file.   Social History Main Topics  . Smoking status: Former Research scientist (life sciences)  . Smokeless tobacco: Never Used  . Alcohol use Not on file  . Drug use: Unknown  . Sexual activity: Not on file   Other Topics Concern  . Not on file   Social History Narrative  . No narrative on file    Family History:   No family history is available at this time. Patient is to acutely ill to provide any additional data.  ROS:  Please see the history of present illness.  Review of Systems  Constitution: Positive for diaphoresis.  Respiratory: Positive for shortness of breath and wheezing.   Musculoskeletal: Positive for arthritis.  Gastrointestinal: Negative.   Genitourinary: Negative.     Currently denies chest pain, abdominal pain but is able to follow commands .All other ROS reviewed and negative.     Physical Exam/Data:   Vitals:   11/03/16 1919 11/03/16 2015 11/04/16 0500 11/04/16 1010  BP:  (!) 147/67 135/65 (!) 166/63  Pulse:  (!) 111 90 (!) 133  Resp:  18 18 (!) 36  Temp: 98.3 F (36.8 C) 99.3 F (37.4 C) 98.6 F (37 C)   TempSrc:    Oral   SpO2:  96% 96% 91%  Weight:   195 lb 5.2 oz (88.6 kg)   Height:        Intake/Output Summary (Last 24 hours) at 11/04/16 1055 Last data filed at 11/04/16 0900  Gross per 24 hour  Intake          1638.75 ml  Output              550 ml  Net          1088.75 ml   Filed Weights   11/02/16 0500 11/03/16 0408 11/04/16 0500  Weight: 199 lb 1.2 oz (90.3 kg) 189 lb 13.1 oz (86.1 kg) 195 lb 5.2 oz (88.6 kg)   Body mass index is 28.84 kg/m.  General:  obese, well developed, in in  acute severe respiratory distress. Skin is clammy. Respiratory rate 26/m HEENT: difficult to perform. Currently on high flow oxygen Lymph: no adenopathy Neck: unable to appropriately assess JVD Endocrine:  No thryomegaly Vascular: No carotid bruits; FA pulses 2+ bilaterally without bruits  Cardiac:  Tachycardic,without obvious murmur. Lungs:  clear to auscultation bilaterally, no wheezing, rhonchi or rales  Abd: soft, nontender, no hepatomegaly  Ext: no edema Musculoskeletal:  No deformities, BUE and BLE strength normal and equal Skin: warm and dry  Neuro:  CNs 2-12 intact, no focal abnormalities noted. He is awake and able to follow commands. Psych:  Normal affect   EKG:  The EKG was personally reviewed and demonstrates:  Sinus tachycardia, poor R-wave progression with J-point depression V2 through V4. Small inferior Q waves noted.interventricular conduction delay. Telemetry:  Telemetry was personally reviewed and demonstrates:  Sinus tachycardia and episodes of wide complex tachycardia consistent with VT.  Relevant CV Studies: Echocardiogram 11/03/2016: Study Conclusions  - Left ventricle: The cavity size was normal. Systolic function was   mildly to moderately reduced. The estimated ejection fraction was   in the range of 40% to 45%. Features are consistent with a   pseudonormal left ventricular filling pattern, with concomitant   abnormal relaxation and increased filling pressure (grade 2    diastolic dysfunction). - Ventricular septum: The contour showed diastolic flattening and   systolic flattening. These changes are consistent with RV volume   and pressure overload. - Aortic valve: There was trivial regurgitation. - Mitral valve: There was mild to moderate regurgitation directed   centrally. - Left atrium: The atrium was moderately dilated. - Right ventricle: The cavity size was moderately dilated. Wall   thickness was normal. Systolic function was moderately reduced. - Right atrium: The atrium was moderately dilated. - Atrial septum: No defect or patent foramen ovale was identified. - Tricuspid valve: There was malcoaptation of the valve leaflets.   There was severe regurgitation directed centrally. - Pulmonary arteries: Systolic pressure was moderately increased.   PA peak pressure: 60 mm Hg (S).  Recommendations:  Recommend repeat imaging with Definity for better evaluation of left ventricular wall motion and overall systolic function.  Laboratory Data:  Chemistry Recent Labs Lab 11/02/16 0316  11/02/16 1745 11/03/16 0400 11/04/16 0447  NA 148*  --   --  149* 148*  K 3.7  < > 3.0* 2.8* 2.8*  CL 115*  --   --  116* 115*  CO2 24  --   --  28 23  GLUCOSE 137*  --   --  99 126*  BUN 37*  --   --  28* 22*  CREATININE 2.67*  --   --  2.20* 1.78*  CALCIUM 7.4*  --   --  7.4* 7.6*  GFRNONAA 21*  --   --  26* 34*  GFRAA 24*  --   --  31* 39*  ANIONGAP 9  --   --  5 10  < > = values in this interval not displayed.   Recent Labs Lab 10/31/16 1921  PROT 7.1  ALBUMIN 3.2*  AST 57*  ALT 27  ALKPHOS 195*  BILITOT 1.3*   Hematology  Recent Labs Lab 11/03/16 0400 11/03/16 0916 11/04/16 0447  WBC 8.1 8.1 7.4  RBC 3.35* 3.49* 3.41*  HGB 10.4* 10.7* 10.4*  HCT 32.8* 34.3* 32.8*  MCV 97.9 98.3 96.2  MCH 31.0 30.7 30.5  MCHC 31.7 31.2 31.7  RDW 16.1* 16.8* 16.0*  PLT 118* 138*  126*   Cardiac Enzymes  Recent Labs Lab 10/31/16 1921  11/04/16 0447  TROPONINI 0.13* 4.81*     Recent Labs Lab 10/31/16 1952  TROPIPOC 0.11*    BNP  Recent Labs Lab 10/31/16 2202  BNP 205.9*    DDimer No results for input(s): DDIMER in the last 168 hours.  Radiology/Studies:  Ct Head Wo Contrast  Result Date: 11/01/2016 CLINICAL DATA:  83 y/o M; unwitnessed fall at home. No noticeable head trauma. EXAM: CT HEAD WITHOUT CONTRAST TECHNIQUE: Contiguous axial images were obtained from the base of the skull through the vertex without intravenous contrast. COMPARISON:  None. FINDINGS: Brain: No evidence of acute infarction, hemorrhage, hydrocephalus, extra-axial collection or mass lesion/mass effect. Few hospice foci of hypoattenuation in subcortical and periventricular white matter are compatible with mild chronic microvascular ischemic changes. Moderate diffuse parenchymal volume loss. Vascular: Calcific atherosclerosis of carotid siphons. No hyperdense vessel identified. Skull: Normal. Negative for fracture or focal lesion. Sinuses/Orbits: Paranasal sinus mucosal thickening and fluid within the nasopharynx is likely due to intubation. Chronic inspissated opacification of the right maxillary sinus. Normal aeration of the mastoid air cells. Debris in external auditory canals, likely cerumen. Other: Choose IMPRESSION: 1. No acute intracranial abnormality identified. No calvarial fracture. 2. Mild chronic microvascular ischemic changes and moderate parenchymal volume loss of the brain. 3. Chronic inspissated opacification of right maxillary sinus. Electronically Signed   By: Kristine Garbe M.D.   On: 11/01/2016 00:30   [place] Retroperitoneal (renal,aorta,ivc Nodes)  Result Date: 10/31/2016 CLINICAL DATA:  Acute renal injury EXAM: RENAL / URINARY TRACT ULTRASOUND COMPLETE COMPARISON:  None. FINDINGS: Right Kidney: Length: 10.1 cm. Echogenicity within normal limits. No mass or hydronephrosis visualized. Left Kidney: Length: 10.6 cm. Echogenicity  within normal limits. No mass or hydronephrosis visualized. Bladder: Decompressed by Foley catheter. IMPRESSION: Maintained cortical-medullary distinction within both kidneys without obstructive uropathy. Decompressed bladder with indwelling Foley catheter. Electronically Signed   By: Ashley Royalty M.D.   On: 10/31/2016 22:25   Dg Chest Port 1 View  Result Date: 11/04/2016 CLINICAL DATA:  Cough and shortness of breath. EXAM: PORTABLE CHEST 1 VIEW COMPARISON:  10/31/2016 FINDINGS: Endotracheal tube and nasogastric tube have been removed. Previous median sternotomy and CABG. Heart size remains upper limits of normal. Aortic atherosclerosis again noted. Newly seen infiltrates in both lungs, more pronounced in the right upper lobe and in the left lower lobe, most consistent with bronchopneumonia. The may be a small amount of pleural fluid on the left. No acute bone finding. IMPRESSION: Newly seen bilateral infiltrates consistent with bronchopneumonia. Electronically Signed   By: Nelson Chimes M.D.   On: 11/04/2016 07:27   Dg Chest Portable 1 View  Result Date: 10/31/2016 CLINICAL DATA:  Post intubation. EXAM: PORTABLE CHEST 1 VIEW COMPARISON:  None. FINDINGS: An endotracheal tube is identified with tip 2.8 cm above the carina. An NG tube is identified entering the stomach with tip off the field of view. Defibrillator pads overlying the chest are noted. Cardiomegaly identified. No airspace disease, definite pleural effusion or pneumothorax noted. IMPRESSION: Support apparatus as described. Cardiomegaly without evidence of acute cardiopulmonary disease. Electronically Signed   By: Margarette Canada M.D.   On: 10/31/2016 19:59    Assessment and Plan:   1. Elevated troponin in this setting likely represents non-ST elevation myocardial infarction from either plaque rupture or supply demand mismatch. 2. Coronary artery disease with prior bypass grafting. No data concerning historical cardiac status is available 3. Acute  hypoxic respiratory failure  possibly related to aspiration and/or CHF requiring reintubation. 4. Parkinson's disease with difficulty swallowing contributing to probable aspiration. 5. Acute on chronic kidney failure, improving but with at least stage III disease at baseline. 6. Acute combined systolic and diastolic heart failure. If blood pressure remains stable, diuretic therapy should be considered. BNP will be checked.  Plan serial cardiac markers, reintubation, consider repeat echocardiogram, discussion of CODE STATUSwith family, attended to obtain additional information from [place]. Overall prognosis is guarded at this time.   Signed, Sinclair Grooms, MD  11/04/2016 10:55 AM

## 2016-11-04 NOTE — Progress Notes (Signed)
Name: Jack MiniumCharles L Carter MRN:   [id number] DOB:   06/21/1933             ADMISSION DATE:  10/31/2016 CONSULTATION DATE:  10/31/2016  REFERRING MD:  ED  CHIEF COMPLAINT:  Altered mental status  HISTORY OF PRESENT ILLNESS:   83 yr old male with Hx of parkinson disease, CAD s/p CABG, CHF who is coming after as per his daughter him being very weak in the last week. Today he was in the bathroom and she all of a sudden heard a thump and him screaming. He told her that he fell and maybe bumped his head but not hard. He did not lose consciousness. He tried to stand up again and he couldn't so she let him down and then shortly he started becoming unconscious.   As per daughter he has cellulitis of his legs and he has been feeling weak in the last week and though he might not be taking his medicines. He just moved from [place] so we dont have much info and she does not know much of his past either. On arrival to the ED was found to be severely bradycardic and hypotensive. IVC is full as per ED echo. Patient has K of more than 8 and is taking K and bactrim at home.   PAST MEDICAL HISTORY :  CHF CAD s/p CABG - parkinson  Hyperlipidemia   PAST SURGICAL HISTORY: He  has no past surgical history on file.  No known allergies    FAMILY HISTORY:  His has no family status information on file.   not significant to this admission   SOCIAL HISTORY: He just moved from [place] and lives with his daughter   REVIEW OF SYSTEMS:   Could not be obtained patient is on the vent   Patient transferred from 6M and placed on telemetry.  Patient is alert to self and know he is in the hospital.  Flexiseal and TLC in femoral present as well as an IV in his left shoulder.  Patient is edematous and coughing up thick green sputum.  Suction set up at bedside.  Cellulitis on back of right calf with foam placed over area.  Prophylatic foam present on sacrum.  Will continue to monitor.

## 2016-11-04 NOTE — Consult Note (Signed)
PULMONARY / CRITICAL CARE MEDICINE   Name: Jack Carter MRN: [id number] DOB: Dec 07, 1933    ADMISSION DATE:  10/31/2016 CONSULTATION DATE:  10/31/2016  REFERRING MD:  ED  CHIEF COMPLAINT:  Acute Respiratory Distress  HISTORY OF PRESENT ILLNESS:   83 yr old male with Hx of parkinson disease, CAD s/p CABG, CHF admitted  after as per his daughter him being very weak in the last week. 10/31/16 he was in the bathroom and she all of a sudden heard a thump and him screaming. He told her that he fell and maybe bumped his head but not hard. He did not lose consciousness. He tried to stand up again and he couldn't so she let him down and then shortly he started becoming unconscious.   As per daughter he has cellulitis of his legs and he has been feeling weak in the last week and though he might not be taking his medicines. He just moved from [place] so we dont have much info and she does not know much of his past either. On arrival to the ED was found to be severely bradycardic and hypotensive. IVC is full as per ED echo. Patient has K of more than 8 and is taking K and bactrim at home. He was intubated 8/31 and was deemed ready for extubation 9/2. He was transferred to 5W, where on 9/4 he developed sudden respiratory decompensation. CXR is notable for new bilateral infiltrates consistent with bronchopneumonia. Additionally he is positive 6 L per I&O.  His daughter was notified of change in status and she reversed his code status to full code and stated she wanted him intubated. Rapid response was notified and patient was transferred to 4M and intubated by Dr. Molli Knock.  SUBJECTIVE:  Severe respiratory distress, accessory muscle use, nasal flaring. NTS x 2 without improvement   VITAL SIGNS: BP (!) 166/63 (BP Location: Left Arm)   Pulse (!) 133   Temp 98.6 F (37 C) (Oral)   Resp (!) 36   Ht 5\' 9"  (1.753 m)   Wt 195 lb 5.2 oz (88.6 kg)   SpO2 91%   BMI 28.84 kg/m   HEMODYNAMICS:    VENTILATOR  SETTINGS: FiO2 (%):  [100 %] 100 %  INTAKE / OUTPUT: I/O last 3 completed shifts: In: 3148.8 [I.V.:2348.8; IV Piggyback:800] Out: 1900 [Urine:1600; Stool:300]  PHYSICAL EXAMINATION: General appearance:  83 Year old  Male, in respiratory distress, on 100% NRB Eyes: anicteric sclerae , moist conjunctivae; PERRL, EOMI bilaterally. Mouth:  MM dusky dry,  Neck: Trachea midline; neck supple, no JVD, diaphoretic Lungs/chest: Coarse throughout, RR 34 with saturations in the 89% range on 100% NRB, accessory muscle use and nasal flaring Abdomen: Soft, non-tender; no masses or HSM Extremities: diaphoretic, cool to touch, cap refill> 3 seconds Skin: Warm to touch, clammy, no rash, ulcers or subcutaneous nodules Psych: Confused and working hard to breath  LABS:  BMET  Recent Labs Lab 11/02/16 0316  11/02/16 1745 11/03/16 0400 11/04/16 0447  NA 148*  --   --  149* 148*  K 3.7  < > 3.0* 2.8* 2.8*  CL 115*  --   --  116* 115*  CO2 24  --   --  28 23  BUN 37*  --   --  28* 22*  CREATININE 2.67*  --   --  2.20* 1.78*  GLUCOSE 137*  --   --  99 126*  < > = values in this interval not displayed.  Electrolytes  Recent Labs Lab 11/02/16 0316 11/03/16 0400 11/04/16 0447  CALCIUM 7.4* 7.4* 7.6*  MG  --   --  1.4*   CBC  Recent Labs Lab 11/03/16 0400 11/03/16 0916 11/04/16 0447  WBC 8.1 8.1 7.4  HGB 10.4* 10.7* 10.4*  HCT 32.8* 34.3* 32.8*  PLT 118* 138* 126*   Coag's  Recent Labs Lab 10/31/16 1921  APTT 21*  INR 1.10   Sepsis Markers  Recent Labs Lab 10/31/16 1936 10/31/16 2230  LATICACIDVEN 2.05* 4.11*   ABG  Recent Labs Lab 11/01/16 0225 11/02/16 0318  PHART 7.461* 7.376  PCO2ART 34.3 41.4  PO2ART 402.0* 78.7*   ABG    Component Value Date/Time   PHART 7.376 11/02/2016 0318   PCO2ART 41.4 11/02/2016 0318   PO2ART 78.7 (L) 11/02/2016 0318   HCO3 23.7 11/02/2016 0318   TCO2 26 11/01/2016 0225   ACIDBASEDEF 0.7 11/02/2016 0318   O2SAT 94.8  11/02/2016 0318   Liver Enzymes  Recent Labs Lab 10/31/16 1921  AST 57*  ALT 27  ALKPHOS 195*  BILITOT 1.3*  ALBUMIN 3.2*   Cardiac Enzymes  Recent Labs Lab 10/31/16 1921 11/04/16 0447  TROPONINI 0.13* 4.81*   Glucose  Recent Labs Lab 11/02/16 1119 11/02/16 1551 11/02/16 2027 11/03/16 0017 11/03/16 0423 11/03/16 1918  GLUCAP 88 91 98 105* 87 119*   Imaging Dg Chest Port 1 View  Result Date: 11/04/2016 CLINICAL DATA:  Cough and shortness of breath. EXAM: PORTABLE CHEST 1 VIEW COMPARISON:  10/31/2016 FINDINGS: Endotracheal tube and nasogastric tube have been removed. Previous median sternotomy and CABG. Heart size remains upper limits of normal. Aortic atherosclerosis again noted. Newly seen infiltrates in both lungs, more pronounced in the right upper lobe and in the left lower lobe, most consistent with bronchopneumonia. The may be a small amount of pleural fluid on the left. No acute bone finding. IMPRESSION: Newly seen bilateral infiltrates consistent with bronchopneumonia. Electronically Signed   By: Paulina Fusi M.D.   On: 11/04/2016 07:27   STUDIES:  CT head >>1. No acute intracranial abnormality identified. No calvarial fracture. 2. Mild chronic microvascular ischemic changes and moderate parenchymal volume loss of the brain. 3. Chronic inspissated opacification of right maxillary sinus.  CULTURES: Blood 8/31>>> Urine 8/31>>>No growth Blood 9/4>> Urine Strep 9/4>> Urine Culture 9/4>> Urine Legionella 9/4>> Sputum Culture 9/4>>  ANTIBIOTICS: Zosyn  8/31>>9/3 Unasyn 9/3>>9/4 Vanc 8/31>>8/31 Vanc 9/4>> Zosyn 9/4>> SIGNIFICANT EVENTS: Intubated 8/31>> 9/2 Re-intubated 9/4>>  LINES/TUBES: femoral CVC   DISCUSSION: Patient originally admitted  8/31  with septic and cardiogenic shock secondary to cellulitis and hyperkalemia. Severe hyperkalemia from AKI, use of K and bactrim. Severe bradycardia from hyperkalemia. Acute hypoxemic respiratory failure.  Severe metabolic encephalopathy. Shock resolved, K better/renal fxn better passed SBT. Extubated 9/2.  Transferred to 5W and on the morning of 9/4 patient developed sudden respiratory decompensation after meds were given with applesauce. CXR indicated new bilateral infiltrates. Patient was transferred to 18M and re-intubated. CCM resumed primary management of the patient.  ASSESSMENT / PLAN:  PULMONARY A: acue hypoxemic resp failure requiring mechanical ventilation  CXR personally reviewed by me with new bilateral infiltrates Acute respiratory decompensation, suspected aspiration, No GAG with NTS P:   Intubate now Mag now Phos now ABG in 1 hour VAP precautions SBT in am if able Titrate oxygen for sats> 95% Fentanyl gtt Asp precautions   CARDIOVASCULAR A:  Cardiogenic shock secondary to severe bradycardia from hyperkalemia and septic shock from cellulitis  Known CAD  s/p CABG  Trop elevation to 4.8 on  9/4 12 Lead with suspected demand ischemia ( Reviewed by Garnette ScheuermannHank Smith MD) Pulmonary Htn EF 11/03/2016>> 40-45%>> grade 2 diastolic dysfunction, AV: trivial regurgitation, MV: mild to moderate regurgitation, TV: severe regurg., RA: Dilated,RV: Moderately dilated, PA peak pressure 60 mm Hg  P:  KVO IVF Lasix 40 mg x 1 Then as tolerated per renal; function Tele monitoring Trend Troponins Cards following   RENAL A:   Severe hypokalemia   ->creatinine better, but Na and Cl increased.  P:   Change IVF to 1/2 NS Trend BMET Replete electrolytes Strict I&O Lasix 40 mg x 1 dose   GASTROINTESTINAL A:   Upper GI bleeding ?stress vs traumatic -->favoring traumatic as no bloody output at this point on NG sxn AND hgb stable  P:   Cont PPI bid Tube Feeds   HEMATOLOGIC A:   Mild anemia -->no current evidence of bleeding P:  Trend CBC Resume ASA Heparin SQ as DVT prophylaxis SCD's   INFECTIOUS A:   Cellulitis  ->BCs neg to date HAP P:   Resume Vanc Resume Zosyn D/c  Unasyn Pan Culture  ENDOCRINE A:   No issues  P:  CBG Q4 SSI per protocol Follow glucose   NEUROLOGIC A:   Metabolic encephalopathy  H/o PD P:   Fentanyl for sedation while intubated Cont Home parkinsons medications  PT consult when extubated  FAMILY  - Updates: No family at bedside. Daughter reversed code status and asked for intubation 9/4 am when called to notify of decompensation. Pt. Was in too much distress to specify his desire.  - Inter-disciplinary family meet or Palliative Care meeting due by:  9/7  Bevelyn NgoSarah F. Groce, AGACNP-BC [place] Pulmonary/Critical Care Pager # [phone] if no answer  Attending Note:  83 year old male with extensive PMH who presents with PNA that was intubated then extubated.  Patient transferred to ICU and intubated.  Start broad spectrum abx and pan culture.  On exam, he is clearly not protecting his airway and will continue to be so.  Spoke with the daughter, she changed to full code status.  Fentanyl for sedation if needed.  F/U on cultures.  ISS and CBGs.  Continue parkinson medications.  Resume ASA.  Cardiology consult called.  Restart TF.  Needs GOC discussion with family.  The patient is critically ill with multiple organ systems failure and requires high complexity decision making for assessment and support, frequent evaluation and titration of therapies, application of advanced monitoring technologies and extensive interpretation of multiple databases.   Critical Care Time devoted to patient care services described in this note is  45  Minutes. This time reflects time of care of this signee Dr Koren BoundWesam Yacoub. This critical care time does not reflect procedure time, or teaching time or supervisory time of PA/NP/Med student/Med Resident etc but could involve care discussion time.  Alyson ReedyWesam G. Yacoub, M.D. [facility]/Critical Care Medicine. Pager: [phone]. After hours pager: [phone].  11/04/2016, 10:53 AM

## 2016-11-04 NOTE — Progress Notes (Signed)
[facility]                                                                              Patient Demographics  Jack Carter, is a 83 y.o. male, DOB - 1933-04-19, UEA:[id number]  Admit date - 10/31/2016   Admitting Physician Lupita Leash, MD  Outpatient Primary MD for the patient is No primary care provider on file.  Outpatient specialists:   LOS - 4  days   Medical records reviewed and are as summarized below:    Chief Complaint  Patient presents with  . Loss of Consciousness       Brief summary   Per admitting note by critical care on 8/31 83 yr old WM PMHx Parkinson disease, CAD s/p CABG, CHF, History of Parkinson's ds, presented to ED with weakness for a week. On the day of admission, he was in the bathroom and daughter heard a sudden thump, no loss of consciousness initially. He tried to stand up again and he couldn't so she let him down and then shortly he started becoming unconscious. As per daughter he had cellulitis of his legs and he has been feeling weak in the last week and though he might not be taking his medicines. He just moved from [place] so we dont have much info and she does not know much of his past either. On arrival to the ED was found to be severely bradycardic and hypotensive. IVC is full as per ED echo. Patient has K of more than 8 and is taking K and bactrim at home. He was subsequently intubated for airway protection, poorly responsive on admission, hypotensive and admitted to critical care service. Nephrology has been following. Patient was transferred to [facility] on 9/3 to SDU team, transferred top floor, I assumed care on 9/4   Assessment & Plan    Principal Problem:   Cardiogenic shock (HCC), Septic shock:  Multifactorial with bradycardia, hyperkalemia, septic shock secondary to cellulitis at the time of admission - Patient was intubated for airway protection, was hypotensive on admission, needed vasopressors - BP is currently  improving, alert and awake  Active Problems:   Acute hypoxemic respiratory failure (HCC), bilateral pneumonia -Patient was intubated for airway protection, extubated on 9/2. - Chest x-ray on 9/03 showed bilateral infiltrates, continue IV Unasyn, aspirating. Blood cultures negative to date  - Patient seen twice this am, clearly aspirating, SOB, deep suctioning done by the rapid response with removal of thick yellowish stuff, placed on 100%NRB, RR in high 30s, diffuse rhonchi, increased work of breathing. HR in 130-140's  -Positive balance of 6 L, give Lasix 40 mg IV 1 - called patient's daughter, [name], discussed in detail with her, she rescinded the CODE STATUS, currently full code, she wants intubation if needed. Called CCM, d/w Dr Molli Knock, will transfer to ICU. Patient will not be able to maintain sats on Bipap due to secretions.      Cellulitis - Blood cultures negative to date, continue IV Unasyn    AKI (acute kidney injury) (HCC)With hyperkalemia - Likely due to possible ATN associated with cardiogenic/ septic shock, renal hypoperfusion, meds including Bactrim, Diovan -  Nephrology was consulted.  - Renal ultrasound showed no obstructive uropathy - Patient was given Kayexalate, now potassium low 2.8 today - replace potassium IV, magnesium low 1.4, will replace - Creatinine has been steadily improving  Acute metabolic encephalopathy in the setting of Parkinson's disease -Unclear baseline however patient is able to appropriately answer questions - Continue amantadine, Sinemet, primidone - CT head 8/31 showed moderate diffuse parenchymal volume loss, no infarct or hemorrhage   Dysphagia - SLP evaluation today - Failed swallow evaluation on 9/2. If fails today, will need temporary feeding tube, further conversations with family regarding artificial feeding - needs cortrak tube      CAD (coronary artery disease) of artery bypass graft with elevated troponin - Currently does not have  any chest pain or shortness of breath. Troponin that was ordered (for unclear reasons) showed troponin I of 4.8 - 2d ECHO 9/3:  EF of 40-45% with grade 2 diastolic dysfunction and systolic function right ventricle moderately reduced, PA pressure 60  - cardiology consulted today for further recommendations, ischemia evaluation -Placed on aspirin 81 mg daily    CHF (congestive heart failure) (HCC) Combined systolic and diastolic  - 2-D echo showed EF of 44-5% with grade 2 diastolic dysfunction -  currently stable, on gentle hydration due to NPO status, hold IV fluids due to impending respiratory failure - 6.2 L positive balance, gave Lasix 40 mg IV 1     Parkinson disease (HCC) -Continue amantadine, Sinemet, primidone      Upper GI bleed (?), anemia  - Stress versus traumatic, per CCM, traumatic as no bloody output from NG suction  - Continue PPI IV q12 hrs   Code Status:  Full code  DVT Prophylaxis:  SCD's Family Communication:  no family at bedside, discussed in detail, explained labs, imaging, current situation with patient's daughter, Ms. Deneen Harts on the phone. Overall poor prognosis, needs to have palliative involved.    Disposition Plan:   Time Spent in minutes    Procedures:  Intubation   Consultants:   Admitted by critical care service Cardiology-consulted today  Antimicrobials:    IV Unasyn   Medications  Scheduled Meds: . amantadine  100 mg Oral TID  . carbidopa-levodopa  1 tablet Oral TID  . chlorhexidine  15 mL Mouth Rinse BID  . heparin subcutaneous  5,000 Units Subcutaneous Q8H  . mouth rinse  15 mL Mouth Rinse q12n4p  . pantoprazole  40 mg Intravenous Q12H  . [START ON 11/05/2016] pneumococcal 23 valent vaccine  0.5 mL Intramuscular Tomorrow-1000  . primidone  50 mg Oral Q12H   Continuous Infusions: . sodium chloride    . ampicillin-sulbactam (UNASYN) IV Stopped (11/04/16 0155)  . dextrose 75 mL/hr at 11/04/16 0254  . magnesium sulfate LVP  250-500 ml    . potassium chloride     PRN Meds:.[CANCELED] Place/Maintain arterial line **AND** sodium chloride, atropine, calcium chloride, dextrose, sodium bicarbonate, sodium chloride flush   Antibiotics   Anti-infectives    Start     Dose/Rate Route Frequency Ordered Stop   11/03/16 1300  Ampicillin-Sulbactam (UNASYN) 3 g in sodium chloride 0.9 % 100 mL IVPB     3 g 200 mL/hr over 30 Minutes Intravenous Every 12 hours 11/03/16 1042     11/02/16 2200  vancomycin (VANCOCIN) IVPB 1000 mg/200 mL premix  Status:  Discontinued     1,000 mg 200 mL/hr over 60 Minutes Intravenous Every 48 hours 11/01/16 1032 11/02/16 1036   11/02/16 1400  piperacillin-tazobactam (  ZOSYN) IVPB 3.375 g  Status:  Discontinued     3.375 g 12.5 mL/hr over 240 Minutes Intravenous Every 8 hours 11/02/16 1300 11/03/16 1033   11/01/16 1400  piperacillin-tazobactam (ZOSYN) IVPB 2.25 g  Status:  Discontinued     2.25 g 100 mL/hr over 30 Minutes Intravenous Every 8 hours 11/01/16 1032 11/02/16 1300   11/01/16 0600  piperacillin-tazobactam (ZOSYN) IVPB 3.375 g  Status:  Discontinued     3.375 g 12.5 mL/hr over 240 Minutes Intravenous Every 8 hours 10/31/16 2151 11/01/16 1032   10/31/16 2200  piperacillin-tazobactam (ZOSYN) IVPB 3.375 g     3.375 g 100 mL/hr over 30 Minutes Intravenous  Once 10/31/16 2151 10/31/16 2255   10/31/16 2200  vancomycin (VANCOCIN) 1,500 mg in sodium chloride 0.9 % 500 mL IVPB     1,500 mg 250 mL/hr over 120 Minutes Intravenous  Once 10/31/16 2151 11/01/16 0016        Subjective:   Jack Carter was seen and examined today.  Seen twice this morning. Having difficulty breathing, increased work of breathing, aspirating with diffuse rhonchi. Short of breath. No fevers. Aspirating    Objective:   Vitals:   11/03/16 1800 11/03/16 1919 11/03/16 2015 11/04/16 0500  BP: 137/67  (!) 147/67 135/65  Pulse:   (!) 111 90  Resp: 14  18 18   Temp:  98.3 F (36.8 C) 99.3 F (37.4 C) 98.6 F  (37 C)  TempSrc:    Oral  SpO2: 97%  96% 96%  Weight:    88.6 kg (195 lb 5.2 oz)  Height:        Intake/Output Summary (Last 24 hours) at 11/04/16 4098 Last data filed at 11/04/16 1191  Gross per 24 hour  Intake          1773.75 ml  Output              550 ml  Net          1223.75 ml     Wt Readings from Last 3 Encounters:  11/04/16 88.6 kg (195 lb 5.2 oz)     Exam  General: Alert and oriented x 1, In moderate respiratory distress on 100% NRB   Eyes:   HEENT:  Atraumatic, normocephalic,   Cardiovascular: S1 S2 auscultated,  Tachycardiac  Respiratory:  diffuse rhonchi bilaterally, tachypneic  Gastrointestinal: Soft, nontender, nondistended, + bowel sounds  Ext: no pedal edema bilaterally  Neuro: moving all 4 extremities spontaneously   Musculoskeletal: No digital cyanosis, clubbing  Skin: No rashes  Psych:  in respiratory distress,    Data Reviewed:  I have personally reviewed following labs and imaging studies  Micro Results Recent Results (from the past 240 hour(s))  Culture, blood (routine x 2)     Status: None (Preliminary result)   Collection Time: 10/31/16 10:10 PM  Result Value Ref Range Status   Specimen Description BLOOD LEFT HAND  Final   Special Requests IN PEDIATRIC BOTTLE Blood Culture adequate volume  Final   Culture NO GROWTH 3 DAYS  Final   Report Status PENDING  Incomplete  Culture, blood (routine x 2)     Status: None (Preliminary result)   Collection Time: 10/31/16 10:20 PM  Result Value Ref Range Status   Specimen Description BLOOD RIGHT HAND  Final   Special Requests IN PEDIATRIC BOTTLE Blood Culture adequate volume  Final   Culture NO GROWTH 3 DAYS  Final   Report Status PENDING  Incomplete  Urine culture  Status: None   Collection Time: 11/01/16 12:30 AM  Result Value Ref Range Status   Specimen Description URINE, RANDOM  Final   Special Requests NONE  Final   Culture NO GROWTH  Final   Report Status 11/02/2016 FINAL   Final  MRSA PCR Screening     Status: None   Collection Time: 11/01/16  1:47 AM  Result Value Ref Range Status   MRSA by PCR NEGATIVE NEGATIVE Final    Comment:        The GeneXpert MRSA Assay (FDA approved for NASAL specimens only), is one component of a comprehensive MRSA colonization surveillance program. It is not intended to diagnose MRSA infection nor to guide or monitor treatment for MRSA infections.     Radiology Reports Ct Head Wo Contrast  Result Date: 11/01/2016 CLINICAL DATA:  83 y/o M; unwitnessed fall at home. No noticeable head trauma. EXAM: CT HEAD WITHOUT CONTRAST TECHNIQUE: Contiguous axial images were obtained from the base of the skull through the vertex without intravenous contrast. COMPARISON:  None. FINDINGS: Brain: No evidence of acute infarction, hemorrhage, hydrocephalus, extra-axial collection or mass lesion/mass effect. Few hospice foci of hypoattenuation in subcortical and periventricular white matter are compatible with mild chronic microvascular ischemic changes. Moderate diffuse parenchymal volume loss. Vascular: Calcific atherosclerosis of carotid siphons. No hyperdense vessel identified. Skull: Normal. Negative for fracture or focal lesion. Sinuses/Orbits: Paranasal sinus mucosal thickening and fluid within the nasopharynx is likely due to intubation. Chronic inspissated opacification of the right maxillary sinus. Normal aeration of the mastoid air cells. Debris in external auditory canals, likely cerumen. Other: Choose IMPRESSION: 1. No acute intracranial abnormality identified. No calvarial fracture. 2. Mild chronic microvascular ischemic changes and moderate parenchymal volume loss of the brain. 3. Chronic inspissated opacification of right maxillary sinus. Electronically Signed   By: Mitzi HansenLance  Furusawa-Stratton M.D.   On: 11/01/2016 00:30   [place] Retroperitoneal (renal,aorta,ivc Nodes)  Result Date: 10/31/2016 CLINICAL DATA:  Acute renal injury EXAM: RENAL /  URINARY TRACT ULTRASOUND COMPLETE COMPARISON:  None. FINDINGS: Right Kidney: Length: 10.1 cm. Echogenicity within normal limits. No mass or hydronephrosis visualized. Left Kidney: Length: 10.6 cm. Echogenicity within normal limits. No mass or hydronephrosis visualized. Bladder: Decompressed by Foley catheter. IMPRESSION: Maintained cortical-medullary distinction within both kidneys without obstructive uropathy. Decompressed bladder with indwelling Foley catheter. Electronically Signed   By: Tollie Ethavid  Kwon M.D.   On: 10/31/2016 22:25   Dg Chest Port 1 View  Result Date: 11/04/2016 CLINICAL DATA:  Cough and shortness of breath. EXAM: PORTABLE CHEST 1 VIEW COMPARISON:  10/31/2016 FINDINGS: Endotracheal tube and nasogastric tube have been removed. Previous median sternotomy and CABG. Heart size remains upper limits of normal. Aortic atherosclerosis again noted. Newly seen infiltrates in both lungs, more pronounced in the right upper lobe and in the left lower lobe, most consistent with bronchopneumonia. The may be a small amount of pleural fluid on the left. No acute bone finding. IMPRESSION: Newly seen bilateral infiltrates consistent with bronchopneumonia. Electronically Signed   By: Paulina FusiMark  Shogry M.D.   On: 11/04/2016 07:27   Dg Chest Portable 1 View  Result Date: 10/31/2016 CLINICAL DATA:  Post intubation. EXAM: PORTABLE CHEST 1 VIEW COMPARISON:  None. FINDINGS: An endotracheal tube is identified with tip 2.8 cm above the carina. An NG tube is identified entering the stomach with tip off the field of view. Defibrillator pads overlying the chest are noted. Cardiomegaly identified. No airspace disease, definite pleural effusion or pneumothorax noted. IMPRESSION: Support apparatus as  described. Cardiomegaly without evidence of acute cardiopulmonary disease. Electronically Signed   By: Harmon Pier M.D.   On: 10/31/2016 19:59    Lab Data:  CBC:  Recent Labs Lab 11/02/16 0316 11/02/16 1100 11/02/16 1745  11/03/16 0400 11/03/16 0916 11/04/16 0447  WBC 10.4 10.3 9.4 8.1 8.1 7.4  NEUTROABS 7.9* 7.8* 7.5 6.2 6.3  --   HGB 10.8* 10.5* 10.3* 10.4* 10.7* 10.4*  HCT 34.8* 33.9* 33.7* 32.8* 34.3* 32.8*  MCV 97.8 99.1 98.8 97.9 98.3 96.2  PLT 131* 119* 117* 118* 138* 126*   Basic Metabolic Panel:  Recent Labs Lab 11/01/16 0923 11/01/16 1513  11/02/16 0316 11/02/16 1100 11/02/16 1745 11/03/16 0400 11/04/16 0447  NA 144 147*  --  148*  --   --  149* 148*  K 5.5* 5.2*  < > 3.7 3.4* 3.0* 2.8* 2.8*  CL 113* 113*  --  115*  --   --  116* 115*  CO2 24 24  --  24  --   --  28 23  GLUCOSE 98 126*  --  137*  --   --  99 126*  BUN 47* 44*  --  37*  --   --  28* 22*  CREATININE 3.02* 3.15*  --  2.67*  --   --  2.20* 1.78*  CALCIUM 8.3* 8.2*  --  7.4*  --   --  7.4* 7.6*  MG  --   --   --   --   --   --   --  1.4*  < > = values in this interval not displayed. GFR: Estimated Creatinine Clearance: 35.9 mL/min (A) (by C-G formula based on SCr of 1.78 mg/dL (H)). Liver Function Tests:  Recent Labs Lab 10/31/16 1921  AST 57*  ALT 27  ALKPHOS 195*  BILITOT 1.3*  PROT 7.1  ALBUMIN 3.2*   No results for input(s): LIPASE, AMYLASE in the last 168 hours. No results for input(s): AMMONIA in the last 168 hours. Coagulation Profile:  Recent Labs Lab 10/31/16 1921  INR 1.10   Cardiac Enzymes:  Recent Labs Lab 10/31/16 1921 11/04/16 0447  TROPONINI 0.13* 4.81*   BNP (last 3 results) No results for input(s): PROBNP in the last 8760 hours. HbA1C: No results for input(s): HGBA1C in the last 72 hours. CBG:  Recent Labs Lab 11/02/16 1551 11/02/16 2027 11/03/16 0017 11/03/16 0423 11/03/16 1918  GLUCAP 91 98 105* 87 119*   Lipid Profile: No results for input(s): CHOL, HDL, LDLCALC, TRIG, CHOLHDL, LDLDIRECT in the last 72 hours. Thyroid Function Tests: No results for input(s): TSH, T4TOTAL, FREET4, T3FREE, THYROIDAB in the last 72 hours. Anemia Panel: No results for input(s):  VITAMINB12, FOLATE, FERRITIN, TIBC, IRON, RETICCTPCT in the last 72 hours. Urine analysis:    Component Value Date/Time   COLORURINE AMBER (A) 11/01/2016 0030   APPEARANCEUR HAZY (A) 11/01/2016 0030   LABSPEC 1.019 11/01/2016 0030   PHURINE 5.0 11/01/2016 0030   GLUCOSEU NEGATIVE 11/01/2016 0030   HGBUR SMALL (A) 11/01/2016 0030   BILIRUBINUR NEGATIVE 11/01/2016 0030   KETONESUR NEGATIVE 11/01/2016 0030   PROTEINUR 100 (A) 11/01/2016 0030   NITRITE NEGATIVE 11/01/2016 0030   LEUKOCYTESUR NEGATIVE 11/01/2016 0030     Arrionna Serena M.D. [facility] 11/04/2016, 9:21 AM  Pager: [phone] Between 7am to 7pm - call Pager - [phone]  After 7pm go to [email or web address] - password [email or web address]  Call night coverage person covering after 7pm

## 2016-11-04 NOTE — Progress Notes (Signed)
SLP Cancellation Note  Patient Details Name: Jack Carter MRN: [id number] DOB: 10/25/1933   Cancelled treatment:       Reason Eval/Treat Not Completed: Medical issues which prohibited therapy; pt reintubated.  Our services will sign off/  Please reconsult when warranted.  Thank you.    [name], Melodi Happel Laurice 11/04/2016, 1:53 PM

## 2016-11-04 NOTE — Progress Notes (Signed)
Removed rectal tube per MD order, patient was stable at time.  Within 20 minutes, patient had onset of SOB, O2 sats in the 70's, HR in 150's, face became pale.  Increased O2 and notified rapid response who came to bedside.  Placed nonrebreather on patient and sats increased to high 80's.  MD notified and came to bedside.  Gave 40 mg lasix IV per MD. Jeanene Erballed report to Zella Ballobin, ICU RN and patient transferred.

## 2016-11-04 NOTE — Significant Event (Signed)
Rapid Response Event Note  Overview: Time Called: 0925 Arrival Time: 0929 Event Type: Respiratory  Initial Focused Assessment: Patient with acute respiratory distress Rhonchi through out.  Congested cough.  Very Labored breathing. BP 166/63  HR 133  RR 36  O2 sat 80 on 4L [place]  Placed on NRB  O2 sats 88-89% Patient alert, with garbled speech, per RN baseline. Dr Isidoro Donningai called to bedside.  She stated that when she saw him 20 min ago he was restless wanting flexicel out but not in any distress Arms, Torso and upper legs with 3+ edema Triple lumen femoral line with D5 at 75cc/hr  Interventions: NTS x 2 Thick beige secretions 40 mg Lasix  KVO fluids 12 lead EKG  Dr Isidoro Donningai spoke with daughter, DNR rescinded, pt now full code.  CCM consulted, Kandice RobinsonsSarah Groce NP at bedside Dr Katrinka BlazingSmith at bedside  Transported to 2M03 Urgently Intubated  Plan of Care (if not transferred):  Event Summary: Name of Physician Notified: Dr Isidoro Donningai at 0930  Name of Consulting Physician Notified: Dr Molli KnockYacoub at [phone]  Outcome: Transferred (Comment)  Event End Time: 1100  Marcellina MillinLayton, Brittiny Levitz

## 2016-11-04 NOTE — Procedures (Signed)
Intubation Procedure Note Jack Carter [id number] 1933/12/13  Procedure: Intubation Indications: Airway protection and maintenance  Procedure Details Consent: Risks of procedure as well as the alternatives and risks of each were explained to the (patient/caregiver).  Consent for procedure obtained. Time Out: Verified patient identification, verified procedure, site/side was marked, verified correct patient position, special equipment/implants available, medications/allergies/relevent history reviewed, required imaging and test results available.  Performed  Maximum sterile technique was used including antiseptics, gloves, hand hygiene and mask.  MAC    Evaluation Hemodynamic Status: BP stable throughout; O2 sats: stable throughout Patient's Current Condition: stable Complications: No apparent complications Patient did tolerate procedure well. Chest X-ray ordered to verify placement.  CXR: pending.   Jack Carter 11/04/2016

## 2016-11-04 NOTE — Progress Notes (Signed)
Pharmacy Antibiotic Note  Jack Carter is a 83 y.o. male admitted on 10/31/2016 with pneumonia.  Pharmacy has been consulted for vancomycin/zosyn dosing.  Pt readmitted to ICU from floor for intubation and concern for aspiration pneumonia.    Plan: Vancomycin 1250mg  IV q 24h Zosyn 3.375mg  IV q 8h Monitor improvement of renal fx, LOT, clinical progression, and vanc levels as needed   Height: 5\' 9"  (175.3 cm) Weight: 195 lb 5.2 oz (88.6 kg) IBW/kg (Calculated) : 70.7  Temp (24hrs), Avg:99.3 F (37.4 C), Min:98.3 F (36.8 C), Max:100.2 F (37.9 C)   Recent Labs Lab 10/31/16 1936  10/31/16 2230  11/01/16 0923 11/01/16 1513  11/02/16 0316 11/02/16 1100 11/02/16 1745 11/03/16 0400 11/03/16 0916 11/04/16 0447  WBC  --   < >  --   < > 8.3 12.0*  < > 10.4 10.3 9.4 8.1 8.1 7.4  CREATININE  --   < >  --   < > 3.02* 3.15*  --  2.67*  --   --  2.20*  --  1.78*  LATICACIDVEN 2.05*  --  4.11*  --   --   --   --   --   --   --   --   --   --   < > = values in this interval not displayed.  Estimated Creatinine Clearance: 35.9 mL/min (A) (by C-G formula based on SCr of 1.78 mg/dL (H)).    No Known Allergies  Antimicrobials this admission: Vancomycin 8/31>>9/2 -9/4> Zosyn 8/31>>9/3 -9/4> Unasyn 9/3>>9/4  Dose adjustments this admission: n/a  Microbiology results: 8/31 Blood x 2>> ngtd 9/1 Urine>> No growth  Daylene PoseyJonathan Kiesha Ensey, PharmD Pharmacy Resident Pager #: [phone] 11/04/2016 11:06 AM

## 2016-11-04 NOTE — Procedures (Signed)
NGT Insertion By MD: under direct laryngoscopy, visualized and confirmed with auscultation.  Alyson ReedyWesam G. Yacoub, M.D. [facility]. Pager: [phone]. After hours pager: [phone].

## 2016-11-04 NOTE — Progress Notes (Signed)
Initial Nutrition Assessment  DOCUMENTATION CODES:   Not applicable  INTERVENTION:    Vital AF 1.2 at 70 ml/h (1680 ml per day)  Provides 2016 kcal, 126 gm protein, 1362 ml free water daily  NUTRITION DIAGNOSIS:   Inadequate oral intake related to inability to eat as evidenced by NPO status.  Ongoing  GOAL:   Patient will meet greater than or equal to 90% of their needs  Unmet  MONITOR:   Diet advancement, Vent status, Labs, Weight trends  REASON FOR ASSESSMENT:   Consult Enteral/tube feeding initiation and management  ASSESSMENT:   83 yo male with PMH of CAD, Parkinson's disease, HTN, and HLD who was admitted on 8/31 with bradycardia and hypotension. Intubated from 8/31 to 9/2. Developed sudden respiratory decompensation and required re-intubation on 9/4.  Received MD Consult for TF initiation and management. Patient is currently intubated on ventilator support. MV: 9.9 L/min Temp (24hrs), Avg:99.3 F (37.4 C), Min:98.3 F (36.8 C), Max:100.4 F (38 C)  Propofol: none Labs reviewed: sodium 148 (H), potassium 2.8 (L), magnesium 1.4 (L) CBG's: 166 Medications reviewed and include Lasix.  Diet Order:  Diet NPO time specified  Skin:  Reviewed, no issues  Last BM:  Unknown  Height:   Ht Readings from Last 1 Encounters:  10/31/16 5\' 9"  (1.753 m)    Weight:   Wt Readings from Last 1 Encounters:  11/04/16 195 lb 5.2 oz (88.6 kg)    Ideal Body Weight:  72.73 kg  BMI:  Body mass index is 28.84 kg/m.  Estimated Nutritional Needs:   Kcal:  1965  Protein:  115-130 gm  Fluid:  2 L  EDUCATION NEEDS:   No education needs identified at this time  Joaquin CourtsKimberly Harris, RD, LDN, CNSC Pager [phone] After Hours Pager [phone]

## 2016-11-05 ENCOUNTER — Telehealth: Payer: Self-pay | Admitting: Neurology

## 2016-11-05 ENCOUNTER — Encounter (HOSPITAL_COMMUNITY): Payer: Self-pay | Admitting: *Deleted

## 2016-11-05 ENCOUNTER — Inpatient Hospital Stay (HOSPITAL_COMMUNITY): Payer: Medicare Other

## 2016-11-05 DIAGNOSIS — I25810 Atherosclerosis of coronary artery bypass graft(s) without angina pectoris: Secondary | ICD-10-CM

## 2016-11-05 LAB — BLOOD CULTURE ID PANEL (REFLEXED)
ACINETOBACTER BAUMANNII: NOT DETECTED
CANDIDA ALBICANS: NOT DETECTED
Candida glabrata: NOT DETECTED
Candida krusei: NOT DETECTED
Candida parapsilosis: NOT DETECTED
Candida tropicalis: NOT DETECTED
ENTEROBACTERIACEAE SPECIES: NOT DETECTED
ENTEROCOCCUS SPECIES: NOT DETECTED
Enterobacter cloacae complex: NOT DETECTED
Escherichia coli: NOT DETECTED
HAEMOPHILUS INFLUENZAE: NOT DETECTED
Klebsiella oxytoca: NOT DETECTED
Klebsiella pneumoniae: NOT DETECTED
Listeria monocytogenes: NOT DETECTED
METHICILLIN RESISTANCE: DETECTED — AB
NEISSERIA MENINGITIDIS: NOT DETECTED
PSEUDOMONAS AERUGINOSA: NOT DETECTED
Proteus species: NOT DETECTED
STREPTOCOCCUS PNEUMONIAE: NOT DETECTED
STREPTOCOCCUS PYOGENES: NOT DETECTED
STREPTOCOCCUS SPECIES: NOT DETECTED
Serratia marcescens: NOT DETECTED
Staphylococcus aureus (BCID): NOT DETECTED
Staphylococcus species: DETECTED — AB
Streptococcus agalactiae: NOT DETECTED

## 2016-11-05 LAB — BASIC METABOLIC PANEL
ANION GAP: 7 (ref 5–15)
BUN: 28 mg/dL — AB (ref 6–20)
CHLORIDE: 115 mmol/L — AB (ref 101–111)
CO2: 25 mmol/L (ref 22–32)
Calcium: 7.5 mg/dL — ABNORMAL LOW (ref 8.9–10.3)
Creatinine, Ser: 1.98 mg/dL — ABNORMAL HIGH (ref 0.61–1.24)
GFR calc Af Amer: 35 mL/min — ABNORMAL LOW (ref >60)
GFR calc non Af Amer: 30 mL/min — ABNORMAL LOW (ref >60)
GLUCOSE: 139 mg/dL — AB (ref 65–99)
POTASSIUM: 3.4 mmol/L — AB (ref 3.5–5.1)
Sodium: 147 mmol/L — ABNORMAL HIGH (ref 135–145)

## 2016-11-05 LAB — CULTURE, BLOOD (ROUTINE X 2)
CULTURE: NO GROWTH
Culture: NO GROWTH
SPECIAL REQUESTS: ADEQUATE
Special Requests: ADEQUATE

## 2016-11-05 LAB — GLUCOSE, CAPILLARY
GLUCOSE-CAPILLARY: 102 mg/dL — AB (ref 65–99)
Glucose-Capillary: 132 mg/dL — ABNORMAL HIGH (ref 65–99)
Glucose-Capillary: 121 mg/dL — ABNORMAL HIGH (ref 65–99)
Glucose-Capillary: 92 mg/dL (ref 65–99)
Glucose-Capillary: 136 mg/dL — ABNORMAL HIGH (ref 65–99)
Glucose-Capillary: 123 mg/dL — ABNORMAL HIGH (ref 65–99)

## 2016-11-05 LAB — CBC
HCT: 32.1 % — ABNORMAL LOW (ref 39.0–52.0)
Hemoglobin: 10 g/dL — ABNORMAL LOW (ref 13.0–17.0)
MCH: 30.2 pg (ref 26.0–34.0)
MCHC: 31.2 g/dL (ref 30.0–36.0)
MCV: 97 fL (ref 78.0–100.0)
Platelets: 122 10*3/uL — ABNORMAL LOW (ref 150–400)
RBC: 3.31 MIL/uL — AB (ref 4.22–5.81)
RDW: 16.2 % — ABNORMAL HIGH (ref 11.5–15.5)
WBC: 7.6 10*3/uL (ref 4.0–10.5)

## 2016-11-05 LAB — LEGIONELLA PNEUMOPHILA SEROGP 1 UR AG: L. PNEUMOPHILA SEROGP 1 UR AG: NEGATIVE

## 2016-11-05 LAB — TROPONIN I: Troponin I: 2.01 ng/mL (ref <0.03)

## 2016-11-05 LAB — MAGNESIUM: Magnesium: 2.1 mg/dL (ref 1.7–2.4)

## 2016-11-05 LAB — URINE CULTURE: CULTURE: NO GROWTH

## 2016-11-05 MED ORDER — ASPIRIN 81 MG PO CHEW
81.0000 mg | CHEWABLE_TABLET | Freq: Every day | ORAL | Status: DC
  Administered 2016-11-05: 81 mg
  Filled 2016-11-05: qty 1

## 2016-11-05 MED ORDER — POTASSIUM CHLORIDE 20 MEQ/15ML (10%) PO SOLN
20.0000 meq | Freq: Once | ORAL | Status: AC
  Administered 2016-11-05: 20 meq
  Filled 2016-11-05: qty 15

## 2016-11-05 MED ORDER — ORAL CARE MOUTH RINSE
15.0000 mL | Freq: Two times a day (BID) | OROMUCOSAL | Status: DC
  Administered 2016-11-05 – 2016-11-08 (×6): 15 mL via OROMUCOSAL

## 2016-11-05 MED ORDER — CARBIDOPA-LEVODOPA 25-100 MG PO TABS
1.0000 | ORAL_TABLET | Freq: Three times a day (TID) | ORAL | Status: DC
  Administered 2016-11-05 – 2016-11-18 (×40): 1 via ORAL
  Filled 2016-11-05 (×44): qty 1

## 2016-11-05 MED ORDER — ROPINIROLE HCL 1 MG PO TABS
2.0000 mg | ORAL_TABLET | Freq: Two times a day (BID) | ORAL | Status: DC
  Administered 2016-11-05 – 2016-11-18 (×26): 2 mg via ORAL
  Filled 2016-11-05 (×27): qty 2

## 2016-11-05 MED ORDER — TRAMADOL 5 MG/ML ORAL SUSPENSION: 50.0000 mg | Freq: Three times a day (TID) | ORAL | Status: DC | PRN

## 2016-11-05 MED ORDER — ALBUTEROL SULFATE (2.5 MG/3ML) 0.083% IN NEBU
2.5000 mg | INHALATION_SOLUTION | RESPIRATORY_TRACT | Status: DC | PRN
  Administered 2016-11-07 – 2016-11-12 (×3): 2.5 mg via RESPIRATORY_TRACT
  Filled 2016-11-05 (×3): qty 3

## 2016-11-05 MED ORDER — ATORVASTATIN CALCIUM 10 MG PO TABS
10.0000 mg | ORAL_TABLET | Freq: Every day | ORAL | Status: DC
  Administered 2016-11-05 – 2016-11-17 (×13): 10 mg via ORAL
  Filled 2016-11-05 (×14): qty 1

## 2016-11-05 MED ORDER — PRO-STAT SUGAR FREE PO LIQD
30.0000 mL | Freq: Every day | ORAL | Status: DC
  Administered 2016-11-06 – 2016-11-13 (×8): 30 mL
  Filled 2016-11-05 (×10): qty 30

## 2016-11-05 MED ORDER — AMANTADINE HCL 50 MG/5ML PO SYRP
100.0000 mg | ORAL_SOLUTION | Freq: Three times a day (TID) | ORAL | Status: DC
  Administered 2016-11-05 – 2016-11-18 (×40): 100 mg via ORAL
  Filled 2016-11-05 (×42): qty 10

## 2016-11-05 MED ORDER — GERHARDT'S BUTT CREAM
TOPICAL_CREAM | Freq: Four times a day (QID) | CUTANEOUS | Status: DC
  Administered 2016-11-05 – 2016-11-06 (×4): via TOPICAL
  Administered 2016-11-06 – 2016-11-07 (×2): 1 via TOPICAL
  Administered 2016-11-07: 14:00:00 via TOPICAL
  Administered 2016-11-07: 1 via TOPICAL
  Administered 2016-11-08: 22:00:00 via TOPICAL
  Administered 2016-11-08: 1 via TOPICAL
  Administered 2016-11-09 – 2016-11-10 (×4): via TOPICAL
  Administered 2016-11-11: 1 via TOPICAL
  Administered 2016-11-11 (×3): via TOPICAL
  Administered 2016-11-12: 1 via TOPICAL
  Administered 2016-11-12: 22:00:00 via TOPICAL
  Administered 2016-11-13: 1 via TOPICAL
  Administered 2016-11-13: 22:00:00 via TOPICAL
  Administered 2016-11-13 – 2016-11-14 (×4): 1 via TOPICAL
  Administered 2016-11-14: 22:00:00 via TOPICAL
  Administered 2016-11-14: 1 via TOPICAL
  Administered 2016-11-15 – 2016-11-16 (×6): via TOPICAL
  Administered 2016-11-16: 1 via TOPICAL
  Administered 2016-11-16 – 2016-11-17 (×3): via TOPICAL
  Administered 2016-11-18: 1 via TOPICAL
  Administered 2016-11-18: 14:00:00 via TOPICAL
  Filled 2016-11-05 (×5): qty 1

## 2016-11-05 MED ORDER — ASPIRIN 81 MG PO CHEW
81.0000 mg | CHEWABLE_TABLET | Freq: Every day | ORAL | Status: DC
  Administered 2016-11-06 – 2016-11-18 (×13): 81 mg via ORAL
  Filled 2016-11-05 (×13): qty 1

## 2016-11-05 MED ORDER — FUROSEMIDE 10 MG/ML IJ SOLN
40.0000 mg | Freq: Once | INTRAMUSCULAR | Status: AC
  Administered 2016-11-05: 40 mg via INTRAVENOUS
  Filled 2016-11-05: qty 4

## 2016-11-05 MED ORDER — JEVITY 1.2 CAL PO LIQD
1000.0000 mL | ORAL | Status: DC
  Administered 2016-11-05 – 2016-11-11 (×7): 1000 mL
  Administered 2016-11-12: 12:00:00
  Administered 2016-11-13: 1000 mL
  Filled 2016-11-05 (×17): qty 1000

## 2016-11-05 MED ORDER — TRAMADOL HCL 50 MG PO TABS
50.0000 mg | ORAL_TABLET | Freq: Three times a day (TID) | ORAL | Status: DC | PRN
  Administered 2016-11-05 – 2016-11-07 (×4): 50 mg
  Filled 2016-11-05 (×4): qty 1

## 2016-11-05 MED ORDER — JEVITY 1.2 CAL PO LIQD
1000.0000 mL | ORAL | Status: DC
  Administered 2016-11-05: 1000 mL
  Filled 2016-11-05 (×2): qty 1000

## 2016-11-05 NOTE — Telephone Encounter (Signed)
Received records from prior neurologist.  Not all records received.  First records from 04/2015 and patient already on stalevo and requip.  Noted that patient now in hospital with sepsis.

## 2016-11-05 NOTE — Consult Note (Signed)
PULMONARY / CRITICAL CARE MEDICINE   Name: DERELLE COCKRELL MRN: [id number] DOB: 21-Mar-1933    ADMISSION DATE:  10/31/2016 CONSULTATION DATE:  10/31/2016  REFERRING MD:  ED  CHIEF COMPLAINT:  Acute Respiratory Distress  HISTORY OF PRESENT ILLNESS:   83 yr old male with Hx of parkinson disease, CAD s/p CABG, CHF admitted  after as per his daughter him being very weak in the last week. 10/31/16 he was in the bathroom and she all of a sudden heard a thump and him screaming. He told her that he fell and maybe bumped his head but not hard. He did not lose consciousness. He tried to stand up again and he couldn't so she let him down and then shortly he started becoming unconscious.   As per daughter he has cellulitis of his legs and he has been feeling weak in the last week and though he might not be taking his medicines. He just moved from [place] so we dont have much info and she does not know much of his past either. On arrival to the ED was found to be severely bradycardic and hypotensive. IVC is full as per ED echo. Patient has K of more than 8 and is taking K and bactrim at home. He was intubated 8/31 and was deemed ready for extubation 9/2. He was transferred to 5W, where on 9/4 he developed sudden respiratory decompensation. CXR is notable for new bilateral infiltrates consistent with bronchopneumonia. Additionally he is positive 6 L per I&O.  His daughter was notified of change in status and she reversed his code status to full code and stated she wanted him intubated. Rapid response was notified and patient was transferred to 33M and intubated by Dr. Molli Knock.  SUBJECTIVE:  Tolerating SBT.  VITAL SIGNS: BP 127/66 (BP Location: Right Arm)   Pulse 81   Temp 98.5 F (36.9 C) (Oral)   Resp 20   Ht 5\' 9"  (1.753 m)   Wt 191 lb 5.8 oz (86.8 kg)   SpO2 98%   BMI 28.26 kg/m   VENTILATOR SETTINGS: Vent Mode: PSV;CPAP FiO2 (%):  [40 %-100 %] 40 % Set Rate:  [18 bmp] 18 bmp Vt Set:  [560 mL] 560  mL PEEP:  [5 cmH20-10 cmH20] 5 cmH20 Pressure Support:  [5 cmH20] 5 cmH20 Plateau Pressure:  [16 cmH20-23 cmH20] 18 cmH20  INTAKE / OUTPUT: I/O last 3 completed shifts: In: 2827.2 [I.V.:2347.2; NG/GT:30; IV Piggyback:450] Out: 1780 [Urine:1780]  PHYSICAL EXAMINATION:  General - alert Eyes - pupils reactive ENT - ETT in place Cardiac - regular, no murmur Chest - no wheeze, rales Abd - soft, non tender Ext - no edema Skin - no rashes Neuro - follows commands   LABS:  BMET  Recent Labs Lab 11/04/16 0447 11/04/16 1753 11/05/16 0552  NA 148* 146* 147*  K 2.8* 3.6 3.4*  CL 115* 114* 115*  CO2 23 24 25   BUN 22* 22* 28*  CREATININE 1.78* 1.96* 1.98*  GLUCOSE 126* 125* 139*    Electrolytes  Recent Labs Lab 11/04/16 0447 11/04/16 1753 11/05/16 0552  CALCIUM 7.6* 7.5* 7.5*  MG 1.4* 2.2 2.1  PHOS  --  1.8*  --    CBC  Recent Labs Lab 11/03/16 0916 11/04/16 0447 11/05/16 0552  WBC 8.1 7.4 7.6  HGB 10.7* 10.4* 10.0*  HCT 34.3* 32.8* 32.1*  PLT 138* 126* 122*   Coag's  Recent Labs Lab 10/31/16 1921  APTT 21*  INR 1.10   Sepsis  Markers  Recent Labs Lab 10/31/16 1936 10/31/16 2230  LATICACIDVEN 2.05* 4.11*   ABG  Recent Labs Lab 11/01/16 0225 11/02/16 0318 11/04/16 1410  PHART 7.461* 7.376 7.438  PCO2ART 34.3 41.4 36.1  PO2ART 402.0* 78.7* 139.0*   ABG    Component Value Date/Time   PHART 7.438 11/04/2016 1410   PCO2ART 36.1 11/04/2016 1410   PO2ART 139.0 (H) 11/04/2016 1410   HCO3 24.4 11/04/2016 1410   TCO2 25 11/04/2016 1410   ACIDBASEDEF 0.7 11/02/2016 0318   O2SAT 99.0 11/04/2016 1410   Liver Enzymes  Recent Labs Lab 10/31/16 1921  AST 57*  ALT 27  ALKPHOS 195*  BILITOT 1.3*  ALBUMIN 3.2*   Cardiac Enzymes  Recent Labs Lab 11/04/16 0447 11/04/16 1329 11/04/16 1753  TROPONINI 4.81* 3.33* 3.61*   Glucose  Recent Labs Lab 11/04/16 1103 11/04/16 1559 11/04/16 2024 11/04/16 2345 11/05/16 0418  11/05/16 0743  GLUCAP 166* 127* 130* 154* 132* 102*   Imaging Portable Chest Xray  Result Date: 11/05/2016 CLINICAL DATA:  Intubated. EXAM: PORTABLE CHEST 1 VIEW COMPARISON:  Yesterday. FINDINGS: Endotracheal tube in satisfactory position. Nasogastric tube extending into the stomach. Stable enlarged cardiac silhouette and post CABG changes. No significant change in diffuse prominence of the interstitial markings. Slightly more patchy in upper lung zones is improved. Increased patchy and linear density at the right lung base and interval mild ill-defined increased density at the right lung base. Diffuse osteopenia. IMPRESSION: 1. Increased left basilar atelectasis or pneumonia. 2. Interval mild right basilar atelectasis. 3. Possible small bilateral pleural effusions. 4. No significant change in interstitial lung disease. This could represent a combination of chronic interstitial lung disease and interstitial edema. 5. Mildly improved somewhat more patchy opacity in both upper lung zones, possibly representing pneumonia or interstitial pneumonitis. Electronically Signed   By: Beckie Salts M.D.   On: 11/05/2016 07:17   Portable Chest Xray  Result Date: 11/04/2016 CLINICAL DATA:  Septic shock. Acute hypoxemic respiratory failure. Intubation. EXAM: PORTABLE CHEST 1 VIEW COMPARISON:  11/04/2016 FINDINGS: New endotracheal tube and nasogastric tube are seen in appropriate position. Bilateral perihilar predominant airspace disease shows no significant change, consistent with pulmonary edema. Heart size remains stable. Prior CABG again noted. No pneumothorax visualized. IMPRESSION: New endotracheal tube and nasogastric tube in appropriate position. Stable pulmonary edema. Electronically Signed   By: Myles Rosenthal M.D.   On: 11/04/2016 11:23   STUDIES:  CT head 9/01 >> chronic microvascular ischemic changes, Rt maxillary sinus opacification Echo 9/03 >> EF 40 to 45%, grade 2 DD, mild/mod MR, severe TR, PAS 60  mmHg  CULTURES: Blood 8/31 >> negative Urine 9/01 >> negative Blood 9/4>> GPC in clusters Urine Strep 9/4>> Negative Urine Culture 9/4>> negative Urine Legionella 9/4>> Sputum Culture 9/4>>  ANTIBIOTICS: Zosyn  8/31>>9/3 Unasyn 9/3>>9/4 Vanc 8/31>>8/31 Vanc 9/4>> Zosyn 9/4>>  SIGNIFICANT EVENTS: Intubated 8/31>> 9/2 Re-intubated 9/4>>  LINES/TUBES: femoral CVC   DISCUSSION: Patient originally admitted  8/31  with septic and cardiogenic shock secondary to cellulitis and hyperkalemia. Severe hyperkalemia from AKI, use of K and bactrim. Severe bradycardia from hyperkalemia. Acute hypoxemic respiratory failure. Severe metabolic encephalopathy. Shock resolved, K better/renal fxn better passed SBT. Extubated 9/2.  Transferred to 5W and on the morning of 9/4 patient developed sudden respiratory decompensation after meds were given with applesauce. CXR indicated new bilateral infiltrates. Patient was transferred to 24M and re-intubated. CCM resumed primary management of the patient.  ASSESSMENT / PLAN:  PULMONARY A: Acute hypoxic respiratory failure  from aspiration and pulmonary edema. P:   Extubate 9/05 F/u CXR O2 to keep SpO2 > 92%  CARDIOVASCULAR A: NSTEMI. Hx of CAD, valvular heart disease. Acute on chronic combined CHF. WHO group 2 pulmonary HTN. P:  Lasix 40 mg IV x one today Cardiology following Continue ASA, lipitor  RENAL A:   Hyperkalemia on admission >> resolved. Hypokalemia. Acute renal failure from ATN >> improved. P:   Monitor renal fx, urine outpt  GASTROINTESTINAL A:   Dysphagia. P:   F/u with speech therapy Cortrak after extubation  HEMATOLOGIC A:   Anemia of critical illness and chronic disease. P:  F/u CBC  INFECTIOUS A:   Cellulitis. Aspiration pneumonia. P:   Continue Abx  ENDOCRINE A:   Hyperglycemia. P:  SSI  NEUROLOGIC A:   Acute metabolic encephalopathy. Hx of Parkinson disease, restless leg syndrome. P:    Continue amantadine, sinemet Hold outpt neurontin, requip for now  Updated pt's daughter over the phone.  CC time 32 minutes  Coralyn HellingVineet Demetre Monaco, MD [facility] 11/05/2016, 10:39 AM Pager:  [phone] After 3pm call: [phone]

## 2016-11-05 NOTE — Procedures (Signed)
Extubation Procedure Note  Patient Details:   Name: Jack Carter DOB: 01/23/1934 MRN: [id number]   Airway Documentation:     Evaluation  O2 sats: stable throughout Complications: No apparent complications Patient did tolerate procedure well. Bilateral Breath Sounds: Clear, Diminished   Yes  Placed on 3l/min [place]  Newt LukesGroendal, Aleyah Balik Ann 11/05/2016, 11:02 AM

## 2016-11-05 NOTE — Progress Notes (Signed)
eLink Physician-Brief Progress Note Patient Name: Jack Carter DOB: 02/26/1934 MRN: [id number]   Date of Service  11/05/2016  HPI/Events of Note  Multiple issues: 1. Patient c/o leg pain and 2. K+ = 3.4 and Creatinine = 1.98.  eICU Interventions  Will order: 1. Restart home Requip 2 mg PO Q 12 hours. 2. Restart home Tramadol 50 mg PO Q 8 hours PRN pain.  3. Replace K+.      Intervention Category Intermediate Interventions: Pain - evaluation and management  Sommer,Steven Eugene 11/05/2016, 9:01 PM

## 2016-11-05 NOTE — Care Management Note (Addendum)
Case Management Note  Patient Details  Name: Jack Carter MRN: [id number] Date of Birth: 05/11/1933  Subjective/Objective:  Pt admitted post fall and losing consciousness - transferred from floor in resp distress and now intubated on ICU unit                 Action/Plan:  PTA from home with daughter - [place]  Pt will benefit from PT eval post extubation.  CM will continue to follow for discharge needs   Expected Discharge Date:                  Expected Discharge Plan:     In-House Referral:     Discharge planning Services  CM Consult  Post Acute Care Choice:    Choice offered to:     DME Arranged:    DME Agency:     HH Arranged:    HH Agency:     Status of Service:     If discussed at [employer] Meetings, dates discussed:    Additional Comments:  Cherylann ParrClaxton, Malary Aylesworth S, RN 11/05/2016, 11:26 AM

## 2016-11-05 NOTE — Progress Notes (Signed)
200cc of fentanyl drip wasted in sink. Witness by Harriett Sineerrance RN

## 2016-11-05 NOTE — Progress Notes (Signed)
Nutrition Consult / Follow-up  DOCUMENTATION CODES:   Not applicable  INTERVENTION:   Jevity 12. at 70 ml/h (1680 ml per day) Pro-stat 30 ml once daily Provides 2116 kcal, 108 gm protein, 1361 ml free water daily  NUTRITION DIAGNOSIS:   Inadequate oral intake related to inability to eat as evidenced by NPO status.  Ongoing  GOAL:   Patient will meet greater than or equal to 90% of their needs  Unmet  MONITOR:   TF tolerance, Labs, I & O's, Diet advancement  REASON FOR ASSESSMENT:   Consult Enteral/tube feeding initiation and management  ASSESSMENT:   83 yo male with PMH of CAD, Parkinson's disease, HTN, and HLD who was admitted on 8/31 with bradycardia and hypotension. Intubated from 8/31 to 9/2. Developed sudden respiratory decompensation and required re-intubation on 9/4.  Discussed patient in ICU rounds and with RN today. Patient was extubated this morning. Remains NPO. Cortrak was placed today, tip at the ligament of Treitz. Received MD Consult for TF initiation and management. Labs reviewed: sodium 147 (H), potassium 3.4 (L) CBG's: 102-121-92 Medications reviewed.  Nutrition-Focused physical exam completed. Findings are no fat depletion, no muscle depletion, and moderate edema.   Diet Order:  Diet NPO time specified  Skin:  Reviewed, no issues  Last BM:  9/3  Height:   Ht Readings from Last 1 Encounters:  10/31/16 5\' 9"  (1.753 m)    Weight:   Wt Readings from Last 1 Encounters:  11/05/16 191 lb 5.8 oz (86.8 kg)    Ideal Body Weight:  72.73 kg  BMI:  Body mass index is 28.26 kg/m.  Estimated Nutritional Needs:   Kcal:  1950-2150  Protein:  100-110 gm  Fluid:  2 L  EDUCATION NEEDS:   No education needs identified at this time  Joaquin CourtsKimberly Jacqueleen Pulver, RD, LDN, CNSC Pager [phone] After Hours Pager [phone]

## 2016-11-05 NOTE — Progress Notes (Addendum)
Progress Note  Patient Name: Jack Carter Date of Encounter: 11/05/2016  Primary Cardiologist: New  Subjective   Extubated earlier.  Inpatient Medications    Scheduled Meds: . amantadine  100 mg Oral TID  . [START ON 11/06/2016] aspirin  81 mg Oral Daily  . atorvastatin  10 mg Oral QHS  . carbidopa-levodopa  1 tablet Oral TID  . chlorhexidine gluconate (MEDLINE KIT)  15 mL Mouth Rinse BID  . feeding supplement (PRO-STAT SUGAR FREE 64)  30 mL Per Tube Daily  . Gerhardt's butt cream   Topical QID  . heparin subcutaneous  5,000 Units Subcutaneous Q8H  . insulin aspart  0-9 Units Subcutaneous Q4H  . mouth rinse  15 mL Mouth Rinse QID  . pantoprazole  40 mg Intravenous Q12H  . pneumococcal 23 valent vaccine  0.5 mL Intramuscular Tomorrow-1000  . primidone  50 mg Oral Q12H   Continuous Infusions: . feeding supplement (JEVITY 1.2 CAL) 1,000 mL (11/05/16 1806)  . piperacillin-tazobactam (ZOSYN)  IV Stopped (11/05/16 1808)  . vancomycin Stopped (11/05/16 1532)   PRN Meds: albuterol, bisacodyl, sodium chloride flush   Vital Signs    Vitals:   11/05/16 1600 11/05/16 1605 11/05/16 1700 11/05/16 1800  BP: 129/70  129/68 101/60  Pulse: (!) 102  100 97  Resp: 18  (!) 32 17  Temp:  100.2 F (37.9 C)    TempSrc:  Oral    SpO2: 97%  95% 98%  Weight:      Height:        Intake/Output Summary (Last 24 hours) at 11/05/16 1919 Last data filed at 11/05/16 1800  Gross per 24 hour  Intake           772.36 ml  Output             2120 ml  Net         -1347.64 ml   Filed Weights   11/03/16 0408 11/04/16 0500 11/05/16 0500  Weight: 189 lb 13.1 oz (86.1 kg) 195 lb 5.2 oz (88.6 kg) 191 lb 5.8 oz (86.8 kg)    Telemetry    Sinus rhythm - Personally Reviewed  ECG    No new tracing is available. - Personally Reviewed  Physical Exam  Sedated and intubated GEN: No acute distress.   Neck: No JVD Cardiac: RRR, no murmurs, rubs, or gallops.  Respiratory: Clear to  auscultation bilaterally. Mechanical support noses are audible GI: Soft, nontender, non-distended  MS: No edema; No deformity. Neuro:  Nonfocal  Psych: Normal affect   Labs    Chemistry Recent Labs Lab 10/31/16 1921  11/04/16 0447 11/04/16 1753 11/05/16 0552  NA 134*  < > 148* 146* 147*  K >7.5*  < > 2.8* 3.6 3.4*  CL 107  < > 115* 114* 115*  CO2 20*  < > 23 24 25   GLUCOSE 160*  < > 126* 125* 139*  BUN 52*  < > 22* 22* 28*  CREATININE 2.99*  < > 1.78* 1.96* 1.98*  CALCIUM 8.5*  < > 7.6* 7.5* 7.5*  PROT 7.1  --   --   --   --   ALBUMIN 3.2*  --   --   --   --   AST 57*  --   --   --   --   ALT 27  --   --   --   --   ALKPHOS 195*  --   --   --   --  BILITOT 1.3*  --   --   --   --   GFRNONAA 18*  < > 34* 30* 30*  GFRAA 21*  < > 39* 35* 35*  ANIONGAP 7  < > 10 8 7   < > = values in this interval not displayed.   Hematology Recent Labs Lab 11/03/16 0916 11/04/16 0447 11/05/16 0552  WBC 8.1 7.4 7.6  RBC 3.49* 3.41* 3.31*  HGB 10.7* 10.4* 10.0*  HCT 34.3* 32.8* 32.1*  MCV 98.3 96.2 97.0  MCH 30.7 30.5 30.2  MCHC 31.2 31.7 31.2  RDW 16.8* 16.0* 16.2*  PLT 138* 126* 122*    Cardiac Enzymes Recent Labs Lab 11/04/16 0447 11/04/16 1329 11/04/16 1753 11/05/16 1107  TROPONINI 4.81* 3.33* 3.61* 2.01*    Recent Labs Lab 10/31/16 1952  TROPIPOC 0.11*     BNP Recent Labs Lab 10/31/16 2202  BNP 205.9*     DDimer No results for input(s): DDIMER in the last 168 hours.   Radiology    Portable Chest Xray  Result Date: 11/05/2016 CLINICAL DATA:  Intubated. EXAM: PORTABLE CHEST 1 VIEW COMPARISON:  Yesterday. FINDINGS: Endotracheal tube in satisfactory position. Nasogastric tube extending into the stomach. Stable enlarged cardiac silhouette and post CABG changes. No significant change in diffuse prominence of the interstitial markings. Slightly more patchy in upper lung zones is improved. Increased patchy and linear density at the right lung base and interval  mild ill-defined increased density at the right lung base. Diffuse osteopenia. IMPRESSION: 1. Increased left basilar atelectasis or pneumonia. 2. Interval mild right basilar atelectasis. 3. Possible small bilateral pleural effusions. 4. No significant change in interstitial lung disease. This could represent a combination of chronic interstitial lung disease and interstitial edema. 5. Mildly improved somewhat more patchy opacity in both upper lung zones, possibly representing pneumonia or interstitial pneumonitis. Electronically Signed   By: Claudie Revering M.D.   On: 11/05/2016 07:17   Portable Chest Xray  Result Date: 11/04/2016 CLINICAL DATA:  Septic shock. Acute hypoxemic respiratory failure. Intubation. EXAM: PORTABLE CHEST 1 VIEW COMPARISON:  11/04/2016 FINDINGS: New endotracheal tube and nasogastric tube are seen in appropriate position. Bilateral perihilar predominant airspace disease shows no significant change, consistent with pulmonary edema. Heart size remains stable. Prior CABG again noted. No pneumothorax visualized. IMPRESSION: New endotracheal tube and nasogastric tube in appropriate position. Stable pulmonary edema. Electronically Signed   By: Earle Gell M.D.   On: 11/04/2016 11:23   Dg Chest Port 1 View  Result Date: 11/04/2016 CLINICAL DATA:  Cough and shortness of breath. EXAM: PORTABLE CHEST 1 VIEW COMPARISON:  10/31/2016 FINDINGS: Endotracheal tube and nasogastric tube have been removed. Previous median sternotomy and CABG. Heart size remains upper limits of normal. Aortic atherosclerosis again noted. Newly seen infiltrates in both lungs, more pronounced in the right upper lobe and in the left lower lobe, most consistent with bronchopneumonia. The may be a small amount of pleural fluid on the left. No acute bone finding. IMPRESSION: Newly seen bilateral infiltrates consistent with bronchopneumonia. Electronically Signed   By: Nelson Chimes M.D.   On: 11/04/2016 07:27    Cardiac Studies    2-D Doppler echocardiogram 11/03/2016: Study Conclusions  - Left ventricle: The cavity size was normal. Systolic function was   mildly to moderately reduced. The estimated ejection fraction was   in the range of 40% to 45%. Features are consistent with a   pseudonormal left ventricular filling pattern, with concomitant   abnormal relaxation and increased filling  pressure (grade 2   diastolic dysfunction). - Ventricular septum: The contour showed diastolic flattening and   systolic flattening. These changes are consistent with RV volume   and pressure overload. - Aortic valve: There was trivial regurgitation. - Mitral valve: There was mild to moderate regurgitation directed   centrally. - Left atrium: The atrium was moderately dilated. - Right ventricle: The cavity size was moderately dilated. Wall   thickness was normal. Systolic function was moderately reduced. - Right atrium: The atrium was moderately dilated. - Atrial septum: No defect or patent foramen ovale was identified. - Tricuspid valve: There was malcoaptation of the valve leaflets.   There was severe regurgitation directed centrally. - Pulmonary arteries: Systolic pressure was moderately increased.   PA peak pressure: 60 mm Hg (S).   Patient Profile     83 y.o. male with a hx of prior CABG and elevated cardiac markers who is being seen today for the evaluation of elevated troponin at the request of Dr. Alfonso Patten. [facility] of [facility]. Suddenly developed respiratory distress and required intubation with transfer to ICU.   Assessment & Plan    1. Elevated troponin likely related to supply demand mismatch rather than plaque rupture and acute vessel thrombosis. Repeat EKG today to rule out evolutionary EKG changes. 2. Coronary artery disease with prior coronary bypass grafting. Details are unavailable 3. Acute on chronic respiratory failure, likely multifactorial. Diuresis as tolerated by blood pressure and kidney function  may be helpful.    Signed, Sinclair Grooms, MD  11/05/2016, 7:19 PM

## 2016-11-05 NOTE — Progress Notes (Signed)
Cortrak Tube Team Note:  Consult received to place a Cortrak feeding tube.   A 10 F Cortrak tube was placed in the R nare and secured with a nasal bridle at 85 cm. Per the Cortrak monitor reading the tube tip is post pyloric.   No x-ray is required. RN may begin using tube.   If the tube becomes dislodged please keep the tube and contact the Cortrak team at [email or web address] (password [email or web address]) for replacement.  If after hours and replacement cannot be delayed, place a NG tube and confirm placement with an abdominal x-ray.    Kendell BaneHeather Texie Tupou RD, LDN, CNSC [phone] Pager [phone] After Hours Pager

## 2016-11-05 NOTE — Progress Notes (Signed)
PHARMACY - PHYSICIAN COMMUNICATION CRITICAL VALUE ALERT - BLOOD CULTURE IDENTIFICATION (BCID)  Results for orders placed or performed during the hospital encounter of 10/31/16  Blood Culture ID Panel (Reflexed) (Collected: 11/04/2016  1:36 PM)  Result Value Ref Range   Enterococcus species NOT DETECTED NOT DETECTED   Listeria monocytogenes NOT DETECTED NOT DETECTED   Staphylococcus species DETECTED (A) NOT DETECTED   Staphylococcus aureus NOT DETECTED NOT DETECTED   Methicillin resistance DETECTED (A) NOT DETECTED   Streptococcus species NOT DETECTED NOT DETECTED   Streptococcus agalactiae NOT DETECTED NOT DETECTED   Streptococcus pneumoniae NOT DETECTED NOT DETECTED   Streptococcus pyogenes NOT DETECTED NOT DETECTED   Acinetobacter baumannii NOT DETECTED NOT DETECTED   Enterobacteriaceae species NOT DETECTED NOT DETECTED   Enterobacter cloacae complex NOT DETECTED NOT DETECTED   Escherichia coli NOT DETECTED NOT DETECTED   Klebsiella oxytoca NOT DETECTED NOT DETECTED   Klebsiella pneumoniae NOT DETECTED NOT DETECTED   Proteus species NOT DETECTED NOT DETECTED   Serratia marcescens NOT DETECTED NOT DETECTED   Haemophilus influenzae NOT DETECTED NOT DETECTED   Neisseria meningitidis NOT DETECTED NOT DETECTED   Pseudomonas aeruginosa NOT DETECTED NOT DETECTED   Candida albicans NOT DETECTED NOT DETECTED   Candida glabrata NOT DETECTED NOT DETECTED   Candida krusei NOT DETECTED NOT DETECTED   Candida parapsilosis NOT DETECTED NOT DETECTED   Candida tropicalis NOT DETECTED NOT DETECTED    Name of physician (or Provider) Contacted: BCID  Changes to prescribed antibiotics required: Could be contaminant; however for now remains on Zosyn and vancomycin for coverage of PNA.    Sheron NightingaleJames A Kais Monje 11/05/2016  10:30 AM

## 2016-11-06 ENCOUNTER — Inpatient Hospital Stay (HOSPITAL_COMMUNITY): Payer: Medicare Other

## 2016-11-06 DIAGNOSIS — N179 Acute kidney failure, unspecified: Secondary | ICD-10-CM

## 2016-11-06 LAB — GLUCOSE, CAPILLARY
GLUCOSE-CAPILLARY: 108 mg/dL — AB (ref 65–99)
Glucose-Capillary: 136 mg/dL — ABNORMAL HIGH (ref 65–99)
Glucose-Capillary: 137 mg/dL — ABNORMAL HIGH (ref 65–99)
Glucose-Capillary: 150 mg/dL — ABNORMAL HIGH (ref 65–99)
Glucose-Capillary: 86 mg/dL (ref 65–99)

## 2016-11-06 LAB — TROPONIN I: Troponin I: 1.29 ng/mL (ref <0.03)

## 2016-11-06 LAB — CULTURE, BLOOD (ROUTINE X 2): Special Requests: ADEQUATE

## 2016-11-06 LAB — CULTURE, RESPIRATORY

## 2016-11-06 LAB — BASIC METABOLIC PANEL
Anion gap: 12 (ref 5–15)
BUN: 27 mg/dL — ABNORMAL HIGH (ref 6–20)
CALCIUM: 8 mg/dL — AB (ref 8.9–10.3)
CO2: 23 mmol/L (ref 22–32)
CREATININE: 1.84 mg/dL — AB (ref 0.61–1.24)
Chloride: 114 mmol/L — ABNORMAL HIGH (ref 101–111)
GFR calc non Af Amer: 33 mL/min — ABNORMAL LOW (ref >60)
GFR, EST AFRICAN AMERICAN: 38 mL/min — AB (ref >60)
GLUCOSE: 119 mg/dL — AB (ref 65–99)
Potassium: 3.3 mmol/L — ABNORMAL LOW (ref 3.5–5.1)
Sodium: 149 mmol/L — ABNORMAL HIGH (ref 135–145)

## 2016-11-06 LAB — MAGNESIUM: MAGNESIUM: 1.8 mg/dL (ref 1.7–2.4)

## 2016-11-06 LAB — CBC
HCT: 33.6 % — ABNORMAL LOW (ref 39.0–52.0)
Hemoglobin: 10.7 g/dL — ABNORMAL LOW (ref 13.0–17.0)
MCH: 30.7 pg (ref 26.0–34.0)
MCHC: 31.8 g/dL (ref 30.0–36.0)
MCV: 96.3 fL (ref 78.0–100.0)
Platelets: 120 10*3/uL — ABNORMAL LOW (ref 150–400)
RBC: 3.49 MIL/uL — ABNORMAL LOW (ref 4.22–5.81)
RDW: 16.4 % — ABNORMAL HIGH (ref 11.5–15.5)
WBC: 11.2 10*3/uL — ABNORMAL HIGH (ref 4.0–10.5)

## 2016-11-06 LAB — CULTURE, RESPIRATORY W GRAM STAIN

## 2016-11-06 LAB — BRAIN NATRIURETIC PEPTIDE: B Natriuretic Peptide: 813 pg/mL — ABNORMAL HIGH (ref 0.0–100.0)

## 2016-11-06 MED ORDER — FUROSEMIDE 10 MG/ML PO SOLN
40.0000 mg | Freq: Every day | ORAL | Status: DC
  Administered 2016-11-06: 40 mg
  Filled 2016-11-06: qty 5
  Filled 2016-11-06: qty 4

## 2016-11-06 MED ORDER — PANTOPRAZOLE SODIUM 40 MG PO PACK
40.0000 mg | PACK | Freq: Every day | ORAL | Status: DC
  Administered 2016-11-06 – 2016-11-18 (×13): 40 mg
  Filled 2016-11-06 (×13): qty 20

## 2016-11-06 MED ORDER — FENTANYL CITRATE (PF) 100 MCG/2ML IJ SOLN
50.0000 ug | Freq: Once | INTRAMUSCULAR | Status: AC
  Administered 2016-11-06: 50 ug via INTRAVENOUS

## 2016-11-06 MED ORDER — FENTANYL CITRATE (PF) 100 MCG/2ML IJ SOLN
INTRAMUSCULAR | Status: AC
  Administered 2016-11-06: 50 ug via INTRAVENOUS
  Filled 2016-11-06: qty 2

## 2016-11-06 MED ORDER — POTASSIUM CHLORIDE 20 MEQ/15ML (10%) PO SOLN
40.0000 meq | Freq: Once | ORAL | Status: AC
  Administered 2016-11-06: 40 meq
  Filled 2016-11-06: qty 30

## 2016-11-06 NOTE — Progress Notes (Signed)
PULMONARY / CRITICAL CARE MEDICINE   Name: Jack Carter MRN: [id number] DOB: 08-10-1933    ADMISSION DATE:  10/31/2016 CONSULTATION DATE:  10/31/2016  REFERRING MD:  ED  CHIEF COMPLAINT:  Acute Respiratory Distress  HISTORY OF PRESENT ILLNESS:   83 yo male admitted with weakness.  Was being tx for cellulitis.  Found to have bradycardia and hypotension, hyperkalemia.  Required intubation for airway, but successfully extubated and transferred out of ICU.  Returned to ICU 9/04 with respiratory failure likely from aspiration and acute pulmonary edema.  PMHx of Parkinson disease, CAD s/p CABG, CHF.  SUBJECTIVE:  Being seen by speech therapy.  C/o leg pains.  VITAL SIGNS: BP (!) 143/81   Pulse (!) 106   Temp 98.9 F (37.2 C) (Oral)   Resp (!) 21   Ht  (1.753 m)   Wt 180 lb 12.4 oz (82 kg)   SpO2 97%   BMI 26.70 kg/m   INTAKE / OUTPUT: I/O last 3 completed shifts: In: 1730.4 [I.V.:124.4; NG/GT:1106; IV Piggyback:500] Out: 3395 [Urine:3395]  PHYSICAL EXAMINATION:  General - alert Eyes - pupils reactive ENT - no sinus tenderness, no oral exudate, no LAN, panda in place Cardiac - regular, no murmur Chest - no wheeze, rales Abd - soft, non tender Ext - no edema Skin - no rashes Neuro - follows commands Psych - intermittently agitated   LABS:  BMET  Recent Labs Lab 11/04/16 1753 11/05/16 0552 11/06/16 0729  NA 146* 147* 149*  K 3.6 3.4* 3.3*  CL 114* 115* 114*  CO2 BUN 22* 28* 27*  CREATININE 1.96* 1.98* 1.84*  GLUCOSE 125* 139* 119*    Electrolytes  Recent Labs Lab 11/04/16 1753 11/05/16 0552 11/06/16 0729  CALCIUM 7.5* 7.5* 8.0*  MG 2.2 2.1 1.8  PHOS 1.8*  --   --    CBC  Recent Labs Lab 11/04/16 0447 11/05/16 0552 11/06/16 0729  WBC 7.4 7.6 11.2*  HGB 10.4* 10.0* 10.7*  HCT 32.8* 32.1* 33.6*  PLT 126* 122* 120*   Coag's  Recent Labs Lab 10/31/16 1921  APTT 21*  INR 1.10   Sepsis Markers  Recent Labs Lab  10/31/16 1936 10/31/16 2230  LATICACIDVEN 2.05* 4.11*   ABG  Recent Labs Lab 11/01/16 0225 11/02/16 0318 11/04/16 1410  PHART 7.461* 7.376 7.438  PCO2ART 34.3 41.4 36.1  PO2ART 402.0* 78.7* 139.0*   ABG    Component Value Date/Time   PHART 7.438 11/04/2016 1410   PCO2ART 36.1 11/04/2016 1410   PO2ART 139.0 (H) 11/04/2016 1410   HCO3 24.4 11/04/2016 1410   TCO2 25 11/04/2016 1410   ACIDBASEDEF 0.7 11/02/2016 0318   O2SAT 99.0 11/04/2016 1410   Liver Enzymes  Recent Labs Lab 10/31/16 1921  AST 57*  ALT 27  ALKPHOS 195*  BILITOT 1.3*  ALBUMIN 3.2*   Cardiac Enzymes  Recent Labs Lab 11/04/16 1753 11/05/16 1107 11/06/16 0729  TROPONINI 3.61* 2.01* 1.29*   Glucose  Recent Labs Lab 11/05/16 1213 11/05/16 1604 11/05/16 1939 11/05/16 2308 11/06/16 0343 11/06/16 0732  GLUCAP 121* 92 136* 123* 150* 108*   Imaging Dg Chest Port 1 View  Result Date: 11/06/2016 CLINICAL DATA:  Respiratory failure, previous CABG. EXAM: PORTABLE CHEST 1 VIEW COMPARISON:  Portable chest x-ray of November 05, 2016 FINDINGS: The trachea has been extubated. The lungs remain adequately inflated. There is increased interstitial density throughout the right lung and in the left perihilar region more conspicuous today. The retrocardiac  region on the left remains dense. There is no pneumothorax nor large pleural effusion. The patient has undergone previous CABG. The cardiac silhouette is mildly enlarged. The pulmonary vascularity is engorged and indistinct. There is calcification in the wall of the aortic arch. The sternal wires are intact. The feeding tube tip projects below the inferior margin of the image. IMPRESSION: Interval increase in interstitial density throughout the right lung and in the left perihilar region most compatible with CHF. Interstitial pneumonia is felt less likely. Persistent left basilar atelectasis or pneumonia. Thoracic aortic atherosclerosis. Electronically Signed   By:  David  [place]azil[place] M.D.   On: 11/06/2016 07:14   STUDIES:  CT head 9/01 >> chronic microvascular ischemic changes, Rt maxillary sinus opacification Echo 9/03 >> EF 40 to 45%, grade 2 DD, mild/mod MR, severe TR, PAS 60 mmHg  CULTURES: Blood 8/31 >> negative Urine 9/01 >> negative Blood 9/4>> GPC in clusters >>  Urine Strep 9/4>> Negative Urine Culture 9/4>> negative Urine Legionella 9/4>> negative Sputum Culture 9/4>>  ANTIBIOTICS: Zosyn  8/31>>9/3 Unasyn 9/3>>9/4 Vanc 8/31>>8/31 Vanc 9/4>>9/06 Zosyn 9/4>>  SIGNIFICANT EVENTS: Intubated 8/31>> 9/2 Re-intubated 9/4>> 9/05  LINES/TUBES: femoral CVC >> 906  DISCUSSION: 83 yo male admitted with septic shock with cellulitis, cardiogenic shock with bradycardia, and hyperkalemia from bactrim.  Developed recurrent respiratory failure after aspiration event and acute pulmonary edema 9/04.  Also had encephalopathy related to medications.  ASSESSMENT / PLAN:  Acute hypoxic respiratory failure from aspiration and pulmonary edema.- - oxygen to keep SpO2 > 92% - f/u CXR intermittently - day 3 of zosyn; d/c vancomycin 9/06 - resume schedule lasix 9/06  NSTEMI. Hx of CAD, valvular heart disease. Acute on chronic combined CHF. WHO group 2 pulmonary HTN. - Cardiology following - Continue ASA, lipitor  Hyperkalemia on admission >> resolved.  Hypokalemia. Acute renal failure from ATN >> improved. - replace electrolytes as needed  Dysphagia. - f/u with speech therapy - continue panda tube and tube feeds for now  Anemia of critical illness and chronic disease. - f/u CBC  Cellulitis >> improved. - continue zosyn  Hyperglycemia. - SSI  Acute metabolic encephalopathy. Hx of Parkinson disease, restless leg syndrome. - continue amantadine, sinemet, requip  DVT prophylaxis - SQ heparin SUP - protonix Nutrition - tube feeds Goals of care - full code  Transfer to tele 9/06.  To [facility] 9/07 and PCCM off.  Updated pt's daughter  over the phone about plan of care.  Coralyn HellingVineet Alizaya Oshea, MD [facility] 11/06/2016, 9:51 AM Pager:  [phone] After 3pm call: [phone]

## 2016-11-06 NOTE — Progress Notes (Signed)
Progress Note  Patient Name: Jack Carter Date of Encounter: 11/06/2016  Primary Cardiologist: Mendel Ryder (new)  Subjective   Confused. Extubated. Complaining of buttocks discomfort.  Inpatient Medications    Scheduled Meds: . amantadine  100 mg Oral TID  . aspirin  81 mg Oral Daily  . atorvastatin  10 mg Oral QHS  . carbidopa-levodopa  1 tablet Oral TID  . feeding supplement (PRO-STAT SUGAR FREE 64)  30 mL Per Tube Daily  . Gerhardt's butt cream   Topical QID  . heparin subcutaneous  5,000 Units Subcutaneous Q8H  . insulin aspart  0-9 Units Subcutaneous Q4H  . mouth rinse  15 mL Mouth Rinse BID  . pantoprazole  40 mg Intravenous Q12H  . pneumococcal 23 valent vaccine  0.5 mL Intramuscular Tomorrow-1000  . primidone  50 mg Oral Q12H  . rOPINIRole  2 mg Oral Q12H   Continuous Infusions: . feeding supplement (JEVITY 1.2 CAL) 1,000 mL (11/06/16 0850)  . piperacillin-tazobactam (ZOSYN)  IV 3.375 g (11/06/16 0558)  . vancomycin Stopped (11/05/16 1532)   PRN Meds: albuterol, bisacodyl, sodium chloride flush, traMADol   Vital Signs    Vitals:   11/06/16 0500 11/06/16 0600 11/06/16 0700 11/06/16 0734  BP: 137/68 133/65 140/66   Pulse: 99 96 (!) 102   Resp: (!) 24 (!) 29 (!) 26   Temp:    98.9 F (37.2 C)  TempSrc:    Oral  SpO2: 99% 95% 99%   Weight:      Height:        Intake/Output Summary (Last 24 hours) at 11/06/16 0855 Last data filed at 11/06/16 0600  Gross per 24 hour  Intake          1451.33 ml  Output             2725 ml  Net         -1273.67 ml   Filed Weights   11/04/16 0500 11/05/16 0500 11/06/16 0430  Weight: 195 lb 5.2 oz (88.6 kg) 191 lb 5.8 oz (86.8 kg) 180 lb 12.4 oz (82 kg)    Telemetry    Sinus rhythm with occasional PVCs - Personally Reviewed  ECG    11/05/2016 tracing reveals Sinus rhythm with incomplete right bundle and diffuse precordial T-wave inversion. T-wave abnormality is new compared to admission. - Personally  Reviewed  Physical Exam  Elderly and chronically ill appearing GEN: No acute distress.  No cyanosis is noted Neck:  mild JVD but exam is hampered by patient's inability to cooperate. Cardiac: RRR, no murmurs, rubs, or gallops.  Respiratory: Clear to auscultation bilaterally. GI: Soft, nontender, non-distended  MS: No edema; No deformity. Scattered ecchymoses and skin tears are noted on extremities. Neuro:  Nonfocal . Confused. Psych: Anxiety  Labs    Chemistry Recent Labs Lab 10/31/16 1921  11/04/16 0447 11/04/16 1753 11/05/16 0552  NA 134*  < > 148* 146* 147*  K >7.5*  < > 2.8* 3.6 3.4*  CL 107  < > 115* 114* 115*  CO2 20*  < > GLUCOSE 160*  < > 126* 125* 139*  BUN 52*  < > 22* 22* 28*  CREATININE 2.99*  < > 1.78* 1.96* 1.98*  CALCIUM 8.5*  < > 7.6* 7.5* 7.5*  PROT 7.1  --   --   --   --   ALBUMIN 3.2*  --   --   --   --   AST 57*  --   --   --   --  ALT 27  --   --   --   --   ALKPHOS 195*  --   --   --   --   BILITOT 1.3*  --   --   --   --   GFRNONAA 18*  < > 34* 30* 30*  GFRAA 21*  < > 39* 35* 35*  ANIONGAP 7  < > < > = values in this interval not displayed.   Hematology Recent Labs Lab 11/04/16 0447 11/05/16 0552 11/06/16 0729  WBC 7.4 7.6 11.2*  RBC 3.41* 3.31* 3.49*  HGB 10.4* 10.0* 10.7*  HCT 32.8* 32.1* 33.6*  MCV 96.2 97.0 96.3  MCH 30.5 30.2 30.7  MCHC 31.7 31.2 31.8  RDW 16.0* 16.2* 16.4*  PLT 126* 122* 120*    Cardiac Enzymes Recent Labs Lab 11/04/16 0447 11/04/16 1329 11/04/16 1753 11/05/16 1107  TROPONINI 4.81* 3.33* 3.61* 2.01*    Recent Labs Lab 10/31/16 1952  TROPIPOC 0.11*     BNP Recent Labs Lab 10/31/16 2202  BNP 205.9*     DDimer No results for input(s): DDIMER in the last 168 hours.   Radiology    Dg Chest Port 1 View  Result Date: 11/06/2016 CLINICAL DATA:  Respiratory failure, previous CABG. EXAM: PORTABLE CHEST 1 VIEW COMPARISON:  Portable chest x-ray of November 05, 2016 FINDINGS: The  trachea has been extubated. The lungs remain adequately inflated. There is increased interstitial density throughout the right lung and in the left perihilar region more conspicuous today. The retrocardiac region on the left remains dense. There is no pneumothorax nor large pleural effusion. The patient has undergone previous CABG. The cardiac silhouette is mildly enlarged. The pulmonary vascularity is engorged and indistinct. There is calcification in the wall of the aortic arch. The sternal wires are intact. The feeding tube tip projects below the inferior margin of the image. IMPRESSION: Interval increase in interstitial density throughout the right lung and in the left perihilar region most compatible with CHF. Interstitial pneumonia is felt less likely. Persistent left basilar atelectasis or pneumonia. Thoracic aortic atherosclerosis. Electronically Signed   By: David  [place] M.D.   On: 11/06/2016 07:14   Portable Chest Xray  Result Date: 11/05/2016 CLINICAL DATA:  Intubated. EXAM: PORTABLE CHEST 1 VIEW COMPARISON:  Yesterday. FINDINGS: Endotracheal tube in satisfactory position. Nasogastric tube extending into the stomach. Stable enlarged cardiac silhouette and post CABG changes. No significant change in diffuse prominence of the interstitial markings. Slightly more patchy in upper lung zones is improved. Increased patchy and linear density at the right lung base and interval mild ill-defined increased density at the right lung base. Diffuse osteopenia. IMPRESSION: 1. Increased left basilar atelectasis or pneumonia. 2. Interval mild right basilar atelectasis. 3. Possible small bilateral pleural effusions. 4. No significant change in interstitial lung disease. This could represent a combination of chronic interstitial lung disease and interstitial edema. 5. Mildly improved somewhat more patchy opacity in both upper lung zones, possibly representing pneumonia or interstitial pneumonitis. Electronically Signed    By: Beckie Salts M.D.   On: 11/05/2016 07:17   Portable Chest Xray  Result Date: 11/04/2016 CLINICAL DATA:  Septic shock. Acute hypoxemic respiratory failure. Intubation. EXAM: PORTABLE CHEST 1 VIEW COMPARISON:  11/04/2016 FINDINGS: New endotracheal tube and nasogastric tube are seen in appropriate position. Bilateral perihilar predominant airspace disease shows no significant change, consistent with pulmonary edema. Heart size remains stable. Prior CABG again noted. No pneumothorax visualized. IMPRESSION: New endotracheal tube  and nasogastric tube in appropriate position. Stable pulmonary edema. Electronically Signed   By: Myles RosenthalJohn  Stahl M.D.   On: 11/04/2016 11:23    Cardiac Studies   Echocardiogram 11/03/2016: Study Conclusions  - Left ventricle: The cavity size was normal. Systolic function was   mildly to moderately reduced. The estimated ejection fraction was   in the range of 40% to 45%. Features are consistent with a   pseudonormal left ventricular filling pattern, with concomitant   abnormal relaxation and increased filling pressure (grade 2   diastolic dysfunction). - Ventricular septum: The contour showed diastolic flattening and   systolic flattening. These changes are consistent with RV volume   and pressure overload. - Aortic valve: There was trivial regurgitation. - Mitral valve: There was mild to moderate regurgitation directed   centrally. - Left atrium: The atrium was moderately dilated. - Right ventricle: The cavity size was moderately dilated. Wall   thickness was normal. Systolic function was moderately reduced. - Right atrium: The atrium was moderately dilated. - Atrial septum: No defect or patent foramen ovale was identified. - Tricuspid valve: There was malcoaptation of the valve leaflets.   There was severe regurgitation directed centrally. - Pulmonary arteries: Systolic pressure was moderately increased.   PA peak pressure: 60 mm Hg (S).   Patient Profile      83 y.o. male with a hx of prior CABG and elevated cardiac Grier Mittsmarkerswho is being seen today for the evaluation of elevated troponinat the request of Dr. Elvera Lennox. [facility] of [facility]. Suddenly developed respiratory distress and required intubation with transfer to ICU. Currently extubated but confused.  Assessment & Plan    1. Coronary artery disease with prior coronary bypass grafting and elevated troponins consistent with non-ST elevation myocardial infarction. There've also been evolutionary anterior ST-T change over the past 5 days. Patient currently pain-free. 2. Probable non-ST elevation myocardial infarction, is the revised diagnosis after taken into account the EKG changes noted from admission to current. 3. Chronic combined systolic and diastolic heart failure. Diuresis as tolerated by kidney function and blood pressure. 4. Abnormal EKG with incomplete right bundle and diffuse precordial T-wave inversion.  Overall plan supportive therapy. Ischemic evaluation with coronary angiography should be delayed until much improved and kidney function is stable. Also need to obtain data from prior cardiologist to determine if ischemic evaluation is warranted since LV function appears relatively stable compared to historical data.  Signed, Lesleigh NoeHenry W Michell Kader III, MD  11/06/2016, 8:55 AM

## 2016-11-06 NOTE — Progress Notes (Signed)
  Speech Language Pathology Treatment: Dysphagia  Patient Details Name: Jack Carter MRN: [id number] DOB: 01/20/1934 Today's Date: 11/06/2016 Time: [id number] SLP Time Calculation (min) (ACUTE ONLY): 14 min  Assessment / Plan / Recommendation Clinical Impression  Pt was seen for reassessment of swallowing s/p reintubation 9/4-9/5. His voice is rough in quality and he has frequent baseline coughing on his secretions. SLP provided a few pieces of ice with resultant coughing that was productive of secretions. His coughing overall was reduced after PO trials stopped. Given his dysphagia prior to reintubation and his reduced secretion management today, recommend that he remain NPO with temporary alternative means of nutrition to allow for time to complete swallowing exercises and facilitate oropharyngeal function and cough.    HPI HPI: 83 yr old male with Hx of parkinson disease, CAD s/p CABG, CHF who presented with 1 week history of weakness and fall at home, was unresponsive and required intubation. Admitted with septic and cardiogenic shock secondary to cellulitis and hyperkalemia. Severe hyperkalemia from AKI, use of K and bactrim. Severe bradycardia from hyperkalemia. Acute hypoxemic respiratory failure. Severe metabolic encephalopathy. Intubated 8/31-9/2. He was assessed by SLP after extubation wtih FEES recommending NPO status. He was then reintubated 9/4-9/5.      SLP Plan  Continue with current plan of care       Recommendations  Diet recommendations: NPO Medication Administration: Via alternative means                Oral Care Recommendations: Oral care QID Follow up Recommendations:  (tba) SLP Visit Diagnosis: Dysphagia, oropharyngeal phase (R13.12) Plan: Continue with current plan of care       GO                Jack Carter, Jack Carter 11/06/2016, 9:49 AM  Jack Carter, M.A. CCC-SLP [phone]

## 2016-11-07 ENCOUNTER — Inpatient Hospital Stay (HOSPITAL_COMMUNITY): Payer: Medicare Other

## 2016-11-07 DIAGNOSIS — G9340 Encephalopathy, unspecified: Secondary | ICD-10-CM

## 2016-11-07 DIAGNOSIS — E870 Hyperosmolality and hypernatremia: Secondary | ICD-10-CM

## 2016-11-07 DIAGNOSIS — T17908D Unspecified foreign body in respiratory tract, part unspecified causing other injury, subsequent encounter: Secondary | ICD-10-CM

## 2016-11-07 DIAGNOSIS — T17908A Unspecified foreign body in respiratory tract, part unspecified causing other injury, initial encounter: Secondary | ICD-10-CM

## 2016-11-07 DIAGNOSIS — A419 Sepsis, unspecified organism: Principal | ICD-10-CM

## 2016-11-07 DIAGNOSIS — E875 Hyperkalemia: Secondary | ICD-10-CM

## 2016-11-07 DIAGNOSIS — L03119 Cellulitis of unspecified part of limb: Secondary | ICD-10-CM

## 2016-11-07 DIAGNOSIS — R6521 Severe sepsis with septic shock: Secondary | ICD-10-CM

## 2016-11-07 LAB — BASIC METABOLIC PANEL
Anion gap: 8 (ref 5–15)
BUN: 33 mg/dL — AB (ref 6–20)
CALCIUM: 7.3 mg/dL — AB (ref 8.9–10.3)
CHLORIDE: 121 mmol/L — AB (ref 101–111)
CO2: 26 mmol/L (ref 22–32)
CREATININE: 1.81 mg/dL — AB (ref 0.61–1.24)
GFR calc non Af Amer: 33 mL/min — ABNORMAL LOW (ref >60)
GFR, EST AFRICAN AMERICAN: 39 mL/min — AB (ref >60)
Glucose, Bld: 131 mg/dL — ABNORMAL HIGH (ref 65–99)
Potassium: 3.5 mmol/L (ref 3.5–5.1)
SODIUM: 155 mmol/L — AB (ref 135–145)

## 2016-11-07 LAB — GLUCOSE, CAPILLARY
GLUCOSE-CAPILLARY: 125 mg/dL — AB (ref 65–99)
GLUCOSE-CAPILLARY: 161 mg/dL — AB (ref 65–99)
GLUCOSE-CAPILLARY: 121 mg/dL — AB (ref 65–99)
GLUCOSE-CAPILLARY: 102 mg/dL — AB (ref 65–99)
Glucose-Capillary: 133 mg/dL — ABNORMAL HIGH (ref 65–99)
Glucose-Capillary: 136 mg/dL — ABNORMAL HIGH (ref 65–99)
Glucose-Capillary: 105 mg/dL — ABNORMAL HIGH (ref 65–99)

## 2016-11-07 LAB — TROPONIN I: Troponin I: 0.82 ng/mL (ref <0.03)

## 2016-11-07 MED ORDER — FUROSEMIDE 10 MG/ML IJ SOLN
40.0000 mg | Freq: Two times a day (BID) | INTRAMUSCULAR | Status: AC
  Administered 2016-11-08: 40 mg via INTRAVENOUS
  Filled 2016-11-07 (×2): qty 4

## 2016-11-07 MED ORDER — FUROSEMIDE 10 MG/ML IJ SOLN
INTRAMUSCULAR | Status: AC
  Filled 2016-11-07: qty 4

## 2016-11-07 MED ORDER — FLUCONAZOLE IN SODIUM CHLORIDE 200-0.9 MG/100ML-% IV SOLN
200.0000 mg | Freq: Once | INTRAVENOUS | Status: AC
  Administered 2016-11-07: 200 mg via INTRAVENOUS
  Filled 2016-11-07: qty 100

## 2016-11-07 MED ORDER — FUROSEMIDE 10 MG/ML IJ SOLN
40.0000 mg | Freq: Once | INTRAMUSCULAR | Status: AC
  Administered 2016-11-07: 40 mg via INTRAVENOUS

## 2016-11-07 MED ORDER — NITROGLYCERIN IN D5W 200-5 MCG/ML-% IV SOLN: 0.0000 ug/min | INTRAVENOUS | Status: DC

## 2016-11-07 MED ORDER — HEPARIN (PORCINE) IN NACL 100-0.45 UNIT/ML-% IJ SOLN
1500.0000 [IU]/h | INTRAMUSCULAR | Status: DC
  Administered 2016-11-07: 1000 [IU]/h via INTRAVENOUS
  Administered 2016-11-08: 1150 [IU]/h via INTRAVENOUS
  Administered 2016-11-09 – 2016-11-10 (×2): 1300 [IU]/h via INTRAVENOUS
  Administered 2016-11-11 – 2016-11-12 (×3): 1400 [IU]/h via INTRAVENOUS
  Filled 2016-11-07 (×11): qty 250

## 2016-11-07 MED ORDER — NITROGLYCERIN 0.4 MG SL SUBL
0.4000 mg | SUBLINGUAL_TABLET | SUBLINGUAL | Status: DC | PRN
  Administered 2016-11-07 (×2): 0.4 mg via SUBLINGUAL

## 2016-11-07 MED ORDER — NITROGLYCERIN IN D5W 200-5 MCG/ML-% IV SOLN
10.0000 ug/min | INTRAVENOUS | Status: DC
  Administered 2016-11-07: 5 ug/min via INTRAVENOUS
  Administered 2016-11-09 – 2016-11-10 (×2): 30 ug/min via INTRAVENOUS
  Filled 2016-11-07 (×3): qty 250

## 2016-11-07 MED ORDER — FLUCONAZOLE IN SODIUM CHLORIDE 100-0.9 MG/50ML-% IV SOLN
100.0000 mg | INTRAVENOUS | Status: DC
  Administered 2016-11-08 – 2016-11-14 (×7): 100 mg via INTRAVENOUS
  Filled 2016-11-07 (×8): qty 50

## 2016-11-07 MED ORDER — HEPARIN BOLUS VIA INFUSION
2000.0000 [IU] | Freq: Once | INTRAVENOUS | Status: AC
  Administered 2016-11-07: 2000 [IU] via INTRAVENOUS
  Filled 2016-11-07: qty 2000

## 2016-11-07 MED ORDER — FREE WATER
200.0000 mL | Freq: Three times a day (TID) | Status: DC
  Administered 2016-11-07 (×2): 200 mL
  Administered 2016-11-07: 75 mL
  Administered 2016-11-08: 200 mL

## 2016-11-07 MED ORDER — METOPROLOL TARTRATE 5 MG/5ML IV SOLN
2.5000 mg | Freq: Four times a day (QID) | INTRAVENOUS | Status: DC
  Administered 2016-11-07 – 2016-11-12 (×13): 2.5 mg via INTRAVENOUS
  Filled 2016-11-07 (×25): qty 5

## 2016-11-07 MED ORDER — FUROSEMIDE 40 MG PO TABS
40.0000 mg | ORAL_TABLET | Freq: Every day | ORAL | Status: DC
  Administered 2016-11-07: 40 mg
  Filled 2016-11-07 (×3): qty 1

## 2016-11-07 MED ORDER — NITROGLYCERIN 0.4 MG SL SUBL
SUBLINGUAL_TABLET | SUBLINGUAL | Status: AC
  Filled 2016-11-07: qty 1

## 2016-11-07 MED ORDER — HEPARIN BOLUS VIA INFUSION
4000.0000 [IU] | Freq: Once | INTRAVENOUS | Status: DC
  Filled 2016-11-07: qty 4000

## 2016-11-07 NOTE — Progress Notes (Addendum)
Cardiology Subjective: According to nursing staff the patient was being helped back to bed and suddenly developed respiratory distress. No accompanying arrhythmia, chest discomfort, aspiration, or other complaint. E-Link physician ordered sublingual nitroglycerin, IV Lasix 40 mg, and BiPAP. This is lead to improvement.  Objective: Blood pressure 128/60, heart rate 80 bpm,  On BiPAP the patient skinned is pink.  He is somewhat somnolent but arouses and is able to answer questions. Specifically denies chest discomfort. He moves all extremities.  Chest reveals scattered rhonchi  Cardiac exam reveals irregular rhythm. An S4 gallop. An apical systolic murmur is heard but is unchanged from prior.  Extremities demonstrate trace to 1+ edema.  Neurologically he is quiet but arouses and answers questions.  Electrocardiogram performed at 6:06 PM reveals sinus tachycardia, minimal AVR ST elevation, the 2 and V3 ST depression. No diagnostic STEMI criteria.  Currently on BiPAP O2 saturations are above 93%.  Impression: 1. Suspect acute pulmonary edema although no ischemic chest pain. If this is the case, he may have a large ischemic burden. My assumption is that he has a LIMA to the LAD but we don't have any records from [place]. 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 We need to treat as ACS. 2. Hypernatremia is concerning  Recommendation:  IV nitroglycerin  IV heparin  More aggressive diuresis  Portable chest x-ray  Not a candidate for invasive strategy currently in absence of STEMI EKG or ongoing chest pain.  Restrict fluid.  Monitor electrolytes closely  Guarded overall prognosis.  CC time 30 minutes

## 2016-11-07 NOTE — Evaluation (Signed)
Physical Therapy Evaluation Patient Details Name: Jack Carter MRN: [id number] DOB: 1933/12/06 Today's Date: 11/07/2016   History of Present Illness  Pt admitted 10/31/16 after falling with LOC with cardiogenic shock, severe hypokalemia, AKI for rhabdo, acute hypoxic respiratory failure requiring intubation, extubated 11/05/16. PMH: CAD with CABG, CHF, R LE cellulitis, Parkinsons. Pt recently moved from [place] to his daughter's home.  Clinical Impression  Patient presents with pain, weakness, anxiety, impaired activity tolerance and impaired mobility s/p above. Tolerated rolling and getting to EOB with total A of 2. Pt limited by pain in RLE and buttocks, increased anxiety and difficulty breathing during session. Required max cues for relaxation and to calm down. Sp02 dropped to mid 80s on 5L/min 02. Pt reports Mod I with RW PTA and lives with daughter. No family members present to provide PLOF. Would benefit from SNF to maximize independence and mobility prior to return home. Will follow acutely.    Follow Up Recommendations SNF;Supervision for mobility/OOB    Equipment Recommendations  None recommended by PT    Recommendations for Other Services       Precautions / Restrictions Precautions Precautions: Fall Precaution Comments: watch 02 Restrictions Weight Bearing Restrictions: No      Mobility  Bed Mobility Overal bed mobility: Needs Assistance Bed Mobility: Rolling;Supine to Sit;Sit to Supine Rolling: Total assist   Supine to sit: Total assist;+2 for physical assistance Sit to supine: Total assist;+2 for physical assistance   General bed mobility comments: Pt with poor tolerance of bed level mobility and sitting due to pain.  Transfers                 General transfer comment: unable to attempt  Ambulation/Gait                Stairs            Wheelchair Mobility    Modified Rankin (Stroke Patients Only)       Balance Overall balance  assessment: Needs assistance Sitting-balance support: Feet supported;Bilateral upper extremity supported Sitting balance-Leahy Scale: Zero Sitting balance - Comments: pt with posterior lean and poor tolerance of sitting due to pain Postural control: Posterior lean                                   Pertinent Vitals/Pain Pain Assessment: Faces Faces Pain Scale: Hurts worst Pain Location: bottom sitting EOB; RLE Pain Descriptors / Indicators: Grimacing;Crying;Guarding;Restless;Moaning Pain Intervention(s): Monitored during session;Repositioned;Relaxation;Limited activity within patient's tolerance;Heat applied    Home Living Family/patient expects to be discharged to:: Private residence Living Arrangements: Children (daughter) Available Help at Discharge: Family;Available 24 hours/day Type of Home: House Home Access: Stairs to enter Entrance Stairs-Rails: Right;Left;Can reach both Entrance Stairs-Number of Steps: 4 Home Layout: Two level;Able to live on main level with bedroom/bathroom Home Equipment: Dan Humphreys - 2 wheels;Walker - 4 wheels;Cane - single point;Bedside commode;Shower seat;Wheelchair - manual;Hand held shower head;Grab bars - tub/shower      Prior Function Level of Independence: Independent with assistive device(s)         Comments: walks with walker, reports being able to care for himself and do housekeeping and meal prep     Hand Dominance   Dominant Hand: Right    Extremity/Trunk Assessment   Upper Extremity Assessment Upper Extremity Assessment: Defer to OT evaluation    Lower Extremity Assessment Lower Extremity Assessment: Generalized weakness       Communication  Communication: No difficulties  Cognition Arousal/Alertness: Awake/alert Behavior During Therapy: Anxious (crying) Overall Cognitive Status: Impaired/Different from baseline Area of Impairment: Orientation;Attention;Memory;Following commands;Problem solving                  Orientation Level: Disoriented to;Time;Situation Current Attention Level: Sustained Memory: Decreased short-term memory Following Commands: Follows one step commands inconsistently;Follows one step commands with increased time     Problem Solving: Slow processing;Decreased initiation;Difficulty sequencing;Requires verbal cues;Requires tactile cues General Comments: pt highly focused on pain in his buttocks.      General Comments General comments (skin integrity, edema, etc.): Sp02 ranging in mid 80s on 5L/min 02 due to anxiety and getting worked up.    Exercises     Assessment/Plan    PT Assessment Patient needs continued PT services  PT Problem List Decreased strength;Decreased mobility;Decreased cognition;Pain;Decreased balance;Decreased activity tolerance;Cardiopulmonary status limiting activity       PT Treatment Interventions Therapeutic activities;Gait training;Therapeutic exercise;Patient/family education;Balance training;Functional mobility training    PT Goals (Current goals can be found in the Care Plan section)  Acute Rehab PT Goals Patient Stated Goal: pain relief PT Goal Formulation: With patient Time For Goal Achievement: 11/21/16 Potential to Achieve Goals: Fair    Frequency Min 3X/week   Barriers to discharge Inaccessible home environment stairs    Co-evaluation PT/OT/SLP Co-Evaluation/Treatment: Yes Reason for Co-Treatment: For patient/therapist safety;Necessary to address cognition/behavior during functional activity PT goals addressed during session: Mobility/safety with mobility;Strengthening/ROM OT goals addressed during session: ADL's and self-care;Strengthening/ROM       AM-PAC PT "6 Clicks" Daily Activity  Outcome Measure Difficulty turning over in bed (including adjusting bedclothes, sheets and blankets)?: Unable Difficulty moving from lying on back to sitting on the side of the bed? : Unable Difficulty sitting down on and standing  up from a chair with arms (e.g., wheelchair, bedside commode, etc,.)?: Unable Help needed moving to and from a bed to chair (including a wheelchair)?: Total Help needed walking in hospital room?: Total Help needed climbing 3-5 steps with a railing? : Total 6 Click Score: 6    End of Session Equipment Utilized During Treatment: Oxygen Activity Tolerance: Patient limited by pain Patient left: in bed;with call bell/phone within reach Nurse Communication: Mobility status PT Visit Diagnosis: Pain;Muscle weakness (generalized) (M62.81);Difficulty in walking, not elsewhere classified (R26.2) Pain - Right/Left: Left Pain - part of body: Leg (buttocks)    Time: 1610-96041312-1344 PT Time Calculation (min) (ACUTE ONLY): 32 min   Charges:   PT Evaluation $PT Eval Moderate Complexity: 1 Mod     PT G CodesMylo Red:        Francee Setzer, PT, DPT [phone]    Blake DivineShauna A Darek Eifler 11/07/2016, 2:57 PM

## 2016-11-07 NOTE — Progress Notes (Addendum)
eLink Physician-Brief Progress Note Patient Name: Midge MiniumCharles L Gaspari DOB: 04/04/1933 MRN: [id number]   Date of Service  11/07/2016  HPI/Events of Note  EKG -  Poor data quality, interpretation may be adversely affected Undetermined rhythm ST & T wave abnormality, consider inferior ischemia ST & T wave abnormality, consider anterolateral ischemia Prolonged QT  eICU Interventions  Will order: 1. Metoprolol 2.5 mg IV Q 6 hours. Hold dose for SBP < 105 or HR < 60.  2. Heparin IV infusion per pharmacy consult. 3. Nitroglycerin IV infusion.  4. Will ask Cardiology to re-evaluate him.      Intervention Category Major Interventions: Hypoxemia - evaluation and management  Aziz Slape Eugene 11/07/2016, 6:12 PM

## 2016-11-07 NOTE — Evaluation (Signed)
Occupational Therapy Evaluation Patient Details Name: Jack Carter MRN: [id number] DOB: [birth year] Today's Date: 11/07/2016    History of Present Illness Pt admitted 10/31/16 after falling with LOC with cardiogenic shock, severe hypokalemia, AKI for rhabdo, acute hypoxic respiratory failure requiring intubation, extubated 11/05/16. PMH: CAD with CABG, CHF, R LE cellulitis, Parkinsons. Pt recently moved from [place] to his daughter's home.   Clinical Impression   Pt reports walking with a walker and being independent in ADL and IADL prior to admission. No family available to confirm PLOF. Pt presents with severe pain in his buttocks and in his R LE. Buttock pain aggravated by loose BM during session. Pt crying with discomfort. Pt currently requires total assist for ADL and was unable to tolerate sitting EOB. Will follow acutely. Recommend d/c to SNF for continued therapy.   Follow Up Recommendations  SNF;Supervision/Assistance - 24 hour    Equipment Recommendations       Recommendations for Other Services       Precautions / Restrictions        Mobility Bed Mobility Overal bed mobility: Needs Assistance Bed Mobility: Rolling;Supine to Sit;Sit to Supine Rolling: Total assist   Supine to sit: Total assist;+2 for physical assistance Sit to supine: Total assist;+2 for physical assistance   General bed mobility comments: Pt with poor tolerance of bed level mobility and sitting due to pain.  Transfers                 General transfer comment: unable to attempt    Balance Overall balance assessment: Needs assistance Sitting-balance support: Feet supported;Bilateral upper extremity supported Sitting balance-Leahy Scale: Zero Sitting balance - Comments: pt with posterior lean and poor tolerance of sitting                                   ADL either performed or assessed with clinical judgement   ADL                                          General ADL Comments: Cortrak in place, requires total assist.     Vision Patient Visual Report: No change from baseline       Perception     Praxis      Pertinent Vitals/Pain Pain Assessment: Faces Faces Pain Scale: Hurts even more Pain Location: grimacing and pulling away when trying to do oral care Pain Descriptors / Indicators: Grimacing;Guarding Pain Intervention(s): Limited activity within patient's tolerance;Monitored during session     Hand Dominance Right   Extremity/Trunk Assessment Upper Extremity Assessment Upper Extremity Assessment: Generalized weakness   Lower Extremity Assessment Lower Extremity Assessment: Defer to PT evaluation       Communication Communication Communication: No difficulties   Cognition Arousal/Alertness: Awake/alert Behavior During Therapy: Anxious (crying) Overall Cognitive Status: Impaired/Different from baseline Area of Impairment: Orientation;Attention;Memory;Following commands;Problem solving                 Orientation Level: Disoriented to;Time;Situation Current Attention Level: Sustained Memory: Decreased short-term memory Following Commands: Follows one step commands inconsistently;Follows one step commands with increased time     Problem Solving: Slow processing;Decreased initiation;Difficulty sequencing;Requires verbal cues;Requires tactile cues General Comments: pt with highly focused on pain in his buttocks   General Comments       Exercises  Shoulder Instructions      Home Living Family/patient expects to be discharged to:: Private residence Living Arrangements: Children (daughter) Available Help at Discharge: Family;Available 24 hours/day Type of Home: House Home Access: Stairs to enter Entergy CorporationEntrance Stairs-Number of Steps: 4 Entrance Stairs-Rails: Right;Left;Can reach both Home Layout: Two level;Able to live on main level with bedroom/bathroom     Bathroom Shower/Tub: Tub/shower unit    Bathroom Toilet: Handicapped height     Home Equipment: Environmental consultantWalker - 2 wheels;Walker - 4 wheels;Cane - single point;Bedside commode;Shower seat;Wheelchair - manual;Hand held shower head;Grab bars - tub/shower          Prior Functioning/Environment Level of Independence: Independent with assistive device(s)        Comments: walks with walker, reports being able to care for himself and do housekeeping and meal prep        OT Problem List: Decreased strength;Decreased activity tolerance;Impaired balance (sitting and/or standing);Decreased knowledge of use of DME or AE;Pain;Impaired UE functional use;Cardiopulmonary status limiting activity;Decreased cognition;Decreased safety awareness;Decreased coordination      OT Treatment/Interventions: Self-care/ADL training;Neuromuscular education;DME and/or AE instruction;Therapeutic activities;Patient/family education;Balance training;Cognitive remediation/compensation    OT Goals(Current goals can be found in the care plan section) Acute Rehab OT Goals Patient Stated Goal: pain relief OT Goal Formulation: With patient Time For Goal Achievement: 11/21/16 Potential to Achieve Goals: Fair ADL Goals Pt Will Perform Grooming: with min assist;sitting Pt Will Perform Upper Body Bathing: with mod assist;sitting Pt Will Perform Upper Body Dressing: with mod assist;sitting Pt Will Transfer to Toilet: with +2 assist;with min assist;stand pivot transfer;bedside commode Additional ADL Goal #1: Pt will perform bed mobility with moderate assist. Additional ADL Goal #2: Pt will sit EOB x 10 minutes with min guard assist for ADL.  OT Frequency: Min 2X/week   Barriers to D/C:            Co-evaluation PT/OT/SLP Co-Evaluation/Treatment: Yes Reason for Co-Treatment: For patient/therapist safety;Necessary to address cognition/behavior during functional activity   OT goals addressed during session: ADL's and self-care;Strengthening/ROM      AM-PAC PT  "6 Clicks" Daily Activity     Outcome Measure Help from another person eating meals?: Total Help from another person taking care of personal grooming?: Total Help from another person toileting, which includes using toliet, bedpan, or urinal?: Total Help from another person bathing (including washing, rinsing, drying)?: Total Help from another person to put on and taking off regular upper body clothing?: Total Help from another person to put on and taking off regular lower body clothing?: Total 6 Click Score: 6   End of Session Equipment Utilized During Treatment: Oxygen (4L) Nurse Communication: Mobility status (aware of loose BM)  Activity Tolerance: Patient limited by pain Patient left: in bed;with call bell/phone within reach;with bed alarm set  OT Visit Diagnosis: Muscle weakness (generalized) (M62.81);Other symptoms and signs involving cognitive function;Pain                Time: [id number] OT Time Calculation (min): 32 min Charges:  OT General Charges $OT Visit: 1 Visit OT Evaluation $OT Eval High Complexity: 1 High G-Codes:     11/07/2016 Martie RoundJulie Chanetta Moosman, OTR/L Pager: [phone]  Iran PlanasMayberry, [place] BailiffJulie Lynn 11/07/2016, 2:28 PM

## 2016-11-07 NOTE — Progress Notes (Signed)
Patient placed back on BiPAP due to acute onset of shortness of breath. Patient tachypneic with rate in the 50s, tachycardic. Patient placed on BiPAP.

## 2016-11-07 NOTE — Progress Notes (Signed)
PULMONARY / CRITICAL CARE MEDICINE   Name: Jack Carter MRN: [id number] DOB: 04-13-1933    ADMISSION DATE:  10/31/2016 CONSULTATION DATE:  11/07/2016  REFERRING MD:  Dr. Janee Morn   CHIEF COMPLAINT:  Acute Respiratory Distress   Brief:   83 yo male admitted with weakness.  Was being tx for cellulitis.  Found to have bradycardia and hypotension, hyperkalemia.  Required intubation for airway, but successfully extubated and transferred out of ICU.  Returned to ICU 9/04 with respiratory failure likely from aspiration and acute pulmonary edema.  PMHx of Parkinson disease, CAD s/p CABG, CHF.  SUBJECTIVE:  Upon moving beds patient became hypoxic into the 50's with ?flash pulmonary edema. Patient placed on BIPAP.   VITAL SIGNS: BP 132/71   Pulse (!) 39   Temp (!) 100.8 F (38.2 C) (Oral) Comment: RN Valerie aware   Resp (!) 26   Ht  (1.753 m)   Wt 83 kg (182 lb 15.7 oz)   SpO2 97%   BMI 27.02 kg/m   HEMODYNAMICS:    VENTILATOR SETTINGS:    INTAKE / OUTPUT: I/O last 3 completed shifts: In: 2668 [NG/GT:2518; IV Piggyback:150] Out: 3500 [Urine:3500]  PHYSICAL EXAMINATION: General:  Adult male, no distress  Neuro: Alert, follows commands  HEENT:  On BIPAP Cardiovascular:  Irregular, no MRG  Lungs:  Crackles at bases, non-labored no wheeze  Abdomen:  Obese, active bowel sounds  Musculoskeletal:  -edema  Skin:  Warm, dry, intact   LABS:  BMET  Recent Labs Lab 11/04/16 1753 11/05/16 0552 11/06/16 0729  NA 146* 147* 149*  K 3.6 3.4* 3.3*  CL 114* 115* 114*  CO2 BUN 22* 28* 27*  CREATININE 1.96* 1.98* 1.84*  GLUCOSE 125* 139* 119*    Electrolytes  Recent Labs Lab 11/04/16 1753 11/05/16 0552 11/06/16 0729  CALCIUM 7.5* 7.5* 8.0*  MG 2.2 2.1 1.8  PHOS 1.8*  --   --     CBC  Recent Labs Lab 11/04/16 0447 11/05/16 0552 11/06/16 0729  WBC 7.4 7.6 11.2*  HGB 10.4* 10.0* 10.7*  HCT 32.8* 32.1* 33.6*  PLT 126* 122* 120*     Coag's  Recent Labs Lab 10/31/16 1921  APTT 21*  INR 1.10    Sepsis Markers  Recent Labs Lab 10/31/16 1936 10/31/16 2230  LATICACIDVEN 2.05* 4.11*    ABG  Recent Labs Lab 11/01/16 0225 11/02/16 0318 11/04/16 1410  PHART 7.461* 7.376 7.438  PCO2ART 34.3 41.4 36.1  PO2ART 402.0* 78.7* 139.0*    Liver Enzymes  Recent Labs Lab 10/31/16 1921  AST 57*  ALT 27  ALKPHOS 195*  BILITOT 1.3*  ALBUMIN 3.2*    Cardiac Enzymes  Recent Labs Lab 11/04/16 1753 11/05/16 1107 11/06/16 0729  TROPONINI 3.61* 2.01* 1.29*    Glucose  Recent Labs Lab 11/06/16 1936 11/06/16 2353 11/07/16 0537 11/07/16 0848 11/07/16 1218 11/07/16 1509  GLUCAP 125* 86 161* 133* 121* 136*    Imaging Dg Chest Port 1 View  Result Date: 11/07/2016 CLINICAL DATA:  Congestive heart failure. EXAM: PORTABLE CHEST 1 VIEW COMPARISON:  Radiograph of November 06, 2016. FINDINGS: Stable cardiomediastinal silhouette. Atherosclerosis of thoracic aorta is noted. Status post coronary artery bypass graft. Feeding tube is seen entering stomach. No pneumothorax is noted. Stable right lung densities are noted as well as left perihilar densities consistent with pulmonary edema or possibly pneumonia. Stable left basilar edema or atelectasis or infiltrate is noted with probable minimal associated pleural effusion. IMPRESSION:  Stable bilateral lung opacities as described above, concerning for edema or possibly pneumonia. Aortic atherosclerosis. Electronically Signed   By: Lupita RaiderJames  Green Jr, M.D.   On: 11/07/2016 11:50    STUDIES:  CT head 9/01 > chronic microvascular ischemic changes, Rt maxillary sinus opacification Echo 9/03 > EF 40 to 45%, grade 2 DD, mild/mod MR, severe TR, PAS 60 mmHg  CULTURES: Blood 8/31 >> negative Urine 9/01 >> negative Blood 9/4 > GPC in clusters > staphylococcus    Urine Strep 9/4> Negative Urine Culture 9/4> negative Urine Legionella 9/4> negative Sputum Culture 9/4 >  few candida albicans   ANTIBIOTICS: Zosyn  8/31>>9/3 Unasyn 9/3>>9/4 Vanc 8/31>>8/31 Vanc 9/4>>9/06 Zosyn 9/4>>  SIGNIFICANT EVENTS: Intubated 8/31 > 9/2 Re-intubated 9/4 > 9/05  LINES/TUBES: femoral CVC > 9/6  DISCUSSION: 83 yo male admitted with septic shock with cellulitis, cardiogenic shock with bradycardia, and hyperkalemia from bactrim.  Developed recurrent respiratory failure after aspiration event and acute pulmonary edema 9/04.  Also had encephalopathy related to medications  ASSESSMENT / PLAN:   STEMI > Inferior ischemia, Anterolateral ischemia ? Demand related to hypoxia vs pulmonary edema  Plan  -Cardiology at bedside  -Heparin gtt and Nitro per cardiology  -Trend Troponin   Acute hypoxic respiratory failure from aspiration and pulmonary edema Plan  -Obtain CXR -Monitor for need for intubation  -BIPAP PRN  -Maintain Saturation >92  -Lasix 40 meq given   Acute on Chronic Combined CHF  Pulmonary HTN NSTEMI H/O CAD, Valvular Heart Diease  Plan  -Cardiology Following  Dysphagia Plan  - f/u with speech therapy - continue panda tube and tube feeds for now  Anemia of critical illness and chronic disease Plan  -Trend CBC   Cellulitis >> improved Plan  - continue zosyn  Hyperglycemia Plan  -Trend Glucose  - SSI  Acute metabolic encephalopathy. H/O Parkinson disease, restless leg syndrome Plan  - continue amantadine, sinemet, requip  FAMILY  - Updates: Daughter updated. Wants to proceed with full code status.   - Inter-disciplinary family meet or Palliative Care meeting due by: Ongoing     Jovita KussmaulKatalina Eubanks, AGACNP-BC [facility]  Pgr: [phone]  PCCM Pgr: [phone]

## 2016-11-07 NOTE — Progress Notes (Signed)
  Speech Language Pathology Treatment: Dysphagia  Patient Details Name: Jack Carter MRN: [id number] DOB: 08/26/1933 Today's Date: 11/07/2016 Time: 1350-1410 SLP Time Calculation (min) (ACUTE ONLY): 20 min  Assessment / Plan / Recommendation Clinical Impression  At baseline pt is moaning and humming in the bed with RR in the high 30s. He grimaces and withdraws from attempts at oral care, saying it is too "rough" even when swab merely grazes his lips or his teeth. This only increases his restlessness and his RR, therefore oral care is limited without good friction established. Pt is starting to have a coating across his teeth and tongue, likely because he is not allowing other staff to perform oral care as well. Pt consumed ice chips and small spoonfuls of water that appear to calm him, as he remains still and his RR drops into the mid to high 20s while he lets the ice melt in his mouth. He still has pretty consistent coughing associated with small boluses. SLP provided Mod cues for effortful swallows. I discussed with pt that in order to allow him even ice chips PRN, we would need for him to tolerate having his oral cavity cleaned. If he will allow sufficient oral care with friction, he could have a few single pieces of ice afterwards. If he does not allow for oral care, I would hold POs. Will f/u for additional swallowing tx.    HPI HPI: 83 yr old male with Hx of parkinson disease, CAD s/p CABG, CHF who presented with 1 week history of weakness and fall at home, was unresponsive and required intubation. Admitted with septic and cardiogenic shock secondary to cellulitis and hyperkalemia. Severe hyperkalemia from AKI, use of K and bactrim. Severe bradycardia from hyperkalemia. Acute hypoxemic respiratory failure. Severe metabolic encephalopathy. Intubated 8/31-9/2. He was assessed by SLP after extubation wtih FEES recommending NPO status. He was then reintubated 9/4-9/5.      SLP Plan  Continue with  current plan of care       Recommendations  Diet recommendations: NPO Medication Administration: Via alternative means                Oral Care Recommendations: Oral care QID Follow up Recommendations:  (tba) SLP Visit Diagnosis: Dysphagia, oropharyngeal phase (R13.12) Plan: Continue with current plan of care       GO                Maxcine Hamaiewonsky, Dahiana Kulak 11/07/2016, 2:21 PM  Maxcine HamLaura Paiewonsky, M.A. CCC-SLP [phone]

## 2016-11-07 NOTE — Progress Notes (Signed)
PROGRESS NOTE    Jack Carter  ZOX:[id number] DOB: 03-30-1933 DOA: 10/31/2016 PCP: No primary care provider on file.    Brief Narrative: Patient is a 83 year old gentleman who was admitted with generalized weakness with septic shock with cellulitis. It was noted during the hospitalization to have cardiogenic shock with bradycardia hypotension hyperkalemia from Bactrim. Patient required reintubation and successfully extubated and transferred out the ICU. Patient return back to the ICU on 11/04/2016 with respiratory failure likely secondary to aspiration of acute pulmonary edema. Patient was on the critical care service. Patient subsequently extubated and transferred to the hospitalist service. Cardiology following.   Assessment & Plan:   Principal Problem:   Cardiogenic shock (HCC) Active Problems:   Septic shock (HCC)   Cellulitis   Hyperkalemia   AKI (acute kidney injury) (HCC)   Acute hypoxemic respiratory failure (HCC)   CAD (coronary artery disease) of artery bypass graft   CHF (congestive heart failure) (HCC)   Parkinson disease (HCC)   Upper GI bleed   Elevated troponin   Acute encephalopathy   Goals of care, counseling/discussion  #1 acute hypoxia respiratory failure secondary to aspiration and pulmonary edema/CHF exacerbation Patient extubated as speaking in full sentences. Chest x-ray from 11/06/2016 consistent with volume overload. Tracheal aspirate positive for Candida species. It urine pneumococcus antigen negative. Urine Legionella antigen negative. discontinued on 11/06/2016. IV Diflucan 200 mg 1, then 100 mg daily. Oral Lasix per tube has been resumed. Strict I's and O's. Daily weights. Repeat chest x-ray. Cardiology following.  #2 acute on chronic combined systolic and diastolic heart failure Likely secondary to cardiogenic shock and NSTEMI. 2-D echo with EF of 40-45%, grade 2 diastolic dysfunction, changes noted consistent with right ventricular volume and  pressure overload. Patient with a urine output of 2.225 L over the past 24 hours. Patient still volume overloaded clinically on examination. Repeat chest x-ray. Patient currently on oral Lasix. Cardiology following and will defer changing of oral Lasix to IV Lasix per cardiology. Strict I's and O's. Daily weights.  #3 non-STEMI/cardiogenic shock/coronary artery disease Questionable etiology. 2-D echo with a EF of 40-45%, grade 2 diastolic dysfunction. Changes noted consistent with right ventricular volume and pressure overload. Patient status post CABG. Patient with elevated troponins consistent with non-STEMI. Patient currently chest pain-free. Per cardiology ischemic evaluation with coronary angiography should be delayed until patient is much improved and kidney function is stable. Continue aspirin, Lipitor, Lasix. Per cardiology.  #4 hyperkalemia Likely secondary to ongoing potassium supplementation and the presence of Diovan/Bactrim in the setting of acute kidney injury. Patient was seen by nephrology were recommended medical management. Patient now hypokalemic with a potassium of 3.3. Follow.  #4 hypokalemia Replete.  #5 dysphagia Patient status post pended to currently receiving Jevity tube feeds. Speech therapy following.  #6 hypernatremia Placed on free water flushes. Follow.  #7 cellulitis Improved. Continue Zosyn.  #8 acute metabolic encephalopathy Patient with history of Parkinson's disease, restless leg syndrome. Likely close to baseline. Continue amantadine, Sinemet, Requip.  #9 anemia of chronic disease Patient with no overt bleeding. H&H stable.  #10 acute kidney injury The nephrology hemodynamically mediated with possible ATN secondary to shock versus secondary to Bactrim. Baseline creatinine unknown. Renal ultrasound negative for any hydro-nephrosis. Follow.   DVT prophylaxis: Heparin Code Status: Full Family Communication: Updated patient. No family at  bedside. Disposition Plan: Awaiting stepdown bed. Disposition pending hospital course and PT evaluation.   Consultants:   Pccm: Dr. Molli Knock 11/04/2016/Dr. Sood 11/05/2016  Nephrology: Dr. Allena Katz  10/31/2016  Cardiology: Dr. Katrinka Blazing 11/04/2016  Procedures:   CT head without contrast 11/01/2016  Chest x-ray 10/31/2016, 11/04/2016, 11/05/2016, 11/06/2016  Renal ultrasound 10/31/2016  2-D echo 11/03/2016--- EF 40-45% with grade 2 diastolic dysfunction. Right ventricular volume pressure overload. Mild to moderate mitral valvular regurgitation. Moderately dilated left HM. Moderately dilated) sugar. Right ventricular systolic function moderately reduced. Right atrium moderately dilated.  Antimicrobials:   Zosyn  8/31>>9/3 Unasyn 9/3>>9/4 Vanc 8/31>>8/31 Vanc 9/4>>9/06 Zosyn 9/4>> IV Diflucan 11/07/2016  SIGNIFICANT EVENTS: Intubated 8/31>> 9/2 Re-intubated 9/4>> 9/05  LINES/TUBES: femoral CVC >> 906  CULTURES: Blood 8/31 >> negative Urine 9/01 >> negative Blood 9/4>> GPC in clusters >>  Urine Strep 9/4>> Negative Urine Culture 9/4>> negative Urine Legionella 9/4>> negative Sputum Culture/tracheal aspirate 9/4>> few Candida Krusei, moderate candida out the cans   Subjective: Patient laying in bed grunting and groaning. Patient denies any shortness of breath. Patient denies any chest pain. Patient asking when he can go to a different room out of the ICU.  Objective: Vitals:   11/07/16 0700 11/07/16 0800 11/07/16 0900 11/07/16 1000  BP: 140/67 133/71 135/66 (!) 145/66  Pulse: (!) 103 98 (!) 101 97  Resp: (!) 22  Temp:      TempSrc:      SpO2: 100% 100% 99% 95%  Weight:      Height:        Intake/Output Summary (Last 24 hours) at 11/07/16 1052 Last data filed at 11/07/16 1000  Gross per 24 hour  Intake             1810 ml  Output             2050 ml  Net             -240 ml   Filed Weights   11/05/16 0500 11/06/16 0430 11/07/16 0500  Weight: 86.8 kg  (191 lb 5.8 oz) 82 kg (180 lb 12.4 oz) 83 kg (182 lb 15.7 oz)    Examination:  General exam: Grunting. NAD. Chronically ill-appearing. Some whitish plaques on the hard palate. Respiratory system: Bibasilar crackles. Some coarse breath sounds. No wheezing. Respiratory effort normal. Cardiovascular system: S1 & S2 heard, RRR. No JVD, murmurs, rubs, gallops or clicks. No pedal edema. Gastrointestinal system: Abdomen is nondistended, soft and nontender. No organomegaly or masses felt. Normal bowel sounds heard. Central nervous system: Alert and oriented. No focal neurological deficits. Extremities: Venous stasis changes noted. Symmetric 5 x 5 power. Skin: No rashes, lesions or ulcers Psychiatry: Judgement and insight appear fair. Mood & affect appropriate.     Data Reviewed: I have personally reviewed following labs and imaging studies  CBC:  Recent Labs Lab 11/02/16 0316 11/02/16 1100 11/02/16 1745 11/03/16 0400 11/03/16 0916 11/04/16 0447 11/05/16 0552 11/06/16 0729  WBC 10.4 10.3 9.4 8.1 8.1 7.4 7.6 11.2*  NEUTROABS 7.9* 7.8* 7.5 6.2 6.3  --   --   --   HGB 10.8* 10.5* 10.3* 10.4* 10.7* 10.4* 10.0* 10.7*  HCT 34.8* 33.9* 33.7* 32.8* 34.3* 32.8* 32.1* 33.6*  MCV 97.8 99.1 98.8 97.9 98.3 96.2 97.0 96.3  PLT 131* 119* 117* 118* 138* 126* 122* 120*   Basic Metabolic Panel:  Recent Labs Lab 11/03/16 0400 11/04/16 0447 11/04/16 1753 11/05/16 0552 11/06/16 0729  NA 149* 148* 146* 147* 149*  K 2.8* 2.8* 3.6 3.4* 3.3*  CL 116* 115* 114* 115* 114*  CO2 GLUCOSE 99 126* 125* 139* 119*  BUN 28* 22* 22* 28* 27*  CREATININE 2.20* 1.78* 1.96* 1.98* 1.84*  CALCIUM 7.4* 7.6* 7.5* 7.5* 8.0*  MG  --  1.4* 2.2 2.1 1.8  PHOS  --   --  1.8*  --   --    GFR: Estimated Creatinine Clearance: 31.5 mL/min (A) (by C-G formula based on SCr of 1.84 mg/dL (H)). Liver Function Tests:  Recent Labs Lab 10/31/16 1921  AST 57*  ALT 27  ALKPHOS 195*  BILITOT 1.3*  PROT  7.1  ALBUMIN 3.2*   No results for input(s): LIPASE, AMYLASE in the last 168 hours. No results for input(s): AMMONIA in the last 168 hours. Coagulation Profile:  Recent Labs Lab 10/31/16 1921  INR 1.10   Cardiac Enzymes:  Recent Labs Lab 11/04/16 0447 11/04/16 1329 11/04/16 1753 11/05/16 1107 11/06/16 0729  TROPONINI 4.81* 3.33* 3.61* 2.01* 1.29*   BNP (last 3 results) No results for input(s): PROBNP in the last 8760 hours. HbA1C: No results for input(s): HGBA1C in the last 72 hours. CBG:  Recent Labs Lab 11/06/16 1608 11/06/16 1936 11/06/16 2353 11/07/16 0537 11/07/16 0848  GLUCAP 137* 125* 86 161* 133*   Lipid Profile: No results for input(s): CHOL, HDL, LDLCALC, TRIG, CHOLHDL, LDLDIRECT in the last 72 hours. Thyroid Function Tests: No results for input(s): TSH, T4TOTAL, FREET4, T3FREE, THYROIDAB in the last 72 hours. Anemia Panel: No results for input(s): VITAMINB12, FOLATE, FERRITIN, TIBC, IRON, RETICCTPCT in the last 72 hours. Sepsis Labs:  Recent Labs Lab 10/31/16 1936 10/31/16 2230  LATICACIDVEN 2.05* 4.11*    Recent Results (from the past 240 hour(s))  Culture, blood (routine x 2)     Status: None   Collection Time: 10/31/16 10:10 PM  Result Value Ref Range Status   Specimen Description BLOOD LEFT HAND  Final   Special Requests IN PEDIATRIC BOTTLE Blood Culture adequate volume  Final   Culture NO GROWTH 5 DAYS  Final   Report Status 11/05/2016 FINAL  Final  Culture, blood (routine x 2)     Status: None   Collection Time: 10/31/16 10:20 PM  Result Value Ref Range Status   Specimen Description BLOOD RIGHT HAND  Final   Special Requests IN PEDIATRIC BOTTLE Blood Culture adequate volume  Final   Culture NO GROWTH 5 DAYS  Final   Report Status 11/05/2016 FINAL  Final  Urine culture     Status: None   Collection Time: 11/01/16 12:30 AM  Result Value Ref Range Status   Specimen Description URINE, RANDOM  Final   Special Requests NONE  Final    Culture NO GROWTH  Final   Report Status 11/02/2016 FINAL  Final  MRSA PCR Screening     Status: None   Collection Time: 11/01/16  1:47 AM  Result Value Ref Range Status   MRSA by PCR NEGATIVE NEGATIVE Final    Comment:        The GeneXpert MRSA Assay (FDA approved for NASAL specimens only), is one component of a comprehensive MRSA colonization surveillance program. It is not intended to diagnose MRSA infection nor to guide or monitor treatment for MRSA infections.   Culture, respiratory (NON-Expectorated)     Status: None   Collection Time: 11/04/16 11:32 AM  Result Value Ref Range Status   Specimen Description TRACHEAL ASPIRATE  Final   Special Requests NONE  Final   Gram Stain   Final    MODERATE WBC PRESENT, PREDOMINANTLY PMN FEW SQUAMOUS EPITHELIAL CELLS PRESENT RARE YEAST  Culture FEW CANDIDA KRUSEI MODERATE CANDIDA ALBICANS   Final   Report Status 11/06/2016 FINAL  Final  Culture, Urine     Status: None   Collection Time: 11/04/16 12:00 PM  Result Value Ref Range Status   Specimen Description URINE, CATHETERIZED  Final   Special Requests NONE  Final   Culture NO GROWTH  Final   Report Status 11/05/2016 FINAL  Final  Culture, blood (routine x 2)     Status: None (Preliminary result)   Collection Time: 11/04/16  1:30 PM  Result Value Ref Range Status   Specimen Description BLOOD LEFT HAND  Final   Special Requests   Final    BOTTLES DRAWN AEROBIC ONLY Blood Culture adequate volume   Culture NO GROWTH 2 DAYS  Final   Report Status PENDING  Incomplete  Culture, blood (routine x 2)     Status: Abnormal   Collection Time: 11/04/16  1:36 PM  Result Value Ref Range Status   Specimen Description BLOOD LEFT ARM  Final   Special Requests IN PEDIATRIC BOTTLE Blood Culture adequate volume  Final   Culture  Setup Time   Final    GRAM POSITIVE COCCI IN CLUSTERS IN PEDIATRIC BOTTLE CRITICAL RESULT CALLED TO, READ BACK BY AND VERIFIED WITH: A JOHNSTON 11/05/16 @ 0919 M  VESTAL    Culture (A)  Final    STAPHYLOCOCCUS SPECIES (COAGULASE NEGATIVE) THE SIGNIFICANCE OF ISOLATING THIS ORGANISM FROM A SINGLE SET OF BLOOD CULTURES WHEN MULTIPLE SETS ARE DRAWN IS UNCERTAIN. PLEASE NOTIFY THE MICROBIOLOGY DEPARTMENT WITHIN ONE WEEK IF SPECIATION AND SENSITIVITIES ARE REQUIRED.    Report Status 11/06/2016 FINAL  Final  Blood Culture ID Panel (Reflexed)     Status: Abnormal   Collection Time: 11/04/16  1:36 PM  Result Value Ref Range Status   Enterococcus species NOT DETECTED NOT DETECTED Final   Listeria monocytogenes NOT DETECTED NOT DETECTED Final   Staphylococcus species DETECTED (A) NOT DETECTED Final    Comment: CRITICAL RESULT CALLED TO, READ BACK BY AND VERIFIED WITH: A JOHNSTON 11/05/16 @ 0919 M VESTAL    Staphylococcus aureus NOT DETECTED NOT DETECTED Final   Methicillin resistance DETECTED (A) NOT DETECTED Final    Comment: CRITICAL RESULT CALLED TO, READ BACK BY AND VERIFIED WITH: A JOHNSTON 11/05/16 @ 0919 M VESTAL    Streptococcus species NOT DETECTED NOT DETECTED Final   Streptococcus agalactiae NOT DETECTED NOT DETECTED Final   Streptococcus pneumoniae NOT DETECTED NOT DETECTED Final   Streptococcus pyogenes NOT DETECTED NOT DETECTED Final   Acinetobacter baumannii NOT DETECTED NOT DETECTED Final   Enterobacteriaceae species NOT DETECTED NOT DETECTED Final   Enterobacter cloacae complex NOT DETECTED NOT DETECTED Final   Escherichia coli NOT DETECTED NOT DETECTED Final   Klebsiella oxytoca NOT DETECTED NOT DETECTED Final   Klebsiella pneumoniae NOT DETECTED NOT DETECTED Final   Proteus species NOT DETECTED NOT DETECTED Final   Serratia marcescens NOT DETECTED NOT DETECTED Final   Haemophilus influenzae NOT DETECTED NOT DETECTED Final   Neisseria meningitidis NOT DETECTED NOT DETECTED Final   Pseudomonas aeruginosa NOT DETECTED NOT DETECTED Final   Candida albicans NOT DETECTED NOT DETECTED Final   Candida glabrata NOT DETECTED NOT DETECTED Final     Candida krusei NOT DETECTED NOT DETECTED Final   Candida parapsilosis NOT DETECTED NOT DETECTED Final   Candida tropicalis NOT DETECTED NOT DETECTED Final         Radiology Studies: Dg Chest Port 1 View  Result Date:  11/06/2016 CLINICAL DATA:  Respiratory failure, previous CABG. EXAM: PORTABLE CHEST 1 VIEW COMPARISON:  Portable chest x-ray of November 05, 2016 FINDINGS: The trachea has been extubated. The lungs remain adequately inflated. There is increased interstitial density throughout the right lung and in the left perihilar region more conspicuous today. The retrocardiac region on the left remains dense. There is no pneumothorax nor large pleural effusion. The patient has undergone previous CABG. The cardiac silhouette is mildly enlarged. The pulmonary vascularity is engorged and indistinct. There is calcification in the wall of the aortic arch. The sternal wires are intact. The feeding tube tip projects below the inferior margin of the image. IMPRESSION: Interval increase in interstitial density throughout the right lung and in the left perihilar region most compatible with CHF. Interstitial pneumonia is felt less likely. Persistent left basilar atelectasis or pneumonia. Thoracic aortic atherosclerosis. Electronically Signed   By: David  [place] M.D.   On: 11/06/2016 07:14        Scheduled Meds: . amantadine  100 mg Oral TID  . aspirin  81 mg Oral Daily  . atorvastatin  10 mg Oral QHS  . carbidopa-levodopa  1 tablet Oral TID  . feeding supplement (PRO-STAT SUGAR FREE 64)  30 mL Per Tube Daily  . free water  200 mL Per Tube Q8H  . furosemide  40 mg Per Tube Daily  . Gerhardt's butt cream   Topical QID  . heparin subcutaneous  5,000 Units Subcutaneous Q8H  . insulin aspart  0-9 Units Subcutaneous Q4H  . mouth rinse  15 mL Mouth Rinse BID  . pantoprazole sodium  40 mg Per Tube Daily  . pneumococcal 23 valent vaccine  0.5 mL Intramuscular Tomorrow-1000  . primidone  50 mg Oral  Q12H  . rOPINIRole  2 mg Oral Q12H   Continuous Infusions: . feeding supplement (JEVITY 1.2 CAL) 1,000 mL (11/07/16 0228)  . [START ON 11/08/2016] fluconazole (DIFLUCAN) IV    . fluconazole (DIFLUCAN) IV 200 mg (11/07/16 1000)  . piperacillin-tazobactam (ZOSYN)  IV Stopped (11/07/16 1002)     LOS: 7 days    Time spent: 40 minutes    THOMPSON,DANIEL, MD [facility] Pager [phone]  If 7PM-7AM, please contact night-coverage [email or web address] Password [email or web address] 11/07/2016, 10:52 AM

## 2016-11-07 NOTE — Progress Notes (Signed)
ANTICOAGULATION CONSULT NOTE - Initial Consult  Pharmacy Consult for heparin Indication: chest pain/ACS  No Known Allergies  Patient Measurements: Height: 5\' 9"  (175.3 cm) Weight: 182 lb 15.7 oz (83 kg) IBW/kg (Calculated) : 70.7 Heparin Dosing Weight: 83 kg  Vital Signs: Temp: 100.8 F (38.2 C) (09/07 1510) Temp Source: Oral (09/07 1510) BP: 132/71 (09/07 1812) Pulse Rate: 39 (09/07 1812)  Labs:  Recent Labs  11/05/16 0552 11/05/16 1107 11/06/16 0729  HGB 10.0*  --  10.7*  HCT 32.1*  --  33.6*  PLT 122*  --  120*  CREATININE 1.98*  --  1.84*  TROPONINI  --  2.01* 1.29*    Estimated Creatinine Clearance: 31.5 mL/min (A) (by C-G formula based on SCr of 1.84 mg/dL (H)).   Medical History: Past Medical History:  Diagnosis Date  . Coronary artery disease    CABG  . Essential hypertension   . Hyperlipidemia   . Parkinson's disease (HCC)   . Restless leg syndrome     Medications:  Prescriptions Prior to Admission  Medication Sig Dispense Refill Last Dose  . amantadine (SYMMETREL) 100 MG capsule Take 100 mg by mouth 3 (three) times daily.   10/31/2016 at Unknown time  . aspirin EC 81 MG tablet Take 81 mg by mouth daily.   10/31/2016 at 0900  . atorvastatin (LIPITOR) 10 MG tablet Take 10 mg by mouth at bedtime.   10/30/2016 at pm  . carbidopa-levodopa (SINEMET IR) 25-100 MG tablet Take 1 tablet by mouth 2 (two) times daily.   10/31/2016 at am  . carbidopa-levodopa-entacapone (STALEVO) 50-200-200 MG tablet Take 1 tablet by mouth 2 (two) times daily.   10/31/2016 at 1500  . furosemide (LASIX) 40 MG tablet Take 80 mg by mouth daily.   10/31/2016 at Unknown time  . gabapentin (NEURONTIN) 600 MG tablet Take 600 mg by mouth 4 (four) times daily.   10/31/2016 at 1200  . metolazone (ZAROXOLYN) 5 MG tablet Take 5 mg by mouth every Monday, Wednesday, and Friday.   10/31/2016 at Unknown time  . Multiple Vitamins-Minerals (CENTRUM SILVER 50+MEN) TABS Take 1 tablet by mouth daily.    10/31/2016 at Unknown time  . omeprazole (PRILOSEC) 40 MG capsule Take 40 mg by mouth daily.   10/31/2016 at Unknown time  . potassium chloride SA (K-DUR,KLOR-CON) 20 MEQ tablet Take 20-40 mEq by mouth See admin instructions. 40 mEq in the morning and 20 mEq in the afternoon   10/31/2016 at Unknown time  . primidone (MYSOLINE) 50 MG tablet Take 50 mg by mouth 2 (two) times daily.   10/31/2016 at Unknown time  . propranolol (INDERAL) 60 MG tablet Take 60 mg by mouth 2 (two) times daily.   10/31/2016 at Unknown time  . rOPINIRole (REQUIP) 4 MG tablet Take 4 mg by mouth 2 (two) times daily.   10/31/2016 at am  . traMADol (ULTRAM) 50 MG tablet Take 50 mg by mouth 3 (three) times daily.   10/31/2016 at Unknown time  . trimethobenzamide (TIGAN) 300 MG capsule Take 300 mg by mouth 3 (three) times daily as needed for nausea/vomiting.   PRN at PRN  . valsartan (DIOVAN) 160 MG tablet Take 160 mg by mouth 2 (two) times daily.   10/31/2016 at Unknown time    Assessment: 83yoM with new EKG changes. Pharmacy consulted to start heparin for ACS.  Not on anticoagulation prior to admission. Last dose of subcutaneous heparin was at 1304 today. CBC stable.  Goal of Therapy:  Heparin level 0.3-0.7 units/ml Monitor platelets by anticoagulation protocol: Yes   Plan:  Give 2000 units bolus x 1 Start heparin infusion at 1000 units/hr Check anti-Xa level in 8 hours and daily while on heparin Continue to monitor H&H and platelets  Thank you for allowing [place] to participate in this patients care.  Signe Colt, PharmD Clinical phone for 11/07/2016 from 3:30-10:30p: [phone] If after 10:30p, please call main pharmacy at: [phone] 11/07/2016 6:40 PM

## 2016-11-07 NOTE — Care Management Note (Addendum)
Case Management Note  Patient Details  Name: Jack Carter MRN: [id number] Date of Birth: 11/09/1933  Subjective/Objective:  Pt admitted post fall and losing consciousness - transferred from floor in resp distress and now intubated on ICU unit                 Action/Plan:  PTA from home with daughter - [place] recently moved from [place].  Pt will benefit from PT eval post extubation.  CM will continue to follow for discharge needs   Expected Discharge Date:                  Expected Discharge Plan:     In-House Referral:     Discharge planning Services  CM Consult  Post Acute Care Choice:    Choice offered to:     DME Arranged:    DME Agency:     HH Arranged:    HH Agency:     Status of Service:     If discussed at [employer] Meetings, dates discussed:    Additional Comments: 11/07/2016 CM spoke with daughter - Pt remains confused and calling out incoherent however per daughter pt was A/O PTA/  Pt recently moved from [place] in July.  Per daughter VA benefit is tertiary and not primary - daughter [facility] int[facility]ed [facility] Daughter informed [facility] of pts admit.   Per daughter - [facility] [place].  Pt does however get his prescription [facility] through [facility] [place] ([facility] needs to the [facility] directly).    CM will continue to follow for discharge needs Cherylann ParrClaxton, Maisee Vollman S, RN 11/07/2016, 12:13 PM

## 2016-11-07 NOTE — Progress Notes (Addendum)
eLink Physician-Brief Progress Note Patient Name: Jack Carter DOB: 11/10/1933 MRN: [id number]   Date of Service  11/07/2016  HPI/Events of Note  Hypoxemia - Patient became acute SOB during transfer from one bed to another. Patient has history of flash pulmonary edema. I suspect that he may be having another episode of flash pulmonary edema.   eICU Interventions  Will order: 1. 12 Lead EKG STAT. 2. Lasix 40 mg IV now.  3. BIPAP. 4. Portable CXR STAT.  5. Cycle Troponin. 6. Cancel transfer out of ICU.  7. NTG 0.4 mg SL now.      Intervention Category Major Interventions: Hypoxemia - evaluation and management  Sommer,Steven Eugene 11/07/2016, 5:58 PM

## 2016-11-07 NOTE — Progress Notes (Signed)
Progress Note  Patient Name: Jack Carter Date of Encounter: 11/07/2016  Primary Cardiologist: Mendel RyderH. Jeshua Ransford  Subjective   He is moaning and uncomfortable. Says he is very thirsty and wants water.No chest pain and says he is breathing okay.  Inpatient Medications    Scheduled Meds: . amantadine  100 mg Oral TID  . aspirin  81 mg Oral Daily  . atorvastatin  10 mg Oral QHS  . carbidopa-levodopa  1 tablet Oral TID  . feeding supplement (PRO-STAT SUGAR FREE 64)  30 mL Per Tube Daily  . free water  200 mL Per Tube Q8H  . furosemide  40 mg Per Tube Daily  . Gerhardt's butt cream   Topical QID  . heparin subcutaneous  5,000 Units Subcutaneous Q8H  . insulin aspart  0-9 Units Subcutaneous Q4H  . mouth rinse  15 mL Mouth Rinse BID  . pantoprazole sodium  40 mg Per Tube Daily  . pneumococcal 23 valent vaccine  0.5 mL Intramuscular Tomorrow-1000  . primidone  50 mg Oral Q12H  . rOPINIRole  2 mg Oral Q12H   Continuous Infusions: . feeding supplement (JEVITY 1.2 CAL) 1,000 mL (11/07/16 0228)  . [START ON 11/08/2016] fluconazole (DIFLUCAN) IV    . piperacillin-tazobactam (ZOSYN)  IV Stopped (11/07/16 1002)   PRN Meds: albuterol, bisacodyl, sodium chloride flush, traMADol   Vital Signs    Vitals:   11/07/16 0700 11/07/16 0800 11/07/16 0900 11/07/16 1000  BP: 140/67 133/71 135/66 (!) 145/66  Pulse: (!) 103 98 (!) 101 97  Resp: 18 15 17  (!) 22  Temp:      TempSrc:      SpO2: 100% 100% 99% 95%  Weight:      Height:        Intake/Output Summary (Last 24 hours) at 11/07/16 1100 Last data filed at 11/07/16 1000  Gross per 24 hour  Intake             1740 ml  Output             2050 ml  Net             -310 ml   Filed Weights   11/05/16 0500 11/06/16 0430 11/07/16 0500  Weight: 191 lb 5.8 oz (86.8 kg) 180 lb 12.4 oz (82 kg) 182 lb 15.7 oz (83 kg)    Telemetry    NSR and ST - Personally Reviewed  ECG    No new - Personally Reviewed  Physical Exam  Elderly and frail  and somewhat aggitated. GEN: No acute distress.   Neck: No JVD. Cardiac: RRR, no murmurs, rubs, or gallops.  Respiratory: Clear to auscultation bilaterally. GI: Soft, nontender, non-distended  MS: No edema; No deformity. Neuro:  Nonfocal  Psych: Normal affect   Labs    Chemistry Recent Labs Lab 10/31/16 1921  11/04/16 1753 11/05/16 0552 11/06/16 0729  NA 134*  < > 146* 147* 149*  K >7.5*  < > 3.6 3.4* 3.3*  CL 107  < > 114* 115* 114*  CO2 20*  < > 24 25 23   GLUCOSE 160*  < > 125* 139* 119*  BUN 52*  < > 22* 28* 27*  CREATININE 2.99*  < > 1.96* 1.98* 1.84*  CALCIUM 8.5*  < > 7.5* 7.5* 8.0*  PROT 7.1  --   --   --   --   ALBUMIN 3.2*  --   --   --   --   AST  57*  --   --   --   --   ALT 27  --   --   --   --   ALKPHOS 195*  --   --   --   --   BILITOT 1.3*  --   --   --   --   GFRNONAA 18*  < > 30* 30* 33*  GFRAA 21*  < > 35* 35* 38*  ANIONGAP 7  < > < > = values in this interval not displayed.   Hematology Recent Labs Lab 11/04/16 0447 11/05/16 0552 11/06/16 0729  WBC 7.4 7.6 11.2*  RBC 3.41* 3.31* 3.49*  HGB 10.4* 10.0* 10.7*  HCT 32.8* 32.1* 33.6*  MCV 96.2 97.0 96.3  MCH 30.5 30.2 30.7  MCHC 31.7 31.2 31.8  RDW 16.0* 16.2* 16.4*  PLT 126* 122* 120*    Cardiac Enzymes Recent Labs Lab 11/04/16 1329 11/04/16 1753 11/05/16 1107 11/06/16 0729  TROPONINI 3.33* 3.61* 2.01* 1.29*    Recent Labs Lab 10/31/16 1952  TROPIPOC 0.11*     BNP Recent Labs Lab 10/31/16 2202 11/06/16 0729  BNP 205.9* 813.0*     DDimer No results for input(s): DDIMER in the last 168 hours.   Radiology    Dg Chest Port 1 View  Result Date: 11/06/2016 CLINICAL DATA:  Respiratory failure, previous CABG. EXAM: PORTABLE CHEST 1 VIEW COMPARISON:  Portable chest x-ray of November 05, 2016 FINDINGS: The trachea has been extubated. The lungs remain adequately inflated. There is increased interstitial density throughout the right lung and in the left perihilar region  more conspicuous today. The retrocardiac region on the left remains dense. There is no pneumothorax nor large pleural effusion. The patient has undergone previous CABG. The cardiac silhouette is mildly enlarged. The pulmonary vascularity is engorged and indistinct. There is calcification in the wall of the aortic arch. The sternal wires are intact. The feeding tube tip projects below the inferior margin of the image. IMPRESSION: Interval increase in interstitial density throughout the right lung and in the left perihilar region most compatible with CHF. Interstitial pneumonia is felt less likely. Persistent left basilar atelectasis or pneumonia. Thoracic aortic atherosclerosis. Electronically Signed   By: David  [place] M.D.   On: 11/06/2016 07:14    Cardiac Studies   ECHO 11/03/16: Study Conclusions  - Left ventricle: The cavity size was normal. Systolic function was   mildly to moderately reduced. The estimated ejection fraction was   in the range of 40% to 45%. Features are consistent with a   pseudonormal left ventricular filling pattern, with concomitant   abnormal relaxation and increased filling pressure (grade 2   diastolic dysfunction). - Ventricular septum: The contour showed diastolic flattening and   systolic flattening. These changes are consistent with RV volume   and pressure overload. - Aortic valve: There was trivial regurgitation. - Mitral valve: There was mild to moderate regurgitation directed   centrally. - Left atrium: The atrium was moderately dilated. - Right ventricle: The cavity size was moderately dilated. Wall   thickness was normal. Systolic function was moderately reduced. - Right atrium: The atrium was moderately dilated. - Atrial septum: No defect or patent foramen ovale was identified. - Tricuspid valve: There was malcoaptation of the valve leaflets.   There was severe regurgitation directed centrally. - Pulmonary arteries: Systolic pressure was moderately  increased.   PA peak pressure: 60 mm Hg (S).   Patient Profile  83 y.o. male  with a hx of prior CABG and elevated cardiac markerswho was seen today for elevated troponinand suddenly developed respiratory distress and required intubation with transfer to ICU on 11/04/16. Felt decompensation due to aspiration and ? CHF.  Assessment & Plan    1. Chronic combined systolic and diastolic heart failure. Difficult to assess volume status. Recommend daily oral lasix at least 40 mg and note he was on 80 mg as an OP and metolazone  M, W, and F..Will eventually need at least 80 mg +/- metolazone. 2. Coronary artery disease with prior CABG: need to get prior cath and cardiac data [facility]. Will need to obtain names from daughter. Would continue beta blocker, Statin, and consider LA nitrate. 3. Non-ST elevation myocardial infarction: suggest conservative medical therapy until stronger and prior cardiac data reviewed.  Signed, Lesleigh Noe, MD  11/07/2016, 11:00 AM

## 2016-11-08 ENCOUNTER — Inpatient Hospital Stay (HOSPITAL_COMMUNITY): Payer: Medicare Other

## 2016-11-08 DIAGNOSIS — I5084 End stage heart failure: Secondary | ICD-10-CM

## 2016-11-08 DIAGNOSIS — M79676 Pain in unspecified toe(s): Secondary | ICD-10-CM

## 2016-11-08 LAB — BASIC METABOLIC PANEL WITH GFR
Anion gap: 15 (ref 5–15)
BUN: 35 mg/dL — ABNORMAL HIGH (ref 6–20)
CO2: 26 mmol/L (ref 22–32)
Calcium: 7.9 mg/dL — ABNORMAL LOW (ref 8.9–10.3)
Chloride: 114 mmol/L — ABNORMAL HIGH (ref 101–111)
Creatinine, Ser: 2.02 mg/dL — ABNORMAL HIGH (ref 0.61–1.24)
GFR calc Af Amer: 34 mL/min — ABNORMAL LOW
GFR calc non Af Amer: 29 mL/min — ABNORMAL LOW
Glucose, Bld: 92 mg/dL (ref 65–99)
Potassium: 3.2 mmol/L — ABNORMAL LOW (ref 3.5–5.1)
Sodium: 155 mmol/L — ABNORMAL HIGH (ref 135–145)

## 2016-11-08 LAB — CBC WITH DIFFERENTIAL/PLATELET
Basophils Absolute: 0.1 K/uL (ref 0.0–0.1)
Basophils Relative: 1 %
Eosinophils Absolute: 0.4 K/uL (ref 0.0–0.7)
Eosinophils Relative: 4 %
HCT: 33 % — ABNORMAL LOW (ref 39.0–52.0)
Hemoglobin: 10.5 g/dL — ABNORMAL LOW (ref 13.0–17.0)
Lymphocytes Relative: 14 %
Lymphs Abs: 1.4 K/uL (ref 0.7–4.0)
MCH: 31.1 pg (ref 26.0–34.0)
MCHC: 31.8 g/dL (ref 30.0–36.0)
MCV: 97.6 fL (ref 78.0–100.0)
Monocytes Absolute: 0.9 K/uL (ref 0.1–1.0)
Monocytes Relative: 9 %
Neutro Abs: 7.3 K/uL (ref 1.7–7.7)
Neutrophils Relative %: 72 %
Platelets: 150 K/uL (ref 150–400)
RBC: 3.38 MIL/uL — ABNORMAL LOW (ref 4.22–5.81)
RDW: 17 % — ABNORMAL HIGH (ref 11.5–15.5)
WBC: 10.1 K/uL (ref 4.0–10.5)

## 2016-11-08 LAB — GLUCOSE, CAPILLARY
Glucose-Capillary: 98 mg/dL (ref 65–99)
Glucose-Capillary: 112 mg/dL — ABNORMAL HIGH (ref 65–99)
Glucose-Capillary: 156 mg/dL — ABNORMAL HIGH (ref 65–99)
Glucose-Capillary: 144 mg/dL — ABNORMAL HIGH (ref 65–99)

## 2016-11-08 LAB — BASIC METABOLIC PANEL
ANION GAP: 11 (ref 5–15)
BUN: 39 mg/dL — ABNORMAL HIGH (ref 6–20)
CALCIUM: 7.8 mg/dL — AB (ref 8.9–10.3)
CO2: 28 mmol/L (ref 22–32)
CREATININE: 2.12 mg/dL — AB (ref 0.61–1.24)
Chloride: 113 mmol/L — ABNORMAL HIGH (ref 101–111)
GFR, EST AFRICAN AMERICAN: 32 mL/min — AB (ref >60)
GFR, EST NON AFRICAN AMERICAN: 28 mL/min — AB (ref >60)
Glucose, Bld: 129 mg/dL — ABNORMAL HIGH (ref 65–99)
Potassium: 2.9 mmol/L — ABNORMAL LOW (ref 3.5–5.1)
SODIUM: 152 mmol/L — AB (ref 135–145)

## 2016-11-08 LAB — HEPARIN LEVEL (UNFRACTIONATED)
Heparin Unfractionated: 0.25 IU/mL — ABNORMAL LOW (ref 0.30–0.70)
Heparin Unfractionated: 0.33 IU/mL (ref 0.30–0.70)

## 2016-11-08 LAB — BRAIN NATRIURETIC PEPTIDE: B Natriuretic Peptide: 657 pg/mL — ABNORMAL HIGH (ref 0.0–100.0)

## 2016-11-08 LAB — TROPONIN I
TROPONIN I: 1.04 ng/mL — AB (ref <0.03)
Troponin I: 1.1 ng/mL

## 2016-11-08 LAB — MAGNESIUM: Magnesium: 1.7 mg/dL (ref 1.7–2.4)

## 2016-11-08 LAB — URIC ACID: Uric Acid, Serum: 3.9 mg/dL — ABNORMAL LOW (ref 4.4–7.6)

## 2016-11-08 MED ORDER — POTASSIUM CHLORIDE 10 MEQ/100ML IV SOLN
10.0000 meq | INTRAVENOUS | Status: AC
  Administered 2016-11-08 – 2016-11-09 (×4): 10 meq via INTRAVENOUS
  Filled 2016-11-08 (×4): qty 100

## 2016-11-08 MED ORDER — DEXTROSE 5 % IV SOLN
INTRAVENOUS | Status: DC
Start: 2016-11-08 — End: 2016-11-08

## 2016-11-08 MED ORDER — FREE WATER: 250.0000 mL | Freq: Four times a day (QID) | Status: DC

## 2016-11-08 MED ORDER — FENTANYL CITRATE (PF) 100 MCG/2ML IJ SOLN
25.0000 ug | INTRAMUSCULAR | Status: DC | PRN
  Administered 2016-11-08: 25 ug via INTRAVENOUS
  Filled 2016-11-08: qty 2

## 2016-11-08 MED ORDER — FREE WATER
250.0000 mL | Status: DC
  Administered 2016-11-08 – 2016-11-09 (×6): 250 mL

## 2016-11-08 MED ORDER — WHITE PETROLATUM GEL
Status: AC
  Administered 2016-11-08: 1
  Filled 2016-11-08: qty 1

## 2016-11-08 NOTE — Progress Notes (Signed)
  Speech Language Pathology Treatment: Dysphagia  Patient Details Name: Jack Carter MRN: [id number] DOB: 03/02/1934 Today's Date: 11/08/2016 Time: [id number] SLP Time Calculation (min) (ACUTE ONLY): 55 min  Assessment / Plan / Recommendation Clinical Impression  Patient seen to address dysphagia goals and assess for readiness of PO diet versus objective swallow study. Patient allowed SLP to perform oral care as he wanted to have ice chips. SLP administered one ice chip at a time and patient would close eyes and let ice melt before swallowing. He exhibited delayed swallow initiation and delayed cough with ice chips, however severity of cough was mild. Patient also accepted spoonfuls of applesauce (puree), which he enjoyed and only exhibited a mild delay in oral transit and swallow initiation of. He did not exhibit any overt s/s of aspiration or penetration with puree solids. Based on patient's recent h/o intubation and respiratory distress last night, plan to proceed conservatively as far as allowing PO diet. Recommend patient be allowed ice chips after oral care with SLP or RN only, but continue with NPO status. Patient would benefit from objective swallow study, and [facility] (RN informed SLP that just moving him in bed and for transfers caused his respiratory rate to increase), FEES (he had a FEES on 9/3) seems to be the best option at this time. FEES would also allow SLP to observe him over a longer period of time with PO's.     HPI HPI: 83 yr old male with Hx of parkinson disease, CAD s/p CABG, CHF who presented with 1 week history of weakness and fall at home, was unresponsive and required intubation. Admitted with septic and cardiogenic shock secondary to cellulitis and hyperkalemia. Severe hyperkalemia from AKI, use of K and bactrim. Severe bradycardia from hyperkalemia. Acute hypoxemic respiratory failure. Severe metabolic  encephalopathy. Intubated 8/31-9/2. He was assessed by SLP after extubation wtih FEES recommending NPO status. He was then reintubated 9/4-9/5.      SLP Plan  Continue with current plan of care       Recommendations  Diet recommendations: NPO;Other(comment) (ice chips after oral care with SLP or RN only) Medication Administration: Via alternative means                Oral Care Recommendations: Oral care QID SLP Visit Diagnosis: Dysphagia, oropharyngeal phase (R13.12) Plan: Continue with current plan of care       GO                Angela NevinJohn T. Preston, MA, CCC-SLP 11/08/16 5:15 PM

## 2016-11-08 NOTE — Progress Notes (Signed)
PROGRESS NOTE    Jack Carter  GNF:[id number] DOB: 1933-12-13 DOA: 10/31/2016 PCP: No primary care provider on file.    Brief Narrative: Patient is a 83 year old gentleman who was admitted with generalized weakness with septic shock with cellulitis. It was noted during the hospitalization to have cardiogenic shock with bradycardia hypotension hyperkalemia from Bactrim. Patient required reintubation and successfully extubated and transferred out the ICU. Patient return back to the ICU on 11/04/2016 with respiratory failure likely secondary to aspiration of acute pulmonary edema. Patient was on the critical care service. Patient subsequently extubated and transferred to the hospitalist service. Cardiology following.   Assessment & Plan:   Principal Problem:   Cardiogenic shock (HCC) Active Problems:   Septic shock (HCC)   Cellulitis   Hyperkalemia   AKI (acute kidney injury) (HCC)   Acute hypoxemic respiratory failure (HCC)   CAD (coronary artery disease) of artery bypass graft   CHF (congestive heart failure) (HCC)   Parkinson disease (HCC)   Upper GI bleed   Elevated troponin   Acute encephalopathy   Goals of care, counseling/discussion   Aspiration into airway   Hypernatremia   Great toe pain, unspecified laterality  #1 acute hypoxia respiratory failure secondary to aspiration and pulmonary edema/CHF exacerbation Patient extubated as speaking in full sentences. Chest x-ray from 11/06/2016 consistent with volume overload. Tracheal aspirate positive for Candida species. It urine pneumococcus antigen negative. Urine Legionella antigen negative. discontinued on 11/06/2016. IV Diflucan 200 mg 1, then 100 mg daily. Oral Lasix per tube has been resumed. Patient noted to go in acute respiratory distress with sats in the 50s yesterday evening and noted to likely be in flash pulmonary edema. Patient given a dose of IV Lasix with a urine output of 1.7 L after IV Lasix. Patient now  significantly hypernatremic with a sodium level of 155. Increase free water flushes. Discontinue Lasix for now. Strict I's and O's. Daily weights. Repeat chest x-ray. Cardiology following.  #2 acute on chronic combined systolic and diastolic heart failure Likely secondary to cardiogenic shock and NSTEMI. 2-D echo with EF of 40-45%, grade 2 diastolic dysfunction, changes noted consistent with right ventricular volume and pressure overload. Patient noted to go in acute respiratory distress overnight with sats in the 50s and noted to be in flash pulmonary edema. Patient given a dose of IV Lasix and placed on the BiPAP. Patient now on 6 L nasal cannula. Patient with a urine output of 3.050 L over the past 24 hours. Patient still volume overloaded clinically on examination. Patient with worsening hypernatremia and as such will discontinue Lasix for today. Cardiology following. Strict I's and O's. Daily weights.  #3 non-STEMI/cardiogenic shock/coronary artery disease Questionable etiology. 2-D echo with a EF of 40-45%, grade 2 diastolic dysfunction. Changes noted consistent with right ventricular volume and pressure overload. Patient status post CABG. Patient with elevated troponins consistent with non-STEMI. Patient currently chest pain-free. Per cardiology ischemic evaluation with coronary angiography should be delayed until patient is much improved and kidney function is stable.  Patient noted to go into flash pulmonary edema. Cardiac enzymes cycled elevated. Patient given a dose of IV Lasix. BNP elevated. Continue aspirin, Lipitor, Lasix. Per cardiology.  #4 hyperkalemia Likely secondary to ongoing potassium supplementation and the presence of Diovan/Bactrim in the setting of acute kidney injury. Patient was seen by nephrology were recommended medical management. Patient now hypokalemic with a potassium of 3.3. Follow.  #4 hypokalemia Likely secondary to diuresis. Replete.  #5 dysphagia Patient  receiving  Jevity tube feeds. Speech therapy following.  #6 hypernatremia Worsening hypernatremia. Sodium levels now at 155. Patient an acute CHF exacerbation and was being diuresed. Will increase free water to 250 mg per tube every 4 hours. Discontinue Lasix. Follow.   #7 cellulitis Improved. Continue Zosyn.  #8 acute metabolic encephalopathy Patient with history of Parkinson's disease, restless leg syndrome. Likely close to baseline. Continue amantadine, Sinemet, Requip.  #9 anemia of chronic disease Patient with no overt bleeding. H&H stable.  #10 acute kidney injury Per nephrology hemodynamically mediated with possible ATN secondary to shock versus secondary to Bactrim. Baseline creatinine unknown. Renal ultrasound negative for any hydro-nephrosis. Follow.  #11 bilateral great toe pain Check a uric acid level. If elevated we'll place on a short burst of prednisone.   DVT prophylaxis: Heparin Code Status: Full Family Communication: Updated patient. No family at bedside. Disposition Plan: Remain in ICU. Disposition pending hospital course and PT evaluation.   Consultants:   Pccm: Dr. Molli Knock 11/04/2016/Dr. Sood 11/05/2016  Nephrology: Dr. Allena Katz 10/31/2016  Cardiology: Dr. Katrinka Blazing 11/04/2016  Procedures:   CT head without contrast 11/01/2016  Chest x-ray 10/31/2016, 11/04/2016, 11/05/2016, 11/06/2016, 11/07/2016  Renal ultrasound 10/31/2016  2-D echo 11/03/2016--- EF 40-45% with grade 2 diastolic dysfunction. Right ventricular volume pressure overload. Mild to moderate mitral valvular regurgitation. Moderately dilated left HM. Moderately dilated) sugar. Right ventricular systolic function moderately reduced. Right atrium moderately dilated.  Abdominal films 11/08/2016  Antimicrobials:   Zosyn  8/31>>9/3 Unasyn 9/3>>9/4 Vanc 8/31>>8/31 Vanc 9/4>>9/06 Zosyn 9/4>> IV Diflucan 11/07/2016  SIGNIFICANT EVENTS: Intubated 8/31>> 9/2 Re-intubated 9/4>>  9/05  LINES/TUBES: femoral CVC >> 906  CULTURES: Blood 8/31 >> negative Urine 9/01 >> negative Blood 9/4>> GPC in clusters >>  Urine Strep 9/4>> Negative Urine Culture 9/4>> negative Urine Legionella 9/4>> negative Sputum Culture/tracheal aspirate 9/4>> few Candida Krusei, moderate candida out the cans   Subjective: Patient laying in bed been assessed by the speech therapist. Patient also states liters nasal cannula. Events overnight noted as patient went into flash pulmonary edema. Patient denies any current chest pain. Patient denied any shortness of breath.  Objective: Vitals:   11/08/16 0800 11/08/16 0900 11/08/16 1000 11/08/16 1100  BP: (!) 117/59 122/61 118/62 118/62  Pulse: 84 85 88 88  Resp: 17 (!) 21 (!) 21 17  Temp:      TempSrc:      SpO2: 98% 97% 97% 98%  Weight:      Height:        Intake/Output Summary (Last 24 hours) at 11/08/16 1117 Last data filed at 11/08/16 1100  Gross per 24 hour  Intake           783.53 ml  Output             3825 ml  Net         -3041.47 ml   Filed Weights   11/06/16 0430 11/07/16 0500 11/08/16 0324  Weight: 82 kg (180 lb 12.4 oz) 83 kg (182 lb 15.7 oz) 80.6 kg (177 lb 11.1 oz)    Examination:  General exam: Chronically ill-appearing. Some whitish plaques on the hard palate. Respiratory system: Some diffuse scattered crackles. No wheezing. Respiratory effort normal. Cardiovascular system: S1 & S2 heard, RRR. No JVD, murmurs, rubs, gallops or clicks. No pedal edema. Gastrointestinal system: Abdomen is nondistended, soft and nontender. No organomegaly or masses felt. Normal bowel sounds heard. Central nervous system: Alert and oriented. No focal neurological deficits. Extremities: Venous stasis changes noted. Symmetric 5 x  5 power. Bilateral great toe pain and foot pain. Skin: No rashes, lesions or ulcers Psychiatry: Judgement and insight appear fair. Mood & affect appropriate.     Data Reviewed: I have personally reviewed  following labs and imaging studies  CBC:  Recent Labs Lab 11/02/16 1100 11/02/16 1745 11/03/16 0400 11/03/16 0916 11/04/16 0447 11/05/16 0552 11/06/16 0729 11/08/16 0740  WBC 10.3 9.4 8.1 8.1 7.4 7.6 11.2* 10.1  NEUTROABS 7.8* 7.5 6.2 6.3  --   --   --  7.3  HGB 10.5* 10.3* 10.4* 10.7* 10.4* 10.0* 10.7* 10.5*  HCT 33.9* 33.7* 32.8* 34.3* 32.8* 32.1* 33.6* 33.0*  MCV 99.1 98.8 97.9 98.3 96.2 97.0 96.3 97.6  PLT 119* 117* 118* 138* 126* 122* 120* 150   Basic Metabolic Panel:  Recent Labs Lab 11/04/16 0447 11/04/16 1753 11/05/16 0552 11/06/16 0729 11/07/16 1958 11/08/16 0740  NA 148* 146* 147* 149* 155* 155*  K 2.8* 3.6 3.4* 3.3* 3.5 3.2*  CL 115* 114* 115* 114* 121* 114*  CO2 GLUCOSE 126* 125* 139* 119* 131* 92  BUN 22* 22* 28* 27* 33* 35*  CREATININE 1.78* 1.96* 1.98* 1.84* 1.81* 2.02*  CALCIUM 7.6* 7.5* 7.5* 8.0* 7.3* 7.9*  MG 1.4* 2.2 2.1 1.8  --  1.7  PHOS  --  1.8*  --   --   --   --    GFR: Estimated Creatinine Clearance: 28.7 mL/min (A) (by C-G formula based on SCr of 2.02 mg/dL (H)). Liver Function Tests: No results for input(s): AST, ALT, ALKPHOS, BILITOT, PROT, ALBUMIN in the last 168 hours. No results for input(s): LIPASE, AMYLASE in the last 168 hours. No results for input(s): AMMONIA in the last 168 hours. Coagulation Profile: No results for input(s): INR, PROTIME in the last 168 hours. Cardiac Enzymes:  Recent Labs Lab 11/05/16 1107 11/06/16 0729 11/07/16 1814 11/07/16 2327 11/08/16 0740  TROPONINI 2.01* 1.29* 0.82* 1.04* 1.10*   BNP (last 3 results) No results for input(s): PROBNP in the last 8760 hours. HbA1C: No results for input(s): HGBA1C in the last 72 hours. CBG:  Recent Labs Lab 11/07/16 1218 11/07/16 1509 11/07/16 1924 11/07/16 2306 11/08/16 0724  GLUCAP 121* 136* 102* 105* 98   Lipid Profile: No results for input(s): CHOL, HDL, LDLCALC, TRIG, CHOLHDL, LDLDIRECT in the last 72 hours. Thyroid Function  Tests: No results for input(s): TSH, T4TOTAL, FREET4, T3FREE, THYROIDAB in the last 72 hours. Anemia Panel: No results for input(s): VITAMINB12, FOLATE, FERRITIN, TIBC, IRON, RETICCTPCT in the last 72 hours. Sepsis Labs: No results for input(s): PROCALCITON, LATICACIDVEN in the last 168 hours.  Recent Results (from the past 240 hour(s))  Culture, blood (routine x 2)     Status: None   Collection Time: 10/31/16 10:10 PM  Result Value Ref Range Status   Specimen Description BLOOD LEFT HAND  Final   Special Requests IN PEDIATRIC BOTTLE Blood Culture adequate volume  Final   Culture NO GROWTH 5 DAYS  Final   Report Status 11/05/2016 FINAL  Final  Culture, blood (routine x 2)     Status: None   Collection Time: 10/31/16 10:20 PM  Result Value Ref Range Status   Specimen Description BLOOD RIGHT HAND  Final   Special Requests IN PEDIATRIC BOTTLE Blood Culture adequate volume  Final   Culture NO GROWTH 5 DAYS  Final   Report Status 11/05/2016 FINAL  Final  Urine culture     Status: None   Collection  Time: 11/01/16 12:30 AM  Result Value Ref Range Status   Specimen Description URINE, RANDOM  Final   Special Requests NONE  Final   Culture NO GROWTH  Final   Report Status 11/02/2016 FINAL  Final  MRSA PCR Screening     Status: None   Collection Time: 11/01/16  1:47 AM  Result Value Ref Range Status   MRSA by PCR NEGATIVE NEGATIVE Final    Comment:        The GeneXpert MRSA Assay (FDA approved for NASAL specimens only), is one component of a comprehensive MRSA colonization surveillance program. It is not intended to diagnose MRSA infection nor to guide or monitor treatment for MRSA infections.   Culture, respiratory (NON-Expectorated)     Status: None   Collection Time: 11/04/16 11:32 AM  Result Value Ref Range Status   Specimen Description TRACHEAL ASPIRATE  Final   Special Requests NONE  Final   Gram Stain   Final    MODERATE WBC PRESENT, PREDOMINANTLY PMN FEW SQUAMOUS  EPITHELIAL CELLS PRESENT RARE YEAST    Culture FEW CANDIDA KRUSEI MODERATE CANDIDA ALBICANS   Final   Report Status 11/06/2016 FINAL  Final  Culture, Urine     Status: None   Collection Time: 11/04/16 12:00 PM  Result Value Ref Range Status   Specimen Description URINE, CATHETERIZED  Final   Special Requests NONE  Final   Culture NO GROWTH  Final   Report Status 11/05/2016 FINAL  Final  Culture, blood (routine x 2)     Status: None (Preliminary result)   Collection Time: 11/04/16  1:30 PM  Result Value Ref Range Status   Specimen Description BLOOD LEFT HAND  Final   Special Requests   Final    BOTTLES DRAWN AEROBIC ONLY Blood Culture adequate volume   Culture NO GROWTH 4 DAYS  Final   Report Status PENDING  Incomplete  Culture, blood (routine x 2)     Status: Abnormal   Collection Time: 11/04/16  1:36 PM  Result Value Ref Range Status   Specimen Description BLOOD LEFT ARM  Final   Special Requests IN PEDIATRIC BOTTLE Blood Culture adequate volume  Final   Culture  Setup Time   Final    GRAM POSITIVE COCCI IN CLUSTERS IN PEDIATRIC BOTTLE CRITICAL RESULT CALLED TO, READ BACK BY AND VERIFIED WITH: A JOHNSTON 11/05/16 @ 0919 M VESTAL    Culture (A)  Final    STAPHYLOCOCCUS SPECIES (COAGULASE NEGATIVE) THE SIGNIFICANCE OF ISOLATING THIS ORGANISM FROM A SINGLE SET OF BLOOD CULTURES WHEN MULTIPLE SETS ARE DRAWN IS UNCERTAIN. PLEASE NOTIFY THE MICROBIOLOGY DEPARTMENT WITHIN ONE WEEK IF SPECIATION AND SENSITIVITIES ARE REQUIRED.    Report Status 11/06/2016 FINAL  Final  Blood Culture ID Panel (Reflexed)     Status: Abnormal   Collection Time: 11/04/16  1:36 PM  Result Value Ref Range Status   Enterococcus species NOT DETECTED NOT DETECTED Final   Listeria monocytogenes NOT DETECTED NOT DETECTED Final   Staphylococcus species DETECTED (A) NOT DETECTED Final    Comment: CRITICAL RESULT CALLED TO, READ BACK BY AND VERIFIED WITH: A JOHNSTON 11/05/16 @ 0919 M VESTAL    Staphylococcus  aureus NOT DETECTED NOT DETECTED Final   Methicillin resistance DETECTED (A) NOT DETECTED Final    Comment: CRITICAL RESULT CALLED TO, READ BACK BY AND VERIFIED WITH: A JOHNSTON 11/05/16 @ 0919 M VESTAL    Streptococcus species NOT DETECTED NOT DETECTED Final   Streptococcus agalactiae NOT DETECTED NOT  DETECTED Final   Streptococcus pneumoniae NOT DETECTED NOT DETECTED Final   Streptococcus pyogenes NOT DETECTED NOT DETECTED Final   Acinetobacter baumannii NOT DETECTED NOT DETECTED Final   Enterobacteriaceae species NOT DETECTED NOT DETECTED Final   Enterobacter cloacae complex NOT DETECTED NOT DETECTED Final   Escherichia coli NOT DETECTED NOT DETECTED Final   Klebsiella oxytoca NOT DETECTED NOT DETECTED Final   Klebsiella pneumoniae NOT DETECTED NOT DETECTED Final   Proteus species NOT DETECTED NOT DETECTED Final   Serratia marcescens NOT DETECTED NOT DETECTED Final   Haemophilus influenzae NOT DETECTED NOT DETECTED Final   Neisseria meningitidis NOT DETECTED NOT DETECTED Final   Pseudomonas aeruginosa NOT DETECTED NOT DETECTED Final   Candida albicans NOT DETECTED NOT DETECTED Final   Candida glabrata NOT DETECTED NOT DETECTED Final   Candida krusei NOT DETECTED NOT DETECTED Final   Candida parapsilosis NOT DETECTED NOT DETECTED Final   Candida tropicalis NOT DETECTED NOT DETECTED Final         Radiology Studies: Dg Chest Port 1 View  Result Date: 11/07/2016 CLINICAL DATA:  Acute respiratory failure EXAM: PORTABLE CHEST 1 VIEW COMPARISON:  11/07/2016 FINDINGS: Post sternotomy changes. Esophageal tube tip is below the diaphragm but non visualized. Surgical clips at the GE junction. Increased left pleural effusion and basilar consolidation. Pulmonary infiltrates are otherwise unchanged. Stable cardiomediastinal silhouette. No pneumothorax. IMPRESSION: Increased left pleural effusion and worsening consolidation at the left lung base. Diffuse pulmonary infiltrates are otherwise  unchanged. Electronically Signed   By: Jasmine Pang M.D.   On: 11/07/2016 19:02   Dg Chest Port 1 View  Result Date: 11/07/2016 CLINICAL DATA:  Congestive heart failure. EXAM: PORTABLE CHEST 1 VIEW COMPARISON:  Radiograph of November 06, 2016. FINDINGS: Stable cardiomediastinal silhouette. Atherosclerosis of thoracic aorta is noted. Status post coronary artery bypass graft. Feeding tube is seen entering stomach. No pneumothorax is noted. Stable right lung densities are noted as well as left perihilar densities consistent with pulmonary edema or possibly pneumonia. Stable left basilar edema or atelectasis or infiltrate is noted with probable minimal associated pleural effusion. IMPRESSION: Stable bilateral lung opacities as described above, concerning for edema or possibly pneumonia. Aortic atherosclerosis. Electronically Signed   By: Lupita Raider, M.D.   On: 11/07/2016 11:50   Dg Abd Portable 1v  Result Date: 11/08/2016 CLINICAL DATA:  83 year old male status post feeding tube placement EXAM: PORTABLE ABDOMEN - 1 VIEW COMPARISON:  Prior chest x-ray 11/07/2016 FINDINGS: Weighted tip enteric feeding tube. The tip overlies the expected location of the fourth portion of the duodenum. The bowel gas pattern is not obstructed. Incompletely imaged cardiomegaly and bibasilar airspace opacities. Layering left pleural effusion. No acute osseous abnormality. Multilevel degenerative disc disease. Surgical clips project over the region of the gastroesophageal junction, over the cardiac silhouette and over the anatomic pelvis. IMPRESSION: 1. The tip of the transpyloric feeding tube overlies the expected location of the fourth portion of the duodenum. 2. Bibasilar patchy airspace opacities and a left-sided pleural effusion. Differential considerations include pulmonary edema and multi lobar pneumonia. 3. Cardiomegaly. Electronically Signed   By: Malachy Moan M.D.   On: 11/08/2016 10:05        Scheduled  Meds: . amantadine  100 mg Oral TID  . aspirin  81 mg Oral Daily  . atorvastatin  10 mg Oral QHS  . carbidopa-levodopa  1 tablet Oral TID  . feeding supplement (PRO-STAT SUGAR FREE 64)  30 mL Per Tube Daily  . free water  250 mL Per Tube Q4H  . Gerhardt's butt cream   Topical QID  . insulin aspart  0-9 Units Subcutaneous Q4H  . mouth rinse  15 mL Mouth Rinse BID  . metoprolol tartrate  2.5 mg Intravenous Q6H  . pantoprazole sodium  40 mg Per Tube Daily  . pneumococcal 23 valent vaccine  0.5 mL Intramuscular Tomorrow-1000  . primidone  50 mg Oral Q12H  . rOPINIRole  2 mg Oral Q12H   Continuous Infusions: . feeding supplement (JEVITY 1.2 CAL) Stopped (11/07/16 1719)  . fluconazole (DIFLUCAN) IV    . heparin 1,150 Units/hr (11/08/16 16100611)  . nitroGLYCERIN 30 mcg/min (11/08/16 0600)  . piperacillin-tazobactam (ZOSYN)  IV Stopped (11/08/16 0923)     LOS: 8 days    Time spent: 40 minutes    Lylian Sanagustin, MD [facility] Pager [phone]  If 7PM-7AM, please contact night-coverage [email or web address] Password [email or web address] 11/08/2016, 11:17 AM

## 2016-11-08 NOTE — Progress Notes (Signed)
Per patient and daughter we have located his cardiology information.    Card[facility]: [facility] Roseanne RenoAlan C Cheng MD, [facility] [address] [place], [place] [id number] [phone]  [facility]P: [facility] Carmon SailsHarrish Patel, MD [place], [place] [id number] [phone]

## 2016-11-08 NOTE — Progress Notes (Signed)
Patient is tolerating nasal cannula well at this time. RT will continue to monitor. BiPAP on stand-by at bedside at this time.

## 2016-11-08 NOTE — Progress Notes (Signed)
Patient taken off BIPAP and placed on 6L nasal cannula, tolerating well at this time. Patient was unable to tolerate BiPAP without very large leak.

## 2016-11-08 NOTE — Progress Notes (Signed)
Subjective:  He is feeling much better today and is alert and oriented.  [place] in [place].  Breathing is much better than last night.  No ischemic chest pain.  Respiratory distress resolved fairly quickly last night.  He tells me [place] bypass [place] was done in [place] around 20 years ago.  Objective:  Vital Signs in the last 24 hours: BP 118/62   Pulse 88   Temp 99.2 F (37.3 C) (Oral)   Resp 17   Ht 5\' 9"  (1.753 m)   Wt 80.6 kg (177 lb 11.1 oz)   SpO2 98%   BMI 26.24 kg/m   Physical Exam: Elderly pleasant male in no acute distress Lungs: Rhonchi with mild rales Cardiac:  Regular rhythm, normal S1 and S2, no S3, apical systolic murmur and S4 noted Abdomen:  Soft, nontender, no masses Extremities:  No edema present Neuro: Tremor  Intake/Output from previous day: 09/07 0701 - 09/08 0700 In: 1201.5 [I.V.:201.5; NG/GT:700; IV Piggyback:300] Out: 3050 [Urine:3050]  Weight Filed Weights   11/06/16 0430 11/07/16 0500 11/08/16 0324  Weight: 82 kg (180 lb 12.4 oz) 83 kg (182 lb 15.7 oz) 80.6 kg (177 lb 11.1 oz)    Lab Results: Basic Metabolic Panel:  Recent Labs  60/45/4007/09/18 1958 11/08/16 0740  NA 155* 155*  K 3.5 3.2*  CL 121* 114*  CO2 26 26  GLUCOSE 131* 92  BUN 33* 35*  CREATININE 1.81* 2.02*   CBC:  Recent Labs  11/06/16 0729 11/08/16 0740  WBC 11.2* 10.1  NEUTROABS  --  7.3  HGB 10.7* 10.5*  HCT 33.6* 33.0*  MCV 96.3 97.6  PLT 120* 150   Cardiac Enzymes: Troponin (Point of Care Test) No results for input(s): TROPIPOC in the last 72 hours. Cardiac Panel (last 3 results)  Recent Labs  11/07/16 1814 11/07/16 2327 11/08/16 0740  TROPONINI 0.82* 1.04* 1.10*    Telemetry: Sinus rhythm personally reviewed  Assessment/Plan:  1.  Non-STEMI 2.  Suspect acute pulmonary edema with acute decompensation last night clinically resolved now 3.  Hypokalemia 4.  Hypernatremia 5.  Parkinsonism 6.  Acute on chronic renal  failure worse today  Recommendations:  Continue IV nitroglycerin and heparin.  Continue to diurese at the present time.  Watch volume status and try to get records from [place].      Darden PalmerW. Spencer Taffany Heiser, Jr.  MD [facility]  11/08/2016, 12:00 PM

## 2016-11-08 NOTE — Progress Notes (Signed)
ANTICOAGULATION CONSULT NOTE - Follow Up Consult  Pharmacy Consult for heparin Indication: chest pain/ACS  Labs:  Recent Labs  11/06/16 0729 11/07/16 1814 11/07/16 1958 11/07/16 2327 11/08/16 0452  HGB 10.7*  --   --   --   --   HCT 33.6*  --   --   --   --   PLT 120*  --   --   --   --   HEPARINUNFRC  --   --   --   --  0.25*  CREATININE 1.84*  --  1.81*  --   --   TROPONINI 1.29* 0.82*  --  1.04*  --     Assessment: 83yo male subtherapeutic on heparin with initial dosing for concern for ACS.  Goal of Therapy:  Heparin level 0.3-0.7 units/ml   Plan:  Will increase heparin gtt by 2 units/kg/hr to 1150 units/hr and check level in 8hr.  Vernard GamblesVeronda Sylvania Moss, PharmD, BCPS  11/08/2016,6:11 AM

## 2016-11-08 NOTE — Progress Notes (Signed)
ANTICOAGULATION CONSULT NOTE  Pharmacy Consult for heparin Indication: chest pain/ACS  No Known Allergies  Patient Measurements: Height: 5\' 9"  (175.3 cm) Weight: 177 lb 11.1 oz (80.6 kg) IBW/kg (Calculated) : 70.7 Heparin Dosing Weight: 83 kg  Vital Signs: Temp: 99.2 F (37.3 C) (09/08 1115) Temp Source: Oral (09/08 1115) BP: 106/54 (09/08 1400) Pulse Rate: 78 (09/08 1400)  Labs:  Recent Labs  11/06/16 0729 11/07/16 1814 11/07/16 1958 11/07/16 2327 11/08/16 0452 11/08/16 0740 11/08/16 1356  HGB 10.7*  --   --   --   --  10.5*  --   HCT 33.6*  --   --   --   --  33.0*  --   PLT 120*  --   --   --   --  150  --   HEPARINUNFRC  --   --   --   --  0.25*  --  0.33  CREATININE 1.84*  --  1.81*  --   --  2.02*  --   TROPONINI 1.29* 0.82*  --  1.04*  --  1.10*  --     Estimated Creatinine Clearance: 28.7 mL/min (A) (by C-G formula based on SCr of 2.02 mg/dL (H)).  Assessment: 83yoM with new EKG changes. Pharmacy consulted to start heparin for ACS. Heparin level is now therapeutic at 0.33 after dose adjustment. No bleeding noted.   Goal of Therapy:  Heparin level 0.3-0.7 units/ml Monitor platelets by anticoagulation protocol: Yes   Plan:  Continue heparin gtt 1150 Daily heparin level and CBC  Lysle Pearlachel Aneshia Jacquet, PharmD, BCPS 11/08/2016 2:45 PM

## 2016-11-08 NOTE — Progress Notes (Signed)
Patient currently on 6 L [place]. Bipap on standby and will use as needed throughout day. No distress noted at this time. Will continue to monitor.

## 2016-11-09 ENCOUNTER — Inpatient Hospital Stay (HOSPITAL_COMMUNITY): Payer: Medicare Other

## 2016-11-09 LAB — POCT I-STAT 3, ART BLOOD GAS (G3+)
Acid-Base Excess: 7 mmol/L — ABNORMAL HIGH (ref 0.0–2.0)
Bicarbonate: 29.9 mmol/L — ABNORMAL HIGH (ref 20.0–28.0)
O2 Saturation: 96 %
PCO2 ART: 36.3 mmHg (ref 32.0–48.0)
PH ART: 7.523 — AB (ref 7.350–7.450)
Patient temperature: 98.1
TCO2: 31 mmol/L (ref 22–32)
pO2, Arterial: 75 mmHg — ABNORMAL LOW (ref 83.0–108.0)

## 2016-11-09 LAB — GLUCOSE, CAPILLARY
GLUCOSE-CAPILLARY: 102 mg/dL — AB (ref 65–99)
GLUCOSE-CAPILLARY: 108 mg/dL — AB (ref 65–99)
GLUCOSE-CAPILLARY: 151 mg/dL — AB (ref 65–99)
Glucose-Capillary: 158 mg/dL — ABNORMAL HIGH (ref 65–99)
Glucose-Capillary: 135 mg/dL — ABNORMAL HIGH (ref 65–99)
Glucose-Capillary: 108 mg/dL — ABNORMAL HIGH (ref 65–99)
Glucose-Capillary: 112 mg/dL — ABNORMAL HIGH (ref 65–99)
Glucose-Capillary: 98 mg/dL (ref 65–99)

## 2016-11-09 LAB — CBC WITH DIFFERENTIAL/PLATELET
BASOS PCT: 0 %
Basophils Absolute: 0 10*3/uL (ref 0.0–0.1)
EOS ABS: 0.8 10*3/uL — AB (ref 0.0–0.7)
EOS PCT: 7 %
HEMATOCRIT: 32.3 % — AB (ref 39.0–52.0)
HEMOGLOBIN: 10 g/dL — AB (ref 13.0–17.0)
LYMPHS PCT: 15 %
Lymphs Abs: 1.6 10*3/uL (ref 0.7–4.0)
MCH: 30.3 pg (ref 26.0–34.0)
MCHC: 31 g/dL (ref 30.0–36.0)
MCV: 97.9 fL (ref 78.0–100.0)
Monocytes Absolute: 0.9 10*3/uL (ref 0.1–1.0)
Monocytes Relative: 8 %
NEUTROS ABS: 7.6 10*3/uL (ref 1.7–7.7)
NEUTROS PCT: 70 %
Platelets: 153 10*3/uL (ref 150–400)
RBC: 3.3 MIL/uL — ABNORMAL LOW (ref 4.22–5.81)
RDW: 16.7 % — ABNORMAL HIGH (ref 11.5–15.5)
WBC: 10.9 10*3/uL — ABNORMAL HIGH (ref 4.0–10.5)

## 2016-11-09 LAB — POTASSIUM: Potassium: 3 mmol/L — ABNORMAL LOW (ref 3.5–5.1)

## 2016-11-09 LAB — BASIC METABOLIC PANEL
Anion gap: 10 (ref 5–15)
BUN: 39 mg/dL — AB (ref 6–20)
CHLORIDE: 110 mmol/L (ref 101–111)
CO2: 28 mmol/L (ref 22–32)
CREATININE: 2.13 mg/dL — AB (ref 0.61–1.24)
Calcium: 7.6 mg/dL — ABNORMAL LOW (ref 8.9–10.3)
GFR calc Af Amer: 32 mL/min — ABNORMAL LOW (ref >60)
GFR calc non Af Amer: 27 mL/min — ABNORMAL LOW (ref >60)
Glucose, Bld: 135 mg/dL — ABNORMAL HIGH (ref 65–99)
POTASSIUM: 3.1 mmol/L — AB (ref 3.5–5.1)
SODIUM: 148 mmol/L — AB (ref 135–145)

## 2016-11-09 LAB — CULTURE, BLOOD (ROUTINE X 2)
CULTURE: NO GROWTH
Special Requests: ADEQUATE

## 2016-11-09 LAB — HEPARIN LEVEL (UNFRACTIONATED)
HEPARIN UNFRACTIONATED: 0.36 [IU]/mL (ref 0.30–0.70)
Heparin Unfractionated: 0.23 IU/mL — ABNORMAL LOW (ref 0.30–0.70)

## 2016-11-09 MED ORDER — FUROSEMIDE 10 MG/ML IJ SOLN
60.0000 mg | Freq: Once | INTRAMUSCULAR | Status: AC
  Administered 2016-11-09: 60 mg via INTRAVENOUS
  Filled 2016-11-09: qty 6

## 2016-11-09 MED ORDER — POTASSIUM CHLORIDE 10 MEQ/100ML IV SOLN: 10.0000 meq | INTRAVENOUS | Status: DC

## 2016-11-09 MED ORDER — CHLORHEXIDINE GLUCONATE 0.12 % MT SOLN
15.0000 mL | Freq: Two times a day (BID) | OROMUCOSAL | Status: DC
  Administered 2016-11-09 – 2016-11-11 (×5): 15 mL via OROMUCOSAL

## 2016-11-09 MED ORDER — ORAL CARE MOUTH RINSE
15.0000 mL | Freq: Two times a day (BID) | OROMUCOSAL | Status: DC
  Administered 2016-11-11 – 2016-11-12 (×3): 15 mL via OROMUCOSAL

## 2016-11-09 MED ORDER — GABAPENTIN 250 MG/5ML PO SOLN
300.0000 mg | Freq: Three times a day (TID) | ORAL | Status: DC
  Administered 2016-11-09 – 2016-11-10 (×5): 300 mg via ORAL
  Filled 2016-11-09 (×7): qty 6

## 2016-11-09 MED ORDER — POTASSIUM CHLORIDE 20 MEQ/15ML (10%) PO SOLN
40.0000 meq | Freq: Once | ORAL | Status: AC
  Administered 2016-11-09: 40 meq
  Filled 2016-11-09: qty 30

## 2016-11-09 MED ORDER — FREE WATER
300.0000 mL | Status: DC
Start: 2016-11-09 — End: 2016-11-10
  Administered 2016-11-09 – 2016-11-10 (×3): 300 mL

## 2016-11-09 NOTE — Progress Notes (Signed)
Was called per nursing that patient had gone in acute respiratory distress with increased respiratory rate and increased work of breathing. Patient placed on BiPAP and patient given a dose of IV Lasix 60 mg 1 per nursing. Came to assess patient and patient on the BiPAP. Gen.: On BiPAP. Respiratory: Use of accessory muscles of respiration. Diffuse crackles and rales anterior lung fields. Cardiovascular: Tachycardic Gastrointestinal: Soft, nontender, nondistended, positive bowel sounds Extremities: No clubbing cyanosis or edema.  Assessment/plan #1 acute respiratory distress Patient noted to go into acute respiratory distress likely secondary to acute CHF exacerbation. Patient currently on the BiPAP. Check a chest x-ray. Check ABG. Patient given a dose of Lasix 60 mg IV 1. Continue IV nitroglycerin and IV heparin Strict I's and O's. Daily weights. [facility] as patient had had an episode of acute respiratory distress the night before, however critical care felt that as patient was on 6 L nasal cannula yesterday, that was nothing else to add. We'll give another dose of Lasix 60 mg IV 1 at 10 PM. Will monitor patient for now. If no significant improvement on BiPAP will need to have critical care called again.

## 2016-11-09 NOTE — Progress Notes (Signed)
RT called to pt's room due to increased RR and WOB.  Pt placed on Bipap and is tolerating well at this time.

## 2016-11-09 NOTE — Progress Notes (Signed)
ANTICOAGULATION CONSULT NOTE  Pharmacy Consult for heparin Indication: chest pain/ACS  No Known Allergies  Patient Measurements: Height: 5\' 9"  (175.3 cm) Weight: 177 lb 11.1 oz (80.6 kg) IBW/kg (Calculated) : 70.7 Heparin Dosing Weight: 83 kg  Vital Signs: Temp: 100.8 F (38.2 C) (09/09 0344) Temp Source: Oral (09/09 0344) BP: 111/72 (09/09 0230) Pulse Rate: 85 (09/09 0230)  Labs:  Recent Labs  11/06/16 0729 11/07/16 1814 11/07/16 1958 11/07/16 2327 11/08/16 0452 11/08/16 0740 11/08/16 1356 11/09/16 0321  HGB 10.7*  --   --   --   --  10.5*  --  10.0*  HCT 33.6*  --   --   --   --  33.0*  --  32.3*  PLT 120*  --   --   --   --  150  --  153  HEPARINUNFRC  --   --   --   --  0.25*  --  0.33 0.23*  CREATININE 1.84*  --  1.81*  --   --  2.02* 2.12*  --   TROPONINI 1.29* 0.82*  --  1.04*  --  1.10*  --   --     Estimated Creatinine Clearance: 27.3 mL/min (A) (by C-G formula based on SCr of 2.12 mg/dL (H)).  Assessment: 83yoM with new EKG changes. Pharmacy consulted to start heparin for ACS.   Heparin level subtherapeutic at 0.23 with no infusion issues per RN. CBC stable and no s/s bleeding noted.   Goal of Therapy:  Heparin level 0.3-0.7 units/ml Monitor platelets by anticoagulation protocol: Yes   Plan:  Increase heparin gtt to 1300 units/hr Heparin level in 8 hrs Daily heparin level and CBC Monitor for s/s bleeding   York CeriseKatherine Cook, PharmD Clinical Pharmacist 11/09/16 4:22 AM

## 2016-11-09 NOTE — Progress Notes (Signed)
[facility] FOR AM LAB REPLACEMENT ONLY  The patient does not apply for the [facility] Electrolyte [facility] Protocol based on the criteria listed below:   Is GFR >/= 40 ml/min? No.  Patient's GFR today is 27   Abnormal electrolyte(s): K3.1   If a panic level lab has been reported, has the CCM MD in charge been notified? Yes.  .   Physician:  Staci AcostaJ Nestor, MD  Melrose NakayamaChisholm, Salvatrice Morandi William 11/09/2016 4:49 AM

## 2016-11-09 NOTE — Progress Notes (Signed)
Subjective:  He just had another episode of respiratory distress.  Notice increased tachypnea and work of breathing resolved with breathing treatment.  He currently says he is not short of breath.  He has diminished breath sounds now in his bases.  His Lasix was stopped yesterday and he has been given boluses of free water by the hospitalist.  Renal function is about the same today.  Continues with tube feedings.  Objective:  Vital Signs in the last 24 hours: BP (!) 118/52   Pulse 94   Temp 98.1 F (36.7 C) (Oral)   Resp (!) 21   Ht 5\' 9"  (1.753 m)   Wt 80.1 kg (176 lb 9.4 oz)   SpO2 95%   BMI 26.08 kg/m   Physical Exam: Elderly pleasant male in no acute distress Lungs: Rhonchi with mild rales, diminished breath sounds particularly in the left base Cardiac:  Regular rhythm, normal S1 and S2, no S3, apical systolic murmur and S4 noted Abdomen:  Soft, nontender, no masses Extremities:  No edema present Neuro: Tremor  Intake/Output from previous day: 09/08 0701 - 09/09 0700 In: 2841.4 [I.V.:543.9; NG/GT:1760; IV Piggyback:537.5] Out: 2475 [Urine:2475]  Weight Filed Weights   11/07/16 0500 11/08/16 0324 11/09/16 0500  Weight: 83 kg (182 lb 15.7 oz) 80.6 kg (177 lb 11.1 oz) 80.1 kg (176 lb 9.4 oz)    Lab Results: Basic Metabolic Panel:  Recent Labs  78/29/5607/10/19 1356 11/09/16 0321 11/09/16 0822  NA 152* 148*  --   K 2.9* 3.1* 3.0*  CL 113* 110  --   CO2 28 28  --   GLUCOSE 129* 135*  --   BUN 39* 39*  --   CREATININE 2.12* 2.13*  --    CBC:  Recent Labs  11/08/16 0740 11/09/16 0321  WBC 10.1 10.9*  NEUTROABS 7.3 7.6  HGB 10.5* 10.0*  HCT 33.0* 32.3*  MCV 97.6 97.9  PLT 150 153   Cardiac Panel (last 3 results)  Recent Labs  11/07/16 1814 11/07/16 2327 11/08/16 0740  TROPONINI 0.82* 1.04* 1.10*    Telemetry: Sinus Tachycardia, personally reviewed   Assessment/Plan:  1.  Non-STEMI 2.  Worsened respiratory distress which appears to be mildly resolved  now.  Unclear whether this is pulmonary or cardiac in nature. 3.  Hypokalemia 4.  Hypernatremia 5.  Parkinsonism 6.  Acute on chronic renal failure stable today   Recommendations:  Continue IV nitroglycerin and heparin.  I would reinvolve critical care since he remains in the ICU and is having episodes of respiratory distress.  I would go ahead and give him a dose of furosemide at the present time.  [place].  Darden PalmerW. Spencer Kehlani Vancamp, Jr.  MD [facility]  11/09/2016, 11:49 AM

## 2016-11-09 NOTE — Progress Notes (Signed)
ANTICOAGULATION CONSULT NOTE  Pharmacy Consult for heparin Indication: chest pain/ACS  No Known Allergies  Patient Measurements: Height: 5\' 9"  (175.3 cm) Weight: 176 lb 9.4 oz (80.1 kg) IBW/kg (Calculated) : 70.7 Heparin Dosing Weight: 83 kg  Vital Signs: Temp: 98.1 F (36.7 C) (09/09 0815) Temp Source: Oral (09/09 0815) BP: 116/89 (09/09 1249) Pulse Rate: 106 (09/09 1249)  Labs:  Recent Labs  11/07/16 1814  11/07/16 2327  11/08/16 0740 11/08/16 1356 11/09/16 0321 11/09/16 1309  HGB  --   --   --   --  10.5*  --  10.0*  --   HCT  --   --   --   --  33.0*  --  32.3*  --   PLT  --   --   --   --  150  --  153  --   HEPARINUNFRC  --   --   --   < >  --  0.33 0.23* 0.36  CREATININE  --   < >  --   --  2.02* 2.12* 2.13*  --   TROPONINI 0.82*  --  1.04*  --  1.10*  --   --   --   < > = values in this interval not displayed.  Estimated Creatinine Clearance: 27.2 mL/min (A) (by C-G formula based on SCr of 2.13 mg/dL (H)).  Assessment: 83yoM with new EKG changes. Pharmacy consulted to start heparin for ACS.   Heparin level is now therapeutic at 0.36. CBC stable and no s/s bleeding noted.   Goal of Therapy:  Heparin level 0.3-0.7 units/ml Monitor platelets by anticoagulation protocol: Yes   Plan:  Continue heparin gtt 1300 units/hr Daily heparin level and CBC Monitor for s/s bleeding   Lysle Pearlachel Keyaira Clapham, PharmD, BCPS 11/09/2016 2:28 PM

## 2016-11-09 NOTE — Progress Notes (Signed)
Upon entering room patient became anxious, tachycardic, and had increased work of breathing.  Breath sounds with crackles throughout.  Breathing treatment administered and respiratory notified.  Patients assessment continued to worsen and patient placed on bipap.  MD notified and orders received.

## 2016-11-09 NOTE — Progress Notes (Signed)
PROGRESS NOTE    Jack Carter  ZOX:[id number] DOB: [birth year] DOA: 10/31/2016 PCP: No primary care provider on file.    Brief Narrative: Patient is a 83 year old gentleman who was admitted with generalized weakness with septic shock with cellulitis. It was noted during the hospitalization to have cardiogenic shock with bradycardia hypotension hyperkalemia from Bactrim. Patient required reintubation and successfully extubated and transferred out the ICU. Patient return back to the ICU on 11/04/2016 with respiratory failure likely secondary to aspiration of acute pulmonary edema. Patient was on the critical care service. Patient subsequently extubated and transferred to the hospitalist service. Cardiology following.   Assessment & Plan:   Principal Problem:   Cardiogenic shock (HCC) Active Problems:   Septic shock (HCC)   Cellulitis   Hyperkalemia   AKI (acute kidney injury) (HCC)   Acute hypoxemic respiratory failure (HCC)   CAD (coronary artery disease) of artery bypass graft   CHF (congestive heart failure) (HCC)   Parkinson disease (HCC)   Upper GI bleed   Elevated troponin   Acute encephalopathy   Goals of care, counseling/discussion   Aspiration into airway   Hypernatremia   Great toe pain, unspecified laterality  #1 acute hypoxia respiratory failure secondary to aspiration and pulmonary edema/CHF exacerbation Patient extubated and speaking in full sentences. Chest x-ray from 11/06/2016 consistent with volume overload. Tracheal aspirate positive for Candida species. It urine pneumococcus antigen negative. Urine Legionella antigen negative. discontinued on 11/06/2016. IV Diflucan started. Oral Lasix per tube has been resumed. Patient noted to go in acute respiratory distress with sats in the 50s evening of 11/07/2016 and noted to likely be in flash pulmonary edema. Patient given a dose of IV Lasix with a urine output of 1.7 L after IV Lasix. Patient now significantly  hypernatremic with a sodium level of 155 on 11/08/2016. Free water flushes were increased with improvement with hypernatremia. Lasix discontinued. Strict I's and O's. Daily weights.  Cardiology following.  #2 acute on chronic combined systolic and diastolic heart failure Likely secondary to cardiogenic shock and NSTEMI. 2-D echo with EF of 40-45%, grade 2 diastolic dysfunction, changes noted consistent with right ventricular volume and pressure overload. Patient noted to go in acute respiratory distress overnight with sats in the 50s and noted to be in flash pulmonary edema. Patient given a dose of IV Lasix and placed on the BiPAP. Patient now on 6 L nasal cannula. Patient with a urine output of 2.475 L over the past 24 hours. Patient still volume overloaded clinically on examination. Patient with worsening hypernatremia and as such Lasix was discontinued on 11/08/2016. Patient on free water. Cardiology following. Strict I's and O's. Daily weights.  #3 non-STEMI/cardiogenic shock/coronary artery disease Questionable etiology. 2-D echo with a EF of 40-45%, grade 2 diastolic dysfunction. Changes noted consistent with right ventricular volume and pressure overload. Patient status post CABG. Patient with elevated troponins consistent with non-STEMI. Patient currently chest pain-free. Per cardiology ischemic evaluation with coronary angiography should be delayed until patient is much improved and kidney function is stable.  Patient noted to go into flash pulmonary edema the night of 11/07/2016. Cardiac enzymes cycled elevated. Patient given a dose of IV Lasix. BNP elevated. Patient still on nitroglycerin drip and IV heparin. Continue aspirin and Lipitor. Per cardiology.  #4 hyperkalemia Likely secondary to ongoing potassium supplementation and the presence of Diovan/Bactrim in the setting of acute kidney injury. Patient was seen by nephrology were recommended medical management. Patient now hypokalemic with a  potassium of 3.0.  Follow.  #4 hypokalemia Likely secondary to diuresis. Replete.  #5 dysphagia Patient receiving Jevity tube feeds. Speech therapy following.  #6 hypernatremia Sodium level started to trend back down. Sodium level now at 148 from 152 from 155. Patient with an acute CHF exacerbation and was being diuresed. Will increase free water to 300 mg per tube every 4 hours. Discontinued Lasix. Follow.   #7 cellulitis Improved. Continue Zosyn.  #8 acute metabolic encephalopathy Patient with history of Parkinson's disease, restless leg syndrome. Likely close to baseline. Continue amantadine, Sinemet, Requip.  #9 anemia of chronic disease Patient with no overt bleeding. H&H stable.  #10 acute kidney injury vs CKD Per nephrology hemodynamically mediated with possible ATN secondary to shock versus secondary to Bactrim. Baseline creatinine unknown. Renal ultrasound negative for any hydro-nephrosis. Renal function seems to have plateaued monitor closely. Follow.  #11 bilateral great toe pain/probable neuropathy Uric acid level was decreased. Patient likely with a neuropathy. Will place patient on Neurontin 300 mg per tube 3 times daily.   DVT prophylaxis: Heparin Code Status: Full Family Communication: Updated patient. No family at bedside. Disposition Plan: Remain in ICU. Disposition pending hospital course and PT evaluation.   Consultants:   Pccm: Dr. Molli KnockYacoub 11/04/2016/Dr. Sood 11/05/2016  Nephrology: Dr. Allena KatzPatel 10/31/2016  Cardiology: Dr. Katrinka BlazingSmith 11/04/2016  Procedures:   CT head without contrast 11/01/2016  Chest x-ray 10/31/2016, 11/04/2016, 11/05/2016, 11/06/2016, 11/07/2016  Renal ultrasound 10/31/2016  2-D echo 11/03/2016--- EF 40-45% with grade 2 diastolic dysfunction. Right ventricular volume pressure overload. Mild to moderate mitral valvular regurgitation. Moderately dilated left HM. Moderately dilated) sugar. Right ventricular systolic function moderately  reduced. Right atrium moderately dilated.  Abdominal films 11/08/2016  Antimicrobials:   Zosyn  8/31>>9/3 Unasyn 9/3>>9/4 Vanc 8/31>>8/31 Vanc 9/4>>9/06 Zosyn 9/4>> IV Diflucan 11/07/2016  SIGNIFICANT EVENTS: Intubated 8/31>> 9/2 Re-intubated 9/4>> 9/05  LINES/TUBES: femoral CVC >> 906  CULTURES: Blood 8/31 >> negative Urine 9/01 >> negative Blood 9/4>> GPC in clusters >>  Urine Strep 9/4>> Negative Urine Culture 9/4>> negative Urine Legionella 9/4>> negative Sputum Culture/tracheal aspirate 9/4>> few Candida Krusei, moderate candida out the cans   Subjective: Patient laying in bed denies any chest pain. Shortness of breath improving per patient. Patient on 6 L nasal cannula.  Objective: Vitals:   11/09/16 0500 11/09/16 0530 11/09/16 0600 11/09/16 0815  BP: (!) 105/50 97/62 112/60   Pulse: 89 90 90   Resp: (!) 24 (!) 30 (!) 32   Temp:    98.1 F (36.7 C)  TempSrc:    Oral  SpO2: 99% 98% 98%   Weight: 80.1 kg (176 lb 9.4 oz)     Height:        Intake/Output Summary (Last 24 hours) at 11/09/16 1043 Last data filed at 11/09/16 0600  Gross per 24 hour  Intake          2779.87 ml  Output             1550 ml  Net          1229.87 ml   Filed Weights   11/07/16 0500 11/08/16 0324 11/09/16 0500  Weight: 83 kg (182 lb 15.7 oz) 80.6 kg (177 lb 11.1 oz) 80.1 kg (176 lb 9.4 oz)    Examination:  General exam: Chronically ill-appearing.  Respiratory system: Diffuse crackles. No wheezing. No rhonchi.  Cardiovascular system: S1 & S2 heard, RRR. No JVD, murmurs, rubs, gallops or clicks. No pedal edema. Gastrointestinal system: Abdomen is nondistended, soft and nontender. No organomegaly  or masses felt. Normal bowel sounds heard. Central nervous system: Alert and oriented. No focal neurological deficits. Extremities: Venous stasis changes noted. Symmetric 5 x 5 power. Bilateral great toe pain and foot pain. Skin: No rashes, lesions or ulcers Psychiatry: Judgement and  insight appear fair. Mood & affect appropriate.     Data Reviewed: I have personally reviewed following labs and imaging studies  CBC:  Recent Labs Lab 11/02/16 1745 11/03/16 0400 11/03/16 0916 11/04/16 0447 11/05/16 0552 11/06/16 0729 11/08/16 0740 11/09/16 0321  WBC 9.4 8.1 8.1 7.4 7.6 11.2* 10.1 10.9*  NEUTROABS 7.5 6.2 6.3  --   --   --  7.3 7.6  HGB 10.3* 10.4* 10.7* 10.4* 10.0* 10.7* 10.5* 10.0*  HCT 33.7* 32.8* 34.3* 32.8* 32.1* 33.6* 33.0* 32.3*  MCV 98.8 97.9 98.3 96.2 97.0 96.3 97.6 97.9  PLT 117* 118* 138* 126* 122* 120* 150 153   Basic Metabolic Panel:  Recent Labs Lab 11/04/16 0447 11/04/16 1753 11/05/16 0552 11/06/16 0729 11/07/16 1958 11/08/16 0740 11/08/16 1356 11/09/16 0321 11/09/16 0822  NA 148* 146* 147* 149* 155* 155* 152* 148*  --   K 2.8* 3.6 3.4* 3.3* 3.5 3.2* 2.9* 3.1* 3.0*  CL 115* 114* 115* 114* 121* 114* 113* 110  --   CO2 --   GLUCOSE 126* 125* 139* 119* 131* 92 129* 135*  --   BUN 22* 22* 28* 27* 33* 35* 39* 39*  --   CREATININE 1.78* 1.96* 1.98* 1.84* 1.81* 2.02* 2.12* 2.13*  --   CALCIUM 7.6* 7.5* 7.5* 8.0* 7.3* 7.9* 7.8* 7.6*  --   MG 1.4* 2.2 2.1 1.8  --  1.7  --   --   --   PHOS  --  1.8*  --   --   --   --   --   --   --    GFR: Estimated Creatinine Clearance: 27.2 mL/min (A) (by C-G formula based on SCr of 2.13 mg/dL (H)). Liver Function Tests: No results for input(s): AST, ALT, ALKPHOS, BILITOT, PROT, ALBUMIN in the last 168 hours. No results for input(s): LIPASE, AMYLASE in the last 168 hours. No results for input(s): AMMONIA in the last 168 hours. Coagulation Profile: No results for input(s): INR, PROTIME in the last 168 hours. Cardiac Enzymes:  Recent Labs Lab 11/05/16 1107 11/06/16 0729 11/07/16 1814 11/07/16 2327 11/08/16 0740  TROPONINI 2.01* 1.29* 0.82* 1.04* 1.10*   BNP (last 3 results) No results for input(s): PROBNP in the last 8760 hours. HbA1C: No results for input(s):  HGBA1C in the last 72 hours. CBG:  Recent Labs Lab 11/08/16 1528 11/08/16 1934 11/08/16 2338 11/09/16 0343 11/09/16 0814  GLUCAP 156* 144* 151* 158* 135*   Lipid Profile: No results for input(s): CHOL, HDL, LDLCALC, TRIG, CHOLHDL, LDLDIRECT in the last 72 hours. Thyroid Function Tests: No results for input(s): TSH, T4TOTAL, FREET4, T3FREE, THYROIDAB in the last 72 hours. Anemia Panel: No results for input(s): VITAMINB12, FOLATE, FERRITIN, TIBC, IRON, RETICCTPCT in the last 72 hours. Sepsis Labs: No results for input(s): PROCALCITON, LATICACIDVEN in the last 168 hours.  Recent Results (from the past 240 hour(s))  Culture, blood (routine x 2)     Status: None   Collection Time: 10/31/16 10:10 PM  Result Value Ref Range Status   Specimen Description BLOOD LEFT HAND  Final   Special Requests IN PEDIATRIC BOTTLE Blood Culture adequate volume  Final   Culture NO GROWTH 5  DAYS  Final   Report Status 11/05/2016 FINAL  Final  Culture, blood (routine x 2)     Status: None   Collection Time: 10/31/16 10:20 PM  Result Value Ref Range Status   Specimen Description BLOOD RIGHT HAND  Final   Special Requests IN PEDIATRIC BOTTLE Blood Culture adequate volume  Final   Culture NO GROWTH 5 DAYS  Final   Report Status 11/05/2016 FINAL  Final  Urine culture     Status: None   Collection Time: 11/01/16 12:30 AM  Result Value Ref Range Status   Specimen Description URINE, RANDOM  Final   Special Requests NONE  Final   Culture NO GROWTH  Final   Report Status 11/02/2016 FINAL  Final  MRSA PCR Screening     Status: None   Collection Time: 11/01/16  1:47 AM  Result Value Ref Range Status   MRSA by PCR NEGATIVE NEGATIVE Final    Comment:        The GeneXpert MRSA Assay (FDA approved for NASAL specimens only), is one component of a comprehensive MRSA colonization surveillance program. It is not intended to diagnose MRSA infection nor to guide or monitor treatment for MRSA infections.     Culture, respiratory (NON-Expectorated)     Status: None   Collection Time: 11/04/16 11:32 AM  Result Value Ref Range Status   Specimen Description TRACHEAL ASPIRATE  Final   Special Requests NONE  Final   Gram Stain   Final    MODERATE WBC PRESENT, PREDOMINANTLY PMN FEW SQUAMOUS EPITHELIAL CELLS PRESENT RARE YEAST    Culture FEW CANDIDA KRUSEI MODERATE CANDIDA ALBICANS   Final   Report Status 11/06/2016 FINAL  Final  Culture, Urine     Status: None   Collection Time: 11/04/16 12:00 PM  Result Value Ref Range Status   Specimen Description URINE, CATHETERIZED  Final   Special Requests NONE  Final   Culture NO GROWTH  Final   Report Status 11/05/2016 FINAL  Final  Culture, blood (routine x 2)     Status: None   Collection Time: 11/04/16  1:30 PM  Result Value Ref Range Status   Specimen Description BLOOD LEFT HAND  Final   Special Requests   Final    BOTTLES DRAWN AEROBIC ONLY Blood Culture adequate volume   Culture NO GROWTH 5 DAYS  Final   Report Status 11/09/2016 FINAL  Final  Culture, blood (routine x 2)     Status: Abnormal   Collection Time: 11/04/16  1:36 PM  Result Value Ref Range Status   Specimen Description BLOOD LEFT ARM  Final   Special Requests IN PEDIATRIC BOTTLE Blood Culture adequate volume  Final   Culture  Setup Time   Final    GRAM POSITIVE COCCI IN CLUSTERS IN PEDIATRIC BOTTLE CRITICAL RESULT CALLED TO, READ BACK BY AND VERIFIED WITH: A JOHNSTON 11/05/16 @ 0919 M VESTAL    Culture (A)  Final    STAPHYLOCOCCUS SPECIES (COAGULASE NEGATIVE) THE SIGNIFICANCE OF ISOLATING THIS ORGANISM FROM A SINGLE SET OF BLOOD CULTURES WHEN MULTIPLE SETS ARE DRAWN IS UNCERTAIN. PLEASE NOTIFY THE MICROBIOLOGY DEPARTMENT WITHIN ONE WEEK IF SPECIATION AND SENSITIVITIES ARE REQUIRED.    Report Status 11/06/2016 FINAL  Final  Blood Culture ID Panel (Reflexed)     Status: Abnormal   Collection Time: 11/04/16  1:36 PM  Result Value Ref Range Status   Enterococcus species NOT  DETECTED NOT DETECTED Final   Listeria monocytogenes NOT DETECTED NOT DETECTED Final  Staphylococcus species DETECTED (A) NOT DETECTED Final    Comment: CRITICAL RESULT CALLED TO, READ BACK BY AND VERIFIED WITH: A JOHNSTON 11/05/16 @ 0919 M VESTAL    Staphylococcus aureus NOT DETECTED NOT DETECTED Final   Methicillin resistance DETECTED (A) NOT DETECTED Final    Comment: CRITICAL RESULT CALLED TO, READ BACK BY AND VERIFIED WITH: A JOHNSTON 11/05/16 @ 0919 M VESTAL    Streptococcus species NOT DETECTED NOT DETECTED Final   Streptococcus agalactiae NOT DETECTED NOT DETECTED Final   Streptococcus pneumoniae NOT DETECTED NOT DETECTED Final   Streptococcus pyogenes NOT DETECTED NOT DETECTED Final   Acinetobacter baumannii NOT DETECTED NOT DETECTED Final   Enterobacteriaceae species NOT DETECTED NOT DETECTED Final   Enterobacter cloacae complex NOT DETECTED NOT DETECTED Final   Escherichia coli NOT DETECTED NOT DETECTED Final   Klebsiella oxytoca NOT DETECTED NOT DETECTED Final   Klebsiella pneumoniae NOT DETECTED NOT DETECTED Final   Proteus species NOT DETECTED NOT DETECTED Final   Serratia marcescens NOT DETECTED NOT DETECTED Final   Haemophilus influenzae NOT DETECTED NOT DETECTED Final   Neisseria meningitidis NOT DETECTED NOT DETECTED Final   Pseudomonas aeruginosa NOT DETECTED NOT DETECTED Final   Candida albicans NOT DETECTED NOT DETECTED Final   Candida glabrata NOT DETECTED NOT DETECTED Final   Candida krusei NOT DETECTED NOT DETECTED Final   Candida parapsilosis NOT DETECTED NOT DETECTED Final   Candida tropicalis NOT DETECTED NOT DETECTED Final         Radiology Studies: Dg Chest Port 1 View  Result Date: 11/07/2016 CLINICAL DATA:  Acute respiratory failure EXAM: PORTABLE CHEST 1 VIEW COMPARISON:  11/07/2016 FINDINGS: Post sternotomy changes. Esophageal tube tip is below the diaphragm but non visualized. Surgical clips at the GE junction. Increased left pleural effusion and  basilar consolidation. Pulmonary infiltrates are otherwise unchanged. Stable cardiomediastinal silhouette. No pneumothorax. IMPRESSION: Increased left pleural effusion and worsening consolidation at the left lung base. Diffuse pulmonary infiltrates are otherwise unchanged. Electronically Signed   By: Jasmine Pang M.D.   On: 11/07/2016 19:02   Dg Chest Port 1 View  Result Date: 11/07/2016 CLINICAL DATA:  Congestive heart failure. EXAM: PORTABLE CHEST 1 VIEW COMPARISON:  Radiograph of November 06, 2016. FINDINGS: Stable cardiomediastinal silhouette. Atherosclerosis of thoracic aorta is noted. Status post coronary artery bypass graft. Feeding tube is seen entering stomach. No pneumothorax is noted. Stable right lung densities are noted as well as left perihilar densities consistent with pulmonary edema or possibly pneumonia. Stable left basilar edema or atelectasis or infiltrate is noted with probable minimal associated pleural effusion. IMPRESSION: Stable bilateral lung opacities as described above, concerning for edema or possibly pneumonia. Aortic atherosclerosis. Electronically Signed   By: Lupita Raider, M.D.   On: 11/07/2016 11:50   Dg Abd Portable 1v  Result Date: 11/08/2016 CLINICAL DATA:  83 year old male status post feeding tube placement EXAM: PORTABLE ABDOMEN - 1 VIEW COMPARISON:  Prior chest x-ray 11/07/2016 FINDINGS: Weighted tip enteric feeding tube. The tip overlies the expected location of the fourth portion of the duodenum. The bowel gas pattern is not obstructed. Incompletely imaged cardiomegaly and bibasilar airspace opacities. Layering left pleural effusion. No acute osseous abnormality. Multilevel degenerative disc disease. Surgical clips project over the region of the gastroesophageal junction, over the cardiac silhouette and over the anatomic pelvis. IMPRESSION: 1. The tip of the transpyloric feeding tube overlies the expected location of the fourth portion of the duodenum. 2. Bibasilar  patchy airspace opacities and a left-sided  pleural effusion. Differential considerations include pulmonary edema and multi lobar pneumonia. 3. Cardiomegaly. Electronically Signed   By: Malachy Moan M.D.   On: 11/08/2016 10:05        Scheduled Meds: . amantadine  100 mg Oral TID  . aspirin  81 mg Oral Daily  . atorvastatin  10 mg Oral QHS  . carbidopa-levodopa  1 tablet Oral TID  . chlorhexidine  15 mL Mouth Rinse BID  . feeding supplement (PRO-STAT SUGAR FREE 64)  30 mL Per Tube Daily  . free water  300 mL Per Tube Q4H  . gabapentin  300 mg Oral Q8H  . Gerhardt's butt cream   Topical QID  . insulin aspart  0-9 Units Subcutaneous Q4H  . mouth rinse  15 mL Mouth Rinse q12n4p  . metoprolol tartrate  2.5 mg Intravenous Q6H  . pantoprazole sodium  40 mg Per Tube Daily  . pneumococcal 23 valent vaccine  0.5 mL Intramuscular Tomorrow-1000  . primidone  50 mg Oral Q12H  . rOPINIRole  2 mg Oral Q12H   Continuous Infusions: . feeding supplement (JEVITY 1.2 CAL) 1,000 mL (11/09/16 0600)  . fluconazole (DIFLUCAN) IV 100 mg (11/09/16 0917)  . heparin 1,300 Units/hr (11/09/16 0600)  . nitroGLYCERIN 30 mcg/min (11/09/16 0600)  . piperacillin-tazobactam (ZOSYN)  IV 3.375 g (11/09/16 0534)     LOS: 9 days    Time spent: 40 minutes    Forrest Demuro, MD [facility] Pager [phone]  If 7PM-7AM, please contact night-coverage [email or web address] Password [email or web address] 11/09/2016, 10:43 AM

## 2016-11-10 ENCOUNTER — Inpatient Hospital Stay (HOSPITAL_COMMUNITY): Payer: Medicare Other

## 2016-11-10 DIAGNOSIS — R748 Abnormal levels of other serum enzymes: Secondary | ICD-10-CM

## 2016-11-10 DIAGNOSIS — I5043 Acute on chronic combined systolic (congestive) and diastolic (congestive) heart failure: Secondary | ICD-10-CM

## 2016-11-10 DIAGNOSIS — I214 Non-ST elevation (NSTEMI) myocardial infarction: Secondary | ICD-10-CM

## 2016-11-10 DIAGNOSIS — J9600 Acute respiratory failure, unspecified whether with hypoxia or hypercapnia: Secondary | ICD-10-CM

## 2016-11-10 DIAGNOSIS — I2510 Atherosclerotic heart disease of native coronary artery without angina pectoris: Secondary | ICD-10-CM

## 2016-11-10 DIAGNOSIS — J9601 Acute respiratory failure with hypoxia: Secondary | ICD-10-CM

## 2016-11-10 LAB — MAGNESIUM: Magnesium: 2 mg/dL (ref 1.7–2.4)

## 2016-11-10 LAB — CBC WITH DIFFERENTIAL/PLATELET
BASOS PCT: 1 %
Basophils Absolute: 0.1 10*3/uL (ref 0.0–0.1)
EOS PCT: 6 %
Eosinophils Absolute: 0.7 10*3/uL (ref 0.0–0.7)
HEMATOCRIT: 34.3 % — AB (ref 39.0–52.0)
HEMOGLOBIN: 10.9 g/dL — AB (ref 13.0–17.0)
LYMPHS PCT: 18 %
Lymphs Abs: 2 10*3/uL (ref 0.7–4.0)
MCH: 30.4 pg (ref 26.0–34.0)
MCHC: 31.8 g/dL (ref 30.0–36.0)
MCV: 95.5 fL (ref 78.0–100.0)
MONOS PCT: 7 %
Monocytes Absolute: 0.8 10*3/uL (ref 0.1–1.0)
NEUTROS ABS: 7.7 10*3/uL (ref 1.7–7.7)
Neutrophils Relative %: 68 %
Platelets: 164 10*3/uL (ref 150–400)
RBC: 3.59 MIL/uL — ABNORMAL LOW (ref 4.22–5.81)
RDW: 16.8 % — ABNORMAL HIGH (ref 11.5–15.5)
WBC: 11.3 10*3/uL — ABNORMAL HIGH (ref 4.0–10.5)

## 2016-11-10 LAB — GLUCOSE, CAPILLARY
GLUCOSE-CAPILLARY: 146 mg/dL — AB (ref 65–99)
GLUCOSE-CAPILLARY: 151 mg/dL — AB (ref 65–99)
Glucose-Capillary: 90 mg/dL (ref 65–99)
Glucose-Capillary: 106 mg/dL — ABNORMAL HIGH (ref 65–99)
Glucose-Capillary: 116 mg/dL — ABNORMAL HIGH (ref 65–99)
Glucose-Capillary: 119 mg/dL — ABNORMAL HIGH (ref 65–99)

## 2016-11-10 LAB — EXPECTORATED SPUTUM ASSESSMENT W REFEX TO RESP CULTURE

## 2016-11-10 LAB — BASIC METABOLIC PANEL
Anion gap: 11 (ref 5–15)
BUN: 38 mg/dL — ABNORMAL HIGH (ref 6–20)
CHLORIDE: 115 mmol/L — AB (ref 101–111)
CO2: 27 mmol/L (ref 22–32)
CREATININE: 2.32 mg/dL — AB (ref 0.61–1.24)
Calcium: 8 mg/dL — ABNORMAL LOW (ref 8.9–10.3)
GFR calc Af Amer: 29 mL/min — ABNORMAL LOW (ref >60)
GFR calc non Af Amer: 25 mL/min — ABNORMAL LOW (ref >60)
Glucose, Bld: 92 mg/dL (ref 65–99)
Potassium: 3 mmol/L — ABNORMAL LOW (ref 3.5–5.1)
Sodium: 153 mmol/L — ABNORMAL HIGH (ref 135–145)

## 2016-11-10 LAB — PROCALCITONIN: PROCALCITONIN: 0.44 ng/mL

## 2016-11-10 LAB — D-DIMER, QUANTITATIVE (NOT AT ARMC): D DIMER QUANT: 3.36 ug{FEU}/mL — AB (ref 0.00–0.50)

## 2016-11-10 LAB — HEPARIN LEVEL (UNFRACTIONATED): Heparin Unfractionated: 0.36 IU/mL (ref 0.30–0.70)

## 2016-11-10 LAB — EXPECTORATED SPUTUM ASSESSMENT W GRAM STAIN, RFLX TO RESP C

## 2016-11-10 MED ORDER — POTASSIUM CHLORIDE 20 MEQ/15ML (10%) PO SOLN
40.0000 meq | ORAL | Status: AC
  Administered 2016-11-10 (×2): 40 meq
  Filled 2016-11-10 (×2): qty 30

## 2016-11-10 MED ORDER — FREE WATER
400.0000 mL | Status: DC
  Administered 2016-11-10 – 2016-11-16 (×35): 400 mL

## 2016-11-10 MED ORDER — RESOURCE THICKENUP CLEAR PO POWD
ORAL | Status: DC | PRN
  Filled 2016-11-10: qty 125

## 2016-11-10 NOTE — Progress Notes (Signed)
Progress Note  Patient Name: Jack Carter Date of Encounter: 11/10/2016  Primary Cardiologist: Dr. Katrinka Blazing (New, recently relocated from [place])  Subjective   No complaints currently. He denies CP.   Inpatient Medications    Scheduled Meds: . amantadine  100 mg Oral TID  . aspirin  81 mg Oral Daily  . atorvastatin  10 mg Oral QHS  . carbidopa-levodopa  1 tablet Oral TID  . chlorhexidine  15 mL Mouth Rinse BID  . feeding supplement (PRO-STAT SUGAR FREE 64)  30 mL Per Tube Daily  . free water  300 mL Per Tube Q4H  . gabapentin  300 mg Oral Q8H  . Gerhardt's butt cream   Topical QID  . insulin aspart  0-9 Units Subcutaneous Q4H  . mouth rinse  15 mL Mouth Rinse q12n4p  . metoprolol tartrate  2.5 mg Intravenous Q6H  . pantoprazole sodium  40 mg Per Tube Daily  . pneumococcal 23 valent vaccine  0.5 mL Intramuscular Tomorrow-1000  . potassium chloride  40 mEq Per Tube Q4H  . primidone  50 mg Oral Q12H  . rOPINIRole  2 mg Oral Q12H   Continuous Infusions: . feeding supplement (JEVITY 1.2 CAL) 1,000 mL (11/10/16 0529)  . fluconazole (DIFLUCAN) IV Stopped (11/09/16 1017)  . heparin 1,300 Units/hr (11/10/16 0834)  . nitroGLYCERIN 25 mcg/min (11/10/16 0839)  . piperacillin-tazobactam (ZOSYN)  IV 3.375 g (11/10/16 0553)   PRN Meds: albuterol, bisacodyl, fentaNYL (SUBLIMAZE) injection, nitroGLYCERIN, sodium chloride flush, traMADol   Vital Signs    Vitals:   11/10/16 0530 11/10/16 0600 11/10/16 0700 11/10/16 0800  BP:  (!) 107/48 (!) 106/50 (!) 102/49  Pulse: 91 96 88 87  Resp: (!) 22  Temp:      TempSrc:      SpO2: 96% 97% 99% 97%  Weight:      Height:        Intake/Output Summary (Last 24 hours) at 11/10/16 0944 Last data filed at 11/10/16 0900  Gross per 24 hour  Intake          1350.82 ml  Output             4175 ml  Net         -2824.18 ml   Filed Weights   11/08/16 0324 11/09/16 0500 11/10/16 0350  Weight: 177 lb 11.1 oz (80.6 kg) 176 lb 9.4  oz (80.1 kg) 173 lb 4.5 oz (78.6 kg)    Telemetry    NSR - Personally Reviewed  ECG    NSR with inferior and anterolateral ST abnormalities. Personally Reviewed  Physical Exam   GEN: No acute distress.   Neck: No JVD Cardiac: RRR, no murmurs, rubs, or gallops.  Respiratory: bibasilar crackles at the bases GI: Soft, nontender, non-distended  MS: No edema; No deformity. Neuro:  Nonfocal  Psych: Normal affect   Labs    Chemistry Recent Labs Lab 11/08/16 1356 11/09/16 0321 11/09/16 0822 11/10/16 0514  NA 152* 148*  --  153*  K 2.9* 3.1* 3.0* 3.0*  CL 113* 110  --  115*  CO2 28 28  --  27  GLUCOSE 129* 135*  --  92  BUN 39* 39*  --  38*  CREATININE 2.12* 2.13*  --  2.32*  CALCIUM 7.8* 7.6*  --  8.0*  GFRNONAA 28* 27*  --  25*  GFRAA 32* 32*  --  29*  ANIONGAP 11 10  --  11  Hematology Recent Labs Lab 11/08/16 0740 11/09/16 0321 11/10/16 0514  WBC 10.1 10.9* 11.3*  RBC 3.38* 3.30* 3.59*  HGB 10.5* 10.0* 10.9*  HCT 33.0* 32.3* 34.3*  MCV 97.6 97.9 95.5  MCH 31.1 30.3 30.4  MCHC 31.8 31.0 31.8  RDW 17.0* 16.7* 16.8*  PLT 150 153 164    Cardiac Enzymes Recent Labs Lab 11/06/16 0729 11/07/16 1814 11/07/16 2327 11/08/16 0740  TROPONINI 1.29* 0.82* 1.04* 1.10*   No results for input(s): TROPIPOC in the last 168 hours.   BNP Recent Labs Lab 11/06/16 0729 11/08/16 0452  BNP 813.0* 657.0*     DDimer No results for input(s): DDIMER in the last 168 hours.   Radiology    Dg Chest Port 1 View  Result Date: 11/10/2016 CLINICAL DATA:  Hypoxia EXAM: PORTABLE CHEST 1 VIEW COMPARISON:  11/09/2016 FINDINGS: Multifocal patchy opacities, right upper lobe and left lower lobe predominant. While mild to moderate interstitial edema is possible, superimposed pneumonia is suspected. Possible small left pleural effusion.  No pneumothorax. Cardiomegaly. Postsurgical changes related to prior CABG. Median sternotomy. Enteric tube courses below the diaphragm.  IMPRESSION: Multifocal patchy opacities, favoring pneumonia superimposed on interstitial edema, stable versus mildly increased. Possible small left pleural effusion. Electronically Signed   By: Charline BillsSriyesh  Krishnan M.D.   On: 11/10/2016 07:11   Dg Chest Port 1v Same Day  Result Date: 11/09/2016 CLINICAL DATA:  Dyspnea EXAM: PORTABLE CHEST 1 VIEW COMPARISON:  11/07/2016 FINDINGS: Progression of right upper lobe airspace disease. Mild improvement in right lower lobe airspace disease. Mild improvement in left lower lobe airspace disease and left effusion. Vascular congestion and possible edema. IMPRESSION: Progression of right upper lobe infiltrate and improvement in right lower lobe infiltrate. Improvement in left lower lobe airspace disease. Possible edema versus pneumonia Electronically Signed   By: Marlan Palauharles  Clark M.D.   On: 11/09/2016 14:18    Cardiac Studies   ECHO 11/03/16: Study Conclusions  - Left ventricle: The cavity size was normal. Systolic function was mildly to moderately reduced. The estimated ejection fraction was in the range of 40% to 45%. Features are consistent with a pseudonormal left ventricular filling pattern, with concomitant abnormal relaxation and increased filling pressure (grade 2 diastolic dysfunction). - Ventricular septum: The contour showed diastolic flattening and systolic flattening. These changes are consistent with RV volume and pressure overload. - Aortic valve: There was trivial regurgitation. - Mitral valve: There was mild to moderate regurgitation directed centrally. - Left atrium: The atrium was moderately dilated. - Right ventricle: The cavity size was moderately dilated. Wall thickness was normal. Systolic function was moderately reduced. - Right atrium: The atrium was moderately dilated. - Atrial septum: No defect or patent foramen ovale was identified. - Tricuspid valve: There was malcoaptation of the valve leaflets. There was  severe regurgitation directed centrally. - Pulmonary arteries: Systolic pressure was moderately increased. PA peak pressure: 60 mm Hg (S).  Patient Profile     83 y.o. male with a hx of CKD,  Parkinson's disease and CAD s/p prior CABG ([place] [facility]whom cardiology was consulted to see for elevated troponinand suddenly development of respiratory distress requiring intubation with transfer to ICU on 11/04/16. Felt decompensation due to aspiration and ? CHF.  Assessment & Plan    1. Combined Systolic and Diastolic HF: Echo this admit shows EF of 40-45% w/ G2DD. Moderate MR. He still sounds wet by examination (has been getting free water for hypernatremia). Bilateral crackles noted.  He needs additional lasix. However his sodium is high at 153 and SCr is trending up at 2.32 and BP is soft. Hospitalist seeing now. Will discuss diuretics with Dr. Clifton James .   2. CAD: h/o CABG in [place]. We are trying to obtain records from [place]. He is CP free. Continue IV heparin and nitro. Given CKD, we are managing medically for now.   3. NSTEMI: troponin peaked at 4.81 on 11/04/16 and started to downdtrend but slightly going back up, 0.82>>1.04>>1.10. 2D echo with reduced EF, down to 40-45%. Per echo report, Images were inadequate for LV wall motion assessment. Suspect anterior and apical hypokinesis. Unsure of pt's baseline. Records from [place] not at [facility]. He is CP free on IV heparin and nitro. Medical management given CKD and other co morbidities.   For questions or updates, please contact [facility] Please consult [email or web address] for contact info under Cardiology/STEMI. Daytime calls, contact the Day Call APP (6a-8a) or assigned team (Teams A-D) provider (7:30a - 5p). All other daytime calls (7:30-5p), contact the Card Master @ [phone].   Nighttime calls, contact the assigned APP (5p-8p) or MD (6:30p-8p). Overnight calls (8p-6a), contact the on call Fellow @  [phone].      Signed, Robbie Lis, PA-C  11/10/2016, 9:44 AM    I have personally seen and examined this patient with Robbie Lis, PA-C. I agree with the assessment and plan as outlined above. He was admitted with cellulitis and possible sepsis. He has had volume overload with respiratory distress and likely superimposed pneumonia. He is not improving clinically. LVEF=40-45% by echo. Sodium 153 today. Creatinine 2.3. He received IV Lasix last night with 3 liters out over last 24 hours. His lung exam is abnormal with diffuse rales and crackles. My exam shows a WDWN male lying in bed. He is awake and oriented. CV: RRR. Lungs:diffuse crackles. Ext: no LE edema. Labs reviewed by me.  Plan: His weight is down 20 lbs from admission. His lung exam is markedly abnormal. Chest x-ray from today with interstitial edema and infiltrate c/w pneumonia. He is on Zosyn. I think his respiratory distress is likely a combination of pneumonia and volume overload. He is known to have CAD with prior CABG. Troponin was mildly elevated on admission. He is not a candidate for cardiac cath currently with renal dysfunction. Diuresis has been complicated by renal failure and hypernatremia. I will ask out CHF team to get involved to see if there are any other changes in therapy that may be beneficial. A right heart cath may be helpful.   Verne Carrow 11/10/2016 11:30 AM

## 2016-11-10 NOTE — Progress Notes (Signed)
PROGRESS NOTE    Jack MiniumCharles L Carter  ZOX:[id number] DOB: 01/04/1934 DOA: 10/31/2016 PCP: No primary care provider on file.    Brief Narrative: Patient is a 83 year old gentleman who was admitted with generalized weakness with septic shock with cellulitis. It was noted during the hospitalization to have cardiogenic shock with bradycardia hypotension hyperkalemia from Bactrim. Patient required reintubation and successfully extubated and transferred out the ICU. Patient return back to the ICU on 11/04/2016 with respiratory failure likely secondary to aspiration of acute pulmonary edema. Patient was on the critical care service. Patient subsequently extubated and transferred to the hospitalist service. Cardiology following.   Assessment & Plan:   Principal Problem:   Cardiogenic shock (HCC) Active Problems:   Septic shock (HCC)   Cellulitis   Hyperkalemia   AKI (acute kidney injury) (HCC)   Acute hypoxemic respiratory failure (HCC)   CAD (coronary artery disease) of artery bypass graft   CHF (congestive heart failure) (HCC)   Parkinson disease (HCC)   Upper GI bleed   Elevated troponin   Acute encephalopathy   Goals of care, counseling/discussion   Aspiration into airway   Hypernatremia   Great toe pain, unspecified laterality  #1 acute hypoxia respiratory failure secondary to aspiration and pulmonary edema/CHF exacerbation Patient extubated and speaking in full sentences. Chest x-ray from 11/06/2016 consistent with volume overload. Tracheal aspirate positive for Candida species. It urine pneumococcus antigen negative. Urine Legionella antigen negative. discontinued on 11/06/2016. IV Diflucan started. Oral Lasix per tube has been resumed. Patient noted to go in acute respiratory distress with sats in the 50s evening of 11/07/2016 and noted to likely be in flash pulmonary edema. Patient also noted to be in acute respiratory failure 11/09/2016 and had to be placed on the BiPAP. Patient  given IV Lasix with a urine output of 3.950 L over the past 24 hours. Patient now significantly hypernatremic with a sodium level of 153 from 155 on 11/08/2016. Increase free water flushes to 400 mL every 4 hours. Strict I's and O's. Daily weights.  Cardiology following.  #2 acute on chronic combined systolic and diastolic heart failure Likely secondary to cardiogenic shock and NSTEMI. 2-D echo with EF of 40-45%, grade 2 diastolic dysfunction, changes noted consistent with right ventricular volume and pressure overload. Patient noted to go in acute respiratory distress overnight with sats in the 50s and noted to be in flash pulmonary edema. Patient given a dose of IV Lasix and placed on the BiPAP. Patient now on 6 L nasal cannula. Patient with a urine output of 3.950 L over the past 24 hours. Patient still volume overloaded clinically on examination. Patient with worsening hypernatremia and as such Lasix was discontinued on 11/08/2016. Patient on free water. Patient noted to go into acute respiratory distress yesterday felt to be secondary to flash pulmonary edema. Chest x-ray obtained was consistent with volume overload. Physical exam consistent with volume overload. Patient given IV Lasix 60 mg 2 doses yesterday. Patient with good urine output. Patient feeling better. Will defer diuretics to cardiology as patient with significant hypernatremia. Cardiology following. Strict I's and O's. Daily weights.  #3 non-STEMI/cardiogenic shock/coronary artery disease Questionable etiology. 2-D echo with a EF of 40-45%, grade 2 diastolic dysfunction. Changes noted consistent with right ventricular volume and pressure overload. Patient status post CABG. Patient with elevated troponins consistent with non-STEMI. Patient currently chest pain-free. Per cardiology ischemic evaluation with coronary angiography should be delayed until patient is much improved and kidney function is stable.  Patient noted  to go into flash  pulmonary edema the night of 11/07/2016 and in the daytime 11/09/2016 and had to be placed on the BiPAP. Cardiac enzymes cycled elevated. Patient given a dose of IV Lasix. BNP elevated. Patient still on nitroglycerin drip and IV heparin. Continue aspirin and Lipitor. Per cardiology.  #4 hyperkalemia Likely secondary to ongoing potassium supplementation and the presence of Diovan/Bactrim in the setting of acute kidney injury. Patient was seen by nephrology were recommended medical management. Patient now hypokalemic with a potassium of 3.0. See #4. Follow.  #4 hypokalemia Likely secondary to diuresis. Replete.  #5 dysphagia Patient receiving Jevity tube feeds. Speech therapy following.  #6 hypernatremia Sodium level started to trend back up. Likely secondary to tube feeds contributing as well as. Sodium level now at  153 from 148 from 152 from 155. Patient with an acute CHF exacerbation and was being diuresed. Will increase free water to 400 mg per tube every 4 hours. Follow.   #7 cellulitis Improved. D/c Zosyn.  #8 acute metabolic encephalopathy Patient with history of Parkinson's disease, restless leg syndrome. Likely close to baseline. Continue amantadine, Sinemet, Requip.  #9 anemia of chronic disease Patient with no overt bleeding. H&H stable.  #10 acute kidney injury vs CKD Per nephrology hemodynamically mediated with possible ATN secondary to shock versus secondary to Bactrim. Baseline creatinine unknown. Renal ultrasound negative for any hydro-nephrosis. Renal function seems to be trending back up and likely secondary to acute CHF exacerbation.  #11 bilateral great toe pain/probable neuropathy Uric acid level was decreased. Patient likely with a neuropathy.  bilateafoot and toe pain pain improved on neurontin.    DVT prophylaxis: Heparin Code Status: Full Family Communication: Updated patient. No family at bedside. Disposition Plan: Remain in ICU. Disposition pending hospital  course and PT evaluation.   Consultants:   Pccm: Dr. Molli Knock 11/04/2016/Dr. Sood 11/05/2016  Nephrology: Dr. Allena Katz 10/31/2016  Cardiology: Dr. Katrinka Blazing 11/04/2016  Procedures:   CT head without contrast 11/01/2016  Chest x-ray 10/31/2016, 11/04/2016, 11/05/2016, 11/06/2016, 11/07/2016  Renal ultrasound 10/31/2016  2-D echo 11/03/2016--- EF 40-45% with grade 2 diastolic dysfunction. Right ventricular volume pressure overload. Mild to moderate mitral valvular regurgitation. Moderately dilated left HM. Moderately dilated) sugar. Right ventricular systolic function moderately reduced. Right atrium moderately dilated.  Abdominal films 11/08/2016  Antimicrobials:   Zosyn  8/31>>9/3 Unasyn 9/3>>9/4 Vanc 8/31>>8/31 Vanc 9/4>>9/06 Zosyn 9/4>>9/10 IV Diflucan 11/07/2016  SIGNIFICANT EVENTS: Intubated 8/31>> 9/2 Re-intubated 9/4>> 9/05  LINES/TUBES: femoral CVC >> 906  CULTURES: Blood 8/31 >> negative Urine 9/01 >> negative Blood 9/4>> GPC in clusters >>  Urine Strep 9/4>> Negative Urine Culture 9/4>> negative Urine Legionella 9/4>> negative Sputum Culture/tracheal aspirate 9/4>> few Candida Krusei, moderate candida out the cans   Subjective: Patient in bed currently off BiPAP and on nasal cannula 6 L. Patient denies chest pain. Patient states shortness of breath improved.   Objective: Vitals:   11/10/16 0700 11/10/16 0800 11/10/16 0900 11/10/16 1000  BP: (!) 106/50 (!) 102/49 (!) 96/48 (!) 109/49  Pulse: 88 87 83 85  Resp: 16 (!) 22 (!) 22 18  Temp:      TempSrc:      SpO2: 99% 97% 97% 97%  Weight:      Height:        Intake/Output Summary (Last 24 hours) at 11/10/16 1038 Last data filed at 11/10/16 1000  Gross per 24 hour  Intake          1339.32 ml  Output  4175 ml  Net         -2835.68 ml   Filed Weights   11/08/16 0324 11/09/16 0500 11/10/16 0350  Weight: 80.6 kg (177 lb 11.1 oz) 80.1 kg (176 lb 9.4 oz) 78.6 kg (173 lb 4.5 oz)     Examination:  General exam: Chronically ill-appearing panda intact.Marland Kitchen  Respiratory system: Diffuse crackles anterior lung fields. No wheezing. No rhonchi.  Cardiovascular system: S1 & S2 heard, RRR. No JVD, murmurs, rubs, gallops or clicks. No pedal edema. Gastrointestinal system: Abdomen is nondistended, soft and nontender. No organomegaly or masses felt. Normal bowel sounds heard. Central nervous system: Alert and oriented. No focal neurological deficits. Extremities: Venous stasis changes noted. Symmetric 5 x 5 power. Bilateral great toe pain and foot pain improving. Skin: No rashes, lesions or ulcers Psychiatry: Judgement and insight appear fair. Mood & affect appropriate.     Data Reviewed: I have personally reviewed following labs and imaging studies  CBC:  Recent Labs Lab 11/05/16 0552 11/06/16 0729 11/08/16 0740 11/09/16 0321 11/10/16 0514  WBC 7.6 11.2* 10.1 10.9* 11.3*  NEUTROABS  --   --  7.3 7.6 7.7  HGB 10.0* 10.7* 10.5* 10.0* 10.9*  HCT 32.1* 33.6* 33.0* 32.3* 34.3*  MCV 97.0 96.3 97.6 97.9 95.5  PLT 122* 120* 150 153 164   Basic Metabolic Panel:  Recent Labs Lab 11/04/16 1753 11/05/16 0552 11/06/16 0729 11/07/16 1958 11/08/16 0740 11/08/16 1356 11/09/16 0321 11/09/16 0822 11/10/16 0514  NA 146* 147* 149* 155* 155* 152* 148*  --  153*  K 3.6 3.4* 3.3* 3.5 3.2* 2.9* 3.1* 3.0* 3.0*  CL 114* 115* 114* 121* 114* 113* 110  --  115*  CO2 --  27  GLUCOSE 125* 139* 119* 131* 92 129* 135*  --  92  BUN 22* 28* 27* 33* 35* 39* 39*  --  38*  CREATININE 1.96* 1.98* 1.84* 1.81* 2.02* 2.12* 2.13*  --  2.32*  CALCIUM 7.5* 7.5* 8.0* 7.3* 7.9* 7.8* 7.6*  --  8.0*  MG 2.2 2.1 1.8  --  1.7  --   --   --  2.0  PHOS 1.8*  --   --   --   --   --   --   --   --    GFR: Estimated Creatinine Clearance: 25 mL/min (A) (by C-G formula based on SCr of 2.32 mg/dL (H)). Liver Function Tests: No results for input(s): AST, ALT, ALKPHOS, BILITOT, PROT,  ALBUMIN in the last 168 hours. No results for input(s): LIPASE, AMYLASE in the last 168 hours. No results for input(s): AMMONIA in the last 168 hours. Coagulation Profile: No results for input(s): INR, PROTIME in the last 168 hours. Cardiac Enzymes:  Recent Labs Lab 11/05/16 1107 11/06/16 0729 11/07/16 1814 11/07/16 2327 11/08/16 0740  TROPONINI 2.01* 1.29* 0.82* 1.04* 1.10*   BNP (last 3 results) No results for input(s): PROBNP in the last 8760 hours. HbA1C: No results for input(s): HGBA1C in the last 72 hours. CBG:  Recent Labs Lab 11/09/16 1605 11/09/16 1932 11/09/16 2326 11/10/16 0331 11/10/16 0819  GLUCAP 108* 112* 98 90 146*   Lipid Profile: No results for input(s): CHOL, HDL, LDLCALC, TRIG, CHOLHDL, LDLDIRECT in the last 72 hours. Thyroid Function Tests: No results for input(s): TSH, T4TOTAL, FREET4, T3FREE, THYROIDAB in the last 72 hours. Anemia Panel: No results for input(s): VITAMINB12, FOLATE, FERRITIN, TIBC, IRON, RETICCTPCT in the last 72 hours. Sepsis Labs:  No results for input(s): PROCALCITON, LATICACIDVEN in the last 168 hours.  Recent Results (from the past 240 hour(s))  Culture, blood (routine x 2)     Status: None   Collection Time: 10/31/16 10:10 PM  Result Value Ref Range Status   Specimen Description BLOOD LEFT HAND  Final   Special Requests IN PEDIATRIC BOTTLE Blood Culture adequate volume  Final   Culture NO GROWTH 5 DAYS  Final   Report Status 11/05/2016 FINAL  Final  Culture, blood (routine x 2)     Status: None   Collection Time: 10/31/16 10:20 PM  Result Value Ref Range Status   Specimen Description BLOOD RIGHT HAND  Final   Special Requests IN PEDIATRIC BOTTLE Blood Culture adequate volume  Final   Culture NO GROWTH 5 DAYS  Final   Report Status 11/05/2016 FINAL  Final  Urine culture     Status: None   Collection Time: 11/01/16 12:30 AM  Result Value Ref Range Status   Specimen Description URINE, RANDOM  Final   Special Requests  NONE  Final   Culture NO GROWTH  Final   Report Status 11/02/2016 FINAL  Final  MRSA PCR Screening     Status: None   Collection Time: 11/01/16  1:47 AM  Result Value Ref Range Status   MRSA by PCR NEGATIVE NEGATIVE Final    Comment:        The GeneXpert MRSA Assay (FDA approved for NASAL specimens only), is one component of a comprehensive MRSA colonization surveillance program. It is not intended to diagnose MRSA infection nor to guide or monitor treatment for MRSA infections.   Culture, respiratory (NON-Expectorated)     Status: None   Collection Time: 11/04/16 11:32 AM  Result Value Ref Range Status   Specimen Description TRACHEAL ASPIRATE  Final   Special Requests NONE  Final   Gram Stain   Final    MODERATE WBC PRESENT, PREDOMINANTLY PMN FEW SQUAMOUS EPITHELIAL CELLS PRESENT RARE YEAST    Culture FEW CANDIDA KRUSEI MODERATE CANDIDA ALBICANS   Final   Report Status 11/06/2016 FINAL  Final  Culture, Urine     Status: None   Collection Time: 11/04/16 12:00 PM  Result Value Ref Range Status   Specimen Description URINE, CATHETERIZED  Final   Special Requests NONE  Final   Culture NO GROWTH  Final   Report Status 11/05/2016 FINAL  Final  Culture, blood (routine x 2)     Status: None   Collection Time: 11/04/16  1:30 PM  Result Value Ref Range Status   Specimen Description BLOOD LEFT HAND  Final   Special Requests   Final    BOTTLES DRAWN AEROBIC ONLY Blood Culture adequate volume   Culture NO GROWTH 5 DAYS  Final   Report Status 11/09/2016 FINAL  Final  Culture, blood (routine x 2)     Status: Abnormal   Collection Time: 11/04/16  1:36 PM  Result Value Ref Range Status   Specimen Description BLOOD LEFT ARM  Final   Special Requests IN PEDIATRIC BOTTLE Blood Culture adequate volume  Final   Culture  Setup Time   Final    GRAM POSITIVE COCCI IN CLUSTERS IN PEDIATRIC BOTTLE CRITICAL RESULT CALLED TO, READ BACK BY AND VERIFIED WITH: A JOHNSTON 11/05/16 @ 0919 M  VESTAL    Culture (A)  Final    STAPHYLOCOCCUS SPECIES (COAGULASE NEGATIVE) THE SIGNIFICANCE OF ISOLATING THIS ORGANISM FROM A SINGLE SET OF BLOOD CULTURES WHEN MULTIPLE SETS  ARE DRAWN IS UNCERTAIN. PLEASE NOTIFY THE MICROBIOLOGY DEPARTMENT WITHIN ONE WEEK IF SPECIATION AND SENSITIVITIES ARE REQUIRED.    Report Status 11/06/2016 FINAL  Final  Blood Culture ID Panel (Reflexed)     Status: Abnormal   Collection Time: 11/04/16  1:36 PM  Result Value Ref Range Status   Enterococcus species NOT DETECTED NOT DETECTED Final   Listeria monocytogenes NOT DETECTED NOT DETECTED Final   Staphylococcus species DETECTED (A) NOT DETECTED Final    Comment: CRITICAL RESULT CALLED TO, READ BACK BY AND VERIFIED WITH: A JOHNSTON 11/05/16 @ 0919 M VESTAL    Staphylococcus aureus NOT DETECTED NOT DETECTED Final   Methicillin resistance DETECTED (A) NOT DETECTED Final    Comment: CRITICAL RESULT CALLED TO, READ BACK BY AND VERIFIED WITH: A JOHNSTON 11/05/16 @ 0919 M VESTAL    Streptococcus species NOT DETECTED NOT DETECTED Final   Streptococcus agalactiae NOT DETECTED NOT DETECTED Final   Streptococcus pneumoniae NOT DETECTED NOT DETECTED Final   Streptococcus pyogenes NOT DETECTED NOT DETECTED Final   Acinetobacter baumannii NOT DETECTED NOT DETECTED Final   Enterobacteriaceae species NOT DETECTED NOT DETECTED Final   Enterobacter cloacae complex NOT DETECTED NOT DETECTED Final   Escherichia coli NOT DETECTED NOT DETECTED Final   Klebsiella oxytoca NOT DETECTED NOT DETECTED Final   Klebsiella pneumoniae NOT DETECTED NOT DETECTED Final   Proteus species NOT DETECTED NOT DETECTED Final   Serratia marcescens NOT DETECTED NOT DETECTED Final   Haemophilus influenzae NOT DETECTED NOT DETECTED Final   Neisseria meningitidis NOT DETECTED NOT DETECTED Final   Pseudomonas aeruginosa NOT DETECTED NOT DETECTED Final   Candida albicans NOT DETECTED NOT DETECTED Final   Candida glabrata NOT DETECTED NOT DETECTED Final     Candida krusei NOT DETECTED NOT DETECTED Final   Candida parapsilosis NOT DETECTED NOT DETECTED Final   Candida tropicalis NOT DETECTED NOT DETECTED Final         Radiology Studies: Dg Chest Port 1 View  Result Date: 11/10/2016 CLINICAL DATA:  Hypoxia EXAM: PORTABLE CHEST 1 VIEW COMPARISON:  11/09/2016 FINDINGS: Multifocal patchy opacities, right upper lobe and left lower lobe predominant. While mild to moderate interstitial edema is possible, superimposed pneumonia is suspected. Possible small left pleural effusion.  No pneumothorax. Cardiomegaly. Postsurgical changes related to prior CABG. Median sternotomy. Enteric tube courses below the diaphragm. IMPRESSION: Multifocal patchy opacities, favoring pneumonia superimposed on interstitial edema, stable versus mildly increased. Possible small left pleural effusion. Electronically Signed   By: Charline Bills M.D.   On: 11/10/2016 07:11   Dg Chest Port 1v Same Day  Result Date: 11/09/2016 CLINICAL DATA:  Dyspnea EXAM: PORTABLE CHEST 1 VIEW COMPARISON:  11/07/2016 FINDINGS: Progression of right upper lobe airspace disease. Mild improvement in right lower lobe airspace disease. Mild improvement in left lower lobe airspace disease and left effusion. Vascular congestion and possible edema. IMPRESSION: Progression of right upper lobe infiltrate and improvement in right lower lobe infiltrate. Improvement in left lower lobe airspace disease. Possible edema versus pneumonia Electronically Signed   By: Marlan Palau M.D.   On: 11/09/2016 14:18        Scheduled Meds: . amantadine  100 mg Oral TID  . aspirin  81 mg Oral Daily  . atorvastatin  10 mg Oral QHS  . carbidopa-levodopa  1 tablet Oral TID  . chlorhexidine  15 mL Mouth Rinse BID  . feeding supplement (PRO-STAT SUGAR FREE 64)  30 mL Per Tube Daily  . free water  300  mL Per Tube Q4H  . gabapentin  300 mg Oral Q8H  . Gerhardt's butt cream   Topical QID  . insulin aspart  0-9 Units  Subcutaneous Q4H  . mouth rinse  15 mL Mouth Rinse q12n4p  . metoprolol tartrate  2.5 mg Intravenous Q6H  . pantoprazole sodium  40 mg Per Tube Daily  . pneumococcal 23 valent vaccine  0.5 mL Intramuscular Tomorrow-1000  . potassium chloride  40 mEq Per Tube Q4H  . primidone  50 mg Oral Q12H  . rOPINIRole  2 mg Oral Q12H   Continuous Infusions: . feeding supplement (JEVITY 1.2 CAL) 1,000 mL (11/10/16 0529)  . fluconazole (DIFLUCAN) IV 100 mg (11/10/16 0955)  . heparin 1,300 Units/hr (11/10/16 0834)  . nitroGLYCERIN 20 mcg/min (11/10/16 1023)  . piperacillin-tazobactam (ZOSYN)  IV Stopped (11/10/16 0953)     LOS: 10 days    Time spent: 40 minutes    Phuong Hillary, MD [facility] Pager [phone]  If 7PM-7AM, please contact night-coverage [email or web address] Password [email or web address] 11/10/2016, 10:38 AM

## 2016-11-10 NOTE — Progress Notes (Signed)
Modified Barium Swallow Progress Note  Patient Details  Name: Jack MiniumCharles L Fuhriman MRN: [id number] Date of Birth: 11/25/1933  Today's Date: 11/10/2016  Modified Barium Swallow completed.  Full report located under Chart Review in the Imaging Section.  Brief recommendations include the following:  Clinical Impression  Pt has a moderate oropharyngeal dysphagia likely due to intubations and deconditioning as well as current mentation. He is impulsive with his intake and his oral phase is disorganized, marked by prolonged posterior transit, premature spillage, and prolonged, inefficient mastication. Although his oral phase is prolonged, there is not any significant residue that remains in his oral cavity. Premature spillage, combined with decreased anterior hyolaryngeal movement and epiglottic deflection, allows for small amounts of silent aspiration of thin liquids before the swallow as he does not achieve complete glottal closure in time. An additional episode of silent penetration to the vocal folds occurred when pt tried to take the barium tablet with boluses of nectar thick liquids, at which time he started gagging. As he gagged, the nectar thick liquids first spilled into the laryngeal vestibule, clearing spontaneously, follwed by the barium tablet itself, which also cleared on its own. The tablet remained in his vallecula and then his pyriform sinuses until it was cleared by several boluses of puree. It appeared to remain in his esophagus, not having cleared upon brief scan. Given his fluctuating mentation and respirations thus far, recommend to start more conservatively with Dys 1 diet and nectar thick liquids with all meds crushed in puree.    Swallow Evaluation Recommendations       SLP Diet Recommendations: Dysphagia 1 (Puree) solids;Nectar thick liquid   Liquid Administration via: Straw   Medication Administration: Crushed with puree   Supervision: Staff to assist with self feeding;Full  supervision/cueing for compensatory strategies   Compensations: Minimize environmental distractions;Slow rate;Small sips/bites;Follow solids with liquid   Postural Changes: Remain semi-upright after after feeds/meals (Comment);Seated upright at 90 degrees   Oral Care Recommendations: Oral care BID   Other Recommendations: Order thickener from pharmacy;Prohibited food (jello, ice cream, thin soups);Remove water pitcher    Maxcine Hamaiewonsky, Celedonio Sortino 11/10/2016,2:24 PM   Maxcine HamLaura Paiewonsky, M.A. CCC-SLP [phone]

## 2016-11-10 NOTE — Progress Notes (Signed)
Patient is currently on 4LNC with sats of 96%. All vitals are stable and patient is in no distress. BIPAP at bedside on standby but is not needed at this time. Will continue to monitor.

## 2016-11-10 NOTE — Progress Notes (Addendum)
Advanced Heart Failure Team Consult Note   Primary Physician: Primary Cardiologist:  New   Reason for Consultation: Cardiogenic Shock  HPI:    Jack Carter is seen today for evaluation of cardiogenic shock at the request of Dr Clifton James.   Jack Carter is an 83 year old with history of CAD S/P CABG, Parkinson Disease, HTN, and hyperlipidemia . Recently relocated to [facility] from [place].   Admitted with fatigue, hyperkalemia, bradycardia and hypoxic respiratoy failure.  Required intubation but later extubated.  Troponin on admit 0.11 but up to 4.8 on 9/4. Blood and urine cultures negative. Creatinine on admit 2.9. On 9/4 he developed respiratory failure from aspiration and edema and required reintubation. Started on IV lasix and extubated on 9/5 Placed on antibiotics for cellulitis from RLE/LLE.   ECHO 11/03/2016  EF 45-50% Grade II DD RV moderately dilated. .Moderate TR.  Review of Systems: [y] = yes,  = no   General: Weight gain ; Weight loss ; Anorexia ; Fatigue [Y ]; Fever ; Chills ; Weakness   Cardiac: Chest pain/pressure ; Resting SOB ; Exertional SOB [Y ]; Orthopnea ; Pedal Edema ; Palpitations ; Syncope ; Presyncope ; Paroxysmal nocturnal dyspnea[ ]   Pulmonary: Cough ; Wheezing[ ] ; Hemoptysis[ ] ; Sputum ; Snoring   GI: Vomiting[ ] ; Dysphagia[ ] ; Melena[ ] ; Hematochezia ; Heartburn[ ] ; Abdominal pain ; Constipation ; Diarrhea ; BRBPR   GU: Hematuria[ ] ; Dysuria ; Nocturia[ ]   Vascular: Pain in legs with walking ; Pain in feet with lying flat ; Non-healing sores ; Stroke ; TIA ; Slurred speech ;  Neuro: Headaches[ ] ; Vertigo[ ] ; Seizures[ ] ; Paresthesias[ ] ;Blurred vision ; Diplopia ; Vision changes   Ortho/Skin: Arthritis ; Joint pain [Y; Muscle pain [Y ]; Joint swelling ; Back Pain [place] ]; Rash   Psych: Depression[ ] ; Anxiety[ ]   Heme: Bleeding problems ; Clotting disorders [  ]; Anemia   Endocrine: Diabetes ; Thyroid dysfunction[ ]   Home Medications Prior to Admission medications   Medication Sig Start Date End Date Taking? Authorizing Provider  amantadine (SYMMETREL) 100 MG capsule Take 100 mg by mouth 3 (three) times daily.   Yes [provider]  aspirin EC 81 MG tablet Take 81 mg by mouth daily.   Yes [provider]  atorvastatin (LIPITOR) 10 MG tablet Take 10 mg by mouth at bedtime.   Yes [provider]  carbidopa-levodopa (SINEMET IR) 25-100 MG tablet Take 1 tablet by mouth 2 (two) times daily.   Yes [provider]  carbidopa-levodopa-entacapone (STALEVO) 50-200-200 MG tablet Take 1 tablet by mouth 2 (two) times daily.   Yes [provider]  furosemide (LASIX) 40 MG tablet Take 80 mg by mouth daily.   Yes [provider]  gabapentin (NEURONTIN) 600 MG tablet Take 600 mg by mouth 4 (four) times daily.   Yes [provider]  metolazone (ZAROXOLYN) 5 MG tablet Take 5 mg by mouth every Monday, Wednesday, and Friday.   Yes [provider]  Multiple Vitamins-Minerals (CENTRUM SILVER 50+MEN) TABS Take 1 tablet by mouth daily.   Yes [provider]  omeprazole (PRILOSEC) 40 MG capsule Take 40 mg by mouth daily.   Yes [provider]  potassium chloride SA (K-DUR,KLOR-CON) 20 MEQ tablet  Take 20-40 mEq by mouth See admin instructions. 40 mEq in the morning and 20 mEq in the afternoon   Yes [provider]  primidone (MYSOLINE) 50 MG tablet Take 50 mg by mouth 2 (two) times daily.   Yes [provider]  propranolol (INDERAL) 60 MG tablet Take 60 mg by mouth 2 (two) times daily.   Yes [provider]  rOPINIRole (REQUIP) 4 MG tablet Take 4 mg by mouth 2 (two) times daily.   Yes [provider]  traMADol (ULTRAM) 50 MG tablet Take 50 mg by mouth 3 (three) times daily.   Yes [provider]  trimethobenzamide (TIGAN) 300 MG capsule  Take 300 mg by mouth 3 (three) times daily as needed for nausea/vomiting.   Yes [provider]  valsartan (DIOVAN) 160 MG tablet Take 160 mg by mouth 2 (two) times daily.   Yes [provider]    Past Medical History: Past Medical History:  Diagnosis Date  . Coronary artery disease    CABG  . Essential hypertension   . Hyperlipidemia   . Parkinson's disease (HCC)   . Restless leg syndrome     Past Surgical History: Past Surgical History:  Procedure Laterality Date  . CORONARY ARTERY BYPASS GRAFT      Family History: No family history of coronary disease.   Social History: Social History   Social History  . Marital status: Married    Spouse name: N/A  . Number of children: N/A  . Years of education: N/A   Social History Main Topics  . Smoking status: Former Games developer  . Smokeless tobacco: Never Used  . Alcohol use None  . Drug use: Unknown  . Sexual activity: Not Asked   Other Topics Concern  . None   Social History Narrative  . None    Allergies:  No Known Allergies  Objective:    Vital Signs:   Temp:  [98 F (36.7 C)-100.6 F (38.1 C)] 99.7 F (37.6 C) (09/10 0028) Pulse Rate:  [80-99] 87 (09/10 1100) Resp:  [16-33] 25 (09/10 1100) BP: (96-131)/(48-89) 101/48 (09/10 1100) SpO2:  [96 %-100 %] 97 % (09/10 1100) FiO2 (%):  [40 %] 40 % (09/10 0200) Weight:  [173 lb 4.5 oz (78.6 kg)] 173 lb 4.5 oz (78.6 kg) (09/10 0350) Last BM Date: 11/09/16  Weight change: Filed Weights   11/08/16 0324 11/09/16 0500 11/10/16 0350  Weight: 177 lb 11.1 oz (80.6 kg) 176 lb 9.4 oz (80.1 kg) 173 lb 4.5 oz (78.6 kg)    Intake/Output:   Intake/Output Summary (Last 24 hours) at 11/10/16 1320 Last data filed at 11/10/16 1100  Gross per 24 hour  Intake          1135.73 ml  Output             4175 ml  Net         -3039.27 ml      Physical Exam    General:  Elderly. Chroniically ill appearing. No resp difficulty. In bed  HEENT: normal except  [place] Neck: supple. JVP ~10 . Carotids 2+ bilat; no bruits. No lymphadenopathy or thyromegaly appreciated. Cor: PMI nondisplaced. Regular rate & rhythm. No rubs, gallops or murmurs. Lungs: Crackles throughout. on 6 liters Abdomen: soft, nontender, nondistended. No hepatosplenomegaly. No bruits or masses. Good bowel sounds. Extremities: no cyanosis, clubbing, rash, R and LLE 2+ edema Neuro: alert & orientedx3, cranial nerves grossly intact. moves all 4 extremities w/o difficulty. Affect pleasant  Telemetry   NSR 60-70s personally reviewed   EKG    Sinus Tach 117 bpm 11/10/2016   Labs   Basic Metabolic Panel:  Recent Labs Lab 11/04/16 1753 11/05/16 0552 11/06/16 0729 11/07/16 1958 11/08/16 0740 11/08/16 1356 11/09/16 0321 11/09/16 0822 11/10/16 0514  NA 146* 147* 149* 155* 155* 152* 148*  --  153*  K 3.6 3.4* 3.3* 3.5 3.2* 2.9* 3.1* 3.0* 3.0*  CL 114* 115* 114* 121* 114* 113* 110  --  115*  CO2 --  27  GLUCOSE 125* 139* 119* 131* 92 129* 135*  --  92  BUN 22* 28* 27* 33* 35* 39* 39*  --  38*  CREATININE 1.96* 1.98* 1.84* 1.81* 2.02* 2.12* 2.13*  --  2.32*  CALCIUM 7.5* 7.5* 8.0* 7.3* 7.9* 7.8* 7.6*  --  8.0*  MG 2.2 2.1 1.8  --  1.7  --   --   --  2.0  PHOS 1.8*  --   --   --   --   --   --   --   --     Liver Function Tests: No results for input(s): AST, ALT, ALKPHOS, BILITOT, PROT, ALBUMIN in the last 168 hours. No results for input(s): LIPASE, AMYLASE in the last 168 hours. No results for input(s): AMMONIA in the last 168 hours.  CBC:  Recent Labs Lab 11/05/16 0552 11/06/16 0729 11/08/16 0740 11/09/16 0321 11/10/16 0514  WBC 7.6 11.2* 10.1 10.9* 11.3*  NEUTROABS  --   --  7.3 7.6 7.7  HGB 10.0* 10.7* 10.5* 10.0* 10.9*  HCT 32.1* 33.6* 33.0* 32.3* 34.3*  MCV 97.0 96.3 97.6 97.9 95.5  PLT 122* 120* 150 153 164    Cardiac Enzymes:  Recent Labs Lab 11/05/16 1107 11/06/16 0729 11/07/16 1814 11/07/16 2327 11/08/16 0740    TROPONINI 2.01* 1.29* 0.82* 1.04* 1.10*    BNP: BNP (last 3 results)  Recent Labs  10/31/16 2202 11/06/16 0729 11/08/16 0452  BNP 205.9* 813.0* 657.0*    ProBNP (last 3 results) No results for input(s): PROBNP in the last 8760 hours.   CBG:  Recent Labs Lab 11/09/16 1932 11/09/16 2326 11/10/16 0331 11/10/16 0819 11/10/16 1220  GLUCAP 112* 98 90 146* 106*    Coagulation Studies: No results for input(s): LABPROT, INR in the last 72 hours.   Imaging   Dg Chest Port 1 View  Result Date: 11/10/2016 CLINICAL DATA:  Hypoxia EXAM: PORTABLE CHEST 1 VIEW COMPARISON:  11/09/2016 FINDINGS: Multifocal patchy opacities, right upper lobe and left lower lobe predominant. While mild to moderate interstitial edema is possible, superimposed pneumonia is suspected. Possible small left pleural effusion.  No pneumothorax. Cardiomegaly. Postsurgical changes related to prior CABG. Median sternotomy. Enteric tube courses below the diaphragm. IMPRESSION: Multifocal patchy opacities, favoring pneumonia superimposed on interstitial edema, stable versus mildly increased. Possible small left pleural effusion. Electronically Signed   By: Charline Bills M.D.   On: 11/10/2016 07:11   Dg Chest Port 1v Same Day  Result Date: 11/09/2016 CLINICAL DATA:  Dyspnea EXAM: PORTABLE CHEST 1 VIEW COMPARISON:  11/07/2016 FINDINGS: Progression of right upper lobe airspace disease. Mild improvement in right lower lobe airspace disease. Mild improvement in left lower lobe airspace disease and left effusion. Vascular congestion and possible edema. IMPRESSION: Progression of right upper lobe infiltrate and improvement in right lower lobe infiltrate. Improvement in left lower lobe airspace disease. Possible edema versus pneumonia Electronically Signed   By:  Marlan Palau M.D.   On: 11/09/2016 14:18      Medications:     Current Medications: . amantadine  100 mg Oral TID  . aspirin  81 mg Oral Daily  .  atorvastatin  10 mg Oral QHS  . carbidopa-levodopa  1 tablet Oral TID  . chlorhexidine  15 mL Mouth Rinse BID  . feeding supplement (PRO-STAT SUGAR FREE 64)  30 mL Per Tube Daily  . free water  400 mL Per Tube Q4H  . gabapentin  300 mg Oral Q8H  . Gerhardt's butt cream   Topical QID  . insulin aspart  0-9 Units Subcutaneous Q4H  . mouth rinse  15 mL Mouth Rinse q12n4p  . metoprolol tartrate  2.5 mg Intravenous Q6H  . pantoprazole sodium  40 mg Per Tube Daily  . pneumococcal 23 valent vaccine  0.5 mL Intramuscular Tomorrow-1000  . potassium chloride  40 mEq Per Tube Q4H  . primidone  50 mg Oral Q12H  . rOPINIRole  2 mg Oral Q12H     Infusions: . feeding supplement (JEVITY 1.2 CAL) 1,000 mL (11/10/16 0529)  . fluconazole (DIFLUCAN) IV Stopped (11/10/16 1055)  . heparin 1,300 Units/hr (11/10/16 0834)  . nitroGLYCERIN 10 mcg/min (11/10/16 1142)  . piperacillin-tazobactam (ZOSYN)  IV Stopped (11/10/16 1610)       Patient Profile  Jack Carter is an 83 year old with history of CAD S/P CABG, Parkinson Disease, HTN, and hyperlipidemia admitted with fatigue, hyperkalemia, bradycardia and hypoxic respiratoy failure.   Assessment/Plan   1. Acute Hypoxic Respiratory Failure secondary to aspiration and pulmonary edema Initially intubated and later extubated as he improved. Sats ok on 6 liters [place].   2. A/C Combined Systolic/Diastolic Heart Failure ECHO EF 55% Grade II DD RV moderately dilated. . 11/03/2016.  Volume overload. Continue to diurese with IV lasix.  3. Cardiogenic Shock  4. NSTEMI- Troponin peaked 4.8> 0.83>1.04>1.1 5. Cellulitis- on antibiotics.  6.  CAD : CABG in [place].  7. Hypernatremia- sodium 153 8. AKI - creatinine 2.9 on admit  9. Parakinsons Disease 10. Dysphagia- on tube feeds    Length of Stay: 10  Amy Clegg, NP  11/10/2016, 1:20 PM  Advanced Heart Failure Team Pager [phone] (M-F; 7a - 4p)  Please contact [facility] for night-coverage after hours (4p  -7a ) and weekends on [email or web address]   Patient seen and examined with Tonye Becket, NP. We discussed all aspects of the encounter. I agree with the assessment and plan as stated above.   Difficult situation. I have reviewed echo images and hospital notes personally.   83 y/o with multiple comorbidities including CAD s/p previous CABG admitted with respiratory failure, shock and hyperkalemia   Echo with EF 45-50% with inferior HK. RV dilated with normal function. PA pressure 55-28mmHG. Now extubated. Unable to provide any meaningful history. Troponin peaked at 4 (likely demand ischemia).   On exam diffuse crackles and CXR shows bilateral infiltrates, Serum sodium 153.   Weight down 20 pounds since admit. Renal function getting worse with diuresis. Albumin 3.2 on admit  I doubt HF is the major issue here. He does have some RV strain on echo in the setting of chronic lung disease and being on vent. CXR most concerning for multilobar PNA. Would stop lasix. Get non-contrast chest CT. Check d-dimer and PCT. If d-dimer markedly elevated consider VQ scan but will be limited due to infiltrates on CXR.Marland Kitchen   Could do RHC at some point to further sort  out RV strain but given his condition this would not be a priority for me now.   Primary cardiology team to continue to follow. We will circle back to check on him as well.   Arvilla MeresBensimhon, Daniel, MD  2:06 PM

## 2016-11-10 NOTE — Progress Notes (Signed)
Pt doing well on Bipap. Bipap removed and placed on n/c 6lpm. Currently O2sats 98% with n/c. Resp rate 22, hr 85 nsr.

## 2016-11-10 NOTE — Progress Notes (Signed)
ANTICOAGULATION CONSULT NOTE  Pharmacy Consult for heparin Indication: chest pain/ACS  No Known Allergies  Patient Measurements: Height: 5\' 9"  (175.3 cm) Weight: 173 lb 4.5 oz (78.6 kg) IBW/kg (Calculated) : 70.7 Heparin Dosing Weight: 83 kg  Vital Signs: Temp: 99.7 F (37.6 C) (09/10 0028) Temp Source: Oral (09/10 0028) BP: 107/48 (09/10 0600) Pulse Rate: 96 (09/10 0600)  Labs:  Recent Labs  11/07/16 1814  11/07/16 2327  11/08/16 0740 11/08/16 1356 11/09/16 0321 11/09/16 1309 11/10/16 0514  HGB  --   --   --   --  10.5*  --  10.0*  --   --   HCT  --   --   --   --  33.0*  --  32.3*  --   --   PLT  --   --   --   --  150  --  153  --   --   HEPARINUNFRC  --   --   --   < >  --  0.33 0.23* 0.36 0.36  CREATININE  --   < >  --   --  2.02* 2.12* 2.13*  --  2.32*  TROPONINI 0.82*  --  1.04*  --  1.10*  --   --   --   --   < > = values in this interval not displayed.  Estimated Creatinine Clearance: 25 mL/min (A) (by C-G formula based on SCr of 2.32 mg/dL (H)).  Assessment: 83yoM with new EKG changes. Possible NSTEMI  Heparin level is therapeutic at 0.36. CBC stable and no s/s bleeding noted.   Goal of Therapy:  Heparin level 0.3-0.7 units/ml Monitor platelets by anticoagulation protocol: Yes   Plan:  Continue heparin gtt 1300 units/hr Daily heparin level and CBC Monitor for s/s bleeding   Isaac BlissMichael Carold Eisner, PharmD, BCPS, BCCCP Clinical Pharmacist Clinical phone for 11/10/2016 from 7a-3:30p: [phone] If after 3:30p, please call main pharmacy at: [phone] 11/10/2016 7:30 AM

## 2016-11-10 NOTE — Progress Notes (Signed)
Physical Therapy Treatment Patient Details Name: Jack Carter MRN: [id number] DOB: Jun 17, 1933 Today's Date: 11/10/2016    History of Present Illness Pt admitted 10/31/16 after falling with LOC with cardiogenic shock, severe hypokalemia, AKI for rhabdo, acute hypoxic respiratory failure requiring intubation, extubated 11/05/16. PMH: CAD with CABG, CHF, R LE cellulitis, Parkinsons. Pt recently moved from [place] to his daughter's home.    PT Comments    Patient progressing slowly towards PT goals. Tolerated standing and taking 2 steps along side HOB with max A for support/safety due to bil knees buckling and posterior lean. Able to sit EOB unsupported for the most part. Pt fearful of falling. Pain seems improved from prior session allowing for better participation. Will continue to follow and progress.  Follow Up Recommendations  SNF;Supervision for mobility/OOB     Equipment Recommendations  None recommended by PT    Recommendations for Other Services       Precautions / Restrictions Precautions Precautions: Fall Precaution Comments: watch BP Restrictions Weight Bearing Restrictions: No    Mobility  Bed Mobility Overal bed mobility: Needs Assistance Bed Mobility: Rolling;Sidelying to Sit;Sit to Sidelying Rolling: Mod assist;+2 for physical assistance Sidelying to sit: +2 for physical assistance;HOB elevated;Max assist     Sit to sidelying: Mod assist;+2 for physical assistance General bed mobility comments: Step by step cues for sequencing to reach towards rail with LUE; assist with LEs, scooting bottom and to elevate trunk. No dizzness.  Transfers Overall transfer level: Needs assistance Equipment used: 2 person hand held assist Transfers: Sit to/from Stand Sit to Stand: Mod assist;+2 physical assistance         General transfer comment: Assist of 2 to power to standing with cues for anterior translation. Pt with posterior lean in  hips.  Ambulation/Gait Ambulation/Gait assistance: Max assist;+2 physical assistance Ambulation Distance (Feet): 2 Feet Assistive device: 2 person hand held assist Gait Pattern/deviations: Wide base of support;Step-to pattern Gait velocity: decreased   General Gait Details: Able to side step along side bed x2 feet with bil knee buckling and posterior lean.    Stairs            Wheelchair Mobility    Modified Rankin (Stroke Patients Only)       Balance Overall balance assessment: Needs assistance Sitting-balance support: Feet supported;Bilateral upper extremity supported Sitting balance-Leahy Scale: Poor Sitting balance - Comments: Able to sit EOB with Min guard assist-Min A for support; BUE needed for support.  Postural control: Posterior lean Standing balance support: During functional activity Standing balance-Leahy Scale: Poor Standing balance comment: Requires Min A for static standing balance and Max A of 2 for dynamic standing due to Bil knee buckling.                            Cognition Arousal/Alertness: Awake/alert Behavior During Therapy: Anxious Overall Cognitive Status: Impaired/Different from baseline Area of Impairment: Orientation;Attention;Memory;Following commands;Problem solving                 Orientation Level: Disoriented to;Situation ("Sept" "2018" "hospital") Current Attention Level: Selective Memory: Decreased short-term memory Following Commands: Follows one step commands with increased time     Problem Solving: Slow processing;Decreased initiation;Difficulty sequencing;Requires verbal cues;Requires tactile cues General Comments: Poor short term memory within session. Gets easily worked up.      Exercises General Exercises - Lower Extremity Long Arc Quad: Both;5 reps;Seated    General Comments General comments (skin integrity, edema, etc.): Supine  BP 110/50, sitting BP 92/51, supine BP 101/51.      Pertinent  Vitals/Pain Pain Assessment: Faces Faces Pain Scale: No hurt    Home Living                      Prior Function            PT Goals (current goals can now be found in the care plan section) Progress towards PT goals: Progressing toward goals    Frequency    Min 3X/week      PT Plan Current plan remains appropriate    Co-evaluation              AM-PAC PT "6 Clicks" Daily Activity  Outcome Measure  Difficulty turning over in bed (including adjusting bedclothes, sheets and blankets)?: Unable Difficulty moving from lying on back to sitting on the side of the bed? : Unable Difficulty sitting down on and standing up from a chair with arms (e.g., wheelchair, bedside commode, etc,.)?: Unable Help needed moving to and from a bed to chair (including a wheelchair)?: A Lot Help needed walking in hospital room?: A Lot Help needed climbing 3-5 steps with a railing? : Total 6 Click Score: 8    End of Session Equipment Utilized During Treatment: Oxygen Activity Tolerance: Patient tolerated treatment well Patient left: in bed;with call bell/phone within reach Nurse Communication: Mobility status PT Visit Diagnosis: Muscle weakness (generalized) (M62.81);Difficulty in walking, not elsewhere classified (R26.2)     Time: [id number] PT Time Calculation (min) (ACUTE ONLY): 28 min  Charges:  $Therapeutic Activity: 23-37 mins                    G Codes:       Mylo RedShauna Moataz Tavis, PT, DPT [phone]     Blake DivineShauna A Angie Piercey 11/10/2016, 3:07 PM

## 2016-11-10 NOTE — Progress Notes (Signed)
  Speech Language Pathology Treatment: Dysphagia  Patient Details Name: Jack MiniumCharles L Climer MRN: [id number] DOB: 06/14/1933 Today's Date: 11/10/2016 Time: [id number] SLP Time Calculation (min) (ACUTE ONLY): 12 min  Assessment / Plan / Recommendation Clinical Impression  Pt's RR remains in the 20s throughout PO trials and his voice seems strong, although with mild roughness. He seems calmer and has improved sustained attention and command following as compared to this SLP's previous visits. No overt s/s of aspiration were observed with ice chip trials, although he did have one immediate cough following his initial bite of applesauce. He seems ready for instrumental testing - would remain NPO until this is completed.   HPI HPI: 83 yr old male with Hx of parkinson disease, CAD s/p CABG, CHF who presented with 1 week history of weakness and fall at home, was unresponsive and required intubation. Admitted with septic and cardiogenic shock secondary to cellulitis and hyperkalemia. Severe hyperkalemia from AKI, use of K and bactrim. Severe bradycardia from hyperkalemia. Acute hypoxemic respiratory failure. Severe metabolic encephalopathy. Intubated 8/31-9/2. He was assessed by SLP after extubation wtih FEES recommending NPO status. He was then reintubated 9/4-9/5.      SLP Plan  MBS       Recommendations  Diet recommendations: NPO Medication Administration: Via alternative means                Oral Care Recommendations: Oral care QID Follow up Recommendations: Skilled Nursing facility SLP Visit Diagnosis: Dysphagia, oropharyngeal phase (R13.12) Plan: MBS       GO                Maxcine Hamaiewonsky, [place] 11/10/2016, 9:40 AM  Maxcine HamLaura Paiewonsky, M.A. CCC-SLP [phone]

## 2016-11-11 ENCOUNTER — Inpatient Hospital Stay (HOSPITAL_COMMUNITY): Payer: Medicare Other

## 2016-11-11 DIAGNOSIS — I5043 Acute on chronic combined systolic (congestive) and diastolic (congestive) heart failure: Secondary | ICD-10-CM

## 2016-11-11 DIAGNOSIS — R0600 Dyspnea, unspecified: Secondary | ICD-10-CM

## 2016-11-11 DIAGNOSIS — R001 Bradycardia, unspecified: Secondary | ICD-10-CM

## 2016-11-11 LAB — BASIC METABOLIC PANEL
Anion gap: 8 (ref 5–15)
BUN: 38 mg/dL — ABNORMAL HIGH (ref 6–20)
CHLORIDE: 113 mmol/L — AB (ref 101–111)
CO2: 28 mmol/L (ref 22–32)
CREATININE: 2.38 mg/dL — AB (ref 0.61–1.24)
Calcium: 7.9 mg/dL — ABNORMAL LOW (ref 8.9–10.3)
GFR calc non Af Amer: 24 mL/min — ABNORMAL LOW (ref >60)
GFR, EST AFRICAN AMERICAN: 28 mL/min — AB (ref >60)
Glucose, Bld: 98 mg/dL (ref 65–99)
POTASSIUM: 3.1 mmol/L — AB (ref 3.5–5.1)
SODIUM: 149 mmol/L — AB (ref 135–145)

## 2016-11-11 LAB — GLUCOSE, CAPILLARY
GLUCOSE-CAPILLARY: 121 mg/dL — AB (ref 65–99)
GLUCOSE-CAPILLARY: 124 mg/dL — AB (ref 65–99)
GLUCOSE-CAPILLARY: 87 mg/dL (ref 65–99)
GLUCOSE-CAPILLARY: 91 mg/dL (ref 65–99)
Glucose-Capillary: 88 mg/dL (ref 65–99)
Glucose-Capillary: 86 mg/dL (ref 65–99)

## 2016-11-11 LAB — CBC WITH DIFFERENTIAL/PLATELET
Basophils Absolute: 0.1 10*3/uL (ref 0.0–0.1)
Basophils Relative: 1 %
Eosinophils Absolute: 1 10*3/uL — ABNORMAL HIGH (ref 0.0–0.7)
Eosinophils Relative: 8 %
HCT: 33.5 % — ABNORMAL LOW (ref 39.0–52.0)
Hemoglobin: 10.2 g/dL — ABNORMAL LOW (ref 13.0–17.0)
Lymphocytes Relative: 17 %
Lymphs Abs: 2.2 10*3/uL (ref 0.7–4.0)
MCH: 29.9 pg (ref 26.0–34.0)
MCHC: 30.4 g/dL (ref 30.0–36.0)
MCV: 98.2 fL (ref 78.0–100.0)
Monocytes Absolute: 0.8 10*3/uL (ref 0.1–1.0)
Monocytes Relative: 6 %
Neutro Abs: 8.8 10*3/uL — ABNORMAL HIGH (ref 1.7–7.7)
Neutrophils Relative %: 68 %
Platelets: 195 10*3/uL (ref 150–400)
RBC: 3.41 MIL/uL — ABNORMAL LOW (ref 4.22–5.81)
RDW: 16.9 % — ABNORMAL HIGH (ref 11.5–15.5)
WBC: 12.9 10*3/uL — ABNORMAL HIGH (ref 4.0–10.5)

## 2016-11-11 LAB — MAGNESIUM: MAGNESIUM: 2.1 mg/dL (ref 1.7–2.4)

## 2016-11-11 LAB — EXPECTORATED SPUTUM ASSESSMENT W GRAM STAIN, RFLX TO RESP C

## 2016-11-11 LAB — EXPECTORATED SPUTUM ASSESSMENT W REFEX TO RESP CULTURE

## 2016-11-11 LAB — PATHOLOGIST SMEAR REVIEW

## 2016-11-11 LAB — HEPARIN LEVEL (UNFRACTIONATED)
HEPARIN UNFRACTIONATED: 0.49 [IU]/mL (ref 0.30–0.70)
Heparin Unfractionated: 0.29 IU/mL — ABNORMAL LOW (ref 0.30–0.70)

## 2016-11-11 LAB — PROCALCITONIN: PROCALCITONIN: 0.39 ng/mL

## 2016-11-11 MED ORDER — TECHNETIUM TC 99M DIETHYLENETRIAME-PENTAACETIC ACID
30.0000 | Freq: Once | INTRAVENOUS | Status: AC | PRN
  Administered 2016-11-11: 30 via INTRAVENOUS

## 2016-11-11 MED ORDER — GABAPENTIN 250 MG/5ML PO SOLN
300.0000 mg | Freq: Three times a day (TID) | ORAL | Status: DC
  Administered 2016-11-11 – 2016-11-18 (×22): 300 mg
  Filled 2016-11-11 (×27): qty 6

## 2016-11-11 MED ORDER — TECHNETIUM TO 99M ALBUMIN AGGREGATED
4.0000 | Freq: Once | INTRAVENOUS | Status: AC | PRN
  Administered 2016-11-11: 4 via INTRAVENOUS

## 2016-11-11 MED ORDER — POTASSIUM CHLORIDE 20 MEQ/15ML (10%) PO SOLN
40.0000 meq | ORAL | Status: AC
  Administered 2016-11-11 (×2): 40 meq
  Filled 2016-11-11: qty 30

## 2016-11-11 MED ORDER — POTASSIUM CHLORIDE 20 MEQ/15ML (10%) PO SOLN
40.0000 meq | Freq: Once | ORAL | Status: DC
  Filled 2016-11-11: qty 30

## 2016-11-11 NOTE — Progress Notes (Signed)
ANTICOAGULATION CONSULT NOTE  Pharmacy Consult for heparin Indication: chest pain/ACS  No Known Allergies  Patient Measurements: Height: 5\' 9"  (175.3 cm) Weight: 171 lb 11.8 oz (77.9 kg) IBW/kg (Calculated) : 70.7 Heparin Dosing Weight: 83 kg  Vital Signs: Temp: 98.7 F (37.1 C) (09/11 0728) Temp Source: Oral (09/11 0728) BP: 114/53 (09/11 1200) Pulse Rate: 82 (09/11 1200)  Labs:  Recent Labs  11/09/16 0321  11/10/16 0514 11/11/16 0153 11/11/16 1129  HGB 10.0*  --  10.9* 10.2*  --   HCT 32.3*  --  34.3* 33.5*  --   PLT 153  --  164 195  --   HEPARINUNFRC 0.23*  < > 0.36 0.29* 0.49  CREATININE 2.13*  --  2.32* 2.38*  --   < > = values in this interval not displayed.  Estimated Creatinine Clearance: 24.3 mL/min (A) (by C-G formula based on SCr of 2.38 mg/dL (H)).  Assessment: 83yoM with new EKG changes. Possible NSTEMI  Heparin level is therapeutic at 0.49. CBC stable and no s/s bleeding noted.   Goal of Therapy:  Heparin level 0.3-0.7 units/ml Monitor platelets by anticoagulation protocol: Yes   Plan:  Continue heparin gtt 1400 units/hr Daily heparin level and CBC Monitor for s/s bleeding   Jack BlissMichael Deshanti Adcox, PharmD, BCPS, BCCCP Clinical Pharmacist Clinical phone for 11/11/2016 from 7a-3:30p: [phone] If after 3:30p, please call main pharmacy at: [phone] 11/11/2016 1:35 PM

## 2016-11-11 NOTE — Clinical Social Work Note (Signed)
Clinical Social Work Assessment  Patient Details  Name: Jack Carter MRN: [id number] Date of Birth: 1934-01-26  Date of referral:  11/11/16               Reason for consult:  Facility Placement                Permission sought to share information with:  Family Supports Permission granted to share information::  Yes, Verbal Permission Granted  Name::     Rhae Lerner  (pt's daughter)  Agency::     Relationship::     Contact Information:     Housing/Transportation Living arrangements for the past 2 months:  Single Family Home (pt is from [place] where pt lived alone. ) Source of Information:  Patient, Adult Children (daughter [name].) Patient Interpreter Needed:  None Criminal Activity/Legal Involvement Pertinent to Current Situation/Hospitalization:  No - Comment as needed Significant Relationships:  Adult Children Lives with:  Adult Children Do you feel safe going back to the place where you live?  Yes Need for family participation in patient care:  Yes (Comment)  Care giving concerns:  CSW spoke with pt at bedside. During this time CSW had trouble understanding pt as pt words are unclear. CSW asked pt if CSW could speak with pt's daughter [name] to gather information and pt was agreeable to this. After speaking with [name] CSW was made aware that the concerns is pt's inability to care for self any longer.    Social Worker assessment / plan:  CSW spoke with pt at bedside and spoke with pt's daughter [name] via phone. [phone]. During this time CSW was informed that pt is from [place] where pt lived alone. Pt told CSW that pt has lived alone for some time now. Pt's daughter informed CSW that she moved pt here because pt was no longer able to care for self while in [place].   CSW was notified that supports for pt include pt's daughter at this time. CSW informed both pt and pt's daughter that PT is suggesting that pt get therapy at the time of discharge. CSW was told by [name] that  she will not be placing her father in a SNF no matter what, one reason why she brought him closer to her. Pt's daughter asked about HH and mentioned that pt is already receiving services from [facility]. CSW told [name] that she would have RN CM to contact [facility] regarding HH options as CSW is not responsible for that segment of care.  CSW provided RN Cm with contact information for [facility].   Employment status:  Retired Health and safety inspector:  Medicare PT Recommendations:  Skilled Nursing Facility Information / Referral to community resources:  Skilled Nursing Facility  Patient/Family's Response to care: Pt and pt's daughter are understanding and still determining needs of care for pt at this time. Pt's daughter is not agreeable to SNF at this time and is seeking more information on [facility].   Patient/Family's Understanding of and Emotional Response to Diagnosis, Current Treatment, and Prognosis:  No further questions or concerns have been presented to CSW at this time.    Emotional Assessment Appearance:  Appears stated age Attitude/Demeanor/Rapport:    Affect (typically observed):  Pleasant Orientation:  Oriented to Self, Oriented to Place, Oriented to  Time, Oriented to Situation (pt's speech is a little unclear making it harder to understand pt. ) Alcohol / Substance use:  Not Applicable Psych involvement (Current and /or in the community):     Discharge Needs  Concerns to be addressed:  No discharge needs identified Readmission within the last 30 days:  No Current discharge risk:  None Barriers to Discharge:  No Barriers Identified   Robb MatarKierra S Eugean Arnott, LCSWA 11/11/2016, 10:58 AM

## 2016-11-11 NOTE — Progress Notes (Signed)
PROGRESS NOTE    Jack Carter  ZOX:[id number] DOB: Apr 28, 1933 DOA: 10/31/2016 PCP: No primary care provider on file.    Brief Narrative: Patient is a 83 year old gentleman who was admitted with generalized weakness with septic shock with cellulitis. It was noted during the hospitalization to have cardiogenic shock with bradycardia hypotension hyperkalemia from Bactrim. Patient required reintubation and successfully extubated and transferred out the ICU. Patient return back to the ICU on 11/04/2016 with respiratory failure likely secondary to aspiration of acute pulmonary edema. Patient was on the critical care service. Patient subsequently extubated and transferred to the hospitalist service. Cardiology following.   Assessment & Plan:   Principal Problem:   Cardiogenic shock (HCC) Active Problems:   Septic shock (HCC)   Cellulitis   Hyperkalemia   AKI (acute kidney injury) (HCC)   Acute hypoxemic respiratory failure (HCC)   CAD (coronary artery disease) of artery bypass graft   CHF (congestive heart failure) (HCC)   Parkinson disease (HCC)   Upper GI bleed   Elevated troponin   Acute encephalopathy   Goals of care, counseling/discussion   Aspiration into airway   Hypernatremia   Great toe pain, unspecified laterality   Acute respiratory failure (HCC)   Coronary artery disease involving native coronary artery of native heart without angina pectoris   Acute on chronic combined systolic and diastolic CHF (congestive heart failure) (HCC)  #1 acute hypoxia respiratory failure secondary to aspiration and pulmonary edema/CHF exacerbation Patient extubated and speaking in full sentences. Chest x-ray from 11/06/2016 consistent with volume overload. Tracheal aspirate positive for Candida species. It urine pneumococcus antigen negative. Urine Legionella antigen negative. discontinued on 11/06/2016. IV Diflucan started. Oral Lasix per tube has been resumed. Patient noted to go in acute  respiratory distress with sats in the 50s evening of 11/07/2016 and noted to likely be in flash pulmonary edema. Patient also noted to be in acute respiratory failure 11/09/2016 and had to be placed on the BiPAP. Patient given IV Lasix which has subsequently been discontinued per cardiology. Urine output of 1.3 L over the past 24 hours.Patient now significantly hypernatremic with a sodium level of 149 from 153 from 155 on 11/08/2016. Continue free water flushes to 400 mL every 4 hours. Strict I's and O's. Daily weights. CT chest without contrast which was done did show bilateral extensive airspace disease in the upper lobes concerning for multifocal pneumonia and/or edema. D-dimer elevated. Patient status post 10 days IV antibiotics, currently afebrile.if infectious etiology. Patient also on IV Diflucan as tracheal aspirate did grow Candida species. Continue IV Diflucan. Will check a VQ scan. Cardiology following.  #2 acute on chronic combined systolic and diastolic heart failure Likely secondary to cardiogenic shock and NSTEMI. 2-D echo with EF of 40-45%, grade 2 diastolic dysfunction, changes noted consistent with right ventricular volume and pressure overload. Patient noted to go in acute respiratory distress overnight with sats in the 50s and noted to be in flash pulmonary edema. Patient given a dose of IV Lasix and placed on the BiPAP. Patient now on 4 L nasal cannula. Patient with a urine output of 1.3 L over the past 24 hours. Patient still volume overloaded clinically on examination. Patient with worsening hypernatremia and as such Lasix was discontinued on 11/08/2016. Patient on free water. Patient noted to go into acute respiratory distress yesterday felt to be secondary to flash pulmonary edema. Chest x-ray obtained was consistent with volume overload. Physical exam consistent with volume overload. Patient given IV Lasix 60 mg  2 doses 11/09/2016. Patient with good urine output. Patient feeling better.  CT chest without contrast done per heart failure team which showed extensive bilateral disease with predominance of the upper lobes could represent either multifocal pneumonia and/or edema. D-dimer elevated. Check a VQ scan. Diuretics on hold per cardiology. Patient may need a right heart however timing per cardiology. Cardiology following. Strict I's and O's. Daily weights.  #3 non-STEMI/cardiogenic shock/coronary artery disease Questionable etiology. 2-D echo with a EF of 40-45%, grade 2 diastolic dysfunction. Changes noted consistent with right ventricular volume and pressure overload. Patient status post CABG. Patient with elevated troponins consistent with non-STEMI. Patient currently chest pain-free. Per cardiology ischemic evaluation with coronary angiography should be delayed until patient is much improved and kidney function is stable.  Patient noted to go into flash pulmonary edema the night of 11/07/2016 and in the daytime 11/09/2016 and had to be placed on the BiPAP. Cardiac enzymes cycled elevated. Patient given a dose of IV Lasix. BNP elevated. Patient still on nitroglycerin drip and IV heparin. Continue aspirin and Lipitor. Per cardiology.  #4 hyperkalemia Likely secondary to ongoing potassium supplementation and the presence of Diovan/Bactrim in the setting of acute kidney injury. Patient was seen by nephrology were recommended medical management. Patient now hypokalemic with a potassium of 3.1. See #4. Follow.  #4 hypokalemia Likely secondary to diuresis. Replete.  #5 dysphagia Patient receiving Jevity tube feeds. Patient underwent modified barium swallow and patient currently none dysphagia 1 diet with nectar thick liquids. Monitor closely. Once patient is able to adequately receive nutrition from oral diet will discontinue tube feeds.   #6 hypernatremia Sodium level started to trend back up. Likely secondary to tube feeds contributing as well as dehydration. Sodium level now at 149  from 153 from 148 from 152 from 155. Patient with an acute CHF exacerbation and was being diuresed. Diuretics have been discontinued per cardiology. Continue free water to 400 mg per tube every 4 hours. Patient also started on a diet and when patient able to get adequate nutrition from his diet will discontinue tube feeds. Follow.   #7 cellulitis Improved. Zosyn has been discontinued. Patient status post 10 days of antibiotics.  #8 acute metabolic encephalopathy Patient with history of Parkinson's disease, restless leg syndrome. Likely close to baseline. Continue amantadine, Sinemet, Requip.  #9 anemia of chronic disease Patient with no overt bleeding. H&H stable.  #10 acute kidney injury vs CKD Per nephrology hemodynamically mediated with possible ATN secondary to shock versus secondary to Bactrim. Baseline creatinine unknown. Renal ultrasound negative for any hydro-nephrosis. Renal function seems to be trending back up with diuresis. Lasix on hold per cardiology. Follow.   #11 bilateral great toe pain/probable neuropathy Uric acid level was decreased. Patient likely with a neuropathy. Foot pain improved with resumption of Neurontin.    DVT prophylaxis: Heparin Code Status: Full Family Communication: Updated patient. No family at bedside. Disposition Plan: Remain in ICU. Disposition pending hospital course and PT evaluation.   Consultants:   Pccm: Dr. Molli Knock 11/04/2016/Dr. Sood 11/05/2016  Nephrology: Dr. Allena Katz 10/31/2016  Cardiology: Dr. Katrinka Blazing 11/04/2016  Heart failure team Dr.Bensimhon 11/10/2016  Procedures:   CT head without contrast 11/01/2016  Chest x-ray 10/31/2016, 11/04/2016, 11/05/2016, 11/06/2016, 11/07/2016  Renal ultrasound 10/31/2016  2-D echo 11/03/2016--- EF 40-45% with grade 2 diastolic dysfunction. Right ventricular volume pressure overload. Mild to moderate mitral valvular regurgitation. Moderately dilated left HM. Moderately dilated) sugar. Right  ventricular systolic function moderately reduced. Right atrium moderately dilated.  Abdominal  films 11/08/2016  CT chest without contrast 11/10/2016----extensive bilateral airspace disease with an upper lobe predominance can be due to multifocal pneumonia and or edema. Distribution of airspace disease is atypical for aspiration. Small left and small to moderate right pleural effusion. Hiatal hernia.  Antimicrobials:   Zosyn  8/31>>9/3 Unasyn 9/3>>9/4 Vanc 8/31>>8/31 Vanc 9/4>>9/06 Zosyn 9/4>>9/10 IV Diflucan 11/07/2016  SIGNIFICANT EVENTS: Intubated 8/31>> 9/2 Re-intubated 9/4>> 9/05  LINES/TUBES: femoral CVC >> 906  CULTURES: Blood 8/31 >> negative Urine 9/01 >> negative Blood 9/4>> GPC in clusters >>  Urine Strep 9/4>> Negative Urine Culture 9/4>> negative Urine Legionella 9/4>> negative Sputum Culture/tracheal aspirate 9/4>> few Candida Krusei, moderate candida out the cans   Subjective: Patient in bed currently on 4 L nasal cannula. Off BiPAP. Patient denies chest pain. Patient denies shortness of breath. Patient asking when he'll be transferred out of the unit.   Objective: Vitals:   11/11/16 0743 11/11/16 0800 11/11/16 0900 11/11/16 1000  BP: (!) 112/50 (!) 110/43 (!) 100/52 (!) 114/102  Pulse: 86 81 86 79  Resp: (!) 25 (!) 28 (!) 22 (!) 21  Temp:      TempSrc:      SpO2: 93% 95% 95% 97%  Weight:      Height:        Intake/Output Summary (Last 24 hours) at 11/11/16 1123 Last data filed at 11/11/16 0904  Gross per 24 hour  Intake          2480.87 ml  Output             2180 ml  Net           300.87 ml   Filed Weights   11/09/16 0500 11/10/16 0350 11/11/16 0421  Weight: 80.1 kg (176 lb 9.4 oz) 78.6 kg (173 lb 4.5 oz) 77.9 kg (171 lb 11.8 oz)    Examination:  General exam: Chronically ill-appearing panda intact.Sleeping.Marland Kitchen  Respiratory system: Diffuse crackles/coarse BS anterior lung fields. No wheezing.  Cardiovascular system: S1 & S2 heard, RRR. No  JVD, murmurs, rubs, gallops or clicks. No pedal edema. Gastrointestinal system: Abdomen is nondistended, soft and nontender. No organomegaly or masses felt. Normal bowel sounds heard. Central nervous system: sleeping however easily arousable. No focal neurological deficits. Extremities: Venous stasis changes noted. Symmetric 5 x 5 power. Bilateral great toe pain and foot pain improved. Skin: No rashes, lesions or ulcers Psychiatry: Judgement and insight appear fair. Mood & affect appropriate.     Data Reviewed: I have personally reviewed following labs and imaging studies  CBC:  Recent Labs Lab 11/06/16 0729 11/08/16 0740 11/09/16 0321 11/10/16 0514 11/11/16 0153  WBC 11.2* 10.1 10.9* 11.3* 12.9*  NEUTROABS  --  7.3 7.6 7.7 8.8*  HGB 10.7* 10.5* 10.0* 10.9* 10.2*  HCT 33.6* 33.0* 32.3* 34.3* 33.5*  MCV 96.3 97.6 97.9 95.5 98.2  PLT 120* 150 153 164 195   Basic Metabolic Panel:  Recent Labs Lab 11/04/16 1753 11/05/16 0552 11/06/16 0729  11/08/16 0740 11/08/16 1356 11/09/16 0321 11/09/16 0822 11/10/16 0514 11/11/16 0153  NA 146* 147* 149*  < > 155* 152* 148*  --  153* 149*  K 3.6 3.4* 3.3*  < > 3.2* 2.9* 3.1* 3.0* 3.0* 3.1*  CL 114* 115* 114*  < > 114* 113* 110  --  115* 113*  CO2 < > --  27 28  GLUCOSE 125* 139* 119*  < > 92 129* 135*  --  92 98  BUN 22* 28* 27*  < > 35* 39* 39*  --  38* 38*  CREATININE 1.96* 1.98* 1.84*  < > 2.02* 2.12* 2.13*  --  2.32* 2.38*  CALCIUM 7.5* 7.5* 8.0*  < > 7.9* 7.8* 7.6*  --  8.0* 7.9*  MG 2.2 2.1 1.8  --  1.7  --   --   --  2.0 2.1  PHOS 1.8*  --   --   --   --   --   --   --   --   --   < > = values in this interval not displayed. GFR: Estimated Creatinine Clearance: 24.3 mL/min (A) (by C-G formula based on SCr of 2.38 mg/dL (H)). Liver Function Tests: No results for input(s): AST, ALT, ALKPHOS, BILITOT, PROT, ALBUMIN in the last 168 hours. No results for input(s): LIPASE, AMYLASE in the last 168 hours. No  results for input(s): AMMONIA in the last 168 hours. Coagulation Profile: No results for input(s): INR, PROTIME in the last 168 hours. Cardiac Enzymes:  Recent Labs Lab 11/05/16 1107 11/06/16 0729 11/07/16 1814 11/07/16 2327 11/08/16 0740  TROPONINI 2.01* 1.29* 0.82* 1.04* 1.10*   BNP (last 3 results) No results for input(s): PROBNP in the last 8760 hours. HbA1C: No results for input(s): HGBA1C in the last 72 hours. CBG:  Recent Labs Lab 11/10/16 1615 11/10/16 1939 11/10/16 2328 11/11/16 0305 11/11/16 0727  GLUCAP 151* 116* 119* 91 87   Lipid Profile: No results for input(s): CHOL, HDL, LDLCALC, TRIG, CHOLHDL, LDLDIRECT in the last 72 hours. Thyroid Function Tests: No results for input(s): TSH, T4TOTAL, FREET4, T3FREE, THYROIDAB in the last 72 hours. Anemia Panel: No results for input(s): VITAMINB12, FOLATE, FERRITIN, TIBC, IRON, RETICCTPCT in the last 72 hours. Sepsis Labs:  Recent Labs Lab 11/10/16 1816 11/11/16 0153  PROCALCITON 0.44 0.39    Recent Results (from the past 240 hour(s))  Culture, respiratory (NON-Expectorated)     Status: None   Collection Time: 11/04/16 11:32 AM  Result Value Ref Range Status   Specimen Description TRACHEAL ASPIRATE  Final   Special Requests NONE  Final   Gram Stain   Final    MODERATE WBC PRESENT, PREDOMINANTLY PMN FEW SQUAMOUS EPITHELIAL CELLS PRESENT RARE YEAST    Culture FEW CANDIDA KRUSEI MODERATE CANDIDA ALBICANS   Final   Report Status 11/06/2016 FINAL  Final  Culture, Urine     Status: None   Collection Time: 11/04/16 12:00 PM  Result Value Ref Range Status   Specimen Description URINE, CATHETERIZED  Final   Special Requests NONE  Final   Culture NO GROWTH  Final   Report Status 11/05/2016 FINAL  Final  Culture, blood (routine x 2)     Status: None   Collection Time: 11/04/16  1:30 PM  Result Value Ref Range Status   Specimen Description BLOOD LEFT HAND  Final   Special Requests   Final    BOTTLES DRAWN  AEROBIC ONLY Blood Culture adequate volume   Culture NO GROWTH 5 DAYS  Final   Report Status 11/09/2016 FINAL  Final  Culture, blood (routine x 2)     Status: Abnormal   Collection Time: 11/04/16  1:36 PM  Result Value Ref Range Status   Specimen Description BLOOD LEFT ARM  Final   Special Requests IN PEDIATRIC BOTTLE Blood Culture adequate volume  Final   Culture  Setup Time   Final    GRAM POSITIVE COCCI IN CLUSTERS IN PEDIATRIC  BOTTLE CRITICAL RESULT CALLED TO, READ BACK BY AND VERIFIED WITH: A JOHNSTON 11/05/16 @ 0919 M VESTAL    Culture (A)  Final    STAPHYLOCOCCUS SPECIES (COAGULASE NEGATIVE) THE SIGNIFICANCE OF ISOLATING THIS ORGANISM FROM A SINGLE SET OF BLOOD CULTURES WHEN MULTIPLE SETS ARE DRAWN IS UNCERTAIN. PLEASE NOTIFY THE MICROBIOLOGY DEPARTMENT WITHIN ONE WEEK IF SPECIATION AND SENSITIVITIES ARE REQUIRED.    Report Status 11/06/2016 FINAL  Final  Blood Culture ID Panel (Reflexed)     Status: Abnormal   Collection Time: 11/04/16  1:36 PM  Result Value Ref Range Status   Enterococcus species NOT DETECTED NOT DETECTED Final   Listeria monocytogenes NOT DETECTED NOT DETECTED Final   Staphylococcus species DETECTED (A) NOT DETECTED Final    Comment: CRITICAL RESULT CALLED TO, READ BACK BY AND VERIFIED WITH: A JOHNSTON 11/05/16 @ 0919 M VESTAL    Staphylococcus aureus NOT DETECTED NOT DETECTED Final   Methicillin resistance DETECTED (A) NOT DETECTED Final    Comment: CRITICAL RESULT CALLED TO, READ BACK BY AND VERIFIED WITH: A JOHNSTON 11/05/16 @ 0919 M VESTAL    Streptococcus species NOT DETECTED NOT DETECTED Final   Streptococcus agalactiae NOT DETECTED NOT DETECTED Final   Streptococcus pneumoniae NOT DETECTED NOT DETECTED Final   Streptococcus pyogenes NOT DETECTED NOT DETECTED Final   Acinetobacter baumannii NOT DETECTED NOT DETECTED Final   Enterobacteriaceae species NOT DETECTED NOT DETECTED Final   Enterobacter cloacae complex NOT DETECTED NOT DETECTED Final    Escherichia coli NOT DETECTED NOT DETECTED Final   Klebsiella oxytoca NOT DETECTED NOT DETECTED Final   Klebsiella pneumoniae NOT DETECTED NOT DETECTED Final   Proteus species NOT DETECTED NOT DETECTED Final   Serratia marcescens NOT DETECTED NOT DETECTED Final   Haemophilus influenzae NOT DETECTED NOT DETECTED Final   Neisseria meningitidis NOT DETECTED NOT DETECTED Final   Pseudomonas aeruginosa NOT DETECTED NOT DETECTED Final   Candida albicans NOT DETECTED NOT DETECTED Final   Candida glabrata NOT DETECTED NOT DETECTED Final   Candida krusei NOT DETECTED NOT DETECTED Final   Candida parapsilosis NOT DETECTED NOT DETECTED Final   Candida tropicalis NOT DETECTED NOT DETECTED Final  Culture, expectorated sputum-assessment     Status: None   Collection Time: 11/10/16  4:16 PM  Result Value Ref Range Status   Specimen Description SPUTUM  Final   Special Requests NONE  Final   Sputum evaluation   Final    Sputum specimen not acceptable for testing.  Please recollect.   Results Called to: Lavone Nian, RN AT 2055 ON 11/10/16 BY C. JESSUP, MLT.    Report Status 11/10/2016 FINAL  Final  Culture, expectorated sputum-assessment     Status: None   Collection Time: 11/11/16 12:35 AM  Result Value Ref Range Status   Specimen Description EXPECTORATED SPUTUM  Final   Special Requests NONE  Final   Sputum evaluation THIS SPECIMEN IS ACCEPTABLE FOR SPUTUM CULTURE  Final   Report Status 11/11/2016 FINAL  Final  Culture, respiratory (NON-Expectorated)     Status: None (Preliminary result)   Collection Time: 11/11/16 12:35 AM  Result Value Ref Range Status   Specimen Description EXPECTORATED SPUTUM  Final   Special Requests NONE Reflexed from [id number]  Final   Gram Stain   Final    ABUNDANT WBC PRESENT, PREDOMINANTLY PMN FEW SQUAMOUS EPITHELIAL CELLS PRESENT FEW YEAST RARE GRAM POSITIVE COCCI    Culture PENDING  Incomplete   Report Status PENDING  Incomplete  Radiology Studies: Ct  Chest Wo Contrast  Result Date: 11/10/2016 CLINICAL DATA:  Acute hypoxic respiratory failure in patient and mid with cardiogenic and septic shock. EXAM: CT CHEST WITHOUT CONTRAST TECHNIQUE: Multidetector CT imaging of the chest was performed following the standard protocol without IV contrast. COMPARISON:  Single-view of the chest 11/10/2016, 11/09/2016, 0 9/ since 08/2016 and 10/21/2016. FINDINGS: Cardiovascular: The patient is status post CABG. Calcific aortic and coronary atherosclerosis is present. There is mild cardiomegaly. No pericardial effusion. Mediastinum/Nodes: NG tube is in place. Small hiatal hernia is seen. No lymphadenopathy. Lungs/Pleura: Small left and small to moderate right pleural effusions are present. The patient has extensive bilateral upper lobe airspace disease. Milder degree of airspace disease is seen in the lower lobes bilaterally. Scattered ground-glass attenuation is noted. No pneumothorax. Upper Abdomen: No acute abnormality. Musculoskeletal: Multilevel thoracic spondylosis is seen. No lytic or sclerotic lesion. No acute abnormality. IMPRESSION: Extensive bilateral airspace disease with an upper lobe predominance could be due to multifocal pneumonia and/or edema. Distribution of airspace disease is atypical for aspiration. Small left and small to moderate right pleural effusion. Hiatal hernia. Aortic Atherosclerosis (ICD10-I70.0). Electronically Signed   By: Drusilla Kanner M.D.   On: 11/10/2016 23:29   Dg Chest Port 1 View  Result Date: 11/10/2016 CLINICAL DATA:  Hypoxia EXAM: PORTABLE CHEST 1 VIEW COMPARISON:  11/09/2016 FINDINGS: Multifocal patchy opacities, right upper lobe and left lower lobe predominant. While mild to moderate interstitial edema is possible, superimposed pneumonia is suspected. Possible small left pleural effusion.  No pneumothorax. Cardiomegaly. Postsurgical changes related to prior CABG. Median sternotomy. Enteric tube courses below the diaphragm.  IMPRESSION: Multifocal patchy opacities, favoring pneumonia superimposed on interstitial edema, stable versus mildly increased. Possible small left pleural effusion. Electronically Signed   By: Charline Bills M.D.   On: 11/10/2016 07:11   Dg Chest Port 1v Same Day  Result Date: 11/09/2016 CLINICAL DATA:  Dyspnea EXAM: PORTABLE CHEST 1 VIEW COMPARISON:  11/07/2016 FINDINGS: Progression of right upper lobe airspace disease. Mild improvement in right lower lobe airspace disease. Mild improvement in left lower lobe airspace disease and left effusion. Vascular congestion and possible edema. IMPRESSION: Progression of right upper lobe infiltrate and improvement in right lower lobe infiltrate. Improvement in left lower lobe airspace disease. Possible edema versus pneumonia Electronically Signed   By: Marlan Palau M.D.   On: 11/09/2016 14:18        Scheduled Meds: . amantadine  100 mg Oral TID  . aspirin  81 mg Oral Daily  . atorvastatin  10 mg Oral QHS  . carbidopa-levodopa  1 tablet Oral TID  . chlorhexidine  15 mL Mouth Rinse BID  . feeding supplement (PRO-STAT SUGAR FREE 64)  30 mL Per Tube Daily  . free water  400 mL Per Tube Q4H  . gabapentin  300 mg Per Tube Q8H  . Gerhardt's butt cream   Topical QID  . insulin aspart  0-9 Units Subcutaneous Q4H  . mouth rinse  15 mL Mouth Rinse q12n4p  . metoprolol tartrate  2.5 mg Intravenous Q6H  . pantoprazole sodium  40 mg Per Tube Daily  . pneumococcal 23 valent vaccine  0.5 mL Intramuscular Tomorrow-1000  . potassium chloride  40 mEq Per Tube Q4H  . primidone  50 mg Oral Q12H  . rOPINIRole  2 mg Oral Q12H   Continuous Infusions: . feeding supplement (JEVITY 1.2 CAL) Stopped (11/10/16 1804)  . fluconazole (DIFLUCAN) IV Stopped (11/11/16 1004)  . heparin 1,400  Units/hr (11/11/16 0554)  . nitroGLYCERIN Stopped (11/10/16 1356)     LOS: 11 days    Time spent: 40 minutes    Arnita Koons, MD [facility] Pager  [phone]  If 7PM-7AM, please contact night-coverage [email or web address] Password [facility] 11/11/2016, 11:23 AM

## 2016-11-11 NOTE — Progress Notes (Signed)
Progress Note  Patient Name: Jack Carter Date of Encounter: 11/11/2016  Primary Cardiologist: Dr Katrinka Blazing  Subjective   There is no family at the bedside. The patient denies chest pain or shortness of breath. He is thirsty. He is confused and not able to provide much history.  Inpatient Medications    Scheduled Meds: . amantadine  100 mg Oral TID  . aspirin  81 mg Oral Daily  . atorvastatin  10 mg Oral QHS  . carbidopa-levodopa  1 tablet Oral TID  . chlorhexidine  15 mL Mouth Rinse BID  . feeding supplement (PRO-STAT SUGAR FREE 64)  30 mL Per Tube Daily  . free water  400 mL Per Tube Q4H  . gabapentin  300 mg Per Tube Q8H  . Gerhardt's butt cream   Topical QID  . insulin aspart  0-9 Units Subcutaneous Q4H  . mouth rinse  15 mL Mouth Rinse q12n4p  . metoprolol tartrate  2.5 mg Intravenous Q6H  . pantoprazole sodium  40 mg Per Tube Daily  . pneumococcal 23 valent vaccine  0.5 mL Intramuscular Tomorrow-1000  . potassium chloride  40 mEq Per Tube Q4H  . primidone  50 mg Oral Q12H  . rOPINIRole  2 mg Oral Q12H   Continuous Infusions: . feeding supplement (JEVITY 1.2 CAL) Stopped (11/10/16 1804)  . fluconazole (DIFLUCAN) IV Stopped (11/11/16 1004)  . heparin 1,400 Units/hr (11/11/16 0554)  . nitroGLYCERIN Stopped (11/10/16 1356)   PRN Meds: albuterol, bisacodyl, fentaNYL (SUBLIMAZE) injection, nitroGLYCERIN, RESOURCE THICKENUP CLEAR, sodium chloride flush, traMADol   Vital Signs    Vitals:   11/11/16 0743 11/11/16 0800 11/11/16 0900 11/11/16 1000  BP: (!) 112/50 (!) 110/43 (!) 100/52 (!) 114/102  Pulse: 86 81 86 79  Resp: (!) 25 (!) 28 (!) 22 (!) 21  Temp:      TempSrc:      SpO2: 93% 95% 95% 97%  Weight:      Height:        Intake/Output Summary (Last 24 hours) at 11/11/16 1125 Last data filed at 11/11/16 7829  Gross per 24 hour  Intake          2480.87 ml  Output             2180 ml  Net           300.87 ml   Filed Weights   11/09/16 0500 11/10/16  0350 11/11/16 0421  Weight: 80.1 kg (176 lb 9.4 oz) 78.6 kg (173 lb 4.5 oz) 77.9 kg (171 lb 11.8 oz)    Telemetry    Normal sinus rhythm with occasional PVCs - Personally Reviewed  Physical Exam  Elderly male, confused, no distress GEN: No acute distress.   Neck: No JVD Cardiac: RRR, no murmurs, rubs, or gallops.  Respiratory:  diffuse rhonchi bilaterally GI: Soft, nontender, non-distended. Rectal tube in place, Foley catheter in place, nasal enteral tube in place MS:  1+ bilateral pretibial edema; No deformity. Neuro:  Nonfocal  Psych: Normal affect   Labs    Chemistry Recent Labs Lab 11/09/16 0321 11/09/16 0822 11/10/16 0514 11/11/16 0153  NA 148*  --  153* 149*  K 3.1* 3.0* 3.0* 3.1*  CL 110  --  115* 113*  CO2 28  --  27 28  GLUCOSE 135*  --  92 98  BUN 39*  --  38* 38*  CREATININE 2.13*  --  2.32* 2.38*  CALCIUM 7.6*  --  8.0* 7.9*  GFRNONAA  27*  --  25* 24*  GFRAA 32*  --  29* 28*  ANIONGAP 10  --  11 8     Hematology Recent Labs Lab 11/09/16 0321 11/10/16 0514 11/11/16 0153  WBC 10.9* 11.3* 12.9*  RBC 3.30* 3.59* 3.41*  HGB 10.0* 10.9* 10.2*  HCT 32.3* 34.3* 33.5*  MCV 97.9 95.5 98.2  MCH 30.3 30.4 29.9  MCHC 31.0 31.8 30.4  RDW 16.7* 16.8* 16.9*  PLT 153 164 195    Cardiac Enzymes Recent Labs Lab 11/06/16 0729 11/07/16 1814 11/07/16 2327 11/08/16 0740  TROPONINI 1.29* 0.82* 1.04* 1.10*   No results for input(s): TROPIPOC in the last 168 hours.   BNP Recent Labs Lab 11/06/16 0729 11/08/16 0452  BNP 813.0* 657.0*     DDimer  Recent Labs Lab 11/10/16 1816  DDIMER 3.36*     Radiology    Ct Chest Wo Contrast  Result Date: 11/10/2016 CLINICAL DATA:  Acute hypoxic respiratory failure in patient and mid with cardiogenic and septic shock. EXAM: CT CHEST WITHOUT CONTRAST TECHNIQUE: Multidetector CT imaging of the chest was performed following the standard protocol without IV contrast. COMPARISON:  Single-view of the chest  11/10/2016, 11/09/2016, 0 9/ since 08/2016 and 10/21/2016. FINDINGS: Cardiovascular: The patient is status post CABG. Calcific aortic and coronary atherosclerosis is present. There is mild cardiomegaly. No pericardial effusion. Mediastinum/Nodes: NG tube is in place. Small hiatal hernia is seen. No lymphadenopathy. Lungs/Pleura: Small left and small to moderate right pleural effusions are present. The patient has extensive bilateral upper lobe airspace disease. Milder degree of airspace disease is seen in the lower lobes bilaterally. Scattered ground-glass attenuation is noted. No pneumothorax. Upper Abdomen: No acute abnormality. Musculoskeletal: Multilevel thoracic spondylosis is seen. No lytic or sclerotic lesion. No acute abnormality. IMPRESSION: Extensive bilateral airspace disease with an upper lobe predominance could be due to multifocal pneumonia and/or edema. Distribution of airspace disease is atypical for aspiration. Small left and small to moderate right pleural effusion. Hiatal hernia. Aortic Atherosclerosis (ICD10-I70.0). Electronically Signed   By: Drusilla Kanner M.D.   On: 11/10/2016 23:29   Dg Chest Port 1 View  Result Date: 11/10/2016 CLINICAL DATA:  Hypoxia EXAM: PORTABLE CHEST 1 VIEW COMPARISON:  11/09/2016 FINDINGS: Multifocal patchy opacities, right upper lobe and left lower lobe predominant. While mild to moderate interstitial edema is possible, superimposed pneumonia is suspected. Possible small left pleural effusion.  No pneumothorax. Cardiomegaly. Postsurgical changes related to prior CABG. Median sternotomy. Enteric tube courses below the diaphragm. IMPRESSION: Multifocal patchy opacities, favoring pneumonia superimposed on interstitial edema, stable versus mildly increased. Possible small left pleural effusion. Electronically Signed   By: Charline Bills M.D.   On: 11/10/2016 07:11   Dg Chest Port 1v Same Day  Result Date: 11/09/2016 CLINICAL DATA:  Dyspnea EXAM: PORTABLE CHEST  1 VIEW COMPARISON:  11/07/2016 FINDINGS: Progression of right upper lobe airspace disease. Mild improvement in right lower lobe airspace disease. Mild improvement in left lower lobe airspace disease and left effusion. Vascular congestion and possible edema. IMPRESSION: Progression of right upper lobe infiltrate and improvement in right lower lobe infiltrate. Improvement in left lower lobe airspace disease. Possible edema versus pneumonia Electronically Signed   By: Marlan Palau M.D.   On: 11/09/2016 14:18    Cardiac Studies   2-D echocardiogram 11/03/2016: Study Conclusions  - Left ventricle: The cavity size was normal. Systolic function was   mildly to moderately reduced. The estimated ejection fraction was   in the range of 40%  to 45%. Features are consistent with a   pseudonormal left ventricular filling pattern, with concomitant   abnormal relaxation and increased filling pressure (grade 2   diastolic dysfunction). - Ventricular septum: The contour showed diastolic flattening and   systolic flattening. These changes are consistent with RV volume   and pressure overload. - Aortic valve: There was trivial regurgitation. - Mitral valve: There was mild to moderate regurgitation directed   centrally. - Left atrium: The atrium was moderately dilated. - Right ventricle: The cavity size was moderately dilated. Wall   thickness was normal. Systolic function was moderately reduced. - Right atrium: The atrium was moderately dilated. - Atrial septum: No defect or patent foramen ovale was identified. - Tricuspid valve: There was malcoaptation of the valve leaflets.   There was severe regurgitation directed centrally. - Pulmonary arteries: Systolic pressure was moderately increased.   PA peak pressure: 60 mm Hg (S).  Recommendations:  Recommend repeat imaging with Definity for better evaluation of left ventricular wall motion and overall systolic function.  Patient Profile     83 y.o.  male with a hx of CKD,  Parkinson's disease and CAD s/p prior CABG ([place] [facility]whom cardiology was consulted to see for elevated troponinand suddenly development of respiratory distress requiring intubation with transfer to ICU on 11/04/16. Felt decompensation due to aspiration and ? CHF.  Assessment & Plan    1. Acute respiratory failure, likely multifactorial secondary to aspiration and pulmonary edema. 2. Acute on chronic combined systolic and diastolic heart failure: The patient has been diuresed with IV Lasix and his weight is down significantly from admission. He is hypernatremic. This is slowly improving now with holding diuretic therapy. 3. Demand ischemia: Elevated troponin in the setting of critical illness and chronic kidney disease. He denies chest pain. He would not be a candidate for invasive cardiac evaluation. He does have a history of CABG. 4. Acute on chronic kidney injury. Creatinine today is 2.4 mg/dL with a GFR less than 30 (stage IV). Hold diuretics.  All data is reviewed. He is getting free water to address hypernatremia. The patient is on a beta blocker, aspirin, and a statin drug. I really don't see much from a cardiac perspective that is going to impact his overall prognosis. We will follow from a distance but don't have much more to offer at this point.  For questions or updates, please contact [facility] Please consult [email or web address] for contact info under Cardiology/STEMI.      Enzo BiSigned, Jaevian Shean, MD  11/11/2016, 11:25 AM

## 2016-11-11 NOTE — Progress Notes (Signed)
Nutrition Follow-up  DOCUMENTATION CODES:   Not applicable  INTERVENTION:  Resume Jevity 1.2 formula via Cortrak NGT at 70 ml/hr (1680 ml per day) with 30 ml Prostat once daily to provide 2116 kcal, 108 grams of protein, and 1361 ml of water.   Free water flushes 400 ml q 4 hours per MD.  Encourage PO intake once pt more alert.   NUTRITION DIAGNOSIS:   Inadequate oral intake related to inability to eat as evidenced by NPO status; diet advanced; pt lethargic not alert enough to eat  GOAL:   Patient will meet greater than or equal to 90% of their needs; progressing  MONITOR:   TF tolerance, Labs, I & O's, Diet advancement  REASON FOR ASSESSMENT:   Consult Enteral/tube feeding initiation and management  ASSESSMENT:   83 yo male with PMH of CAD, Parkinson's disease, HTN, and HLD who was admitted on 8/31 with bradycardia and hypotension. Intubated from 8/31 to 9/2. Developed sudden respiratory decompensation and required re-intubation on 9/4. Extubated 9/5.  Pt is currently on a dysphagia 1 diet with nectar thick liquids. Pt lethargic and not alert enough to eat this AM, thus breakfast tray was not given. Noted tube feeds were turned off. Per RN, tube feeds have been off since diet advancement yesterday. As pt lethargic and not alert to eat, will continue tube feeds until alert enough for PO feeds. Discussed plans for MD. MD agreeable.  Diet Order:  DIET - DYS 1 Room service appropriate? Yes; Fluid consistency: Nectar Thick  Skin:  Reviewed, no issues  Last BM:  9/11  Height:   Ht Readings from Last 1 Encounters:  10/31/16 5\' 9"  (1.753 m)    Weight:   Wt Readings from Last 1 Encounters:  11/11/16 171 lb 11.8 oz (77.9 kg)    Ideal Body Weight:  72.73 kg  BMI:  Body mass index is 25.36 kg/m.  Estimated Nutritional Needs:   Kcal:  1950-2150  Protein:  100-110 gm  Fluid:  2 L  EDUCATION NEEDS:   No education needs identified at this time  Roslyn SmilingStephanie Carrieanne Kleen,  MS, RD, LDN Pager # [phone] After hours/ weekend pager # [phone]

## 2016-11-11 NOTE — Progress Notes (Signed)
ANTICOAGULATION CONSULT NOTE  Pharmacy Consult for heparin Indication: chest pain/ACS  No Known Allergies  Patient Measurements: Height: 5\' 9"  (175.3 cm) Weight: 173 lb 4.5 oz (78.6 kg) IBW/kg (Calculated) : 70.7 Heparin Dosing Weight: 83 kg  Vital Signs: Temp: 98.8 F (37.1 C) (09/11 0349) Temp Source: Oral (09/11 0349) BP: 110/62 (09/11 0230) Pulse Rate: 80 (09/11 0230)  Labs:  Recent Labs  11/08/16 0740 11/08/16 1356 11/09/16 0321 11/09/16 1309 11/10/16 0514 11/11/16 0153  HGB 10.5*  --  10.0*  --  10.9* 10.2*  HCT 33.0*  --  32.3*  --  34.3* 33.5*  PLT 150  --  153  --  164 195  HEPARINUNFRC  --  0.33 0.23* 0.36 0.36 0.29*  CREATININE 2.02* 2.12* 2.13*  --  2.32*  --   TROPONINI 1.10*  --   --   --   --   --     Estimated Creatinine Clearance: 25 mL/min (A) (by C-G formula based on SCr of 2.32 mg/dL (H)).  Assessment: 83yoM with new EKG changes. Possible NSTEMI  Heparin level is subtherapeutic at 0.29 and no infusion issues per RN. CBC stable and no s/s bleeding noted.   Goal of Therapy:  Heparin level 0.3-0.7 units/ml Monitor platelets by anticoagulation protocol: Yes   Plan:  Increase heparin gtt to 1400 units/hr Heparin level in 8 hrs Daily heparin level and CBC Monitor for s/s bleeding   York CeriseKatherine Cook, PharmD Clinical Pharmacist 11/11/16 3:55 AM

## 2016-11-11 NOTE — Progress Notes (Signed)
  Speech Language Pathology Treatment: Dysphagia  Patient Details Name: Midge MiniumCharles L Andonian MRN: [id number] DOB: 04/24/1933 Today's Date: 11/11/2016 Time: [id number] SLP Time Calculation (min) (ACUTE ONLY): 15 min  Assessment / Plan / Recommendation Clinical Impression  Pt lethargic this am, so RN appropriately held breakfast tray.  More alert, asking questions.  Speech hypophonic and dysarthric secondary to Parkinson's.  Oral care provided, removing thick, dry secretions from palate.  Pt consumed purees and nectar-thick liquids with min verbal/tactile cues to chew and swallow.  No overt s/s of aspiration.  D/W Dr. Janee Mornhompson, who prefers to leave cortrak in place another 24-48 hours to ensure pt is alert enough to consume three meals.  SLP will follow.  HPI HPI: 83 yr old male with Hx of parkinson disease, CAD s/p CABG, CHF who presented with 1 week history of weakness and fall at home, was unresponsive and required intubation. Admitted with septic and cardiogenic shock secondary to cellulitis and hyperkalemia. Severe hyperkalemia from AKI, use of K and bactrim. Severe bradycardia from hyperkalemia. Acute hypoxemic respiratory failure. Severe metabolic encephalopathy. Intubated 8/31-9/2. He was assessed by SLP after extubation wtih FEES recommending NPO status. He was then reintubated 9/4-9/5.      SLP Plan  Continue with current plan of care       Recommendations  Diet recommendations: Dysphagia 1 (puree);Nectar-thick liquid Liquids provided via: Cup Medication Administration: Crushed with puree Supervision: Staff to assist with self feeding Compensations: Minimize environmental distractions;Slow rate;Small sips/bites;Follow solids with liquid Postural Changes and/or Swallow Maneuvers: Seated upright 90 degrees                Oral Care Recommendations: Oral care BID Follow up Recommendations: Skilled Nursing facility SLP Visit Diagnosis: Dysphagia, oropharyngeal phase (R13.12) Plan:  Continue with current plan of care       GO                [name], Myrtis Maille Laurice 11/11/2016, 12:03 PM

## 2016-11-11 NOTE — Progress Notes (Signed)
[facility] FOR AM LAB REPLACEMENT ONLY  The patient does not apply for the [facility] Electrolyte Replacment Protocol based on the criteria listed below:   . Is GFR >/= 40 ml/min? No.  Patient's GFR today is 24   Abnormal electrolyte(s): K3.1   If a panic level lab has been reported, has the CCM MD in charge been notified? Yes.  .   Physician:  Craige CottaSood, MD  Melrose NakayamaChisholm, Krystan Northrop William 11/11/2016 6:16 AM

## 2016-11-11 NOTE — Care Management Note (Addendum)
Case Management Note  Patient Details  Name: Midge MiniumCharles L Sciortino MRN: [id number] Date of Birth: 12/17/1933  Subjective/Objective:  Pt admitted post fall and losing consciousness - transferred from floor in resp distress and now intubated on ICU unit                 Action/Plan:  PTA from home with daughter - [place].  Pt will benefit from PT eval post extubation.  CM will continue to follow for discharge needs   Expected Discharge Date:                  Expected Discharge Plan:     In-House Referral:     Discharge planning Services  CM Consult  Post Acute Care Choice:    Choice offered to:     DME Arranged:    DME Agency:     HH Arranged:    HH Agency:     Status of Service:     If discussed at [employer] Meetings, dates discussed:    Additional Comments: 11/11/2016  CM was able to speak with daughter regarding possible LTACH referral and SNF dependent upon how pt progresses.  [facility] at [facility].  Pts daughter continues to request that CM not contact [facility] - "[facility] is just tertiary and will not cover stay regardless, pt is not service connected, I will contact them again when I need to".    Pt discussed in LOS 9/13 - remains appropriate for continued stay.  Pt received trach today - and per attending may have ongoing issues with weaning.   CM informed by CSW that daughter remains uninterested in facility placement at discharge.  CM left voicemail for daughter Idalia Needleaige.  11/11/16 Discussed in LOS 9/11 - pt remains appropriate for continued stay.  Pt urine out put has decreased and diuretics are being held due to rising creatinine and hypernatremia.  CT today shows potential mulitfocal PNA, D Dimer elevated.  Pts daughter refusing SNF - plans to take pt home with [facility].  [facility]  [facility] - daughter informed agency of admit  11/07/16 CM spoke with daughter - Pt remains  confused and calling out incoherent however per daughter pt was A/O PTA/  [place] recently moved from [place] in July.  Per daughter VA benefit is tertiary and not primary - daughter is not intrested in [facility]. Daughter informed [facility] of pts admit.   Per daughter - [facility] [place].  [facility] however [facility] [place] ([facility]).    CM will continue to follow for discharge needs Cherylann ParrClaxton, Camry Theiss S, RN 11/11/2016, 3:46 PM

## 2016-11-12 ENCOUNTER — Inpatient Hospital Stay (HOSPITAL_COMMUNITY): Payer: Medicare Other

## 2016-11-12 DIAGNOSIS — R570 Cardiogenic shock: Secondary | ICD-10-CM

## 2016-11-12 LAB — BASIC METABOLIC PANEL
Anion gap: 7 (ref 5–15)
BUN: 37 mg/dL — AB (ref 6–20)
CHLORIDE: 114 mmol/L — AB (ref 101–111)
CO2: 27 mmol/L (ref 22–32)
Calcium: 7.9 mg/dL — ABNORMAL LOW (ref 8.9–10.3)
Creatinine, Ser: 2.33 mg/dL — ABNORMAL HIGH (ref 0.61–1.24)
GFR calc Af Amer: 29 mL/min — ABNORMAL LOW (ref >60)
GFR, EST NON AFRICAN AMERICAN: 25 mL/min — AB (ref >60)
GLUCOSE: 125 mg/dL — AB (ref 65–99)
POTASSIUM: 3.3 mmol/L — AB (ref 3.5–5.1)
Sodium: 148 mmol/L — ABNORMAL HIGH (ref 135–145)

## 2016-11-12 LAB — GLUCOSE, CAPILLARY
GLUCOSE-CAPILLARY: 128 mg/dL — AB (ref 65–99)
GLUCOSE-CAPILLARY: 144 mg/dL — AB (ref 65–99)
GLUCOSE-CAPILLARY: 133 mg/dL — AB (ref 65–99)
GLUCOSE-CAPILLARY: 174 mg/dL — AB (ref 65–99)
Glucose-Capillary: 114 mg/dL — ABNORMAL HIGH (ref 65–99)
Glucose-Capillary: 155 mg/dL — ABNORMAL HIGH (ref 65–99)

## 2016-11-12 LAB — PROCALCITONIN
PROCALCITONIN: 0.33 ng/mL
Procalcitonin: 0.36 ng/mL

## 2016-11-12 LAB — CBC WITH DIFFERENTIAL/PLATELET
BASOS PCT: 1 %
Basophils Absolute: 0.1 10*3/uL (ref 0.0–0.1)
EOS PCT: 8 %
Eosinophils Absolute: 1 10*3/uL — ABNORMAL HIGH (ref 0.0–0.7)
HCT: 32.8 % — ABNORMAL LOW (ref 39.0–52.0)
Hemoglobin: 10 g/dL — ABNORMAL LOW (ref 13.0–17.0)
Lymphocytes Relative: 20 %
Lymphs Abs: 2.4 10*3/uL (ref 0.7–4.0)
MCH: 29.9 pg (ref 26.0–34.0)
MCHC: 30.5 g/dL (ref 30.0–36.0)
MCV: 97.9 fL (ref 78.0–100.0)
MONO ABS: 0.6 10*3/uL (ref 0.1–1.0)
Monocytes Relative: 5 %
NEUTROS PCT: 66 %
Neutro Abs: 7.9 10*3/uL — ABNORMAL HIGH (ref 1.7–7.7)
PLATELETS: 223 10*3/uL (ref 150–400)
RBC: 3.35 MIL/uL — ABNORMAL LOW (ref 4.22–5.81)
RDW: 17.1 % — ABNORMAL HIGH (ref 11.5–15.5)
WBC: 12 10*3/uL — ABNORMAL HIGH (ref 4.0–10.5)

## 2016-11-12 LAB — POCT I-STAT 3, ART BLOOD GAS (G3+)
ACID-BASE EXCESS: 3 mmol/L — AB (ref 0.0–2.0)
Bicarbonate: 28.3 mmol/L — ABNORMAL HIGH (ref 20.0–28.0)
O2 Saturation: 100 %
PH ART: 7.361 (ref 7.350–7.450)
PO2 ART: 239 mmHg — AB (ref 83.0–108.0)
TCO2: 30 mmol/L (ref 22–32)
pCO2 arterial: 50.6 mmHg — ABNORMAL HIGH (ref 32.0–48.0)

## 2016-11-12 LAB — HEPARIN LEVEL (UNFRACTIONATED): Heparin Unfractionated: 0.34 IU/mL (ref 0.30–0.70)

## 2016-11-12 MED ORDER — SODIUM CHLORIDE 0.9 % IV SOLN
1.0000 g | Freq: Two times a day (BID) | INTRAVENOUS | Status: DC
  Administered 2016-11-12 – 2016-11-18 (×13): 1 g via INTRAVENOUS
  Filled 2016-11-12 (×14): qty 1

## 2016-11-12 MED ORDER — FUROSEMIDE 10 MG/ML IJ SOLN
60.0000 mg | Freq: Once | INTRAMUSCULAR | Status: AC
  Administered 2016-11-12: 60 mg via INTRAVENOUS
  Filled 2016-11-12: qty 6

## 2016-11-12 MED ORDER — CHLORHEXIDINE GLUCONATE 0.12% ORAL RINSE (MEDLINE KIT)
15.0000 mL | Freq: Two times a day (BID) | OROMUCOSAL | Status: DC
  Administered 2016-11-12 – 2016-11-18 (×12): 15 mL via OROMUCOSAL

## 2016-11-12 MED ORDER — MIDAZOLAM HCL 2 MG/2ML IJ SOLN
INTRAMUSCULAR | Status: AC
  Filled 2016-11-12: qty 2

## 2016-11-12 MED ORDER — MIDAZOLAM HCL 2 MG/2ML IJ SOLN
2.0000 mg | INTRAMUSCULAR | Status: DC | PRN
  Administered 2016-11-13 – 2016-11-15 (×2): 2 mg via INTRAVENOUS
  Filled 2016-11-12: qty 2

## 2016-11-12 MED ORDER — MIDAZOLAM HCL 2 MG/2ML IJ SOLN
2.0000 mg | INTRAMUSCULAR | Status: DC | PRN
  Administered 2016-11-15: 2 mg via INTRAVENOUS
  Filled 2016-11-12 (×3): qty 2

## 2016-11-12 MED ORDER — FENTANYL CITRATE (PF) 100 MCG/2ML IJ SOLN
50.0000 ug | Freq: Once | INTRAMUSCULAR | Status: AC
  Administered 2016-11-12: 50 ug via INTRAVENOUS

## 2016-11-12 MED ORDER — BISACODYL 10 MG RE SUPP: 10.0000 mg | Freq: Every day | RECTAL | Status: DC | PRN

## 2016-11-12 MED ORDER — FENTANYL 2500MCG IN NS 250ML (10MCG/ML) PREMIX INFUSION
25.0000 ug/h | INTRAVENOUS | Status: DC
  Administered 2016-11-12: 75 ug/h via INTRAVENOUS
  Administered 2016-11-13: 100 ug/h via INTRAVENOUS
  Administered 2016-11-15: 125 ug/h via INTRAVENOUS
  Filled 2016-11-12 (×4): qty 250

## 2016-11-12 MED ORDER — FENTANYL CITRATE (PF) 100 MCG/2ML IJ SOLN
50.0000 ug | INTRAMUSCULAR | Status: DC | PRN
  Administered 2016-11-12: 50 ug via INTRAVENOUS
  Filled 2016-11-12: qty 2

## 2016-11-12 MED ORDER — VANCOMYCIN HCL IN DEXTROSE 750-5 MG/150ML-% IV SOLN
750.0000 mg | INTRAVENOUS | Status: DC
  Administered 2016-11-12 – 2016-11-15 (×4): 750 mg via INTRAVENOUS
  Filled 2016-11-12 (×5): qty 150

## 2016-11-12 MED ORDER — MIDAZOLAM HCL 2 MG/2ML IJ SOLN
1.0000 mg | INTRAMUSCULAR | Status: DC | PRN
  Administered 2016-11-12: 1 mg via INTRAVENOUS
  Filled 2016-11-12: qty 2

## 2016-11-12 MED ORDER — FENTANYL BOLUS VIA INFUSION
25.0000 ug | INTRAVENOUS | Status: DC | PRN
  Administered 2016-11-13 – 2016-11-17 (×3): 25 ug via INTRAVENOUS
  Filled 2016-11-12: qty 25

## 2016-11-12 MED ORDER — FENTANYL CITRATE (PF) 100 MCG/2ML IJ SOLN
INTRAMUSCULAR | Status: AC
  Filled 2016-11-12: qty 2

## 2016-11-12 MED ORDER — DOCUSATE SODIUM 50 MG/5ML PO LIQD: 100.0000 mg | Freq: Two times a day (BID) | ORAL | Status: DC | PRN

## 2016-11-12 MED ORDER — ORAL CARE MOUTH RINSE
15.0000 mL | Freq: Four times a day (QID) | OROMUCOSAL | Status: DC
  Administered 2016-11-12 – 2016-11-18 (×24): 15 mL via OROMUCOSAL

## 2016-11-12 NOTE — Progress Notes (Signed)
SLP Cancellation Note  Patient Details Name: Jack MiniumCharles L Huie MRN: [id number] DOB: 02/02/1934   Cancelled treatment:       Reason Eval/Treat Not Completed: Medical issues which prohibited therapy. Pt is currently requiring BiPAP, and is not appropriate for po trials at this time. Will continue to follow pt for improvement and po tolerance. Celia B. Murvin NatalBueche, [facility] LtdMSP, CCC-SLP Speech Language Pathologist [phone]  Leigh AuroraBueche, Celia Brown 11/12/2016, 11:13 AM

## 2016-11-12 NOTE — Progress Notes (Signed)
OT Cancellation Note  Patient Details Name: Jack Carter MRN: [id number] DOB: 05/30/1933   Cancelled Treatment:    Reason Eval/Treat Not Completed: Medical issues which prohibited therapy. Spoke with RN. Pt not medically appropriate to participate in OT this date due to respiratory distress requiring BiPAP. Will hold OT at this time and check back as appropriate.   Doristine Sectionharity A Akiah Bauch, MS OTR/L  Pager: [phone]   Doristine SectionCharity A Torra Pala 11/12/2016, 1:38 PM

## 2016-11-12 NOTE — Progress Notes (Signed)
PROGRESS NOTE    Jack Carter  NGE:[id number] DOB: 1933-12-03 DOA: 10/31/2016 PCP: No primary care provider on file.   Specialists:     Brief Narrative:  55 Parkinson's disease Hypertension Hyperlipidemia CAD prior CABG Admitted by critical care 10/31/16 with hypoxic respiratory failure  Upper GI bleed and severe hyperkalemia secondary to Bactrim for possible cellulitis intubated--->initially was on dopamine/levo fed Experience an STEMI troponin 0.1-->4.89/4 Was extubated on 11/04/16 developed respiratory failure and was reintubated  Has developed progressive hypernatremia, acute kidney injury, requiring tube feeds   Assessment & Plan:   Principal Problem:   Cardiogenic shock (HCC) Active Problems:   Septic shock (HCC)   Cellulitis   Hyperkalemia   AKI (acute kidney injury) (HCC)   Acute hypoxemic respiratory failure (HCC)   CAD (coronary artery disease) of artery bypass graft   CHF (congestive heart failure) (HCC)   Parkinson disease (HCC)   Upper GI bleed   Elevated troponin   Acute encephalopathy   Goals of care, counseling/discussion   Aspiration into airway   Hypernatremia   Great toe pain, unspecified laterality   Acute respiratory failure (HCC)   Coronary artery disease involving native coronary artery of native heart without angina pectoris   Acute on chronic combined systolic and diastolic CHF (congestive heart failure) (HCC)   Bradycardia, sinus   Dyspnea   Multifactorial respiratory failure secondary initially to aspiration currently with CHF exacerbation Trach aspirate candida, pneumococcal urinary antigen + Staph Coag + a contaminant IV Diflucan on board ? Stop date? white coutn 12 VQ scan intermediate risk for PE--still Rx NSTEMI so on Rx doses of heparin GTT /phamracy  Acute/chr Sys + Diast HF EF 40-45% grd 2 dd Currently -2.3 L and diuretics have been held since 9/10 CXR 9/11 shows persisting effusions Repeat in am--might need further  diuresis Might require PPV with BIPAP--RN aware to let me know if needing more support  Acute PE VQ scan intermediate Not sure if this is actually a PE--CT reviewed by me personally from 9/10 shows extensive disease ARDS vs fluid which could also cause a VQ mismatch  AKI/CKD stg 2-3 Hypernatremia to 153 baseline creat unknown Continue monitoring Usually Lasix as an aquaretic--can consider cautious re-addition a thiazide like metolazone for natriuresis  Parkinson's disease, severe Continue amantadine 100 3 times a day, Sinemet IR 1 tablet 3 times a day, primidone 50 every 12, Requip 2 mg every 12  Risk of dysphagia-modified atrium done this admission Continue free water 400 every 4, resource thickened and every feeds Jevity at 70 Also continue Prostat 30 daily Clydie Braun have a dysphagia 1 diet based on speech therapy input  NSTEMI, demand ischemia /Pulmonary edema 9/9-placed on BiPAP at bedtime Peak troponin 4 Currently on beta blocker IV metoprolol every 6 scheduled aspirin Cardiology peripherally following In the next 24 hours will convert to by mouth metoprolol 12.5 3 times a day Continue nitroglycerin for now IV GTT but can probably discontinue in a.m.--would defer this to cardiology    DVT prophylaxis: IV heparin Code Status:  Full  Family Communication: none present at bedsdie Disposition Plan:  inpatoent   Consultants:   Cardiology  Assumed care from critical care  Procedures:   Multiple  Antimicrobials:   Currently IV Diflucan    Subjective: Seen in the ICU and patient on BiPAP Taken off BiPAP and does not desat however conversation is not clear From what I'm able to understand from him and he has no new issues but nursing tells  me when they turned him this morning he desaturated into the 80s and had to be placed back on BiPAP as his work of breathing had increased He currently at the bedside after 5 minutes of being on nasal cannula does not desat He is  asking for water He is getting nasal feeds No fever no chills Some mild abdominal   Objective: Vitals:   11/12/16 0350 11/12/16 0427 11/12/16 0500 11/12/16 0700  BP:   (!) 117/107 (!) 120/55  Pulse:   (!) 105 70  Resp:   (!) 31 (!) 33  Temp: 99.6 F (37.6 C)     TempSrc: Oral     SpO2:   95% 96%  Weight:  79.2 kg (174 lb 9.7 oz)    Height:        Intake/Output Summary (Last 24 hours) at 11/12/16 0719 Last data filed at 11/12/16 0700  Gross per 24 hour  Intake           2804.5 ml  Output             1560 ml  Net           1244.5 ml   Filed Weights   11/10/16 0350 11/11/16 0421 11/12/16 0427  Weight: 78.6 kg (173 lb 4.5 oz) 77.9 kg (171 lb 11.8 oz) 79.2 kg (174 lb 9.7 oz)    Examination:  Alert oriented in no distress although seems to have increased work of breathing S1 and S2 tachycardic Abdomen soft postoperative changes midline lower abdominal scar No lower extremity edema, stasis dermatitis changes without any wounds Neurologically intact slight tremor power 5/5 smile symmetric  Data Reviewed: I have personally reviewed following labs and imaging studies  CBC:  Recent Labs Lab 11/08/16 0740 11/09/16 0321 11/10/16 0514 11/11/16 0153 11/12/16 0234  WBC 10.1 10.9* 11.3* 12.9* 12.0*  NEUTROABS 7.3 7.6 7.7 8.8* 7.9*  HGB 10.5* 10.0* 10.9* 10.2* 10.0*  HCT 33.0* 32.3* 34.3* 33.5* 32.8*  MCV 97.6 97.9 95.5 98.2 97.9  PLT 150 153 164 195 223   Basic Metabolic Panel:  Recent Labs Lab 11/06/16 0729  11/08/16 0740 11/08/16 1356 11/09/16 0321 11/09/16 0822 11/10/16 0514 11/11/16 0153 11/12/16 0234  NA 149*  < > 155* 152* 148*  --  153* 149* 148*  K 3.3*  < > 3.2* 2.9* 3.1* 3.0* 3.0* 3.1* 3.3*  CL 114*  < > 114* 113* 110  --  115* 113* 114*  CO2 23  < > --  GLUCOSE 119*  < > 92 129* 135*  --  92 98 125*  BUN 27*  < > 35* 39* 39*  --  38* 38* 37*  CREATININE 1.84*  < > 2.02* 2.12* 2.13*  --  2.32* 2.38* 2.33*  CALCIUM 8.0*  < >  7.9* 7.8* 7.6*  --  8.0* 7.9* 7.9*  MG 1.8  --  1.7  --   --   --  2.0 2.1  --   < > = values in this interval not displayed. GFR: Estimated Creatinine Clearance: 24.9 mL/min (A) (by C-G formula based on SCr of 2.33 mg/dL (H)). Liver Function Tests: No results for input(s): AST, ALT, ALKPHOS, BILITOT, PROT, ALBUMIN in the last 168 hours. No results for input(s): LIPASE, AMYLASE in the last 168 hours. No results for input(s): AMMONIA in the last 168 hours. Coagulation Profile: No results for input(s): INR, PROTIME in the last 168 hours. Cardiac Enzymes:  Recent Labs Lab 11/05/16 1107 11/06/16 0729 11/07/16 1814 11/07/16 2327 11/08/16 0740  TROPONINI 2.01* 1.29* 0.82* 1.04* 1.10*   BNP (last 3 results) No results for input(s): PROBNP in the last 8760 hours. HbA1C: No results for input(s): HGBA1C in the last 72 hours. CBG:  Recent Labs Lab 11/11/16 1202 11/11/16 1551 11/11/16 2000 11/11/16 2347 11/12/16 0349  GLUCAP 88 86 124* 121* 114*   Lipid Profile: No results for input(s): CHOL, HDL, LDLCALC, TRIG, CHOLHDL, LDLDIRECT in the last 72 hours. Thyroid Function Tests: No results for input(s): TSH, T4TOTAL, FREET4, T3FREE, THYROIDAB in the last 72 hours. Anemia Panel: No results for input(s): VITAMINB12, FOLATE, FERRITIN, TIBC, IRON, RETICCTPCT in the last 72 hours. Urine analysis:    Component Value Date/Time   COLORURINE AMBER (A) 11/01/2016 0030   APPEARANCEUR HAZY (A) 11/01/2016 0030   LABSPEC 1.019 11/01/2016 0030   PHURINE 5.0 11/01/2016 0030   GLUCOSEU NEGATIVE 11/01/2016 0030   HGBUR SMALL (A) 11/01/2016 0030   BILIRUBINUR NEGATIVE 11/01/2016 0030   KETONESUR NEGATIVE 11/01/2016 0030   PROTEINUR 100 (A) 11/01/2016 0030   NITRITE NEGATIVE 11/01/2016 0030   LEUKOCYTESUR NEGATIVE 11/01/2016 0030     Radiology Studies: Reviewed images personally in health database    Scheduled Meds: . amantadine  100 mg Oral TID  . aspirin  81 mg Oral Daily  .  atorvastatin  10 mg Oral QHS  . carbidopa-levodopa  1 tablet Oral TID  . chlorhexidine  15 mL Mouth Rinse BID  . feeding supplement (PRO-STAT SUGAR FREE 64)  30 mL Per Tube Daily  . free water  400 mL Per Tube Q4H  . gabapentin  300 mg Per Tube Q8H  . Gerhardt's butt cream   Topical QID  . insulin aspart  0-9 Units Subcutaneous Q4H  . mouth rinse  15 mL Mouth Rinse q12n4p  . metoprolol tartrate  2.5 mg Intravenous Q6H  . pantoprazole sodium  40 mg Per Tube Daily  . pneumococcal 23 valent vaccine  0.5 mL Intramuscular Tomorrow-1000  . primidone  50 mg Oral Q12H  . rOPINIRole  2 mg Oral Q12H   Continuous Infusions: . feeding supplement (JEVITY 1.2 CAL) 1,000 mL (11/12/16 0700)  . fluconazole (DIFLUCAN) IV Stopped (11/11/16 1004)  . heparin 1,400 Units/hr (11/12/16 0700)  . nitroGLYCERIN Stopped (11/10/16 1356)     LOS: 12 days    Time spent: 635    Pleas KochJai Devyne Hauger, MD [facility](P) [phone]   If 7PM-7AM, please contact night-coverage [email or web address] Password [email or web address] 11/12/2016, 7:19 AM

## 2016-11-12 NOTE — Progress Notes (Signed)
ANTICOAGULATION CONSULT NOTE  Pharmacy Consult for heparin Indication: chest pain/ACS  No Known Allergies  Patient Measurements: Height: 5\' 9"  (175.3 cm) Weight: 174 lb 9.7 oz (79.2 kg) IBW/kg (Calculated) : 70.7 Heparin Dosing Weight: 83 kg  Vital Signs: Temp: 99.6 F (37.6 C) (09/12 0350) Temp Source: Oral (09/12 0350) BP: 120/55 (09/12 0700) Pulse Rate: 70 (09/12 0700)  Labs:  Recent Labs  11/10/16 0514 11/11/16 0153 11/11/16 1129 11/12/16 0234  HGB 10.9* 10.2*  --  10.0*  HCT 34.3* 33.5*  --  32.8*  PLT 164 195  --  223  HEPARINUNFRC 0.36 0.29* 0.49 0.34  CREATININE 2.32* 2.38*  --  2.33*    Estimated Creatinine Clearance: 24.9 mL/min (A) (by C-G formula based on SCr of 2.33 mg/dL (H)).  Assessment: 83yoM with new EKG changes. Possible NSTEMI  Heparin level is therapeutic at 0.34. CBC stable and no s/s bleeding noted.   Goal of Therapy:  Heparin level 0.3-0.7 units/ml Monitor platelets by anticoagulation protocol: Yes   Plan:  Continue heparin gtt 1400 units/hr Daily heparin level and CBC Monitor for s/s bleeding   Isaac BlissMichael Kymora Sciara, PharmD, BCPS, BCCCP Clinical Pharmacist Clinical phone for 11/12/2016 from 7a-3:30p: [phone] If after 3:30p, please call main pharmacy at: [phone] 11/12/2016 7:30 AM

## 2016-11-12 NOTE — Consult Note (Signed)
[facility] / CRITICAL CARE MEDICINE   Name: Jack Carter MRN: [id number] DOB: 04/12/1933    ADMISSION DATE:  10/31/2016 CONSULTATION DATE:  11/12/16/  REFERRING MD:  Dr. Mahala Menghini  CHIEF COMPLAINT:  Respiratory distress  HISTORY OF PRESENT ILLNESS:   83 year old male with PMH of CAD s/p CABG, systolic HF, HTN, HLD, and parkinson's disease admitted by PCCM on 8/31 with hypoxic respiratory failure, AKI, severe hyperkalemia, and bradycardia thought related to recent bactrim treatment for cellulitis.    Hospital course complicated by septic shock and cardiogenic shock from NSTEMI.  TTE showing EF of 40-45%.  Cardiology and Heart failure team consulted with continued medical management for now.  He was extubated on 9/2 but returned back to the ICU on 9/4 requiring intubation for acute respiratory distress thought secondary to aspiration and acute pulmonary edema.  CXR showed new bilateral inflitrates and he was 6L positive at that time. He was extubated on 9/5 and transferred to [facility] service on 9/8.  He has intermittently required BiPAP and lasix for hypoxemia and respiratory distress however diuresis has been challenging due to hypernatremia; No lasix since 9/9.  Work of breathing worse despite BiPAP on 9/12 and PCCM re-consulted.    PAST MEDICAL HISTORY :  He  has a past medical history of Coronary artery disease; Essential hypertension; Hyperlipidemia; Parkinson's disease (HCC); and Restless leg syndrome.  PAST SURGICAL HISTORY: He  has a past surgical history that includes Coronary artery bypass graft.  No Known Allergies  No current facility-administered medications on file prior to encounter.    No current outpatient prescriptions on file prior to encounter.    FAMILY HISTORY:  His indicated that his mother is deceased. He indicated that his father is deceased.   SOCIAL HISTORY: He  reports that he has quit smoking. He has never used smokeless tobacco.  REVIEW OF SYSTEMS:   As  per HPI  SUBJECTIVE:  1.2 L/ weight up by 1.3 kgs/ 24 hours CXR  On BiPAP with increasing WOB, despite lasix 60 mg given at 1700  VITAL SIGNS: BP 134/60   Pulse 89   Temp (!) 100.9 F (38.3 C) (Axillary) Comment: RN Valerie notified  Resp (!) 25   Ht  (1.753 m)   Wt 174 lb 9.7 oz (79.2 kg)   SpO2 95%   BMI 25.78 kg/m   HEMODYNAMICS:    VENTILATOR SETTINGS: Vent Mode: BIPAP FiO2 (%):  [40 %] 40 % Set Rate:  [8 bmp] 8 bmp PEEP:  [5 cmH20] 5 cmH20  INTAKE / OUTPUT: I/O last 3 completed shifts: In: 4346.2 [I.V.:747.7; MV/HQ:4696.2; IV Piggyback:50] Out: 2085 [Urine:1635; Stool:450]  PHYSICAL EXAMINATION: General:  Chronically ill appearing elderly male in acute distress on BiPAP in bed, restless HEENT: MM pink/dry, PERRL, cortrak to right nare Neuro: Awake, talking, following commands CV:  rrr, no m/r/g PULM: even/labored on BiPAP 15/5, bilateral rales/rhonchi GI: soft, non-tender, bsx4 active  Extremities: warm/dry, no edema, statis dermatitis Skin: no rashes or lesions  LABS:  BMET  Recent Labs Lab 11/10/16 0514 11/11/16 0153 11/12/16 0234  NA 153* 149* 148*  K 3.0* 3.1* 3.3*  CL 115* 113* 114*  CO2 BUN 38* 38* 37*  CREATININE 2.32* 2.38* 2.33*  GLUCOSE 92 98 125*    Electrolytes  Recent Labs Lab 11/08/16 0740  11/10/16 0514 11/11/16 0153 11/12/16 0234  CALCIUM 7.9*  < > 8.0* 7.9* 7.9*  MG 1.7  --  2.0 2.1  --   < > =  values in this interval not displayed.  CBC  Recent Labs Lab 11/10/16 0514 11/11/16 0153 11/12/16 0234  WBC 11.3* 12.9* 12.0*  HGB 10.9* 10.2* 10.0*  HCT 34.3* 33.5* 32.8*  PLT 164 195 223    Coag's No results for input(s): APTT, INR in the last 168 hours.  Sepsis Markers  Recent Labs Lab 11/10/16 1816 11/11/16 0153 11/12/16 0234  PROCALCITON 0.44 0.39 0.36    ABG  Recent Labs Lab 11/09/16 1318  PHART 7.523*  PCO2ART 36.3  PO2ART 75.0*    Liver Enzymes No results for input(s): AST,  ALT, ALKPHOS, BILITOT, ALBUMIN in the last 168 hours.  Cardiac Enzymes  Recent Labs Lab 11/07/16 1814 11/07/16 2327 11/08/16 0740  TROPONINI 0.82* 1.04* 1.10*    Glucose  Recent Labs Lab 11/11/16 2000 11/11/16 2347 11/12/16 0349 11/12/16 0800 11/12/16 1133 11/12/16 1548  GLUCAP 124* 121* 114* 128* 144* 133*    Imaging Dg Chest Port 1 View  Result Date: 11/12/2016 CLINICAL DATA:  ARDS. EXAM: PORTABLE CHEST 1 VIEW COMPARISON:  01/20/2017 FINDINGS: The heart size and mediastinal contours are stable. Patchy consolidation throughout bilateral lungs are identified minimally improved particularly in the right upper lobe compared prior exam. There is left pleural effusion. A feeding tube is identified with distal tip not included on film. IMPRESSION: Patchy consolidation throughout bilateral lungs, minimally improved particularly in the right upper lobe compared to prior exam. Electronically Signed   By: Sherian ReinWei-Chen  Lin M.D.   On: 11/12/2016 17:24     STUDIES:  CT head 9/01 > chronic microvascular ischemic changes, Rt maxillary sinus opacification Echo 9/03 > EF 40 to 45%, grade 2 DD, mild/mod MR, severe TR, PAS 60 mmHg CT chest 9/10 >> Extensive bilateral airspace disease with an upper lobe predominance could be due to multifocal pneumonia and/or edema. Distribution of airspace disease is atypical for aspiration.   Small left and small to moderate right pleural effusion.  Hiatal hernia.  Aortic Atherosclerosis.    CULTURES: Blood 8/31 >> negative Urine 9/01 >> negative Blood 9/4 > 1/2 coag neg Urine Strep 9/4> Negative Urine Culture 9/4> negative Urine Legionella 9/4>negative Sputum Culture 9/4 > few candida albicans  Sputum 9/11 >> BC x 2 9/12 >> Sputum 9/12 >> UC 9/12 >>  ANTIBIOTICS: Zosyn 8/31>>9/3;  9/4 >> 9/11 Unasyn 9/3>>9/4 Vanc 8/31>>8/31;  9/4 >>9/6;  9/12>> Meropenem 9/12 >>  SIGNIFICANT EVENTS: 8/31 Admitted/ intubated  9/2 extubated 9/4  retintubated   LINES/TUBES: PIV x 2  cortrak right nare >> ETT 9/12 >>  DISCUSSION: [age] yoM admitted 8/31 with respiratory failure, tx for aki, septic and cardiogenic shock, with recurrent respiratory failure secondary to aspiration +/- flash pulmonary edema.   Developed acute respiratory distress on 9/12, failed BiPAP w/WOB and re-intubated for 3rd time per daughters request.    ASSESSMENT / PLAN:  PULMONARY A: Acute hypoxic respiratory failure requiring 3rd intubation since 8/31 after failed BiPAP- multifactorial to possible aspiration, acute pulmonary edema secondary to systolic HF, and r/o HCAP Pleural effusions- persistent despite diuresis  Dysphagia Trach aspirate + Candida species  R/o PE- VQ indeterminate- already on heparin P:   Intubate now then, PRVC 8 cc/kg Wean FiO2/ PEEP for sats > 92% CXR now and in am ABG in one hour S/p lasix x 1 Send sputum for cx See ID  CARDIOVASCULAR A:  AE of Systolic and diastolic HF - 9/3 TTE w/EF 40-45% and G2DD w/RV volume and pressure overload NSTEMI Pulm HTN Hx CAD s/p CABG  P:  Tele monitoring Cards following Goal MAP > 65 Continue metoprolol, asa, and lipitor Continued heparin gtt for ACS Diuresis as tolerated Follow electrolytes   RENAL A:   AKI/ CKD stage 3 Hypernatremia P:   Trend BMP / mag/ phos/ urinary output Strict I/Os, daily wts Replace electrolytes as indicated  GASTROINTESTINAL A:   Dyspagia-  -SLP recs s/p MBS for dysphagia 1 diet/ nectar thick  P:   Continue cortrak with TF and prostat  HEMATOLOGIC A:   Anemia of chronic disease -h/h stable P:  Daily CBC and heparin level On systemic heparin as above  INFECTIOUS A:   R/o aspiration/ HCAP PNA Sputum + for few Candida albicans 9/4 Cellulitis BLE- treated  P:   Pan-culture PCT now and trend per protocol Start empiric vanc and meropenem, narrow as able Trend CBC and fever curve Continue Diflucan (stop date? )  ENDOCRINE A:    Hyperglycemia   P:   CBG q 4 SSI   NEUROLOGIC A:   Parkinsons disease P:   RASS goal: -1 PRN fentanyl and versed Daily wake up assessments Continue amantadine, sinemet IR, primidone, and prequip   FAMILY  - Updates: Daughter at bedside and updated by Dr. Isaiah Serge.  Daughter at this times wishes for full, aggressive care.    - Inter-disciplinary family meet or Palliative Care meeting due by:  Ongoing.  CCT 50 mins  Posey Boyer, ACNP Pulmonary and Critical Care Medicine [facility] Pager: [phone]  11/12/2016, 5:46 PM

## 2016-11-12 NOTE — Procedures (Addendum)
Intubation Procedure Note Jack Carter [id number] 10/18/1933  Procedure: Intubation Indications: Airway protection and maintenance  Procedure Details Consent: Risks of procedure as well as the alternatives and risks of each were explained to the (patient/caregiver).  Consent for procedure obtained. Time Out: Verified patient identification, verified procedure, site/side was marked, verified correct patient position, special equipment/implants available, medications/allergies/relevent history reviewed, required imaging and test results available.  Performed  Maximum sterile technique was used including gloves and mask.  Miller size 3, grade 1 view. 1 attempt 8.0 ETT secured at 24, lip  Evaluation Hemodynamic Status: BP stable throughout; O2 sats: stable throughout Patient's Current Condition: stable Complications: No apparent complications Patient did tolerate procedure well. Chest X-ray ordered to verify placement.  CXR: pending.   [name] 11/12/2016

## 2016-11-12 NOTE — Progress Notes (Signed)
RN called this M.D. to inform the patient placed back on BiPAP Arousable however accessory muscles of inspiration working hard He is unintelligible because of the BiPAP Long discussion with his daughter at the bedside-have explained to her that repeated attempts at intubation are risky and that patient very well may need positive pressure support long-term and can manage on BiPAP however we may need to have a full goals of care discussion I've asked critical care to come by and see him again I've given him a dose of Lasix IV 60 stat Chest x-ray my read shows once consolidations right lobes with poor aeration We will see how he does overnight.  He is full code  Pleas KochJai Chevis Weisensel, MD [facility] [phone]

## 2016-11-12 NOTE — Progress Notes (Signed)
Pharmacy Antibiotic Note  Jack Carter is a 83 y.o. male admitted on 10/31/2016 with pneumonia /HCAP.  Pharmacy has been consulted by CCM for Merrem and Vancomyicn dosing. Patient intubated today for worsening respiratory status.   WBC down at 12 today. PCT 0.36 -trending down.  SCR peak 2.38, now 2.33 with estimated CrCl ~24.9 ml/min.  New fever with Tmax 100.9.  Plan: Merrem 1g IV every 12 hours. Vancomycin 750mg  IV every 24 hours.   Height: 5\' 9"  (175.3 cm) Weight: 174 lb 9.7 oz (79.2 kg) IBW/kg (Calculated) : 70.7  Temp (24hrs), Avg:99.5 F (37.5 C), Min:98.7 F (37.1 C), Max:100.9 F (38.3 C)   Recent Labs Lab 11/08/16 0740 11/08/16 1356 11/09/16 0321 11/10/16 0514 11/11/16 0153 11/12/16 0234  WBC 10.1  --  10.9* 11.3* 12.9* 12.0*  CREATININE 2.02* 2.12* 2.13* 2.32* 2.38* 2.33*    Estimated Creatinine Clearance: 24.9 mL/min (A) (by C-G formula based on SCr of 2.33 mg/dL (H)).    No Known Allergies  Antimicrobials this admission: Zosyn 8/31 >> 9/3; 9/4 >> 9/10 Fluconazole 9/7 >> Vancomycin 8/31 >> 9/2; 9/4 >> 9/6; 9/12 >> Unasyn 9/3 >> 9/4 Merrem 9/12 >>  Dose adjustments this admission:   Microbiology results: 9/4 respCx> mod candida albicans, few krusei 9/4 BCx> 1/2 coag neg staph species 9/4 ID panel> staph species, not aureus 8/31 Blood x 2>> ngtd 9/1 Urine>> No growth 9/1 MRSA PCR > neg 9/11 Resp pending  Thank you for allowing pharmacy to be a part of this patient's care.  Jack Carter, PharmD, BCPS Clinical Pharmacist Clinical phone 11/12/2016 until 11PM [phone]- [phone] After hours, please call #[phone]  11/12/2016 6:00 PM

## 2016-11-12 NOTE — Progress Notes (Signed)
   Agitated  Plan Sedation fent gtt  Dr. Kalman ShanMurali Burnett Spray, M.D., [facility] PLL[facility].C.C.P Pulmonary and Critical Care Medicine Staff Physician [facility] [place] Pulmonary and Critical Care Pager: [phone], If no answer or between  15:00h - 7:00h: call [phone]  11/12/2016 9:01 PM

## 2016-11-12 NOTE — Progress Notes (Signed)
Patient had increased WOB. Placed on Bipap and continued to have increased WOB.  Family wishes are to reintubate.  CCM doctor, RT and I at bedside.  100 mcg of Fentanyl, 2 versed, 20 Amidate, and 60 Roc used.  Patient stable throughput procedure.  Will continue to monitor.

## 2016-11-13 ENCOUNTER — Inpatient Hospital Stay (HOSPITAL_COMMUNITY): Payer: Medicare Other

## 2016-11-13 DIAGNOSIS — Z430 Encounter for attention to tracheostomy: Secondary | ICD-10-CM

## 2016-11-13 LAB — GLUCOSE, CAPILLARY
GLUCOSE-CAPILLARY: 114 mg/dL — AB (ref 65–99)
GLUCOSE-CAPILLARY: 99 mg/dL (ref 65–99)
GLUCOSE-CAPILLARY: 123 mg/dL — AB (ref 65–99)
Glucose-Capillary: 141 mg/dL — ABNORMAL HIGH (ref 65–99)
Glucose-Capillary: 106 mg/dL — ABNORMAL HIGH (ref 65–99)

## 2016-11-13 LAB — CULTURE, RESPIRATORY

## 2016-11-13 LAB — CBC WITH DIFFERENTIAL/PLATELET
BASOS ABS: 0.1 10*3/uL (ref 0.0–0.1)
BASOS PCT: 1 %
Eosinophils Absolute: 0.9 10*3/uL — ABNORMAL HIGH (ref 0.0–0.7)
Eosinophils Relative: 8 %
HCT: 31.5 % — ABNORMAL LOW (ref 39.0–52.0)
HEMOGLOBIN: 9.5 g/dL — AB (ref 13.0–17.0)
LYMPHS PCT: 18 %
Lymphs Abs: 2 10*3/uL (ref 0.7–4.0)
MCH: 29.9 pg (ref 26.0–34.0)
MCHC: 30.2 g/dL (ref 30.0–36.0)
MCV: 99.1 fL (ref 78.0–100.0)
Monocytes Absolute: 0.6 10*3/uL (ref 0.1–1.0)
Monocytes Relative: 5 %
NEUTROS ABS: 7.5 10*3/uL (ref 1.7–7.7)
Neutrophils Relative %: 68 %
Platelets: 252 10*3/uL (ref 150–400)
RBC: 3.18 MIL/uL — ABNORMAL LOW (ref 4.22–5.81)
RDW: 17.1 % — ABNORMAL HIGH (ref 11.5–15.5)
WBC: 11.1 10*3/uL — ABNORMAL HIGH (ref 4.0–10.5)

## 2016-11-13 LAB — COMPREHENSIVE METABOLIC PANEL
ALBUMIN: 1.6 g/dL — AB (ref 3.5–5.0)
ALT: 20 U/L (ref 17–63)
ANION GAP: 8 (ref 5–15)
AST: 46 U/L — ABNORMAL HIGH (ref 15–41)
Alkaline Phosphatase: 123 U/L (ref 38–126)
BUN: 33 mg/dL — ABNORMAL HIGH (ref 6–20)
CHLORIDE: 108 mmol/L (ref 101–111)
CO2: 27 mmol/L (ref 22–32)
Calcium: 7.5 mg/dL — ABNORMAL LOW (ref 8.9–10.3)
Creatinine, Ser: 2.05 mg/dL — ABNORMAL HIGH (ref 0.61–1.24)
GFR calc Af Amer: 33 mL/min — ABNORMAL LOW (ref >60)
GFR calc non Af Amer: 29 mL/min — ABNORMAL LOW (ref >60)
GLUCOSE: 109 mg/dL — AB (ref 65–99)
POTASSIUM: 3.1 mmol/L — AB (ref 3.5–5.1)
SODIUM: 143 mmol/L (ref 135–145)
TOTAL PROTEIN: 6.4 g/dL — AB (ref 6.5–8.1)
Total Bilirubin: 1 mg/dL (ref 0.3–1.2)

## 2016-11-13 LAB — CULTURE, RESPIRATORY W GRAM STAIN

## 2016-11-13 LAB — HEPARIN LEVEL (UNFRACTIONATED)
HEPARIN UNFRACTIONATED: 0.21 [IU]/mL — AB (ref 0.30–0.70)
Heparin Unfractionated: 0.26 IU/mL — ABNORMAL LOW (ref 0.30–0.70)

## 2016-11-13 LAB — PROCALCITONIN: PROCALCITONIN: 0.43 ng/mL

## 2016-11-13 MED ORDER — SODIUM CHLORIDE 0.9 % IV BOLUS (SEPSIS)
250.0000 mL | Freq: Once | INTRAVENOUS | Status: AC
  Administered 2016-11-13: 250 mL via INTRAVENOUS

## 2016-11-13 MED ORDER — ETOMIDATE 2 MG/ML IV SOLN
20.0000 mg | Freq: Once | INTRAVENOUS | Status: AC
  Administered 2016-11-13: 20 mg via INTRAVENOUS

## 2016-11-13 MED ORDER — HEPARIN (PORCINE) IN NACL 100-0.45 UNIT/ML-% IJ SOLN
1500.0000 [IU]/h | INTRAMUSCULAR | Status: DC
  Administered 2016-11-13: 1500 [IU]/h via INTRAVENOUS
  Filled 2016-11-13 (×2): qty 250

## 2016-11-13 MED ORDER — SODIUM CHLORIDE 0.9 % IV SOLN
0.0000 ug/min | INTRAVENOUS | Status: DC
  Administered 2016-11-13: 20 ug/min via INTRAVENOUS
  Filled 2016-11-13 (×2): qty 1

## 2016-11-13 MED ORDER — VECURONIUM BROMIDE 10 MG IV SOLR
8.0000 mg | Freq: Once | INTRAVENOUS | Status: AC
  Administered 2016-11-13: 8 mg via INTRAVENOUS

## 2016-11-13 MED ORDER — PRO-STAT SUGAR FREE PO LIQD
30.0000 mL | Freq: Two times a day (BID) | ORAL | Status: DC
  Administered 2016-11-13 – 2016-11-18 (×10): 30 mL
  Filled 2016-11-13 (×12): qty 30

## 2016-11-13 MED ORDER — JEVITY 1.2 CAL PO LIQD
1000.0000 mL | ORAL | Status: DC
  Administered 2016-11-14 – 2016-11-18 (×6): 1000 mL
  Filled 2016-11-13 (×9): qty 1000

## 2016-11-13 MED ORDER — MIDAZOLAM HCL 2 MG/2ML IJ SOLN
1.0000 mg | Freq: Once | INTRAMUSCULAR | Status: AC
  Administered 2016-11-13: 1 mg via INTRAVENOUS

## 2016-11-13 MED ORDER — HEPARIN (PORCINE) IN NACL 100-0.45 UNIT/ML-% IJ SOLN
1700.0000 [IU]/h | INTRAMUSCULAR | Status: DC
  Administered 2016-11-14 – 2016-11-15 (×2): 1650 [IU]/h via INTRAVENOUS
  Administered 2016-11-16 (×2): 1700 [IU]/h via INTRAVENOUS
  Filled 2016-11-13 (×8): qty 250

## 2016-11-13 MED ORDER — POTASSIUM CHLORIDE 20 MEQ/15ML (10%) PO SOLN
40.0000 meq | ORAL | Status: AC
  Administered 2016-11-13 (×2): 40 meq
  Filled 2016-11-13 (×2): qty 30

## 2016-11-13 NOTE — Progress Notes (Signed)
SLP Cancellation Note  Patient Details Name: Jack Carter MRN: [id number] DOB: 09/16/1933   Cancelled treatment:       Reason Eval/Treat Not Completed: Medical issues which prohibited therapy. Pt reintubated again. SLP to sign off - please reorder when ready.   Jack Carter, Jack Carter 11/13/2016, 8:48 AM  Jack Carter, M.A. CCC-SLP [phone]

## 2016-11-13 NOTE — Progress Notes (Signed)
Nutrition Follow-up  INTERVENTION:   Resume Jevity 1.2 with new goal of 55 ml/hr (1320 ml/day) 30 ml Prostat BID Provides: 1784 kcal (100% of needs), 103 grams protein, and 1069 ml free water.  Total free water: 3469 ml    NUTRITION DIAGNOSIS:   Inadequate oral intake related to inability to eat as evidenced by NPO status. Ongoing.   GOAL:   Patient will meet greater than or equal to 90% of their needs Progressing.   MONITOR:   TF tolerance, Labs, I & O's, Diet advancement  ASSESSMENT:   83 yo male with PMH of CAD, Parkinson's disease, HTN, and HLD who was admitted on 8/31 with bradycardia and hypotension. Intubated from 8/31 to 9/2. Developed sudden respiratory decompensation and required re-intubation on 9/4.  9/5 Cortrak placed 9/12 pt re-intubated for the third time due to suspected aspiration and pulmonary edema 9/13 trach placed, plan to start weaning trials  MV: 11 L/min Temp (24hrs), Avg:99.8 F (37.7 C), Min:99 F (37.2 C), Max:100.9 F (38.3 C)  Medications reviewed Labs reviewed: K+ 3.1 (L) TF off after trach placement, spoke with RN, plan to resume TF  TF: Jevity 1.2 @ 70 ml/hr with 30 ml Prostat daily 400 ml free water every 4 hours Provides: 2116 kcal, 108 grams protein, and 1361 ml free water, total free water: 3761 ml   Diet Order:  Diet NPO time specified  Skin:   (MASD buttocks/groin)  Last BM:  400 ml via rectal tube 9/12  Height:   Ht Readings from Last 1 Encounters:  10/31/16 5\' 9"  (1.753 m)    Weight:   Wt Readings from Last 1 Encounters:  11/13/16 174 lb 2.6 oz (79 kg)    Ideal Body Weight:  72.73 kg  BMI:  Body mass index is 25.72 kg/m.  Estimated Nutritional Needs:   Kcal:  1792  Protein:  100-110 gm  Fluid:  2 L  EDUCATION NEEDS:   No education needs identified at this time  Kendell BaneHeather Yarnell Arvidson RD, LDN, CNSC [phone] Pager [phone] After Hours Pager

## 2016-11-13 NOTE — Progress Notes (Signed)
PULMONARY / CRITICAL CARE MEDICINE   Name: Jack Carter MRN: [id number] DOB: [birth year]    ADMISSION DATE:  10/31/2016 CONSULTATION DATE:  11/07/2016  REFERRING MD:  Dr. Janee Morn   CHIEF COMPLAINT:  Acute Respiratory Distress   Brief:   83 yo male admitted with weakness.  Was being tx for cellulitis.  Found to have bradycardia and hypotension, hyperkalemia.  Required intubation for airway, but successfully extubated and transferred out of ICU.  Returned to ICU 9/04 with respiratory failure likely from aspiration and acute pulmonary edema.  PMHx of Parkinson disease, CAD s/p CABG, CHF. Course c/b NSTEMI w/ EF 40-45%, recurrent aspiration PNA and Renal failure.  ->now back on ICU service as of 9/12: this is his 3rd intubation.   SUBJECTIVE:  Re-intubated again 9/12 this is # 3  VITAL SIGNS: BP (!) 90/41   Pulse 83   Temp 100 F (37.8 C) (Oral)   Resp (!) 22   Ht  (1.753 m)   Wt 174 lb 2.6 oz (79 kg)   SpO2 93%   BMI 25.72 kg/m   HEMODYNAMICS:    VENTILATOR SETTINGS: Vent Mode: PRVC FiO2 (%):  [40 %-100 %] 50 % Set Rate:  [8 bmp-18 bmp] 18 bmp Vt Set:  [570 mL] 570 mL PEEP:  [5 cmH20] 5 cmH20 Pressure Support:  [10 cmH20] 10 cmH20 Plateau Pressure:  [15 cmH20-18 cmH20] 15 cmH20  INTAKE / OUTPUT:  Intake/Output Summary (Last 24 hours) at 11/13/16 0955 Last data filed at 11/13/16 0915  Gross per 24 hour  Intake          3697.63 ml  Output             1295 ml  Net          2402.63 ml     PHYSICAL EXAMINATION: General appearance:  83 Year old  Male NAD, sedated on vent  Eyes: anicteric sclerae, moist conjunctivae; PERRL, EOMI bilaterally. Mouth:  Moist membranes and no mucosal ulcerations; normal hard and soft palate, orally intubated Neck: Trachea midline; neck supple, no JVD Lungs/chest: scattered rhonchi, with normal respiratory effort and no intercostal retractions CV: RRR, no MRGs  Abdomen: Soft, non-tender; no masses or HSM Extremities: No  peripheral edema or extremity lymphadenopathy Skin: Normal temperature, turgor and texture; no rash, ulcers or subcutaneous nodules Psych: sedated on vent  LABS:  BMET  Recent Labs Lab 11/11/16 0153 11/12/16 0234 11/13/16 0126  NA 149* 148* 143  K 3.1* 3.3* 3.1*  CL 113* 114* 108  CO2 BUN 38* 37* 33*  CREATININE 2.38* 2.33* 2.05*  GLUCOSE 98 125* 109*    Electrolytes  Recent Labs Lab 11/08/16 0740  11/10/16 0514 11/11/16 0153 11/12/16 0234 11/13/16 0126  CALCIUM 7.9*  < > 8.0* 7.9* 7.9* 7.5*  MG 1.7  --  2.0 2.1  --   --   < > = values in this interval not displayed.  CBC  Recent Labs Lab 11/11/16 0153 11/12/16 0234 11/13/16 0126  WBC 12.9* 12.0* 11.1*  HGB 10.2* 10.0* 9.5*  HCT 33.5* 32.8* 31.5*  PLT 195 223 252    Coag's No results for input(s): APTT, INR in the last 168 hours.  Sepsis Markers  Recent Labs Lab 11/12/16 0234 11/12/16 1843 11/13/16 0126  PROCALCITON 0.36 0.33 0.43    ABG  Recent Labs Lab 11/09/16 1318 11/12/16 1831  PHART 7.523* 7.361  PCO2ART 36.3 50.6*  PO2ART 75.0* 239.0*    Liver Enzymes  Recent Labs Lab 11/13/16 0126  AST 46*  ALT 20  ALKPHOS 123  BILITOT 1.0  ALBUMIN 1.6*    Cardiac Enzymes  Recent Labs Lab 11/07/16 1814 11/07/16 2327 11/08/16 0740  TROPONINI 0.82* 1.04* 1.10*    Glucose  Recent Labs Lab 11/12/16 1133 11/12/16 1548 11/12/16 1929 11/12/16 2307 11/13/16 0302 11/13/16 0808  GLUCAP 144* 133* 174* 155* 114* 141*    Imaging Portable Chest Xray  Result Date: 11/13/2016 CLINICAL DATA:  Intubation. EXAM: PORTABLE CHEST 1 VIEW COMPARISON:  11/12/2016. FINDINGS: Endotracheal tube and feeding tube in stable position. Prior CABG. Cardiomegaly. Persistent diffuse bilateral airspace disease. Small bilateral pleural effusions. No pneumothorax. Surgical clips and sutures noted in the upper abdomen. IMPRESSION: 1. Lines and tubes in stable position. 2. Persistent bilateral  diffuse airspace disease again noted. Bibasilar atelectasis. Small bilateral pleural effusions. 3. Prior CABG.  Stable cardiomegaly. Electronically Signed   By: Maisie Fushomas  Register   On: 11/13/2016 06:37   Portable Chest Xray  Result Date: 11/12/2016 CLINICAL DATA:  Intubation EXAM: PORTABLE CHEST 1 VIEW COMPARISON:  11/12/2016 FINDINGS: Diffuse bilateral airspace disease, worsening since prior study, likely worsening edema. Endotracheal tube tip approximately 2 cm above the carina. Cardiomegaly. Feeding tube remains in place. IMPRESSION: Endotracheal tube 2 cm above the carina. Severe diffuse bilateral airspace disease, worsening since prior study, likely edema/ CHF. Electronically Signed   By: Charlett NoseKevin  Dover M.D.   On: 11/12/2016 19:04   Dg Chest Port 1 View  Result Date: 11/12/2016 CLINICAL DATA:  ARDS. EXAM: PORTABLE CHEST 1 VIEW COMPARISON:  01/20/2017 FINDINGS: The heart size and mediastinal contours are stable. Patchy consolidation throughout bilateral lungs are identified minimally improved particularly in the right upper lobe compared prior exam. There is left pleural effusion. A feeding tube is identified with distal tip not included on film. IMPRESSION: Patchy consolidation throughout bilateral lungs, minimally improved particularly in the right upper lobe compared to prior exam. Electronically Signed   By: Sherian ReinWei-Chen  Lin M.D.   On: 11/12/2016 17:24    STUDIES:  CT head 9/01 > chronic microvascular ischemic changes, Rt maxillary sinus opacification Echo 9/03 > EF 40 to 45%, grade 2 DD, mild/mod MR, severe TR, PAS 60 mmHg  CULTURES: Blood 8/31 >> negative Urine 9/01 >> negative Blood 9/4 > GPC in clusters > staphylococcus    Urine Strep 9/4> Negative Urine Culture 9/4> negative Urine Legionella 9/4> negative Sputum Culture 9/4 > few candida albicans   ANTIBIOTICS: Zosyn  8/31>>9/3 Unasyn 9/3>>9/4 Vanc 8/31>>8/31 Vanc 9/4>>9/06 Zosyn 9/4>>  SIGNIFICANT EVENTS: Intubated 8/31 >  9/2 Re-intubated 9/4 > 9/05  LINES/TUBES: femoral CVC > 9/6  DISCUSSION: 83 yo male admitted with septic shock with cellulitis, cardiogenic shock with bradycardia, and hyperkalemia from bactrim.  Developed recurrent respiratory failure after aspiration event and acute pulmonary edema 9/04.  Also had encephalopathy related to medications.  Recurrent respiratory failure (intubation #3) d/t aspiration event +/- edema. Failed BIPAP and re-intubated -will cont abx -trach today -discussed w/ daughter   ASSESSMENT / PLAN:  Acute hypoxic respiratory failure from aspiration and pulmonary edema indeterminate V/Q scan PCXR ETT improved. Has bilateral patchy infiltrates; ETT good position  Suspect once again this is a mix of aspiration and pulmonary edema  Plan  Cont full vent support  PAD protocol -2  Will need trach; likely PEG Cont meropenem day 2; fluconazole day 2; vanc day 2 F/u pending cultures   NSTEMI > Inferior ischemia, Anterolateral ischemia ? 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 H/O Parkinson disease, restless leg syndrome Plan  Cont amatadine, sinemet and requip   - Inter-disciplinary family meet or Palliative Care meeting due by: Ongoing    My cct 40 min  Simonne Martinet ACNP-BC [facility] Pulmonary/Critical Care Pager # [phone] OR # [phone] if no answer

## 2016-11-13 NOTE — Progress Notes (Signed)
ANTICOAGULATION CONSULT NOTE - FOLLOW UP    HL = 0.21 (goal 0.3 - 0.7 units/mL) Heparin dosing weight = 83 kg   Assessment: 83 YOM continues on IV heparin for possible NSTEMI.  Heparin level is sub-therapeutic; no bleeding nor issue reported.   Plan: Increase heparin gtt to 1650 units/hr Check 8 hr heparin level    Bless Lisenby D. Laney Potashang, PharmD, BCPS 11/13/2016, 11:40 PM

## 2016-11-13 NOTE — Progress Notes (Signed)
PT Cancellation Note  Patient Details Name: Midge MiniumCharles L Fini MRN: [id number] DOB: 04/08/1933   Cancelled Treatment:    Reason Eval/Treat Not Completed: Patient at procedure or test/unavailable (getting trach, per nsg hold today)   Fabio Asaevon J Trey Bebee 11/13/2016, 11:17 AM Charlotte Crumbevon Laury Huizar, PT DPT  Board Certified Neurologic Specialist [phone]

## 2016-11-13 NOTE — Progress Notes (Signed)
Spoke with daughter Idalia Needleaige on the phone, she is requesting that SLP not work with him anymore.

## 2016-11-13 NOTE — Procedures (Signed)
Bronchoscopy Procedure Note Kyros L Fyfe [id number] 03/31/1933  Procedure: Bronchoscopy Indications: trach   Procedure Details Consent: Risks of proMidge Miniumcedure as well as the alternatives and risks of each were explained to the (patient/caregiver).  Consent for procedure obtained. Time Out: Verified patient identification, verified procedure, site/side was marked, verified correct patient position, special equipment/implants available, medications/allergies/relevent history reviewed, required imaging and test results available.  Performed  In preparation for procedure, patient was given 100% FiO2, bronchoscope lubricated and inhaled beta agonist administered. Sedation: Benzodiazepines, Muscle relaxants and Etomidate Procedure done by P Babcock ACNP-BC, under direct supervision of Dr Isaiah SergeMannam. At first bronch was introduce through ET tube and structures of tracheal rings, carina identified for operator of tracheostomy who was Dr Tyson AliasFeinstein . Light of bronch passed through trachea and skin for indentification of tracheal rings for tracheostomy puncture. After this, under bronchoscopy guidance,  ET tube was pulled back sufficiently and very carefully. The ET tube was  pulled back enough to give room for tracheostomy operator and yet at same time to to ensure a secured airway. After this was accomplished, bronchoscope was withdrawn into the ET tube. After this,  Dr Tyson AliasFeinstein  then performed tracheostomy under video visual provided by flexible video bronchoscopy. Followng introduction of tracheostomy,  the bronchoscope was removed from ET tube and introduced through tracheostomy. Correct position of tracheostomy was ensured, with enough room between carina and distal tracheostomy and no evidence of bleeding. The bronchoscope was then withdrawn. Respiratory therapist was then instructed to remove the ET tube.  Dr Tyson AliasFeinstein  then proceeded to complete the tracheostomy with stay sutures   No complications   Airway entered and the following bronchi were examined: LLL.   Procedures performed: Brushings performed Bronchoscope removed.    Evaluation Hemodynamic Status: BP stable throughout; O2 sats: stable throughout Patient's Current Condition: stable Specimens:  Sent purulent fluid Complications: No apparent complications Patient did tolerate procedure well.  Simonne MartinetPeter E Babcock ACNP-BC [facility] Pulmonary/Critical Care Pager # [phone] OR # [phone] if no answer  Shelby Mattocksete E Babcock 11/13/2016

## 2016-11-13 NOTE — Progress Notes (Signed)
eLink Physician-Brief Progress Note Patient Name: Jack MiniumCharles L Carter DOB: 05/22/1933 MRN: [id number]   Date of Service  11/13/2016  HPI/Events of Note  Hypokalemia  eICU Interventions  Potassium replaced     Intervention Category Intermediate Interventions: Electrolyte abnormality - evaluation and management  [name] 11/13/2016, 4:26 AM

## 2016-11-13 NOTE — Progress Notes (Signed)
ANTICOAGULATION CONSULT NOTE - Follow Up Consult  Pharmacy Consult for heparin Indication: r/o ACS  Labs:  Recent Labs  11/10/16 0514 11/11/16 0153 11/11/16 1129 11/12/16 0234 11/13/16 0126  HGB 10.9* 10.2*  --  10.0*  --   HCT 34.3* 33.5*  --  32.8*  --   PLT 164 195  --  223  --   HEPARINUNFRC 0.36 0.29* 0.49 0.34 0.26*  CREATININE 2.32* 2.38*  --  2.33*  --     Assessment: 83yo male subtherapeutic on heparin after two levels at goal though had been trending down.  Goal of Therapy:  Heparin level 0.3-0.7 units/ml   Plan:  Will increase heparin gtt by 1-2 units/kg/hr to 1500 units/hr and check level in 8hr.  Vernard GamblesVeronda Jacub Waiters, PharmD, BCPS  11/13/2016,3:29 AM

## 2016-11-13 NOTE — Procedures (Signed)
Name:  Jack MiniumCharles L Spurgeon MRN:  [id number] DOB:  05/28/1933  OPERATIVE NOTE  Procedure:  Percutaneous tracheostomy.  Indications:  Ventilator-dependent respiratory failure.  Consent:  Procedure, alternatives, risks and benefits discussed with medical POA.  Questions answered.  Consent obtained.  Anesthesia:  Versed, fent, etomidate, paralysis  Procedure summary:  Appropriate equipment was assembled.  The patient was identified as Jack Miniumharles L Obeso and safety timeout was performed. The patient was placed in supine position with a towel roll behind shoulder blades and neck extended.  Sterile technique was used. The patient's neck and upper chest were prepped using chlorhexidine / alcohol scrub and the field was draped in usual sterile fashion with full body drape. After the adequate sedation / anesthesia was achieved, attention was directed at the midline trachea, where the cricothyroid membrane was palpated. Approximately two fingerbreadths above the sternal notch, a vertical  incision was created with a scalpel after local infiltration with 0.2% Lidocaine. Then, using Seldinger technique and a percutaneous tracheostomy set, the trachea was entered with a 14 gauge needle with an overlying sheath. This was all confirmed under direct visualization of a fiberoptic flexible bronchoscope. Entrance into the trachea was identified through the third tracheal ring interspace. Following this, a guidewire was inserted. The needle was removed, leaving the sheath and the guidewire intact. Next, the sheath was removed and a small dilator was inserted. The tracheal rings were then dilated. A #6 Shiley was then opened. The balloon was checked. It was placed over a tracheal dilator, which was then advanced over the guidewire and through the previously dilated tract. The Shiley tracheostomy tube was noted to pass in the trachea with little resistance. The guidewire and dilator tubes were removed from the trachea. An inner  cannula was placed through the tracheostomy tube. The tracheostomy was then secured at the anterior neck with 4 monofilament sutures. The oral endotracheal tube was removed and the ventilator was attached to the newly placed tracheostomy tube. Adequate tidal volumes were noted. The cuff was inflated and no evidence of air leak was noted. No evidence of bleeding was noted. At this point, the procedure was concluded. Post-procedure chest x-ray was ordered.  Complications:  No immediate complications were noted.  Hemodynamic parameters and oxygenation remained stable throughout the procedure.  Estimated blood loss:  Less then 1 mL.  Nelda BucksFEINSTEIN,DANIEL J., MD Pulmonary and Critical Care Medicine [facility] Pager: [phone]  11/13/2016, 12:17 PM  Should follow up with [facility] trach clinic [phone]

## 2016-11-13 NOTE — Progress Notes (Signed)
CSW followed up with pt's daughter [name](Paige) this morning regarding HH and SNF placement. At this time Idalia Needleaige made CSW aware that she is not focused on pt's after care at the moment but would like for pt to pull through at this time. Paige asked CSW if there was a type of insurance that would allow pt to return home on a ventilatior if needed. CSW informed Idalia Needleaige that each insurance policy is different and that she could call pt's insurance company and see what pt's policy offers regarding medical needs. CSW also informed Idalia Needleaige that she could potentially ask RN CM and she may be abel to give other alternatives if needed. Paige asked that CSW have RN CM to call her. CSW still continues to follow pt discharge needs at this time.     Claude MangesKierra S. Keyshun Elpers, MSW, LCSW-A Emergency Department Clinical Social Worker [phone]

## 2016-11-14 ENCOUNTER — Inpatient Hospital Stay (HOSPITAL_COMMUNITY): Payer: Medicare Other

## 2016-11-14 LAB — COMPREHENSIVE METABOLIC PANEL
ALBUMIN: 1.5 g/dL — AB (ref 3.5–5.0)
ALK PHOS: 114 U/L (ref 38–126)
ALT: 11 U/L — ABNORMAL LOW (ref 17–63)
ANION GAP: 6 (ref 5–15)
AST: 46 U/L — AB (ref 15–41)
BILIRUBIN TOTAL: 1 mg/dL (ref 0.3–1.2)
BUN: 38 mg/dL — AB (ref 6–20)
CO2: 25 mmol/L (ref 22–32)
Calcium: 7.6 mg/dL — ABNORMAL LOW (ref 8.9–10.3)
Chloride: 108 mmol/L (ref 101–111)
Creatinine, Ser: 2.16 mg/dL — ABNORMAL HIGH (ref 0.61–1.24)
GFR calc Af Amer: 31 mL/min — ABNORMAL LOW (ref >60)
GFR, EST NON AFRICAN AMERICAN: 27 mL/min — AB (ref >60)
GLUCOSE: 109 mg/dL — AB (ref 65–99)
POTASSIUM: 4.3 mmol/L (ref 3.5–5.1)
Sodium: 139 mmol/L (ref 135–145)
TOTAL PROTEIN: 6 g/dL — AB (ref 6.5–8.1)

## 2016-11-14 LAB — GLUCOSE, CAPILLARY
GLUCOSE-CAPILLARY: 100 mg/dL — AB (ref 65–99)
GLUCOSE-CAPILLARY: 147 mg/dL — AB (ref 65–99)
Glucose-Capillary: 120 mg/dL — ABNORMAL HIGH (ref 65–99)
Glucose-Capillary: 124 mg/dL — ABNORMAL HIGH (ref 65–99)
Glucose-Capillary: 132 mg/dL — ABNORMAL HIGH (ref 65–99)
Glucose-Capillary: 136 mg/dL — ABNORMAL HIGH (ref 65–99)
Glucose-Capillary: 149 mg/dL — ABNORMAL HIGH (ref 65–99)

## 2016-11-14 LAB — URINE CULTURE: CULTURE: NO GROWTH

## 2016-11-14 LAB — CBC
HEMATOCRIT: 29.8 % — AB (ref 39.0–52.0)
HEMOGLOBIN: 8.9 g/dL — AB (ref 13.0–17.0)
MCH: 30 pg (ref 26.0–34.0)
MCHC: 29.9 g/dL — AB (ref 30.0–36.0)
MCV: 100.3 fL — AB (ref 78.0–100.0)
Platelets: 282 10*3/uL (ref 150–400)
RBC: 2.97 MIL/uL — ABNORMAL LOW (ref 4.22–5.81)
RDW: 17.4 % — AB (ref 11.5–15.5)
WBC: 10.5 10*3/uL (ref 4.0–10.5)

## 2016-11-14 LAB — PROCALCITONIN: Procalcitonin: 0.4 ng/mL

## 2016-11-14 LAB — HEPARIN LEVEL (UNFRACTIONATED): HEPARIN UNFRACTIONATED: 0.39 [IU]/mL (ref 0.30–0.70)

## 2016-11-14 MED ORDER — SODIUM CHLORIDE 0.9% FLUSH
10.0000 mL | Freq: Two times a day (BID) | INTRAVENOUS | Status: DC
  Administered 2016-11-14 – 2016-11-17 (×6): 10 mL

## 2016-11-14 MED ORDER — CHLORHEXIDINE GLUCONATE CLOTH 2 % EX PADS
6.0000 | MEDICATED_PAD | Freq: Every day | CUTANEOUS | Status: DC
  Administered 2016-11-15 – 2016-11-17 (×3): 6 via TOPICAL

## 2016-11-14 MED ORDER — POTASSIUM CHLORIDE 20 MEQ/15ML (10%) PO SOLN
40.0000 meq | Freq: Once | ORAL | Status: AC
Start: 2016-11-14 — End: 2016-11-14
  Administered 2016-11-14: 40 meq

## 2016-11-14 MED ORDER — POTASSIUM CHLORIDE 20 MEQ/15ML (10%) PO SOLN
ORAL | Status: AC
  Filled 2016-11-14: qty 30

## 2016-11-14 MED ORDER — SODIUM CHLORIDE 0.9% FLUSH: 10.0000 mL | INTRAVENOUS | Status: DC | PRN

## 2016-11-14 MED ORDER — FUROSEMIDE 10 MG/ML IJ SOLN
INTRAMUSCULAR | Status: AC
  Filled 2016-11-14: qty 4

## 2016-11-14 MED ORDER — FUROSEMIDE 10 MG/ML IJ SOLN
40.0000 mg | Freq: Once | INTRAMUSCULAR | Status: AC
  Administered 2016-11-14: 40 mg via INTRAVENOUS

## 2016-11-14 NOTE — Procedures (Signed)
Central Venous Catheter Insertion Procedure Note Jack Carter [id number] [birth year]  Procedure: Insertion of Central Venous Catheter Indications: Assessment of intravascular volume, Drug and/or fluid administration and Frequent blood sampling  Procedure Details Consent: Risks of procedure as well as the alternatives and risks of each were explained to the (patient/caregiver).  Consent for procedure obtained. Time Out: Verified patient identification, verified procedure, site/side was marked, verified correct patient position, special equipment/implants available, medications/allergies/relevent history reviewed, required imaging and test results available.  Performed  Maximum sterile technique was used including antiseptics, cap, gloves, gown, hand hygiene, mask and sheet. Skin prep: Chlorhexidine; local anesthetic administered A antimicrobial bonded/coated triple lumen catheter was placed in the right subclavian vein using the Seldinger technique.  Evaluation Blood flow good Complications: No apparent complications Patient did tolerate procedure well. Chest X-ray ordered to verify placement.  CXR: pending.  Shelby Mattocks 11/14/2016, 12:00 PM  Simonne Martinet ACNP-BC [facility] Pulmonary/Critical Care Pager # [phone] OR # [phone] if no answer

## 2016-11-14 NOTE — Progress Notes (Signed)
Patient seen for trach clinic follow up.  No education given at this time, no equipment needs at this time. Patient still requiring vent support.  Will continue to follow for progression.

## 2016-11-14 NOTE — Progress Notes (Signed)
PT Cancellation Note  Patient Details Name: DERREN SUYDAM MRN: [id number] DOB: [birth year]   Cancelled Treatment:    Reason Eval/Treat Not Completed: Medical issues which prohibited therapy. Pt failed trach collar trial and is fatigued.   Angelina Ok Maycok 11/14/2016, 8:27 AM Skip Mayer PT [phone]

## 2016-11-14 NOTE — Progress Notes (Signed)
PULMONARY / CRITICAL CARE MEDICINE   Name: Jack Carter MRN: [id number] DOB: [birth year]    ADMISSION DATE:  10/31/2016 CONSULTATION DATE:  11/07/2016  REFERRING MD:  Dr. Grandville Silos   CHIEF COMPLAINT:  Acute Respiratory Distress   Brief:   83 yo male admitted with weakness.  Was being tx for cellulitis.  Found to have bradycardia and hypotension, hyperkalemia.  Required intubation for airway, but successfully extubated and transferred out of ICU.  Returned to ICU 9/04 with respiratory failure likely from aspiration and acute pulmonary edema.  PMHx of Parkinson disease, CAD s/p CABG, CHF. Course c/b NSTEMI w/ EF 40-45%, recurrent aspiration PNA and Renal failure.  ->now back on ICU service as of 9/12: this is his 3rd intubation.   SUBJECTIVE:  Desaturated on ATC and PSV attempt CXR looks worse   VITAL SIGNS: BP (!) 109/53 (BP Location: Right Arm)   Pulse 94   Temp (!) 100.4 F (38 C) (Oral)   Resp 17   Ht _0  (1.753 m)   Wt 179 lb 14.3 oz (81.6 kg)   SpO2 93%   BMI 26.57 kg/m   HEMODYNAMICS:    VENTILATOR SETTINGS: Vent Mode: PRVC FiO2 (%):  [40 %-50 %] 40 % Set Rate:  [18 bmp] 18 bmp Vt Set:  [570 mL] 570 mL PEEP:  [5 cmH20] 5 cmH20 Plateau Pressure:  [13 cmH20-20 cmH20] 19 cmH20  INTAKE / OUTPUT:  Intake/Output Summary (Last 24 hours) at 11/14/16 0820 Last data filed at 11/14/16 0800  Gross per 24 hour  Intake          2652.83 ml  Output             1075 ml  Net          1577.83 ml     PHYSICAL EXAMINATION: General appearance: chronically ill appearing 83 Year old  Male well nourished, sedated on vent Eyes: anicteric sclerae, moist conjunctivae; PERRL, EOMI bilaterally. Mouth:  membranes and no mucosal ulcerations; normal hard and soft palate Neck: Trachea midline; neck supple, no JVD, cuffed trach unremarkable  Lungs/chest: basilar rales, with normal respiratory effort and no intercostal retractions CV: Regular irreg  Abdomen: Soft, non-tender; no  masses or HSM Extremities: trace peripheral edema or extremity lymphadenopathy Skin: Normal temperature, turgor and texture; no rash, ulcers or subcutaneous nodules Psych: sedated on vent. + tremor    BMET  Recent Labs Lab 11/12/16 0234 11/13/16 0126 11/14/16 0227  NA 148* 143 139  K 3.3* 3.1* 4.3  CL 114* 108 108  CO2 _1 BUN 37* 33* 38*  CREATININE 2.33* 2.05* 2.16*  GLUCOSE 125* 109* 109*    Electrolytes  Recent Labs Lab 11/08/16 0740  11/10/16 0514 11/11/16 0153 11/12/16 0234 11/13/16 0126 11/14/16 0227  CALCIUM 7.9*  < > 8.0* 7.9* 7.9* 7.5* 7.6*  MG 1.7  --  2.0 2.1  --   --   --   < > = values in this interval not displayed.  CBC  Recent Labs Lab 11/12/16 0234 11/13/16 0126 11/14/16 0227  WBC 12.0* 11.1* 10.5  HGB 10.0* 9.5* 8.9*  HCT 32.8* 31.5* 29.8*  PLT 223 252 282    Coag's No results for input(s): APTT, INR in the last 168 hours.  Sepsis Markers  Recent Labs Lab 11/12/16 1843 11/13/16 0126 11/14/16 0227  PROCALCITON 0.33 0.43 0.40    ABG  Recent Labs Lab 11/09/16 1318 11/12/16 1831  PHART 7.523* 7.361  PCO2ART 36.3 50.6*  PO2ART 75.0* 239.0*    Liver Enzymes  Recent Labs Lab 11/13/16 0126 11/14/16 0227  AST 46* 46*  ALT 20 11*  ALKPHOS 123 114  BILITOT 1.0 1.0  ALBUMIN 1.6* 1.5*    Cardiac Enzymes  Recent Labs Lab 11/07/16 1814 11/07/16 2327 11/08/16 0740  TROPONINI 0.82* 1.04* 1.10*    Glucose  Recent Labs Lab 11/13/16 1113 11/13/16 1602 11/13/16 1941 11/14/16 0023 11/14/16 0355 11/14/16 0724  GLUCAP 99 106* 123* 132* 124* 136*    Imaging Dg Chest Port 1 View  Result Date: 11/14/2016 CLINICAL DATA:  Pneumonia. EXAM: PORTABLE CHEST 1 VIEW COMPARISON:  11/13/2016. FINDINGS: Tracheostomy tube and feeding tube in stable position. Prior CABG. Heart size stable. Diffuse bilateral airspace disease again noted without interim change. Small left pleural effusion cannot be excluded. IMPRESSION: 1.  Tracheostomy tube and feeding tube in stable position. 2. Diffuse bilateral airspace disease without significant interim change. Small left pleural effusion cannot be excluded . Electronically Signed   By: Marcello Moores  Register   On: 11/14/2016 06:26   Dg Chest Port 1 View  Result Date: 11/13/2016 CLINICAL DATA:  Acute respiratory failure, coronary artery disease, Parkinson's disease EXAM: PORTABLE CHEST 1 VIEW COMPARISON:  Portable chest x-ray of 11/13/2012 and CT chest of 11/10/2012 FINDINGS: There has been worsening of patchy airspace disease bilaterally most consistent with multifocal pneumonia. There may be small effusions present. Cardiomegaly is stable. Tracheostomy is now present. Median sternotomy sutures are noted and a feeding tube is present. IMPRESSION: Worsening of patchy airspace disease bilaterally most consistent with multifocal pneumonia. Electronically Signed   By: Ivar Drape M.D.   On: 11/13/2016 14:22    STUDIES:  CT head 9/01 > chronic microvascular ischemic changes, Rt maxillary sinus opacification Echo 9/03 > EF 40 to 45%, grade 2 DD, mild/mod MR, severe TR, PAS 60 mmHg  CULTURES: Blood 8/31 >> negative Urine 9/01 >> negative Blood 9/4 > GPC in clusters > staphylococcus    Urine Strep 9/4> Negative Urine Culture 9/4> negative Urine Legionella 9/4> negative Sputum Culture 9/4 > few candida albicans  BAL 9/13:  ANTIBIOTICS: Zosyn  8/31>>9/3 Unasyn 9/3>>9/4 Vanc 8/31>>8/31 Vanc 9/4>>9/06 Zosyn 9/4>> Meropenem 9/12>>> vanc 9/12>>>  SIGNIFICANT EVENTS: Intubated 8/31 > 9/2 Re-intubated 9/4 > 9/05  LINES/TUBES: femoral CVC > 9/6 trach 9/13>>>  DISCUSSION: 83 yo male admitted with septic shock with cellulitis, cardiogenic shock with bradycardia, and hyperkalemia from bactrim.  Developed recurrent respiratory failure after aspiration event and acute pulmonary edema 9/04.  Also had encephalopathy related to medications.  Recurrent respiratory failure (intubation  #3) d/t aspiration event +/- edema. Failed BIPAP and re-intubated & now trach'd 9/13 -failed weaning attempt-->CXR worrisome for ALI but given hx edema is a concern.  For today Place CVL to assess volume status -decide on diuretics -cont supportive care    ASSESSMENT / PLAN:  Acute hypoxic respiratory failure from aspiration w/ ALI and pulmonary edema indeterminate V/Q scan PCXR personally reviewed: trach is in good position. He has diffuse patchy pulmonary infiltrates. This likely represents pulmonary edema but suspect element of ALI.  Plan  Cont full vent support PAD protocol -1 Day 3 meropenem day 3 fluconazole F/u BAL Hep gtt for possible PE  Lasix x 1 (if CVP supports)  NSTEMI > Inferior ischemia, Anterolateral ischemia ? 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 Now up a little. Don't know what his baseline is.  Suspect that there is an element of CRI Plan Will KVO IVFs Assure MAP > 65 Would benefit from CVL to assess CVP    Dysphagia Plan  Resumed tubefeeds NPO PPI  Anemia of critical illness and chronic disease-->dilutional anemia.  Plan  Trend cbc Heparin gtt  Hyperglycemia Plan  ssi   Acute metabolic encephalopathy. H/O Parkinson disease, restless leg syndrome Plan Amantadine tid, sinenet IR tid and mysoline  PAD protocol goal 0   - Inter-disciplinary family meet or Palliative Care meeting due by: Ongoing   My cct 34 min Erick Colace ACNP-BC [place] Pager # [phone] OR # [phone] if no answer

## 2016-11-14 NOTE — Progress Notes (Signed)
PT Cancellation Note  Patient Details Name: AYANSH FEUTZ MRN: [id number] DOB: 1933-03-23   Cancelled Treatment:    Reason Eval/Treat Not Completed: Medical issues which prohibited therapy. Pt getting bath and now anxious. Will follow up next week.   Angelina Ok [place] 11/14/2016, 2:30 PM [employer] PT [phone]

## 2016-11-14 NOTE — Progress Notes (Addendum)
ANTICOAGULATION CONSULT NOTE  Pharmacy Consult for heparin Indication: PE  No Known Allergies  Patient Measurements: Height:  (175.3 cm) Weight: 179 lb 14.3 oz (81.6 kg) IBW/kg (Calculated) : 70.7 Heparin Dosing Weight: 83 kg  Vital Signs: Temp: 100.4 F (38 C) (09/14 0721) Temp Source: Oral (09/14 0721) BP: 107/67 (09/14 0900) Pulse Rate: 89 (09/14 0900)  Labs:  Recent Labs  11/12/16 0234 11/13/16 0126 11/13/16 2244 11/14/16 0227 11/14/16 0851  HGB 10.0* 9.5*  --  8.9*  --   HCT 32.8* 31.5*  --  29.8*  --   PLT 223 252  --  282  --   HEPARINUNFRC 0.34 0.26* 0.21*  --  0.39  CREATININE 2.33* 2.05*  --  2.16*  --     Estimated Creatinine Clearance: 26.8 mL/min (A) (by C-G formula based on SCr of 2.16 mg/dL (H)).  Assessment: 83yoM with new EKG changes. Possible NSTEMI  Heparin level is therapeutic. CBC stable and no s/s bleeding noted.   Indeterminate PE on VQ scan  Goal of Therapy:  Heparin level 0.3-0.7 units/ml Monitor platelets by anticoagulation protocol: Yes   Plan:  Continue heparin gtt 1650 units/hr Daily heparin level and CBC Monitor for s/s bleeding   Isaac Bliss, PharmD, BCPS, BCCCP Clinical Pharmacist Clinical phone for 11/14/2016 from 7a-3:30p: [phone] If after 3:30p, please call main pharmacy at: [phone] 11/14/2016 9:31 AM

## 2016-11-14 NOTE — Progress Notes (Signed)
RN notified of pt temp of 100.4 f orally.

## 2016-11-14 NOTE — Progress Notes (Signed)
Attempted to place patient on 40% trach collar.  Patient sats dropped to 83% with a good waveform.  Placed patient back on ventilator on PSV/CPAP however sats still did not improve to above 88%.  Switched patient back to full support.  Sats currently 97%.  Will continue to monitor.

## 2016-11-15 ENCOUNTER — Inpatient Hospital Stay (HOSPITAL_COMMUNITY): Payer: Medicare Other

## 2016-11-15 LAB — HEPARIN LEVEL (UNFRACTIONATED): HEPARIN UNFRACTIONATED: 0.3 [IU]/mL (ref 0.30–0.70)

## 2016-11-15 LAB — BASIC METABOLIC PANEL
ANION GAP: 3 — AB (ref 5–15)
BUN: 40 mg/dL — AB (ref 6–20)
CO2: 28 mmol/L (ref 22–32)
Calcium: 7.7 mg/dL — ABNORMAL LOW (ref 8.9–10.3)
Chloride: 110 mmol/L (ref 101–111)
Creatinine, Ser: 2.05 mg/dL — ABNORMAL HIGH (ref 0.61–1.24)
GFR, EST AFRICAN AMERICAN: 33 mL/min — AB (ref >60)
GFR, EST NON AFRICAN AMERICAN: 29 mL/min — AB (ref >60)
Glucose, Bld: 127 mg/dL — ABNORMAL HIGH (ref 65–99)
POTASSIUM: 4.7 mmol/L (ref 3.5–5.1)
SODIUM: 141 mmol/L (ref 135–145)

## 2016-11-15 LAB — GLUCOSE, CAPILLARY
GLUCOSE-CAPILLARY: 122 mg/dL — AB (ref 65–99)
GLUCOSE-CAPILLARY: 129 mg/dL — AB (ref 65–99)
GLUCOSE-CAPILLARY: 138 mg/dL — AB (ref 65–99)
GLUCOSE-CAPILLARY: 158 mg/dL — AB (ref 65–99)
Glucose-Capillary: 147 mg/dL — ABNORMAL HIGH (ref 65–99)
Glucose-Capillary: 148 mg/dL — ABNORMAL HIGH (ref 65–99)

## 2016-11-15 LAB — CBC
HEMATOCRIT: 28.2 % — AB (ref 39.0–52.0)
Hemoglobin: 8.5 g/dL — ABNORMAL LOW (ref 13.0–17.0)
MCH: 29.7 pg (ref 26.0–34.0)
MCHC: 30.1 g/dL (ref 30.0–36.0)
MCV: 98.6 fL (ref 78.0–100.0)
Platelets: 298 10*3/uL (ref 150–400)
RBC: 2.86 MIL/uL — AB (ref 4.22–5.81)
RDW: 17.3 % — AB (ref 11.5–15.5)
WBC: 11.4 10*3/uL — AB (ref 4.0–10.5)

## 2016-11-15 LAB — MAGNESIUM: Magnesium: 2.4 mg/dL (ref 1.7–2.4)

## 2016-11-15 MED ORDER — FUROSEMIDE 40 MG PO TABS
40.0000 mg | ORAL_TABLET | Freq: Two times a day (BID) | ORAL | Status: DC
  Administered 2016-11-15 (×2): 40 mg
  Filled 2016-11-15 (×3): qty 1

## 2016-11-15 NOTE — Progress Notes (Signed)
PULMONARY / CRITICAL CARE MEDICINE   Name: Jack Carter MRN: [id number] DOB: [birth year]    ADMISSION DATE:  10/31/2016 CONSULTATION DATE:  11/07/2016  REFERRING MD:  Dr. Grandville Silos   CHIEF COMPLAINT:  Acute Respiratory Distress   Brief:   83 yo male admitted with weakness.  Was being tx for cellulitis.  Found to have bradycardia and hypotension, hyperkalemia.  Required intubation for airway, but successfully extubated and transferred out of ICU.  Returned to ICU 9/04 with respiratory failure likely from aspiration and acute pulmonary edema.  PMHx of Parkinson disease, CAD s/p CABG, CHF. Course c/b NSTEMI w/ EF 40-45%, recurrent aspiration PNA and Renal failure.  ->now back on ICU service as of 9/12: this is his 3rd intubation.   SUBJECTIVE:  Stable on vent No events overnight  VITAL SIGNS: BP (!) 110/52   Pulse 87   Temp 99.7 F (37.6 C) (Oral)   Resp 16   Ht _0  (1.753 m)   Wt 179 lb 14.3 oz (81.6 kg)   SpO2 93%   BMI 26.57 kg/m   HEMODYNAMICS: CVP:  [4 mmHg-19 mmHg] 7 mmHg  VENTILATOR SETTINGS: Vent Mode: PRVC FiO2 (%):  [40 %-60 %] 50 % Set Rate:  [18 bmp-24 bmp] 24 bmp Vt Set:  [430 mL-570 mL] 430 mL PEEP:  [5 cmH20] 5 cmH20 Plateau Pressure:  [13 cmH20] 13 cmH20  INTAKE / OUTPUT:  Intake/Output Summary (Last 24 hours) at 11/15/16 1013 Last data filed at 11/15/16 0900  Gross per 24 hour  Intake          2110.63 ml  Output             2350 ml  Net          -239.37 ml     PHYSICAL EXAMINATION: Gen:      No acute distress HEENT:  EOMI, sclera anicteric Neck:     No masses; no thyromegaly, trach Lungs:    Clear to auscultation bilaterally; normal respiratory effort CV:         Regular rate and rhythm; no murmurs Abd:      + bowel sounds; soft, non-tender; no palpable masses, no distension Ext:    No edema; adequate peripheral perfusion Skin:      Warm and dry; no rash Neuro: Sedated  BMET  Recent Labs Lab 11/13/16 0126 11/14/16 0227  11/15/16 0417  NA 143 139 141  K 3.1* 4.3 4.7  CL 108 108 110  CO2 _1 BUN 33* 38* 40*  CREATININE 2.05* 2.16* 2.05*  GLUCOSE 109* 109* 127*    Electrolytes  Recent Labs Lab 11/10/16 0514 11/11/16 0153  11/13/16 0126 11/14/16 0227 11/15/16 0417  CALCIUM 8.0* 7.9*  < > 7.5* 7.6* 7.7*  MG 2.0 2.1  --   --   --  2.4  < > = values in this interval not displayed.  CBC  Recent Labs Lab 11/13/16 0126 11/14/16 0227 11/15/16 0417  WBC 11.1* 10.5 11.4*  HGB 9.5* 8.9* 8.5*  HCT 31.5* 29.8* 28.2*  PLT 252 282 298    Coag's No results for input(s): APTT, INR in the last 168 hours.  Sepsis Markers  Recent Labs Lab 11/12/16 1843 11/13/16 0126 11/14/16 0227  PROCALCITON 0.33 0.43 0.40    ABG  Recent Labs Lab 11/09/16 1318 11/12/16 1831  PHART 7.523* 7.361  PCO2ART 36.3 50.6*  PO2ART 75.0* 239.0*    Liver Enzymes  Recent Labs Lab 11/13/16 0126 11/14/16  0227  AST 46* 46*  ALT 20 11*  ALKPHOS 123 114  BILITOT 1.0 1.0  ALBUMIN 1.6* 1.5*    Cardiac Enzymes No results for input(s): TROPONINI, PROBNP in the last 168 hours.  Glucose  Recent Labs Lab 11/14/16 1208 11/14/16 1543 11/14/16 1927 11/14/16 2315 11/15/16 0303 11/15/16 0743  GLUCAP 100* 147* 149* 120* 147* 129*    Imaging Dg Chest Port 1 View  Result Date: 11/14/2016 CLINICAL DATA:  Post central line placement EXAM: PORTABLE CHEST 1 VIEW COMPARISON:  11/14/2016 FINDINGS: Right Central line placement with the tip in the SVC. No pneumothorax. Tracheostomy tube is unchanged. Prior CABG. Mild cardiomegaly. Severe diffuse bilateral airspace disease, not significantly changed. IMPRESSION: Right central line tip in the SVC.  No pneumothorax. Stable severe diffuse bilateral airspace disease. Electronically Signed   By: Rolm Baptise M.D.   On: 11/14/2016 12:08   STUDIES:  CT head 9/01 > chronic microvascular ischemic changes, Rt maxillary sinus opacification Echo 9/03 > EF 40 to 45%, grade  2 DD, mild/mod MR, severe TR, PAS 60 mmHg  CULTURES: Blood 8/31 >> negative Urine 9/01 >> negative Blood 9/4 > GPC in clusters > staphylococcus    Urine Strep 9/4> Negative Urine Culture 9/4> negative Urine Legionella 9/4> negative Sputum Culture 9/4 > few candida albicans  BAL 9/13:   ANTIBIOTICS: Zosyn  8/31>>9/3 Unasyn 9/3>>9/4 Vanc 8/31>>8/31 Vanc 9/4>>9/06 Zosyn 9/4>> 10 Diflucan 9/7 >9 9/14 Meropenem 9/12>>> Vanc 9/12>>>  SIGNIFICANT EVENTS: Intubated 8/31 > 9/2 Re-intubated 9/4 > 9/05  LINES/TUBES: femoral CVC > 9/6 > 9/11 Rt Spiro CVL 9/14 Trach 9/13>>>  DISCUSSION: 83 yo male admitted with septic shock with cellulitis, cardiogenic shock with bradycardia, and hyperkalemia from bactrim.  Developed recurrent respiratory failure after aspiration event and acute pulmonary edema 9/04.  Also had encephalopathy related to medications.  Recurrent respiratory failure (intubation #3) d/t aspiration event +/- edema. Failed BIPAP and re-intubated & now trach'd 9/13  ASSESSMENT / PLAN: Acute hypoxic respiratory failure from aspiration w/ ALI and pulmonary edema indeterminate V/Q scan Likely has pulm edema Plan  Continue vent support Wean as tolerated Continue meropenem, vanco F/u BAL Hep gtt for possible PE   NSTEMI > Inferior ischemia, Anterolateral ischemia ? 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 Now up a little. Don't know what his baseline is.  Suspect that there is an element of CRI Plan Monitor urine output and Cr  Dysphagia Plan  Tubefeeds NPO PPI  Anemia of critical illness and chronic disease-->dilutional anemia.  Plan  Trend cbc Heparin gtt  Hyperglycemia Plan  SSI  Acute metabolic encephalopathy. H/O Parkinson disease, restless  leg syndrome Plan Amantadine tid, sinenet IR tid and mysoline  PAD protocol goal 0   - Inter-disciplinary family meet or Palliative Care meeting due by: Ongoing   The patient is critically ill with multiple organ system failure and requires high complexity decision making for assessment and support, frequent evaluation and titration of therapies, advanced monitoring, review of radiographic studies and interpretation of complex data.   Critical Care Time devoted to patient care services, exclusive of separately billable procedures, described in this note is 35 minutes.   Marshell Garfinkel MD [facility] Pager [phone] If no answer or after 3pm call: [phone]  11/15/2016, 10:13 AM

## 2016-11-15 NOTE — Progress Notes (Signed)
Pharmacy Antibiotic Note  Jack Carter is a 83 y.o. male admitted on 10/31/2016 with pneumonia /HCAP.  Pharmacy has been consulted by CCM for Merrem and Vancomyicn dosing. Patient continues on antibiotics to assist with weaning/respiratory status.   WBC up slightly at 11.4 today.   SCR peak 2.38, now 2.05 with estimated CrCl ~29 ml/min.  Tmax 24 101.4.  Plan: Continue Merrem 1g IV every 12 hours.  Continue Vancomycin 743m IV every 24 hours.  Will check VT tomorrow if continued Monitor renal function, cultures    Height: _0  (175.3 cm) Weight: 179 lb 14.3 oz (81.6 kg) IBW/kg (Calculated) : 70.7  Temp (24hrs), Avg:100.2 F (37.9 C), Min:99.4 F (37.4 C), Max:101.4 F (38.6 C)   Recent Labs Lab 11/11/16 0153 11/12/16 0234 11/13/16 0126 11/14/16 0227 11/15/16 0417  WBC 12.9* 12.0* 11.1* 10.5 11.4*  CREATININE 2.38* 2.33* 2.05* 2.16* 2.05*    Estimated Creatinine Clearance: 28.3 mL/min (A) (by C-G formula based on SCr of 2.05 mg/dL (H)).    No Known Allergies  Antimicrobials this admission: Zosyn 8/31 >> 9/3; 9/4 >> 9/10 Fluconazole 9/7 >>9/14 Vancomycin 8/31 >> 9/2; 9/4 >> 9/6; 9/12 >> Unasyn 9/3 >> 9/4 Merrem 9/12 >>  Dose adjustments this admission:   Microbiology results: 9/4 respCx> mod candida albicans, few krusei 9/4 BCx> 1/2 coag neg staph species 9/4 ID panel> staph species, not aureus 8/31 Blood x 2>> ngtd 9/1 Urine>> No growth 9/1 MRSA PCR > neg 9/11 resp rare candida krusei 9/12 blood x 2- NGTD 9/12 urine- No growth 9/13 resp- no organisms seen 9/13 BAL- no organisms seen   Thank you for allowing pharmacy to be a part of this patient's care.  EJimmy Footman PharmD PGY2 Infectious Diseases Pharmacy Resident  Pager: [phone] After hours, please call #[phone] 11/15/2016 10:30 AM

## 2016-11-15 NOTE — Progress Notes (Addendum)
ANTICOAGULATION CONSULT NOTE  Pharmacy Consult for heparin Indication: PE  No Known Allergies  Patient Measurements: Height:  (175.3 cm) Weight: 179 lb 14.3 oz (81.6 kg) IBW/kg (Calculated) : 70.7 Heparin Dosing Weight: 83 kg  Vital Signs: Temp: 99.7 F (37.6 C) (09/15 0741) Temp Source: Oral (09/15 0741) BP: 110/52 (09/15 0930) Pulse Rate: 87 (09/15 0930)  Labs:  Recent Labs  11/13/16 0126 11/13/16 2244 11/14/16 0227 11/14/16 0851 11/15/16 0417  HGB 9.5*  --  8.9*  --  8.5*  HCT 31.5*  --  29.8*  --  28.2*  PLT 252  --  282  --  298  HEPARINUNFRC 0.26* 0.21*  --  0.39 0.30  CREATININE 2.05*  --  2.16*  --  2.05*    Estimated Creatinine Clearance: 28.3 mL/min (A) (by C-G formula based on SCr of 2.05 mg/dL (H)).  Assessment: 83yoM with new EKG changes. Possible NSTEMI/PE  Heparin level is on the lower end of therapeutic.Will bump up slightly to ensure that patient does not go low. CBC stable and no s/s bleeding noted.   Indeterminate PE on VQ scan  Goal of Therapy:  Heparin level 0.3-0.7 units/ml Monitor platelets by anticoagulation protocol: Yes   Plan:  Increase heparin gtt to 1700 units/hr Daily heparin level and CBC Monitor for s/s bleeding   Sharin Mons, PharmD PGY2 Infectious Diseases Pharmacy Resident  Clinical phone for 11/15/2016 from 7a-3:30p: [phone] If after 3:30p, please call main pharmacy at: [phone] 11/15/2016 10:23 AM

## 2016-11-16 ENCOUNTER — Inpatient Hospital Stay (HOSPITAL_COMMUNITY): Payer: Medicare Other

## 2016-11-16 LAB — CBC
HEMATOCRIT: 27.2 % — AB (ref 39.0–52.0)
Hemoglobin: 8.2 g/dL — ABNORMAL LOW (ref 13.0–17.0)
MCH: 29.8 pg (ref 26.0–34.0)
MCHC: 30.1 g/dL (ref 30.0–36.0)
MCV: 98.9 fL (ref 78.0–100.0)
PLATELETS: 315 10*3/uL (ref 150–400)
RBC: 2.75 MIL/uL — ABNORMAL LOW (ref 4.22–5.81)
RDW: 17.3 % — AB (ref 11.5–15.5)
WBC: 9.7 10*3/uL (ref 4.0–10.5)

## 2016-11-16 LAB — GLUCOSE, CAPILLARY
GLUCOSE-CAPILLARY: 126 mg/dL — AB (ref 65–99)
GLUCOSE-CAPILLARY: 146 mg/dL — AB (ref 65–99)
GLUCOSE-CAPILLARY: 139 mg/dL — AB (ref 65–99)
Glucose-Capillary: 136 mg/dL — ABNORMAL HIGH (ref 65–99)
Glucose-Capillary: 140 mg/dL — ABNORMAL HIGH (ref 65–99)

## 2016-11-16 LAB — BASIC METABOLIC PANEL
ANION GAP: 4 — AB (ref 5–15)
BUN: 41 mg/dL — ABNORMAL HIGH (ref 6–20)
CALCIUM: 7.7 mg/dL — AB (ref 8.9–10.3)
CO2: 29 mmol/L (ref 22–32)
CREATININE: 1.89 mg/dL — AB (ref 0.61–1.24)
Chloride: 107 mmol/L (ref 101–111)
GFR calc Af Amer: 37 mL/min — ABNORMAL LOW (ref >60)
GFR, EST NON AFRICAN AMERICAN: 32 mL/min — AB (ref >60)
Glucose, Bld: 130 mg/dL — ABNORMAL HIGH (ref 65–99)
POTASSIUM: 4.6 mmol/L (ref 3.5–5.1)
Sodium: 140 mmol/L (ref 135–145)

## 2016-11-16 LAB — PHOSPHORUS: PHOSPHORUS: 4.7 mg/dL — AB (ref 2.5–4.6)

## 2016-11-16 LAB — CULTURE, BAL-QUANTITATIVE W GRAM STAIN: Culture: 400 — AB

## 2016-11-16 LAB — CULTURE, BAL-QUANTITATIVE

## 2016-11-16 LAB — HEPARIN LEVEL (UNFRACTIONATED): HEPARIN UNFRACTIONATED: 0.37 [IU]/mL (ref 0.30–0.70)

## 2016-11-16 LAB — MAGNESIUM: Magnesium: 2.2 mg/dL (ref 1.7–2.4)

## 2016-11-16 MED ORDER — FUROSEMIDE 40 MG PO TABS
60.0000 mg | ORAL_TABLET | Freq: Two times a day (BID) | ORAL | Status: DC
  Administered 2016-11-16 – 2016-11-18 (×6): 60 mg
  Filled 2016-11-16 (×7): qty 1

## 2016-11-16 NOTE — Progress Notes (Signed)
ANTICOAGULATION CONSULT NOTE  Pharmacy Consult for heparin Indication: PE  No Known Allergies  Patient Measurements: Height:  (175.3 cm) Weight: 182 lb 8.7 oz (82.8 kg) IBW/kg (Calculated) : 70.7 Heparin Dosing Weight: 83 kg  Vital Signs: Temp: 99.3 F (37.4 C) (09/16 0743) Temp Source: Oral (09/16 0743) BP: 147/65 (09/16 0930) Pulse Rate: 100 (09/16 0930)  Labs:  Recent Labs  11/14/16 0227 11/14/16 0851 11/15/16 0417 11/16/16 0425 11/16/16 0426  HGB 8.9*  --  8.5*  --  8.2*  HCT 29.8*  --  28.2*  --  27.2*  PLT 282  --  298  --  315  HEPARINUNFRC  --  0.39 0.30 0.37  --   CREATININE 2.16*  --  2.05*  --  1.89*    Estimated Creatinine Clearance: 30.7 mL/min (A) (by C-G formula based on SCr of 1.89 mg/dL (H)).  Assessment: 83yoM with new EKG changes. Possible NSTEMI/PE  Heparin level is therapeutic this morning on 1700 units/hr. H/H and platelets are stable. No signs of bleeding noted in chart.   Indeterminate PE on VQ scan  Goal of Therapy:  Heparin level 0.3-0.7 units/ml Monitor platelets by anticoagulation protocol: Yes   Plan:  Continue heparin gtt at 1700 units/hr Daily heparin level and CBC Monitor for s/s bleeding   Sharin Mons, PharmD PGY2 Infectious Diseases Pharmacy Resident  Clinical phone for 11/16/2016 from 7a-3:30p: [phone] If after 3:30p, please call main pharmacy at: [phone] 11/16/2016 10:55 AM

## 2016-11-16 NOTE — Progress Notes (Signed)
PULMONARY / CRITICAL CARE MEDICINE   Name: Jack Carter MRN: [id number] DOB: 19-Mar-1933    ADMISSION DATE:  10/31/2016 CONSULTATION DATE:  11/07/2016  REFERRING MD:  Dr. Grandville Silos   CHIEF COMPLAINT:  Acute Respiratory Distress   Brief:   83 yo male admitted with weakness.  Was being tx for cellulitis.  Found to have bradycardia and hypotension, hyperkalemia.  Required intubation for airway, but successfully extubated and transferred out of ICU.  Returned to ICU 9/04 with respiratory failure likely from aspiration and acute pulmonary edema.  PMHx of Parkinson disease, CAD s/p CABG, CHF. Course c/b NSTEMI w/ EF 40-45%, recurrent aspiration PNA and Renal failure.  ->now back on ICU service as of 9/12: this is his 3rd intubation.   SUBJECTIVE:  Weaning on PSV  VITAL SIGNS: BP (!) 123/59 (BP Location: Right Arm)   Pulse 97   Temp 99.3 F (37.4 C) (Oral)   Resp (!) 22   Ht _0  (1.753 m)   Wt 182 lb 8.7 oz (82.8 kg)   SpO2 97%   BMI 26.96 kg/m   HEMODYNAMICS: CVP:  [7 mmHg-12 mmHg] 7 mmHg  VENTILATOR SETTINGS: Vent Mode: PRVC FiO2 (%):  [50 %] 50 % Set Rate:  [24 bmp-25 bmp] 25 bmp Vt Set:  [430 mL] 430 mL PEEP:  [5 cmH20] 5 cmH20 Pressure Support:  [8 cmH20] 8 cmH20 Plateau Pressure:  [12 cmH20-18 cmH20] 16 cmH20  INTAKE / OUTPUT:  Intake/Output Summary (Last 24 hours) at 11/16/16 0833 Last data filed at 11/16/16 0820  Gross per 24 hour  Intake          4577.58 ml  Output             2105 ml  Net          2472.58 ml     PHYSICAL EXAMINATION: Gen:      No acute distress HEENT:  EOMI, sclera anicteric Neck:     No masses; no thyromegaly, trach in place Lungs:    Clear to auscultation bilaterally; normal respiratory effort CV:         Regular rate and rhythm; no murmurs Abd:      + bowel sounds; soft, non-tender; no palpable masses, no distension Ext:    No edema; adequate peripheral perfusion Skin:      Warm and dry; no rash Neuro:  Sedated  BMET  Recent Labs Lab 11/14/16 0227 11/15/16 0417 11/16/16 0426  NA 139 141 140  K 4.3 4.7 4.6  CL 108 110 107  CO2 _1 BUN 38* 40* 41*  CREATININE 2.16* 2.05* 1.89*  GLUCOSE 109* 127* 130*    Electrolytes  Recent Labs Lab 11/11/16 0153  11/14/16 0227 11/15/16 0417 11/16/16 0426  CALCIUM 7.9*  < > 7.6* 7.7* 7.7*  MG 2.1  --   --  2.4 2.2  PHOS  --   --   --   --  4.7*  < > = values in this interval not displayed.  CBC  Recent Labs Lab 11/14/16 0227 11/15/16 0417 11/16/16 0426  WBC 10.5 11.4* 9.7  HGB 8.9* 8.5* 8.2*  HCT 29.8* 28.2* 27.2*  PLT 282 298 315    Coag's No results for input(s): APTT, INR in the last 168 hours.  Sepsis Markers  Recent Labs Lab 11/12/16 1843 11/13/16 0126 11/14/16 0227  PROCALCITON 0.33 0.43 0.40    ABG  Recent Labs Lab 11/09/16 1318 11/12/16 1831  PHART 7.523* 7.361  PCO2ART 36.3 50.6*  PO2ART 75.0* 239.0*    Liver Enzymes  Recent Labs Lab 11/13/16 0126 11/14/16 0227  AST 46* 46*  ALT 20 11*  ALKPHOS 123 114  BILITOT 1.0 1.0  ALBUMIN 1.6* 1.5*    Cardiac Enzymes No results for input(s): TROPONINI, PROBNP in the last 168 hours.  Glucose  Recent Labs Lab 11/15/16 1151 11/15/16 1600 11/15/16 1932 11/15/16 2343 11/16/16 0438 11/16/16 0745  GLUCAP 138* 158* 148* 122* 126* 136*    Imaging No results found. STUDIES:  CT head 9/01 > chronic microvascular ischemic changes, Rt maxillary sinus opacification Echo 9/03 > EF 40 to 45%, grade 2 DD, mild/mod MR, severe TR, PAS 60 mmHg  CULTURES: Blood 8/31 >> negative Urine 9/01 >> negative Blood 9/4 > GPC in clusters > staphylococcus    Urine Strep 9/4> Negative Urine Culture 9/4> negative Urine Legionella 9/4> negative Sputum Culture 9/4 > few candida albicans  BAL 9/13 > negative to date  ANTIBIOTICS: Zosyn  8/31>>9/3 Unasyn 9/3>>9/4 Vanc 8/31>>8/31 Vanc 9/4>>9/06 Zosyn 9/4>> 10 Diflucan 9/7 >9 9/14 Meropenem  9/12>>> Vanc 9/12>>>  SIGNIFICANT EVENTS: Intubated 8/31 > 9/2 Re-intubated 9/4 > 9/05 Re intubated 9/12>  LINES/TUBES: Femoral CVC > 9/6 > 9/11 Rt [place] CVL 9/14 Trach 9/13>>>  DISCUSSION: 83 yo male admitted with septic shock with cellulitis, cardiogenic shock with bradycardia, and hyperkalemia from bactrim.  Developed recurrent respiratory failure after aspiration event and acute pulmonary edema 9/04.  Also had encephalopathy related to medications.  Recurrent respiratory failure (intubation #3) d/t aspiration event +/- edema. Failed BIPAP and re-intubated & now trach'd 9/13  ASSESSMENT / PLAN: Acute hypoxic respiratory failure from aspiration w/ ALI and pulmonary edema ARDS Indeterminate V/Q scan Plan  Continue vent support Wean as tolerated Continue meropenem, vanco Hep gtt for possible PE   NSTEMI > Inferior ischemia, Anterolateral ischemia ? Demand related to hypoxia vs pulmonary edema  Acute on Chronic Combined CHF (EF 40-45%) H/O CAD, Valvular Heart Diease  Pulmonary HTN Plan  Cont tele Heparin gtt Cont lipitor as asa Diurese, follow CVP  AKI - Improving Plan Monitor urine output and Cr  Dysphagia Plan  Tubefeeds NPO PPI  Anemia of critical illness and chronic disease-->dilutional anemia.  Plan  Trend cbc Heparin gtt  Hyperglycemia Plan  SSI  Acute metabolic encephalopathy. H/O Parkinson disease, restless leg syndrome Plan Amantadine tid, sinenet IR tid and mysoline  PAD protocol goal 0   - Inter-disciplinary family meet or Palliative Care meeting due by: Ongoing   The patient is critically ill with multiple organ system failure and requires high complexity decision making for assessment and support, frequent evaluation and titration of therapies, advanced monitoring, review of radiographic studies and interpretation of complex data.   Critical Care Time devoted to patient care services, exclusive of separately billable procedures, described  in this note is 35 minutes.   Marshell Garfinkel MD [facility] Pager [phone] If no answer or after 3pm call: [phone] 11/16/2016, 8:33 AM

## 2016-11-17 LAB — GLUCOSE, CAPILLARY
GLUCOSE-CAPILLARY: 157 mg/dL — AB (ref 65–99)
GLUCOSE-CAPILLARY: 167 mg/dL — AB (ref 65–99)
GLUCOSE-CAPILLARY: 142 mg/dL — AB (ref 65–99)
Glucose-Capillary: 127 mg/dL — ABNORMAL HIGH (ref 65–99)
Glucose-Capillary: 133 mg/dL — ABNORMAL HIGH (ref 65–99)
Glucose-Capillary: 137 mg/dL — ABNORMAL HIGH (ref 65–99)
Glucose-Capillary: 153 mg/dL — ABNORMAL HIGH (ref 65–99)

## 2016-11-17 LAB — MAGNESIUM: MAGNESIUM: 2.1 mg/dL (ref 1.7–2.4)

## 2016-11-17 LAB — BASIC METABOLIC PANEL
ANION GAP: 7 (ref 5–15)
BUN: 43 mg/dL — AB (ref 6–20)
CALCIUM: 8 mg/dL — AB (ref 8.9–10.3)
CO2: 29 mmol/L (ref 22–32)
Chloride: 105 mmol/L (ref 101–111)
Creatinine, Ser: 1.75 mg/dL — ABNORMAL HIGH (ref 0.61–1.24)
GFR calc Af Amer: 40 mL/min — ABNORMAL LOW (ref >60)
GFR, EST NON AFRICAN AMERICAN: 35 mL/min — AB (ref >60)
Glucose, Bld: 146 mg/dL — ABNORMAL HIGH (ref 65–99)
POTASSIUM: 4.3 mmol/L (ref 3.5–5.1)
SODIUM: 141 mmol/L (ref 135–145)

## 2016-11-17 LAB — CULTURE, BLOOD (ROUTINE X 2)
CULTURE: NO GROWTH
Culture: NO GROWTH
SPECIAL REQUESTS: ADEQUATE
SPECIAL REQUESTS: ADEQUATE

## 2016-11-17 LAB — HEPARIN LEVEL (UNFRACTIONATED)
HEPARIN UNFRACTIONATED: 0.18 [IU]/mL — AB (ref 0.30–0.70)
Heparin Unfractionated: 0.16 IU/mL — ABNORMAL LOW (ref 0.30–0.70)

## 2016-11-17 LAB — CBC
HCT: 30.9 % — ABNORMAL LOW (ref 39.0–52.0)
Hemoglobin: 9.3 g/dL — ABNORMAL LOW (ref 13.0–17.0)
MCH: 29.3 pg (ref 26.0–34.0)
MCHC: 30.1 g/dL (ref 30.0–36.0)
MCV: 97.5 fL (ref 78.0–100.0)
PLATELETS: 340 10*3/uL (ref 150–400)
RBC: 3.17 MIL/uL — AB (ref 4.22–5.81)
RDW: 17.1 % — AB (ref 11.5–15.5)
WBC: 8.1 10*3/uL (ref 4.0–10.5)

## 2016-11-17 LAB — PHOSPHORUS: Phosphorus: 3.9 mg/dL (ref 2.5–4.6)

## 2016-11-17 MED ORDER — FENTANYL 25 MCG/HR TD PT72
25.0000 ug | MEDICATED_PATCH | TRANSDERMAL | Status: DC
  Administered 2016-11-17: 25 ug via TRANSDERMAL
  Filled 2016-11-17: qty 1

## 2016-11-17 MED ORDER — MIDAZOLAM HCL 2 MG/2ML IJ SOLN
1.0000 mg | INTRAMUSCULAR | Status: DC | PRN
  Administered 2016-11-17 – 2016-11-18 (×4): 1 mg via INTRAVENOUS
  Filled 2016-11-17 (×4): qty 2

## 2016-11-17 MED ORDER — METOPROLOL TARTRATE 25 MG PO TABS
25.0000 mg | ORAL_TABLET | Freq: Two times a day (BID) | ORAL | Status: DC
  Administered 2016-11-17 – 2016-11-18 (×3): 25 mg
  Filled 2016-11-17: qty 1
  Filled 2016-11-17: qty 2
  Filled 2016-11-17 (×3): qty 1

## 2016-11-17 MED ORDER — HEPARIN (PORCINE) IN NACL 100-0.45 UNIT/ML-% IJ SOLN
2000.0000 [IU]/h | INTRAMUSCULAR | Status: DC
  Administered 2016-11-17 (×2): 1900 [IU]/h via INTRAVENOUS
  Administered 2016-11-18 (×2): 2000 [IU]/h via INTRAVENOUS
  Filled 2016-11-17 (×5): qty 250

## 2016-11-17 MED ORDER — HYDRALAZINE HCL 20 MG/ML IJ SOLN: 10.0000 mg | INTRAMUSCULAR | Status: DC | PRN

## 2016-11-17 MED ORDER — LORAZEPAM 1 MG PO TABS
1.0000 mg | ORAL_TABLET | Freq: Two times a day (BID) | ORAL | Status: DC
  Administered 2016-11-17 – 2016-11-18 (×2): 1 mg via ORAL
  Filled 2016-11-17 (×2): qty 1

## 2016-11-17 NOTE — Progress Notes (Signed)
ANTICOAGULATION CONSULT NOTE - Follow Up Consult  Pharmacy Consult for Heparin Indication: NSTEMI and PE  No Known Allergies  Patient Measurements: Height:  (175.3 cm) Weight: 180 lb 8.9 oz (81.9 kg) IBW/kg (Calculated) : 70.7  Vital Signs: Temp: 98.9 F (37.2 C) (09/17 1138) Temp Source: Oral (09/17 1138) BP: 129/66 (09/17 1200) Pulse Rate: 97 (09/17 1200)  Labs:  Recent Labs  11/15/16 0417 11/16/16 0425 11/16/16 0426 11/17/16 0357 11/17/16 0950  HGB 8.5*  --  8.2* 9.3*  --   HCT 28.2*  --  27.2* 30.9*  --   PLT 298  --  315 340  --   HEPARINUNFRC 0.30 0.37  --  0.16* 0.18*  CREATININE 2.05*  --  1.89* 1.75*  --     Estimated Creatinine Clearance: 33.1 mL/min (A) (by C-G formula based on SCr of 1.75 mg/dL (H)).   Medications:  Heparin @ 1900 units/hr  Assessment: 83yom continues on heparin for NSTEMI and pulmonary embolism (intermediate probability on VQ scan) and meropenem for . Heparin level was drawn early this AM and was sub-therapeutic at 0.18. Currently off since to pull central line. H/H improved. Plt wnl. No s/s of bleeding noted.   Goal of Therapy:  Heparin level 0.3-0.7 units/ml Monitor platelets by anticoagulation protocol: Yes   Plan:  1) Restart heparin at 2000 units/hr WITHOUT bolus. Start 2 hours after central line was pulled 2) Check 8 hour heparin level  Vinnie Level, PharmD., BCPS Clinical Pharmacist Pager [phone]

## 2016-11-17 NOTE — Progress Notes (Signed)
ANTICOAGULATION CONSULT NOTE - Follow Up Consult  Pharmacy Consult for Heparin Indication: NSTEMI and PE  No Known Allergies  Patient Measurements: Height:  (175.3 cm) Weight: 180 lb 8.9 oz (81.9 kg) IBW/kg (Calculated) : 70.7  Vital Signs: Temp: 100.1 F (37.8 C) (09/17 0356) Temp Source: Oral (09/17 0356) BP: 143/61 (09/17 0400) Pulse Rate: 111 (09/17 0400)  Labs:  Recent Labs  11/15/16 0417 11/16/16 0425 11/16/16 0426 11/17/16 0357  HGB 8.5*  --  8.2* 9.3*  HCT 28.2*  --  27.2* 30.9*  PLT 298  --  315 340  HEPARINUNFRC 0.30 0.37  --  0.16*  CREATININE 2.05*  --  1.89* 1.75*    Estimated Creatinine Clearance: 33.1 mL/min (A) (by C-G formula based on SCr of 1.75 mg/dL (H)).   Medications:  Heparin @ 1700 units/hr  Assessment: 83yom continues on heparin for NSTEMI and pulmonary embolism (intermediate probability on VQ scan). Heparin level is low this morning at 0.16 after being therapeutic for several days. Spoke to RN - no issues/interruptions with infusion.  Goal of Therapy:  Heparin level 0.3-0.7 units/ml Monitor platelets by anticoagulation protocol: Yes   Plan:  1) Increase heparin to 1900 units/hr 2) Check 8 hour heparin level  Fredrik Rigger 11/17/2016,4:37 AM

## 2016-11-17 NOTE — Progress Notes (Signed)
[facility] TEAM 1 - Stepdown/ICU TEAM  Jack Carter  VEH:[id number] DOB: Aug 23, 1933 DOA: 10/31/2016 PCP: No primary care provider on file.    Brief Narrative:  83yo M w/ a hx of Parkinsons disease, CAD s/p CABG, and CHF who presented w/ a week of progressive weakness culminating in a fall on the day of his admit, following which he became markedly lethargic.  On arrival to the ED he was found to be severely bradycardicm, hyperkalemic, and hypotensive.   He required intubation for airway protection initially, but was successfully extubated and transferred out of ICU. He returned to the ICU on 9/04 with respiratory failure likely from aspiration and acute pulmonary edema.  After recovering from his second decline requiring intubation, he suffered a third, this time due to NSTEMI, recurrent aspiration, and renal failure.    Significant Events: 8/31 admitted - intubated 9/2 extubated  9/4 re-intubated 9/5 extubated  9/12 re-intubated  9/13 tracheostomy   Subjective: PCCM seeing pt today and attending to all active issues.    Assessment & Plan:  Acute hypoxic respiratory failure from aspiration w/ ARDS and pulmonary edema  Possible PE Indeterminate VQ - treating empirically w/ heparin gtt   Cardiogenic shock - acute on chronic systolic CHF    NSTEMI - inferior and anterolateral ischemia -  CAD s/p CABG   Acute kidney injury (unknown baseline)     Anemia of critical illness and chronic disease  Recurrent Aspiration - Dysphagia   Cellulitis   Metabolic encephalopathy / Parkinson's disease  prior to admission patient was able to consume his meals normally and converse  DVT prophylaxis:  Code Status: DNR - NO CODE Family Communication:  Disposition Plan:   Consultants:  PCCM Nephrology   Antimicrobials:  Zosyn 8/31>9/3 Unasyn 9/3>9/4 Vanc 8/31>8/31 Vanc 9/4>9/06 Zosyn 9/4>10 Diflucan 9/7 >9 9/14 Meropenem 9/12> Vanc 9/12>  Objective: Blood pressure  (!) 188/150, pulse (!) 115, temperature 98.8 F (37.1 C), temperature source Oral, resp. rate (!) 27, height _0  (1.753 m), weight 81.9 kg (180 lb 8.9 oz), SpO2 91 %.  Intake/Output Summary (Last 24 hours) at 11/17/16 0916 Last data filed at 11/17/16 0800  Gross per 24 hour  Intake          2332.67 ml  Output             3450 ml  Net         -1117.33 ml   Filed Weights   11/15/16 0424 11/16/16 0410 11/17/16 0405  Weight: 81.6 kg (179 lb 14.3 oz) 82.8 kg (182 lb 8.7 oz) 81.9 kg (180 lb 8.9 oz)    Examination: No exam from [facility] today   CBC:  Recent Labs Lab 11/11/16 0153 11/12/16 0234 11/13/16 0126 11/14/16 0227 11/15/16 0417 11/16/16 0426 11/17/16 0357  WBC 12.9* 12.0* 11.1* 10.5 11.4* 9.7 8.1  NEUTROABS 8.8* 7.9* 7.5  --   --   --   --   HGB 10.2* 10.0* 9.5* 8.9* 8.5* 8.2* 9.3*  HCT 33.5* 32.8* 31.5* 29.8* 28.2* 27.2* 30.9*  MCV 98.2 97.9 99.1 100.3* 98.6 98.9 97.5  PLT 195 223 252 282 298 315 836   Basic Metabolic Panel:  Recent Labs Lab 11/11/16 0153  11/13/16 0126 11/14/16 0227 11/15/16 0417 11/16/16 0426 11/17/16 0357  NA 149*  < > 143 139 141 140 141  K 3.1*  < > 3.1* 4.3 4.7 4.6 4.3  CL 113*  < > 108 108 110 107 105  CO2 28  < >  _0 GLUCOSE 98  < > 109* 109* 127* 130* 146*  BUN 38*  < > 33* 38* 40* 41* 43*  CREATININE 2.38*  < > 2.05* 2.16* 2.05* 1.89* 1.75*  CALCIUM 7.9*  < > 7.5* 7.6* 7.7* 7.7* 8.0*  MG 2.1  --   --   --  2.4 2.2 2.1  PHOS  --   --   --   --   --  4.7* 3.9  < > = values in this interval not displayed. GFR: Estimated Creatinine Clearance: 33.1 mL/min (A) (by C-G formula based on SCr of 1.75 mg/dL (H)).  Liver Function Tests:  Recent Labs Lab 11/13/16 0126 11/14/16 0227  AST 46* 46*  ALT 20 11*  ALKPHOS 123 114  BILITOT 1.0 1.0  PROT 6.4* 6.0*  ALBUMIN 1.6* 1.5*    CBG:  Recent Labs Lab 11/16/16 1650 11/16/16 1940 11/17/16 0004 11/17/16 0354 11/17/16 0821  GLUCAP 146* 139* 127* 137* 157*     Recent Results (from the past 240 hour(s))  Culture, expectorated sputum-assessment     Status: None   Collection Time: 11/10/16  4:16 PM  Result Value Ref Range Status   Specimen Description SPUTUM  Final   Special Requests NONE  Final   Sputum evaluation   Final    Sputum specimen not acceptable for testing.  Please recollect.   Results Called to: Dennison Bulla, RN AT 2055 ON 11/10/16 BY C. JESSUP, MLT.    Report Status 11/10/2016 FINAL  Final  Culture, expectorated sputum-assessment     Status: None   Collection Time: 11/11/16 12:35 AM  Result Value Ref Range Status   Specimen Description EXPECTORATED SPUTUM  Final   Special Requests NONE  Final   Sputum evaluation THIS SPECIMEN IS ACCEPTABLE FOR SPUTUM CULTURE  Final   Report Status 11/11/2016 FINAL  Final  Culture, respiratory (NON-Expectorated)     Status: None   Collection Time: 11/11/16 12:35 AM  Result Value Ref Range Status   Specimen Description EXPECTORATED SPUTUM  Final   Special Requests NONE Reflexed from [id number]  Final   Gram Stain   Final    ABUNDANT WBC PRESENT, PREDOMINANTLY PMN FEW SQUAMOUS EPITHELIAL CELLS PRESENT FEW YEAST RARE GRAM POSITIVE COCCI    Culture FEW CANDIDA KRUSEI  Final   Report Status 11/13/2016 FINAL  Final  Culture, blood (routine x 2)     Status: None (Preliminary result)   Collection Time: 11/12/16  6:41 PM  Result Value Ref Range Status   Specimen Description BLOOD LEFT HAND  Final   Special Requests   Final    BOTTLES DRAWN AEROBIC ONLY Blood Culture adequate volume   Culture NO GROWTH 4 DAYS  Final   Report Status PENDING  Incomplete  Culture, blood (routine x 2)     Status: None (Preliminary result)   Collection Time: 11/12/16  6:46 PM  Result Value Ref Range Status   Specimen Description BLOOD BLOOD RIGHT FOREARM  Final   Special Requests   Final    BOTTLES DRAWN AEROBIC ONLY Blood Culture adequate volume   Culture NO GROWTH 4 DAYS  Final   Report Status PENDING  Incomplete   Culture, Urine     Status: None   Collection Time: 11/12/16  9:50 PM  Result Value Ref Range Status   Specimen Description URINE, CLEAN CATCH  Final   Special Requests NONE  Final   Culture NO GROWTH  Final   Report  Status 11/14/2016 FINAL  Final  Culture, bal-quantitative     Status: Abnormal   Collection Time: 11/13/16 12:32 PM  Result Value Ref Range Status   Specimen Description BRONCHIAL ALVEOLAR LAVAGE  Final   Special Requests NONE  Final   Gram Stain   Final    FEW WBC PRESENT, PREDOMINANTLY PMN NO SQUAMOUS EPITHELIAL CELLS SEEN NO ORGANISMS SEEN    Culture 400 COLONIES/mL CANDIDA KRUSEI (A)  Final   Report Status 11/16/2016 FINAL  Final  Culture, respiratory (NON-Expectorated)     Status: None (Preliminary result)   Collection Time: 11/13/16  8:32 PM  Result Value Ref Range Status   Specimen Description TRACHEAL ASPIRATE  Final   Special Requests NONE  Final   Gram Stain   Final    FEW WBC PRESENT, PREDOMINANTLY MONONUCLEAR RARE SQUAMOUS EPITHELIAL CELLS PRESENT NO ORGANISMS SEEN    Culture FEW YEAST CULTURE REINCUBATED FOR BETTER GROWTH   Final   Report Status PENDING  Incomplete     Scheduled Meds: . amantadine  100 mg Oral TID  . aspirin  81 mg Oral Daily  . atorvastatin  10 mg Oral QHS  . carbidopa-levodopa  1 tablet Oral TID  . chlorhexidine gluconate (MEDLINE KIT)  15 mL Mouth Rinse BID  . Chlorhexidine Gluconate Cloth  6 each Topical Daily  . feeding supplement (PRO-STAT SUGAR FREE 64)  30 mL Per Tube BID  . furosemide  60 mg Per Tube BID  . gabapentin  300 mg Per Tube Q8H  . Gerhardt's butt cream   Topical QID  . insulin aspart  0-9 Units Subcutaneous Q4H  . mouth rinse  15 mL Mouth Rinse QID  . pantoprazole sodium  40 mg Per Tube Daily  . pneumococcal 23 valent vaccine  0.5 mL Intramuscular Tomorrow-1000  . primidone  50 mg Oral Q12H  . rOPINIRole  2 mg Oral Q12H  . sodium chloride flush  10-40 mL Intracatheter Q12H     LOS: 17 days    Cherene Altes, MD [facility] Office  [phone] Pager - Text Page per Amion as per below:  On-Call/Text Page:      [email or web address]      password [email or web address]  If 7PM-7AM, please contact night-coverage [email or web address] Password [email or web address] 11/17/2016, 9:16 AM

## 2016-11-17 NOTE — Progress Notes (Signed)
75cc Fentanyl gtt wasted down sink. Witnessed by Aline August, RN.

## 2016-11-17 NOTE — Progress Notes (Signed)
eLink Physician-Brief Progress Note Patient Name: Jack Carter DOB: 30-Jun-1933 MRN: [id number]   Date of Service  11/17/2016  HPI/Events of Note  Nurse reports patient hypertensive. Somewhat agitated. Weaned off vasopressors previously. Diuresing with intermittent Lasix.   eICU Interventions  Hydralazine IV when necessary systolic blood pressure greater than 170      Intervention Category Major Interventions: Hypertension - evaluation and management  Lawanda Cousins 11/17/2016, 5:09 AM

## 2016-11-17 NOTE — Progress Notes (Addendum)
PULMONARY / CRITICAL CARE MEDICINE   Name: Jack Carter MRN: [id number] DOB: 1933/08/16    ADMISSION DATE:  10/31/2016 CONSULTATION DATE:  11/07/2016  REFERRING MD:  Dr. Grandville Silos   CHIEF COMPLAINT:  Acute Respiratory Distress   Brief:   83 yo male admitted with weakness.  Was being tx for cellulitis.  Found to have bradycardia and hypotension, hyperkalemia.  Required intubation for airway, but successfully extubated and transferred out of ICU.  Returned to ICU 9/04 with respiratory failure likely from aspiration and acute pulmonary edema.  PMHx of Parkinson disease, CAD s/p CABG, CHF. Course c/b NSTEMI w/ EF 40-45%, recurrent aspiration PNA and Renal failure.  ->now back on ICU service as of 9/12: this is his 3rd intubation.   SUBJECTIVE:  TC this am 1 hour Did 8 hours TC day prior, desat  VITAL SIGNS: BP (!) 188/150   Pulse (!) 115   Temp 98.8 F (37.1 C) (Oral)   Resp (!) 27   Ht _0  (1.753 m)   Wt 81.9 kg (180 lb 8.9 oz)   SpO2 91%   BMI 26.66 kg/m   HEMODYNAMICS:    VENTILATOR SETTINGS: Vent Mode: PRVC FiO2 (%):  [40 %-60 %] 60 % Set Rate:  [25 bmp] 25 bmp Vt Set:  [430 mL] 430 mL PEEP:  [5 cmH20] 5 cmH20 Plateau Pressure:  [15 cmH20-20 cmH20] 18 cmH20  INTAKE / OUTPUT:  Intake/Output Summary (Last 24 hours) at 11/17/16 0920 Last data filed at 11/17/16 0800  Gross per 24 hour  Intake          2332.67 ml  Output             3450 ml  Net         -1117.33 ml     PHYSICAL EXAMINATION: General: awake, alert Neuro: fc, nonfocal HEENT: trach clean PULM: ronchi, thick secretions CV:  s1 s2 no r/g GI: soft, BS wnl, no r Extremities:  No edema   BMET  Recent Labs Lab 11/15/16 0417 11/16/16 0426 11/17/16 0357  NA 141 140 141  K 4.7 4.6 4.3  CL 110 107 105  CO2 _1 BUN 40* 41* 43*  CREATININE 2.05* 1.89* 1.75*  GLUCOSE 127* 130* 146*    Electrolytes  Recent Labs Lab 11/15/16 0417 11/16/16 0426 11/17/16 0357  CALCIUM 7.7*  7.7* 8.0*  MG 2.4 2.2 2.1  PHOS  --  4.7* 3.9    CBC  Recent Labs Lab 11/15/16 0417 11/16/16 0426 11/17/16 0357  WBC 11.4* 9.7 8.1  HGB 8.5* 8.2* 9.3*  HCT 28.2* 27.2* 30.9*  PLT 298 315 340    Coag's No results for input(s): APTT, INR in the last 168 hours.  Sepsis Markers  Recent Labs Lab 11/12/16 1843 11/13/16 0126 11/14/16 0227  PROCALCITON 0.33 0.43 0.40    ABG  Recent Labs Lab 11/12/16 1831  PHART 7.361  PCO2ART 50.6*  PO2ART 239.0*    Liver Enzymes  Recent Labs Lab 11/13/16 0126 11/14/16 0227  AST 46* 46*  ALT 20 11*  ALKPHOS 123 114  BILITOT 1.0 1.0  ALBUMIN 1.6* 1.5*    Cardiac Enzymes No results for input(s): TROPONINI, PROBNP in the last 168 hours.  Glucose  Recent Labs Lab 11/16/16 1154 11/16/16 1650 11/16/16 1940 11/17/16 0004 11/17/16 0354 11/17/16 0821  GLUCAP 140* 146* 139* 127* 137* 157*    Imaging No results found. STUDIES:  CT head 9/01 > chronic microvascular ischemic changes, Rt maxillary  sinus opacification Echo 9/03 > EF 40 to 45%, grade 2 DD, mild/mod MR, severe TR, PAS 60 mmHg  CULTURES: Blood 8/31 >> negative Urine 9/01 >> negative Blood 9/4 > GPC in clusters > staphylococcus    Urine Strep 9/4> Negative Urine Culture 9/4> negative Urine Legionella 9/4> negative Sputum Culture 9/4 > few candida albicans  BAL 9/13 > negative to date  ANTIBIOTICS: Zosyn  8/31>>9/3 Unasyn 9/3>>9/4 Vanc 8/31>>8/31 Vanc 9/4>>9/06 Zosyn 9/4>> 10 Diflucan 9/7 >9 9/14 Meropenem 9/12>>> Vanc 9/12>>>  SIGNIFICANT EVENTS: Intubated 8/31 > 9/2 Re-intubated 9/4 > 9/05 Re intubated 9/12>  LINES/TUBES: Femoral CVC > 9/6 > 9/11 Rt [place] CVL 9/14 Trach 9/13 (df)>>>  DISCUSSION: 83 yo male admitted with septic shock with cellulitis, cardiogenic shock with bradycardia, and hyperkalemia from bactrim.  Developed recurrent respiratory failure after aspiration event and acute pulmonary edema 9/04.  Also had encephalopathy  related to medications.  Recurrent respiratory failure (intubation #3) d/t aspiration event +/- edema. Failed BIPAP and re-intubated & now trach'd 9/13  ASSESSMENT / PLAN: Acute hypoxic respiratory failure from aspiration w/ ALI and pulmonary edema ARDS Indeterminate V/Q scan Plan  Continue meropenem, vanco Hep gtt for possible PE TC goal 12 hours then rest vent tonight Secretions noted, keep cuff up Max diuresis, to role steroids  Maintain neg balance pcxr follow p for ali Max parkinons  NSTEMI > Inferior ischemia, Anterolateral ischemia ? 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  Plan  Trend cbc Heparin gtt  Hyperglycemia Plan  SSI  Acute metabolic encephalopathy. H/O Parkinson disease, restless leg syndrome Plan Amantadine tid, sinenet IR tid and mysoline  Early mobility _ PT Dc fent drip, add [patch  Lavon Paganini. Titus Mould, MD, [place] Pgr: [facility]

## 2016-11-17 NOTE — Progress Notes (Signed)
Physical Therapy Treatment Patient Details Name: Jack Carter MRN: [id number] DOB: May 10, 1933 Today's Date: 11/17/2016    History of Present Illness Pt admitted 10/31/16 after falling with LOC with cardiogenic shock, severe hypokalemia, AKI for rhabdo, acute hypoxic respiratory failure requiring intubation, extubated 11/05/16. Reintubated 9/12 and trached 9/13. PMH: CAD with CABG, CHF, R LE cellulitis, Parkinsons. Pt recently moved from [place] to his daughter's home.    PT Comments    This is pt's first PT session since being re-intubated and having trach placement. Previous goals reviewed and still remain appropriate. Pt agitated during session. He was continuously mouthing words and frustrated that he wasn't being understood. Pt provided with communication aids but was still unable to relay what he was trying to say.  There does appear to be some confusion. Pt very willing to participate with therapy. He was able to tolerate sitting EOB x 5 minutes with max assist.  Pt pushing into extension and to the left during sitting. Pt tolerated weaning trial on trach collar this AM. Pt back on vent support during session.  PT to continue per POC.    Follow Up Recommendations  SNF;Supervision for mobility/OOB     Equipment Recommendations  None recommended by PT    Recommendations for Other Services       Precautions / Restrictions Precautions Precautions: Fall;Other (comment) Precaution Comments: watch BP Restrictions Weight Bearing Restrictions: No    Mobility  Bed Mobility         Supine to sit: +2 for physical assistance;Mod assist Sit to supine: Max assist;+2 for physical assistance   General bed mobility comments: continuous verbal cues for sequencing. Bilat mitts in place during mobility for safety.  Transfers                 General transfer comment: unable  Ambulation/Gait             General Gait Details: unable   Stairs            Wheelchair  Mobility    Modified Rankin (Stroke Patients Only)       Balance   Sitting-balance support: Feet unsupported;Bilateral upper extremity supported Sitting balance-Leahy Scale: Zero Sitting balance - Comments: Pt sat EOB x 5 minutes with max assist. Postural control: Left lateral lean;Posterior lean                                  Cognition Arousal/Alertness: Awake/alert Behavior During Therapy: Anxious Overall Cognitive Status: Difficult to assess                     Current Attention Level: Sustained   Following Commands: Follows one step commands with increased time     Problem Solving: Slow processing;Decreased initiation;Difficulty sequencing;Requires verbal cues;Requires tactile cues        Exercises General Exercises - Lower Extremity Ankle Circles/Pumps: AROM;Both;10 reps;Supine Heel Slides: AROM;Right;Left;10 reps Hip ABduction/ADduction: AROM;Right;Left;10 reps Straight Leg Raises: AAROM;Right;Left;5 reps    General Comments        Pertinent Vitals/Pain Pain Assessment: Faces Faces Pain Scale: No hurt    Home Living                      Prior Function            PT Goals (current goals can now be found in the care plan section) Acute Rehab PT Goals Patient Stated  Goal: not stated PT Goal Formulation: With patient Time For Goal Achievement: 11/21/16 Potential to Achieve Goals: Fair Progress towards PT goals: Progressing toward goals (slowly due to reintubation)    Frequency    Min 3X/week      PT Plan Current plan remains appropriate    Co-evaluation              AM-PAC PT "6 Clicks" Daily Activity  Outcome Measure  Difficulty turning over in bed (including adjusting bedclothes, sheets and blankets)?: Unable Difficulty moving from lying on back to sitting on the side of the bed? : Unable Difficulty sitting down on and standing up from a chair with arms (e.g., wheelchair, bedside commode, etc,.)?:  Unable Help needed moving to and from a bed to chair (including a wheelchair)?: A Lot Help needed walking in hospital room?: A Lot Help needed climbing 3-5 steps with a railing? : Total 6 Click Score: 8    End of Session Equipment Utilized During Treatment: Oxygen Activity Tolerance: Patient tolerated treatment well Patient left: in bed;with call bell/phone within reach Nurse Communication: Mobility status PT Visit Diagnosis: Muscle weakness (generalized) (M62.81);Difficulty in walking, not elsewhere classified (R26.2)     Time: 1610-9604 PT Time Calculation (min) (ACUTE ONLY): 32 min  Charges:  $Therapeutic Exercise: 8-22 mins $Therapeutic Activity: 8-22 mins                    G Codes:       Aida Raider, PT  Office # [phone] Pager [phone]    Ilda Foil 11/17/2016, 1:55 PM

## 2016-11-18 ENCOUNTER — Other Ambulatory Visit (HOSPITAL_COMMUNITY): Payer: Self-pay

## 2016-11-18 ENCOUNTER — Inpatient Hospital Stay
Admission: RE | Admit: 2016-11-18 | Discharge: 2016-12-26 | Disposition: A | Discharge status: Skilled Nursing Facility | Payer: Self-pay | Source: Other Acute Inpatient Hospital | Attending: Internal Medicine | Admitting: Internal Medicine

## 2016-11-18 DIAGNOSIS — J189 Pneumonia, unspecified organism: Secondary | ICD-10-CM

## 2016-11-18 DIAGNOSIS — Z4659 Encounter for fitting and adjustment of other gastrointestinal appliance and device: Secondary | ICD-10-CM

## 2016-11-18 DIAGNOSIS — Z931 Gastrostomy status: Secondary | ICD-10-CM

## 2016-11-18 DIAGNOSIS — R633 Feeding difficulties, unspecified: Secondary | ICD-10-CM

## 2016-11-18 DIAGNOSIS — J398 Other specified diseases of upper respiratory tract: Secondary | ICD-10-CM

## 2016-11-18 DIAGNOSIS — Z430 Encounter for attention to tracheostomy: Secondary | ICD-10-CM

## 2016-11-18 DIAGNOSIS — Z431 Encounter for attention to gastrostomy: Secondary | ICD-10-CM

## 2016-11-18 LAB — GLUCOSE, CAPILLARY
GLUCOSE-CAPILLARY: 142 mg/dL — AB (ref 65–99)
GLUCOSE-CAPILLARY: 128 mg/dL — AB (ref 65–99)
Glucose-Capillary: 160 mg/dL — ABNORMAL HIGH (ref 65–99)
Glucose-Capillary: 122 mg/dL — ABNORMAL HIGH (ref 65–99)

## 2016-11-18 LAB — CBC
HCT: 28.2 % — ABNORMAL LOW (ref 39.0–52.0)
HEMOGLOBIN: 8.5 g/dL — AB (ref 13.0–17.0)
MCH: 29.5 pg (ref 26.0–34.0)
MCHC: 30.1 g/dL (ref 30.0–36.0)
MCV: 97.9 fL (ref 78.0–100.0)
PLATELETS: 397 10*3/uL (ref 150–400)
RBC: 2.88 MIL/uL — ABNORMAL LOW (ref 4.22–5.81)
RDW: 17.2 % — ABNORMAL HIGH (ref 11.5–15.5)
WBC: 7.4 10*3/uL (ref 4.0–10.5)

## 2016-11-18 LAB — CULTURE, RESPIRATORY

## 2016-11-18 LAB — BASIC METABOLIC PANEL
Anion gap: 7 (ref 5–15)
BUN: 46 mg/dL — AB (ref 6–20)
CALCIUM: 8.1 mg/dL — AB (ref 8.9–10.3)
CO2: 32 mmol/L (ref 22–32)
Chloride: 106 mmol/L (ref 101–111)
Creatinine, Ser: 1.71 mg/dL — ABNORMAL HIGH (ref 0.61–1.24)
GFR calc Af Amer: 41 mL/min — ABNORMAL LOW (ref >60)
GFR, EST NON AFRICAN AMERICAN: 36 mL/min — AB (ref >60)
Glucose, Bld: 130 mg/dL — ABNORMAL HIGH (ref 65–99)
Potassium: 4.1 mmol/L (ref 3.5–5.1)
Sodium: 145 mmol/L (ref 135–145)

## 2016-11-18 LAB — BLOOD GAS, ARTERIAL
Acid-Base Excess: 10.9 mmol/L — ABNORMAL HIGH (ref 0.0–2.0)
BICARBONATE: 35.6 mmol/L — AB (ref 20.0–28.0)
FIO2: 40
LHR: 25 {breaths}/min
O2 Saturation: 99.2 %
PATIENT TEMPERATURE: 97.7
PCO2 ART: 52.7 mmHg — AB (ref 32.0–48.0)
PEEP: 5 cmH2O
VT: 430 mL
pH, Arterial: 7.442 (ref 7.350–7.450)
pO2, Arterial: 182 mmHg — ABNORMAL HIGH (ref 83.0–108.0)

## 2016-11-18 LAB — CULTURE, RESPIRATORY W GRAM STAIN

## 2016-11-18 LAB — HEPARIN LEVEL (UNFRACTIONATED): Heparin Unfractionated: 0.36 IU/mL (ref 0.30–0.70)

## 2016-11-18 MED ORDER — CARBIDOPA-LEVODOPA 25-100 MG PO TABS: 1.0000 | ORAL_TABLET | Freq: Three times a day (TID) | ORAL | Status: DC

## 2016-11-18 MED ORDER — SODIUM CHLORIDE 0.9 % IV SOLN: 1.0000 g | Freq: Two times a day (BID) | INTRAVENOUS | Status: DC

## 2016-11-18 MED ORDER — PRO-STAT SUGAR FREE PO LIQD: 30.0000 mL | Freq: Two times a day (BID) | ORAL | 0 refills | Status: DC

## 2016-11-18 MED ORDER — GABAPENTIN 250 MG/5ML PO SOLN: 300.0000 mg | Freq: Three times a day (TID) | ORAL | 12 refills | Status: DC

## 2016-11-18 MED ORDER — ASPIRIN 81 MG PO CHEW: 81.0000 mg | CHEWABLE_TABLET | Freq: Every day | ORAL | Status: DC

## 2016-11-18 MED ORDER — ROPINIROLE HCL 2 MG PO TABS: 2.0000 mg | ORAL_TABLET | Freq: Two times a day (BID) | ORAL | Status: DC

## 2016-11-18 MED ORDER — INSULIN ASPART 100 UNIT/ML ~~LOC~~ SOLN: 0.0000 [IU] | SUBCUTANEOUS | 11 refills | Status: DC

## 2016-11-18 MED ORDER — AMANTADINE HCL 50 MG/5ML PO SYRP: 100.0000 mg | ORAL_SOLUTION | Freq: Three times a day (TID) | ORAL | 0 refills | Status: DC

## 2016-11-18 MED ORDER — GERHARDT'S BUTT CREAM: 1.0000 "application " | TOPICAL_CREAM | Freq: Four times a day (QID) | CUTANEOUS | Status: DC

## 2016-11-18 MED ORDER — ALBUTEROL SULFATE (2.5 MG/3ML) 0.083% IN NEBU: 2.5000 mg | INHALATION_SOLUTION | RESPIRATORY_TRACT | 12 refills | Status: DC | PRN

## 2016-11-18 MED ORDER — HEPARIN (PORCINE) IN NACL 100-0.45 UNIT/ML-% IJ SOLN: 2000.0000 [IU]/h | INTRAMUSCULAR | Status: DC

## 2016-11-18 MED ORDER — PANTOPRAZOLE SODIUM 40 MG PO PACK: 40.0000 mg | PACK | Freq: Every day | ORAL | Status: DC

## 2016-11-18 MED ORDER — MIDAZOLAM HCL 2 MG/2ML IJ SOLN: 1.0000 mg | INTRAMUSCULAR | 0 refills | Status: DC | PRN

## 2016-11-18 MED ORDER — FENTANYL 25 MCG/HR TD PT72: 25.0000 ug | MEDICATED_PATCH | TRANSDERMAL | 0 refills | Status: DC

## 2016-11-18 MED ORDER — LORAZEPAM 1 MG PO TABS: 1.0000 mg | ORAL_TABLET | Freq: Two times a day (BID) | ORAL | 0 refills | Status: DC

## 2016-11-18 MED ORDER — PNEUMOCOCCAL VAC POLYVALENT 25 MCG/0.5ML IJ INJ: 0.5000 mL | INJECTION | INTRAMUSCULAR | Status: AC

## 2016-11-18 MED ORDER — ORAL CARE MOUTH RINSE: 15.0000 mL | Freq: Four times a day (QID) | OROMUCOSAL | 0 refills | Status: DC

## 2016-11-18 MED ORDER — CHLORHEXIDINE GLUCONATE 0.12% ORAL RINSE (MEDLINE KIT): 15.0000 mL | Freq: Two times a day (BID) | OROMUCOSAL | 0 refills | Status: DC

## 2016-11-18 MED ORDER — PRIMIDONE 50 MG PO TABS: 50.0000 mg | ORAL_TABLET | Freq: Two times a day (BID) | ORAL | Status: AC

## 2016-11-18 MED ORDER — JEVITY 1.2 CAL PO LIQD: 1000.0000 mL | ORAL | 0 refills | Status: DC

## 2016-11-18 MED ORDER — METOPROLOL TARTRATE 25 MG PO TABS: 25.0000 mg | ORAL_TABLET | Freq: Two times a day (BID) | ORAL | Status: AC

## 2016-11-18 MED ORDER — RESOURCE THICKENUP CLEAR PO POWD: 1.0000 | ORAL | Status: DC | PRN

## 2016-11-18 MED ORDER — FUROSEMIDE 20 MG PO TABS: 60.0000 mg | ORAL_TABLET | Freq: Two times a day (BID) | ORAL | Status: DC

## 2016-11-18 MED ORDER — ATORVASTATIN CALCIUM 10 MG PO TABS: 10.0000 mg | ORAL_TABLET | Freq: Every day | ORAL | Status: AC

## 2016-11-18 NOTE — Discharge Summary (Signed)
Physician Discharge Summary       Patient ID: Jack Carter MRN: [id number] DOB/AGE: 81-Jul-1935 81 y.o.  Admit date: 10/31/2016 Discharge date: 11/18/2016  Discharge Diagnoses:  Acute hypoxic respiratory failure from aspiration w/ ALI and pulmonary edema ARDS Indeterminate V/Q scan NSTEMI >Inferior ischemia, Anterolateral ischemia ? 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  Prior to admit, he had been treated for cellulitis of his legs but unsure if he was compliant with medicines/ bactrim.  Additionally, patient with recent move from [place], therefore, limited PMH known.  After fall, he was unable to stand and then presented to ER with altered mental status, bradycardia, hyperkalemia >8, and hypotension.   He was intubated on 8/31 for hypoxic respiratory failure and treated for septic shock with fluid rescusitation, vasopressors, vancomycin and zosyn.  His acute kidney injury thought likely ATN from hypotension and bactrim.  Nephrology was consulted on 8/31 and felt that he was a poor dialysis candidate and therefore medically managed. He was extubated on 9/2 but returned back to the ICU on 9/4 requiring intubation for acute respiratory distress thought secondary to aspiration pneumonia and acute pulmonary edema.    Cardiology was consulted on 9/4 secondary to elevated troponin's and recurrent episodes of sudden respiratory distress concerning for flash pulmonary edema. TTE showed EF of 40-45%, G2DD, and  moderate mitral regurgitation.  His NSTEMI, without chest pain, was attributed to demand ischemia in the setting of poor renal function and critical illness.  He was not a candiate for invasive cardiac evaluation and threrefore, medical management with heparin and diuresis continued.    He was diuresed after being 6L positive and extubated on 9/5 and transferred out of ICU.  He intermittently required BiPAP and lasix of ongoing hypoxemia and respiratory distress but diuresis complicated secondary to hypernatremia.  A V/Q scan performed on 9/11 felt findings represent an intermediate probability for pulmonary embolism, in which patient was already on heparin drip.  Again, he was transferred to the ICU on 9/12, failed BiPAP, and was reintubated for the third time on 9/12.  Subsequently, a tracheostomy was placed on 9/13.  He was started on PSV trials on 9/15, unable to tolerate ATC trials.  On 9/18, patient accepted to [facility] for continued long term acute care.     Discharge Plan by active problems   Acute hypoxic respiratory failure from aspiration w/ ALI and pulmonary edema ARDS Indeterminate V/Q scan for PE Plan  Continue meropenem, vanco Hep gtt for possible PE Continue ATC trials per protocol Keep cuff up given secretions Do not change trach / downsize until at least 9/20 (7 days after trach placement) Max diuresis, no role steroids  Maintain neg balance CXR intermittently  NSTEMI >Inferior ischemia, Anterolateral ischemia ? Demand related to hypoxia vs pulmonary edema  Acute on Chronic Combined CHF (EF 40-45%) Hx CAD, Valvular Heart Diease  Pulmonary HTN Plan  Continue heparin gtt, ASA, lipitor, lopressor Maximize diuresis, goal neg balance  AKI - Improving Plan Maximize diuresis, goal neg balance Chem in am  Dysphagia Plan  NPO Continue tube feeds PPI SLP consideration when secretions better  Anemia of critical illness and chronic disease -  dilutional anemia Plan  Continue heparin gtt Trend CBC  Hyperglycemia Plan  SSI  Acute metabolic encephalopathy Hx Parkinson disease, restless leg syndrome Plan Continue Amantadine tid, sinemet IR tid and mysoline, gabapentin, ropinirole, fentanyl patch  Early mobility - continue PT   Significant Hospital tests/ studies   SIGNIFICANT DIAGNOSTIC STUDIES CT head 8/31 > chronic microvascular ischemic changes, Rt maxillary sinus opacification Renal US 8/31 > no obstruction. Echo 9/03 > EF 40 to 45%, grade 2 DD, mild/mod MR, severe TR, PAS 60 mmHg CT chest 9/10 > bilateral airspace disease with upper lobe predominance.  Small left and small to moderate right pleural effusion. VQ 9/11 > matching areas of impaired ventilation, perfusion, and radiographic abnormalities in the upper lobes bilaterally greater on right.  Intermediate probability for PE. CXR 9/16 > diffuse airspace disease  SIGNIFICANT EVENTS 8/31 - admitted, intubated 9/2 - extubated 9/4 - re-intubated 9/5 - extubated 9/12 - re-intubated 9/13 - tracheostomy  MICRO DATA  Blood 8/31 >> negative Urine 9/01 >> negative Blood 9/4 >GPC in clusters >staphylococcus  Urine Strep 9/4> Negative Urine Culture 9/4> negative Urine Legionella 9/4>negative Sputum Culture 9/4 >few candida albicans  BAL 9/13 > negative to date  ANTIBIOTICS Zosyn 8/31>>9/3 Unasyn 9/3>>9/4 Vanc 8/31>>8/31 Vanc 9/4>>9/06 Zosyn 9/4>> 10 Diflucan 9/7 >9 9/14 Meropenem 9/12>>> Vanc 9/12>>>  CONSULTS Nephrology Cardiology  TUBES / LINES Femoral CVC > 9/6 > 9/11 Rt [place] CVL 9/14 Trach 9/13 (df)>>>   Discharge Exam: BP 134/63 (BP Location: Right Arm)   Pulse 87   Temp 98.9 F (37.2 C) (Oral)   Resp 19   Ht _0  (1.753 m)   Wt 174 lb 6.1 oz (79.1 kg)   SpO2 94%   BMI 25.75 kg/m   General:  Elderly male lying in bed in NAD HEENT: MM pink/moist, sutured trach, PERRL, cortrak to R nare Neuro: Sedated, wakes up to  voice, follows commands, MAE CV: rrr PULM: even/non-labored on PSV,  lungs bilaterally coarse, scattered rhonchi GI: soft, non-tender, bs active, rectal tube in place Extremities: warm/dry, trace BLE edema  Skin: no rashes    Labs at discharge Lab Results  Component Value Date   CREATININE 1.71 (H) 11/18/2016   BUN 46 (H) 11/18/2016   NA 145 11/18/2016   K 4.1 11/18/2016   CL 106 11/18/2016   CO2 32 11/18/2016   Lab Results  Component Value Date   WBC 7.4 11/18/2016   HGB 8.5 (L) 11/18/2016   HCT 28.2 (L) 11/18/2016   MCV 97.9 11/18/2016   PLT 397 11/18/2016   Lab Results  Component Value Date   ALT 11 (L) 11/14/2016   AST 46 (H) 11/14/2016   ALKPHOS 114 11/14/2016   BILITOT 1.0 11/14/2016   Lab Results  Component Value Date   INR 1.10 10/31/2016    Current radiology studies No results found.  Disposition:  Final discharge disposition not confirmed  Discharge Instructions    Diet - low sodium heart healthy    Complete by:  As directed    Increase activity slowly    Complete by:  As directed      Allergies as of 11/18/2016   No Known Allergies     Medication List    STOP taking these medications   amantadine 100 MG capsule Commonly known as:  SYMMETREL Replaced by:  amantadine 50 MG/5ML  solution   aspirin EC 81 MG tablet Replaced by:  aspirin 81 MG chewable tablet   carbidopa-levodopa-entacapone 50-200-200 MG tablet Commonly known as:  STALEVO   CENTRUM SILVER 50+MEN Tabs   gabapentin 600 MG tablet Commonly known as:  NEURONTIN Replaced by:  gabapentin 250 MG/5ML solution   metolazone 5 MG tablet Commonly known as:  ZAROXOLYN   omeprazole 40 MG capsule Commonly known as:  PRILOSEC   potassium chloride SA 20 MEQ tablet Commonly known as:  K-DUR,KLOR-CON   propranolol 60 MG tablet Commonly known as:  INDERAL   traMADol 50 MG tablet Commonly known as:  ULTRAM   trimethobenzamide 300 MG capsule Commonly known as:  TIGAN   valsartan  160 MG tablet Commonly known as:  DIOVAN     TAKE these medications   albuterol (2.5 MG/3ML) 0.083% nebulizer solution Commonly known as:  PROVENTIL Take 3 mLs (2.5 mg total) by nebulization every 2 (two) hours as needed for wheezing or shortness of breath.   amantadine 50 MG/5ML solution Commonly known as:  SYMMETREL Take 10 mLs (100 mg total) by mouth 3 (three) times daily. Replaces:  amantadine 100 MG capsule   aspirin 81 MG chewable tablet Chew 1 tablet (81 mg total) by mouth daily. Replaces:  aspirin EC 81 MG tablet   atorvastatin 10 MG tablet Commonly known as:  LIPITOR Take 1 tablet (10 mg total) by mouth at bedtime.   carbidopa-levodopa 25-100 MG tablet Commonly known as:  SINEMET IR Take 1 tablet by mouth 3 (three) times daily. What changed:  when to take this   chlorhexidine gluconate (MEDLINE KIT) 0.12 % solution Commonly known as:  PERIDEX 15 mLs by Mouth Rinse route 2 (two) times daily.   feeding supplement (JEVITY 1.2 CAL) Liqd Place 1,000 mLs into feeding tube continuous.   feeding supplement (PRO-STAT SUGAR FREE 64) Liqd Place 30 mLs into feeding tube 2 (two) times daily.   fentaNYL 25 MCG/HR patch Commonly known as:  DURAGESIC - dosed mcg/hr Place 1 patch (25 mcg total) onto the skin every 3 (three) days.   furosemide 20 MG tablet Commonly known as:  LASIX Place 3 tablets (60 mg total) into feeding tube 2 (two) times daily. What changed:  medication strength  how much to take  how to take this  when to take this   gabapentin 250 MG/5ML solution Commonly known as:  NEURONTIN Place 6 mLs (300 mg total) into feeding tube every 8 (eight) hours. Replaces:  gabapentin 600 MG tablet   Gerhardt's butt cream Crea Apply 1 application topically 4 (four) times daily.   heparin 100-0.45 UNIT/ML-% infusion Inject 2,000 Units/hr into the vein continuous.   insulin aspart 100 UNIT/ML injection Commonly known as:  novoLOG Inject 0-9 Units into the  skin every 4 (four) hours.   LORazepam 1 MG tablet Commonly known as:  ATIVAN Take 1 tablet (1 mg total) by mouth every 12 (twelve) hours.   meropenem 1 g in sodium chloride 0.9 % 100 mL Inject 1 g into the vein every 12 (twelve) hours.   metoprolol tartrate 25 MG tablet Commonly known as:  LOPRESSOR Place 1 tablet (25 mg total) into feeding tube 2 (two) times daily.   midazolam 2 MG/2ML Soln injection Commonly known as:  VERSED Inject 1 mL (1 mg total) into the vein every 2 (two) hours as needed for agitation.   mouth rinse Liqd solution 15 mLs by Mouth Rinse route QID.   pantoprazole sodium 40 mg/20 mL  Pack Commonly known as:  PROTONIX Place 20 mLs (40 mg total) into feeding tube daily.   pneumococcal 23 valent vaccine 25 MCG/0.5ML injection Commonly known as:  PNU-IMMUNE Inject 0.5 mLs into the muscle tomorrow at 10 am.   primidone 50 MG tablet Commonly known as:  MYSOLINE Take 1 tablet (50 mg total) by mouth every 12 (twelve) hours. What changed:  when to take this   RESOURCE THICKENUP CLEAR Powd Take 120 g by mouth as needed.   rOPINIRole 2 MG tablet Commonly known as:  REQUIP Take 1 tablet (2 mg total) by mouth every 12 (twelve) hours. What changed:  medication strength  how much to take  when to take this            Discharge Care Instructions        Start     Ordered   11/20/16 0000  fentaNYL (DURAGESIC - DOSED MCG/HR) 25 MCG/HR patch  every 72 hours     11/18/16 1559   11/19/16 0000  aspirin 81 MG chewable tablet  Daily     11/18/16 1559   11/19/16 0000  pantoprazole sodium (PROTONIX) 40 mg/20 mL PACK  Daily     11/18/16 1559   11/19/16 0000  mouth rinse LIQD solution  4 times daily     11/18/16 1559   11/18/16 0000  furosemide (LASIX) 20 MG tablet  2 times daily     11/18/16 1559   11/18/16 0000  primidone (MYSOLINE) 50 MG tablet  Every 12 hours     11/18/16 1559   11/18/16 0000  atorvastatin (LIPITOR) 10 MG tablet  Daily at bedtime      11/18/16 1559   11/18/16 0000  rOPINIRole (REQUIP) 2 MG tablet  Every 12 hours     11/18/16 1559   11/18/16 0000  amantadine (SYMMETREL) 50 MG/5ML solution  3 times daily     11/18/16 1559   11/18/16 0000  gabapentin (NEURONTIN) 250 MG/5ML solution  Every 8 hours     11/18/16 1559   11/18/16 0000  carbidopa-levodopa (SINEMET IR) 25-100 MG tablet  3 times daily     11/18/16 1559   11/18/16 0000  pneumococcal 23 valent vaccine (PNU-IMMUNE) 25 MCG/0.5ML injection  Tomorrow-1000     11/18/16 1559   11/18/16 0000  insulin aspart (NOVOLOG) 100 UNIT/ML injection  Every 4 hours     11/18/16 1559   11/18/16 0000  albuterol (PROVENTIL) (2.5 MG/3ML) 0.083% nebulizer solution  Every 2 hours PRN     11/18/16 1559   11/18/16 0000  Hydrocortisone (GERHARDT'S BUTT CREAM) CREA  4 times daily     11/18/16 1559   11/18/16 0000  Maltodextrin-Xanthan Gum (RESOURCE THICKENUP CLEAR) POWD  As needed     11/18/16 1559   11/18/16 0000  chlorhexidine gluconate, MEDLINE KIT, (PERIDEX) 0.12 % solution  2 times daily     11/18/16 1559   11/18/16 0000  meropenem 1 g in sodium chloride 0.9 % 100 mL  Every 12 hours     11/18/16 1559   11/18/16 0000  Amino Acids-Protein Hydrolys (FEEDING SUPPLEMENT, PRO-STAT SUGAR FREE 64,) LIQD  2 times daily     11/18/16 1559   11/18/16 0000  Nutritional Supplements (FEEDING SUPPLEMENT, JEVITY 1.2 CAL,) LIQD  Continuous     11/18/16 1559   11/18/16 0000  heparin 100-0.45 UNIT/ML-% infusion  Continuous     11/18/16 1559   11/18/16 0000  metoprolol tartrate (LOPRESSOR) 25 MG tablet  2 times  daily     11/18/16 1559   11/18/16 0000  LORazepam (ATIVAN) 1 MG tablet  Every 12 hours     11/18/16 1559   11/18/16 0000  midazolam (VERSED) 2 MG/2ML SOLN injection  Every 2 hours PRN     11/18/16 1559   11/18/16 0000  Increase activity slowly     11/18/16 1559   11/18/16 0000  Diet - low sodium heart healthy     11/18/16 1559       Discharged Condition: stable  Kennieth Rad,  AGACNP-BC [facility] Pgr: [phone] or if no answer [phone] 11/18/2016, 3:59 PM

## 2016-11-18 NOTE — Care Management Note (Addendum)
Case Management Note  Patient Details  Name: Jack Carter MRN: [id number] Date of Birth: 1933/11/06  Subjective/Objective:  Pt admitted post fall and losing consciousness - transferred from floor in resp distress and now intubated on ICU unit                 Action/Plan:  PTA from home with daughter - pt recently moved from [place].  Pt will benefit from PT eval post extubation.  CM will continue to follow for discharge needs   Expected Discharge Date:                  Expected Discharge Plan:     In-House Referral:     Discharge planning Services  CM Consult  Post Acute Care Choice:    Choice offered to:     DME Arranged:    DME Agency:     HH Arranged:    HH Agency:     Status of Service:     If discussed at [employer], dates discussed:    Additional Comments: 11/18/2016  Discharge will be completed by PCCM per attending.  Pt will discharge to [facility] today - daughter in agreement  Daughter has Research officer, political party.  CM request discharge consideration to facility today - [facility] has available bed.  CM provided facility choice to daughter - daughter is interested in [facility] for discharge.  Both facilities to contact daughter today and provide information and possible tours.    Discussed in LOS 9/18 - pt remains appropriate for continued stay - LTACH referral given during meeting - both agencies accepted referrals and will followup with CM regarding bed offer.  Attending in agreement with LTACH referral  11/13/16 CM was able to speak with daughter regarding possible LTACH referral and SNF dependent upon how pt progresses.  CM spoke with attending and [facility] referral is not appropriate at this time.  Pts daughter continues to request that CM not contact [facility] - "VA is just tertiary and will not cover stay regardless, pt is not service connected, I will contact them again when I need to".    Pt discussed in LOS 9/13 - remains appropriate for continued stay.  Pt received  trach today - and per attending may have ongoing issues with weaning.   CM informed by CSW that daughter remains uninterested in facility placement at discharge.  CM left voicemail for daughter [name].  11/11/16 Discussed in LOS 9/11 - pt remains appropriate for continued stay.  Pt urine out put has decreased and diuretics are being held due to rising creatinine and hypernatremia.  CT today shows potential mulitfocal PNA, D Dimer elevated.  Pts daughter refusing SNF - plans to take pt home with [facility].  Pt is already  active with [facility] for [facility] and PT - daughter informed agency of admit  11/07/16 CM spoke with daughter - Pt remains confused and calling out incoherent however per daughter pt was A/O PTA/  Pt recently moved from [place] in July.  Per daughter VA benefit is tertiary and not primary - daughter is not intrested in having pt transfer to [facility] hospital. Daughter informed [facility] of pts admit.   Per daughter - pt has PCP at [facility] in [place].  Pt does however get his prescription filled through [place] in [place] (PCP at [facility] communicates prescription needs to the [place] directly).    CM will continue to follow for discharge needs Cherylann Parr, RN 11/18/2016, 9:26 AM

## 2016-11-18 NOTE — Progress Notes (Signed)
SLP Cancellation Note  Patient Details Name: Jack Carter MRN: [id number] DOB: 1933-04-19   Cancelled treatment:       Reason Eval/Treat Not Completed: Medical issues which prohibited therapy. Orders received for swallow evaluation but patient is currently on the vent, although he has started some TC trials. Recommend consideration for PMV evaluation prior to swallowing assessment.   Also, RN note 9/13 indicates "Spoke with daughter [name] on the phone, she is requesting that SLP not work with him anymore." Unsure if daughter does not want any SLP services or specific SLPs (he has seen several of [place] since he has been here).   Maxcine Ham 11/18/2016, 8:57 AM  Maxcine Ham, M.A. CCC-SLP [phone]

## 2016-11-18 NOTE — Progress Notes (Signed)
ANTICOAGULATION CONSULT NOTE Pharmacy Consult for Heparin Indication: NSTEMI and PE  No Known Allergies  Patient Measurements: Height:  (175.3 cm) Weight: 180 lb 8.9 oz (81.9 kg) IBW/kg (Calculated) : 70.7  Vital Signs: Temp: 100 F (37.8 C) (09/17 2310) Temp Source: Axillary (09/17 2310) BP: 110/58 (09/18 0100) Pulse Rate: 91 (09/18 0100)  Labs:  Recent Labs  11/15/16 0417  11/16/16 0426 11/17/16 0357 11/17/16 0950 11/18/16 0005  HGB 8.5*  --  8.2* 9.3*  --   --   HCT 28.2*  --  27.2* 30.9*  --   --   PLT 298  --  315 340  --   --   HEPARINUNFRC 0.30  < >  --  0.16* 0.18* 0.36  CREATININE 2.05*  --  1.89* 1.75*  --  1.71*  < > = values in this interval not displayed.  Estimated Creatinine Clearance: 33.9 mL/min (A) (by C-G formula based on SCr of 1.71 mg/dL (H)).  Assessment: 83 y.o. male with NSTEMi and possible PE fpr heparin  Goal of Therapy:  Heparin level 0.3-0.7 units/ml Monitor platelets by anticoagulation protocol: Yes   Plan:  Continue Heparin at current rate   Geannie Risen, PharmD, BCPS

## 2016-11-19 LAB — CBC
HEMATOCRIT: 28.6 % — AB (ref 39.0–52.0)
Hemoglobin: 8.6 g/dL — ABNORMAL LOW (ref 13.0–17.0)
MCH: 29.7 pg (ref 26.0–34.0)
MCHC: 30.1 g/dL (ref 30.0–36.0)
MCV: 98.6 fL (ref 78.0–100.0)
Platelets: 360 10*3/uL (ref 150–400)
RBC: 2.9 MIL/uL — ABNORMAL LOW (ref 4.22–5.81)
RDW: 17.3 % — AB (ref 11.5–15.5)
WBC: 6.1 10*3/uL (ref 4.0–10.5)

## 2016-11-19 LAB — BASIC METABOLIC PANEL
Anion gap: 7 (ref 5–15)
BUN: 44 mg/dL — AB (ref 6–20)
CHLORIDE: 108 mmol/L (ref 101–111)
CO2: 32 mmol/L (ref 22–32)
Calcium: 8 mg/dL — ABNORMAL LOW (ref 8.9–10.3)
Creatinine, Ser: 1.62 mg/dL — ABNORMAL HIGH (ref 0.61–1.24)
GFR calc Af Amer: 44 mL/min — ABNORMAL LOW (ref >60)
GFR, EST NON AFRICAN AMERICAN: 38 mL/min — AB (ref >60)
GLUCOSE: 152 mg/dL — AB (ref 65–99)
POTASSIUM: 4 mmol/L (ref 3.5–5.1)
Sodium: 147 mmol/L — ABNORMAL HIGH (ref 135–145)

## 2016-11-19 LAB — HEPARIN LEVEL (UNFRACTIONATED): Heparin Unfractionated: 0.57 IU/mL (ref 0.30–0.70)

## 2016-11-20 ENCOUNTER — Other Ambulatory Visit (HOSPITAL_COMMUNITY): Payer: Self-pay

## 2016-11-20 LAB — HEPARIN LEVEL (UNFRACTIONATED): HEPARIN UNFRACTIONATED: 1.1 [IU]/mL — AB (ref 0.30–0.70)

## 2016-11-20 LAB — C DIFFICILE QUICK SCREEN W PCR REFLEX
C DIFFICLE (CDIFF) ANTIGEN: NEGATIVE
C DIFFICLE (CDIFF) ANTIGEN: NEGATIVE
C Diff interpretation: NOT DETECTED
C Diff interpretation: NOT DETECTED
C Diff toxin: NEGATIVE
C Diff toxin: NEGATIVE

## 2016-11-20 LAB — APTT: aPTT: >200 seconds (ref 24–36)

## 2016-11-20 MED ORDER — IOPAMIDOL (ISOVUE-370) INJECTION 76%: 80.0000 mL | Freq: Once | INTRAVENOUS | Status: DC | PRN

## 2016-11-20 MED ORDER — IOPAMIDOL (ISOVUE-370) INJECTION 76%
80.0000 mL | Freq: Once | INTRAVENOUS | Status: AC | PRN
  Administered 2016-11-20: 100 mL via INTRAVENOUS

## 2016-11-21 LAB — CBC
HEMATOCRIT: 31.3 % — AB (ref 39.0–52.0)
Hemoglobin: 9.3 g/dL — ABNORMAL LOW (ref 13.0–17.0)
MCH: 29.4 pg (ref 26.0–34.0)
MCHC: 29.7 g/dL — AB (ref 30.0–36.0)
MCV: 99.1 fL (ref 78.0–100.0)
Platelets: 385 10*3/uL (ref 150–400)
RBC: 3.16 MIL/uL — ABNORMAL LOW (ref 4.22–5.81)
RDW: 17.6 % — ABNORMAL HIGH (ref 11.5–15.5)
WBC: 6.2 10*3/uL (ref 4.0–10.5)

## 2016-11-21 LAB — BASIC METABOLIC PANEL
Anion gap: 8 (ref 5–15)
BUN: 36 mg/dL — AB (ref 6–20)
CHLORIDE: 106 mmol/L (ref 101–111)
CO2: 36 mmol/L — AB (ref 22–32)
CREATININE: 1.65 mg/dL — AB (ref 0.61–1.24)
Calcium: 8.2 mg/dL — ABNORMAL LOW (ref 8.9–10.3)
GFR calc Af Amer: 43 mL/min — ABNORMAL LOW (ref >60)
GFR calc non Af Amer: 37 mL/min — ABNORMAL LOW (ref >60)
Glucose, Bld: 131 mg/dL — ABNORMAL HIGH (ref 65–99)
Potassium: 3.7 mmol/L (ref 3.5–5.1)
SODIUM: 150 mmol/L — AB (ref 135–145)

## 2016-11-21 LAB — HEPARIN LEVEL (UNFRACTIONATED)
HEPARIN UNFRACTIONATED: 0.98 [IU]/mL — AB (ref 0.30–0.70)
Heparin Unfractionated: <0.1 IU/mL — ABNORMAL LOW (ref 0.30–0.70)

## 2016-11-23 LAB — BASIC METABOLIC PANEL
ANION GAP: 7 (ref 5–15)
BUN: 44 mg/dL — ABNORMAL HIGH (ref 6–20)
CALCIUM: 8.2 mg/dL — AB (ref 8.9–10.3)
CO2: 36 mmol/L — AB (ref 22–32)
CREATININE: 2.1 mg/dL — AB (ref 0.61–1.24)
Chloride: 107 mmol/L (ref 101–111)
GFR, EST AFRICAN AMERICAN: 32 mL/min — AB (ref >60)
GFR, EST NON AFRICAN AMERICAN: 28 mL/min — AB (ref >60)
Glucose, Bld: 142 mg/dL — ABNORMAL HIGH (ref 65–99)
Potassium: 3.6 mmol/L (ref 3.5–5.1)
Sodium: 150 mmol/L — ABNORMAL HIGH (ref 135–145)

## 2016-11-25 LAB — BASIC METABOLIC PANEL
Anion gap: 7 (ref 5–15)
BUN: 43 mg/dL — AB (ref 6–20)
CHLORIDE: 107 mmol/L (ref 101–111)
CO2: 35 mmol/L — AB (ref 22–32)
CREATININE: 1.84 mg/dL — AB (ref 0.61–1.24)
Calcium: 8.1 mg/dL — ABNORMAL LOW (ref 8.9–10.3)
GFR calc Af Amer: 38 mL/min — ABNORMAL LOW (ref >60)
GFR calc non Af Amer: 33 mL/min — ABNORMAL LOW (ref >60)
Glucose, Bld: 125 mg/dL — ABNORMAL HIGH (ref 65–99)
POTASSIUM: 3.8 mmol/L (ref 3.5–5.1)
SODIUM: 149 mmol/L — AB (ref 135–145)

## 2016-11-26 LAB — BASIC METABOLIC PANEL
ANION GAP: 5 (ref 5–15)
BUN: 46 mg/dL — AB (ref 6–20)
CO2: 37 mmol/L — AB (ref 22–32)
Calcium: 8.2 mg/dL — ABNORMAL LOW (ref 8.9–10.3)
Chloride: 106 mmol/L (ref 101–111)
Creatinine, Ser: 1.89 mg/dL — ABNORMAL HIGH (ref 0.61–1.24)
GFR calc Af Amer: 37 mL/min — ABNORMAL LOW (ref >60)
GFR, EST NON AFRICAN AMERICAN: 32 mL/min — AB (ref >60)
GLUCOSE: 128 mg/dL — AB (ref 65–99)
POTASSIUM: 3.7 mmol/L (ref 3.5–5.1)
Sodium: 148 mmol/L — ABNORMAL HIGH (ref 135–145)

## 2016-11-26 LAB — CBC
HEMATOCRIT: 32.5 % — AB (ref 39.0–52.0)
HEMOGLOBIN: 9.6 g/dL — AB (ref 13.0–17.0)
MCH: 30.2 pg (ref 26.0–34.0)
MCHC: 29.5 g/dL — AB (ref 30.0–36.0)
MCV: 102.2 fL — AB (ref 78.0–100.0)
Platelets: 268 10*3/uL (ref 150–400)
RBC: 3.18 MIL/uL — ABNORMAL LOW (ref 4.22–5.81)
RDW: 19.1 % — AB (ref 11.5–15.5)
WBC: 8.2 10*3/uL (ref 4.0–10.5)

## 2016-11-28 ENCOUNTER — Other Ambulatory Visit (HOSPITAL_COMMUNITY): Payer: Self-pay

## 2016-11-29 ENCOUNTER — Other Ambulatory Visit (HOSPITAL_COMMUNITY): Payer: Self-pay

## 2016-11-29 LAB — BLOOD GAS, ARTERIAL
ACID-BASE EXCESS: 5.6 mmol/L — AB (ref 0.0–2.0)
BICARBONATE: 31.5 mmol/L — AB (ref 20.0–28.0)
FIO2: 60
LHR: 25 {breaths}/min
O2 SAT: 82.4 %
PEEP/CPAP: 5 cmH2O
PH ART: 7.33 — AB (ref 7.350–7.450)
Patient temperature: 96.5
VT: 430 mL
pCO2 arterial: 60.5 mmHg — ABNORMAL HIGH (ref 32.0–48.0)
pO2, Arterial: 51.8 mmHg — ABNORMAL LOW (ref 83.0–108.0)

## 2016-11-29 LAB — URINALYSIS, ROUTINE W REFLEX MICROSCOPIC
BILIRUBIN URINE: NEGATIVE
Glucose, UA: NEGATIVE mg/dL
Ketones, ur: NEGATIVE mg/dL
Leukocytes, UA: NEGATIVE
NITRITE: NEGATIVE
PH: 6 (ref 5.0–8.0)
Protein, ur: 30 mg/dL — AB
SPECIFIC GRAVITY, URINE: 1.017 (ref 1.005–1.030)
Squamous Epithelial / LPF: NONE SEEN

## 2016-11-29 LAB — CBC
HEMATOCRIT: 30.9 % — AB (ref 39.0–52.0)
HEMOGLOBIN: 9.3 g/dL — AB (ref 13.0–17.0)
MCH: 30.4 pg (ref 26.0–34.0)
MCHC: 30.1 g/dL (ref 30.0–36.0)
MCV: 101 fL — AB (ref 78.0–100.0)
Platelets: 217 10*3/uL (ref 150–400)
RBC: 3.06 MIL/uL — ABNORMAL LOW (ref 4.22–5.81)
RDW: 20.1 % — ABNORMAL HIGH (ref 11.5–15.5)
WBC: 15.8 10*3/uL — ABNORMAL HIGH (ref 4.0–10.5)

## 2016-11-29 LAB — BASIC METABOLIC PANEL
Anion gap: 9 (ref 5–15)
BUN: 64 mg/dL — AB (ref 6–20)
CO2: 31 mmol/L (ref 22–32)
CREATININE: 2.5 mg/dL — AB (ref 0.61–1.24)
Calcium: 8.1 mg/dL — ABNORMAL LOW (ref 8.9–10.3)
Chloride: 107 mmol/L (ref 101–111)
GFR calc Af Amer: 26 mL/min — ABNORMAL LOW (ref >60)
GFR calc non Af Amer: 23 mL/min — ABNORMAL LOW (ref >60)
GLUCOSE: 102 mg/dL — AB (ref 65–99)
Potassium: 3.6 mmol/L (ref 3.5–5.1)
Sodium: 147 mmol/L — ABNORMAL HIGH (ref 135–145)

## 2016-11-29 LAB — LACTIC ACID, PLASMA
LACTIC ACID, VENOUS: 1.6 mmol/L (ref 0.5–1.9)
Lactic Acid, Venous: 4.3 mmol/L (ref 0.5–1.9)
Lactic Acid, Venous: 2.9 mmol/L (ref 0.5–1.9)

## 2016-11-30 LAB — BLOOD GAS, ARTERIAL
Acid-Base Excess: 6.8 mmol/L — ABNORMAL HIGH (ref 0.0–2.0)
Acid-Base Excess: 6.1 mmol/L — ABNORMAL HIGH (ref 0.0–2.0)
BICARBONATE: 32 mmol/L — AB (ref 20.0–28.0)
BICARBONATE: 32.5 mmol/L — AB (ref 20.0–28.0)
FIO2: 90
FIO2: 0.5
LHR: 26 {breaths}/min
MECHVT: 430 mL
O2 SAT: 99.6 %
O2 Saturation: 91.3 %
PATIENT TEMPERATURE: 98.6
PATIENT TEMPERATURE: 98.6
PCO2 ART: 56.2 mmHg — AB (ref 32.0–48.0)
PEEP: 5 cmH2O
PEEP: 5 cmH2O
PH ART: 7.373 (ref 7.350–7.450)
RATE: 28 resp/min
VT: 430 mL
pCO2 arterial: 70.9 mmHg (ref 32.0–48.0)
pH, Arterial: 7.283 — ABNORMAL LOW (ref 7.350–7.450)
pO2, Arterial: 316 mmHg — ABNORMAL HIGH (ref 83.0–108.0)
pO2, Arterial: 69.5 mmHg — ABNORMAL LOW (ref 83.0–108.0)

## 2016-11-30 LAB — LACTIC ACID, PLASMA: LACTIC ACID, VENOUS: 2.2 mmol/L — AB (ref 0.5–1.9)

## 2016-12-02 ENCOUNTER — Other Ambulatory Visit (HOSPITAL_COMMUNITY): Payer: Self-pay

## 2016-12-02 LAB — BLOOD GAS, ARTERIAL
Acid-Base Excess: 11.8 mmol/L — ABNORMAL HIGH (ref 0.0–2.0)
Bicarbonate: 37.8 mmol/L — ABNORMAL HIGH (ref 20.0–28.0)
FIO2: 0.5
O2 SAT: 98.6 %
PATIENT TEMPERATURE: 98.6
PCO2 ART: 70.2 mmHg — AB (ref 32.0–48.0)
PEEP: 5 cmH2O
PH ART: 7.35 (ref 7.350–7.450)
PO2 ART: 129 mmHg — AB (ref 83.0–108.0)
RATE: 25 resp/min
VT: 430 mL

## 2016-12-02 LAB — VANCOMYCIN, TROUGH: Vancomycin Tr: 11 ug/mL — ABNORMAL LOW (ref 15–20)

## 2016-12-02 LAB — URINE CULTURE

## 2016-12-02 NOTE — Progress Notes (Signed)
Patient ID: Jack Carter, male   DOB: [birth year], 83 y.o.   MRN: [id number]   Discussed with Dtr Paige percutaneous gastric tube order  She was unaware of request Wants to HOLD for now until she discusses with MD  Please re order when/if intend to move forward

## 2016-12-03 ENCOUNTER — Ambulatory Visit: Payer: Medicare Other | Admitting: Neurology

## 2016-12-04 LAB — BASIC METABOLIC PANEL
ANION GAP: 9 (ref 5–15)
BUN: 57 mg/dL — AB (ref 6–20)
CHLORIDE: 111 mmol/L (ref 101–111)
CO2: 37 mmol/L — ABNORMAL HIGH (ref 22–32)
Calcium: 8.4 mg/dL — ABNORMAL LOW (ref 8.9–10.3)
Creatinine, Ser: 2 mg/dL — ABNORMAL HIGH (ref 0.61–1.24)
GFR, EST AFRICAN AMERICAN: 34 mL/min — AB (ref >60)
GFR, EST NON AFRICAN AMERICAN: 30 mL/min — AB (ref >60)
Glucose, Bld: 133 mg/dL — ABNORMAL HIGH (ref 65–99)
POTASSIUM: 4.2 mmol/L (ref 3.5–5.1)
SODIUM: 157 mmol/L — AB (ref 135–145)

## 2016-12-04 LAB — CULTURE, BLOOD (ROUTINE X 2)
CULTURE: NO GROWTH
Culture: NO GROWTH
SPECIAL REQUESTS: ADEQUATE

## 2016-12-04 LAB — CBC
HEMATOCRIT: 31.3 % — AB (ref 39.0–52.0)
HEMOGLOBIN: 9.3 g/dL — AB (ref 13.0–17.0)
MCH: 30.6 pg (ref 26.0–34.0)
MCHC: 29.7 g/dL — ABNORMAL LOW (ref 30.0–36.0)
MCV: 103 fL — ABNORMAL HIGH (ref 78.0–100.0)
Platelets: 165 10*3/uL (ref 150–400)
RBC: 3.04 MIL/uL — AB (ref 4.22–5.81)
RDW: 19.6 % — ABNORMAL HIGH (ref 11.5–15.5)
WBC: 12.7 10*3/uL — AB (ref 4.0–10.5)

## 2016-12-04 LAB — AMMONIA: AMMONIA: 112 umol/L — AB (ref 9–35)

## 2016-12-05 LAB — BASIC METABOLIC PANEL
ANION GAP: 3 — AB (ref 5–15)
BUN: 47 mg/dL — ABNORMAL HIGH (ref 6–20)
CHLORIDE: 108 mmol/L (ref 101–111)
CO2: 39 mmol/L — AB (ref 22–32)
Calcium: 8 mg/dL — ABNORMAL LOW (ref 8.9–10.3)
Creatinine, Ser: 1.97 mg/dL — ABNORMAL HIGH (ref 0.61–1.24)
GFR calc non Af Amer: 30 mL/min — ABNORMAL LOW (ref >60)
GFR, EST AFRICAN AMERICAN: 35 mL/min — AB (ref >60)
Glucose, Bld: 189 mg/dL — ABNORMAL HIGH (ref 65–99)
Potassium: 3.5 mmol/L (ref 3.5–5.1)
Sodium: 150 mmol/L — ABNORMAL HIGH (ref 135–145)

## 2016-12-05 LAB — CBC
HEMATOCRIT: 33.9 % — AB (ref 39.0–52.0)
HEMOGLOBIN: 9.5 g/dL — AB (ref 13.0–17.0)
MCH: 29.3 pg (ref 26.0–34.0)
MCHC: 28 g/dL — AB (ref 30.0–36.0)
MCV: 104.6 fL — ABNORMAL HIGH (ref 78.0–100.0)
Platelets: 163 10*3/uL (ref 150–400)
RBC: 3.24 MIL/uL — ABNORMAL LOW (ref 4.22–5.81)
RDW: 19.4 % — ABNORMAL HIGH (ref 11.5–15.5)
WBC: 11.4 10*3/uL — ABNORMAL HIGH (ref 4.0–10.5)

## 2016-12-05 LAB — VANCOMYCIN, TROUGH: VANCOMYCIN TR: 9 ug/mL — AB (ref 15–20)

## 2016-12-06 LAB — BASIC METABOLIC PANEL
ANION GAP: 8 (ref 5–15)
BUN: 47 mg/dL — ABNORMAL HIGH (ref 6–20)
CO2: 40 mmol/L — AB (ref 22–32)
Calcium: 8.1 mg/dL — ABNORMAL LOW (ref 8.9–10.3)
Chloride: 105 mmol/L (ref 101–111)
Creatinine, Ser: 1.81 mg/dL — ABNORMAL HIGH (ref 0.61–1.24)
GFR calc Af Amer: 39 mL/min — ABNORMAL LOW (ref >60)
GFR calc non Af Amer: 33 mL/min — ABNORMAL LOW (ref >60)
GLUCOSE: 125 mg/dL — AB (ref 65–99)
Potassium: 3.6 mmol/L (ref 3.5–5.1)
Sodium: 153 mmol/L — ABNORMAL HIGH (ref 135–145)

## 2016-12-06 LAB — PROTIME-INR
INR: 1.19
Prothrombin Time: 15 seconds (ref 11.4–15.2)

## 2016-12-08 LAB — BASIC METABOLIC PANEL
Anion gap: 9 (ref 5–15)
BUN: 38 mg/dL — ABNORMAL HIGH (ref 6–20)
CALCIUM: 8.2 mg/dL — AB (ref 8.9–10.3)
CO2: 36 mmol/L — AB (ref 22–32)
CREATININE: 1.78 mg/dL — AB (ref 0.61–1.24)
Chloride: 102 mmol/L (ref 101–111)
GFR, EST AFRICAN AMERICAN: 39 mL/min — AB (ref >60)
GFR, EST NON AFRICAN AMERICAN: 34 mL/min — AB (ref >60)
Glucose, Bld: 97 mg/dL (ref 65–99)
Potassium: 3.8 mmol/L (ref 3.5–5.1)
SODIUM: 147 mmol/L — AB (ref 135–145)

## 2016-12-08 LAB — CBC
HEMATOCRIT: 27 % — AB (ref 39.0–52.0)
Hemoglobin: 7.9 g/dL — ABNORMAL LOW (ref 13.0–17.0)
MCH: 29.4 pg (ref 26.0–34.0)
MCHC: 29.3 g/dL — AB (ref 30.0–36.0)
MCV: 100.4 fL — ABNORMAL HIGH (ref 78.0–100.0)
PLATELETS: 171 10*3/uL (ref 150–400)
RBC: 2.69 MIL/uL — ABNORMAL LOW (ref 4.22–5.81)
RDW: 19.2 % — AB (ref 11.5–15.5)
WBC: 8.6 10*3/uL (ref 4.0–10.5)

## 2016-12-08 NOTE — Consult Note (Signed)
Chief Complaint: Patient was seen in consultation today for percutaneous gastric vs gastric-jejunal tube placement at the request of Dr Laren Everts  Supervising Physician: Corrie Mckusick  Patient Status: Select IP  History of Present Illness: Jack Carter is a 83 y.o. male   CAD/CABG; NSTEMI Acute respiratory failure Intubated; extubated; - now trach Parkinson Disease Dysphagia Need for long term care  Request for percutaneous gastric vs gastric-jejunal tube placement per Dr Laren Everts Imaging approved with Dr Laurence Ferrari  Past Medical History:  Diagnosis Date  . Coronary artery disease    CABG  . Essential hypertension   . Hyperlipidemia   . Parkinson's disease ([place])   . Restless leg syndrome     Past Surgical History:  Procedure Laterality Date  . CORONARY ARTERY BYPASS GRAFT      Allergies: Patient has no known allergies.  Medications: Prior to Admission medications   Medication Sig Start Date End Date Taking? Authorizing Provider  albuterol (PROVENTIL) (2.5 MG/3ML) 0.083% nebulizer solution Take 3 mLs (2.5 mg total) by nebulization every 2 (two) hours as needed for wheezing or shortness of breath. 11/18/16   Desai, Rahul P, PA-C  amantadine (SYMMETREL) 50 MG/5ML solution Take 10 mLs (100 mg total) by mouth 3 (three) times daily. 11/18/16   Desai, Rahul P, PA-C  Amino Acids-Protein Hydrolys (FEEDING SUPPLEMENT, PRO-STAT SUGAR FREE 64,) LIQD Place 30 mLs into feeding tube 2 (two) times daily. 11/18/16   Desai, Rahul P, PA-C  aspirin 81 MG chewable tablet Chew 1 tablet (81 mg total) by mouth daily. 11/19/16   Desai, Rahul P, PA-C  atorvastatin (LIPITOR) 10 MG tablet Take 1 tablet (10 mg total) by mouth at bedtime. 11/18/16   Desai, Rahul P, PA-C  carbidopa-levodopa (SINEMET IR) 25-100 MG tablet Take 1 tablet by mouth 3 (three) times daily. 11/18/16   Desai, Rahul P, PA-C  chlorhexidine gluconate, MEDLINE KIT, (PERIDEX) 0.12 % solution 15 mLs by Mouth Rinse route 2 (two)  times daily. 11/18/16   Desai, Rahul P, PA-C  fentaNYL (DURAGESIC - DOSED MCG/HR) 25 MCG/HR patch Place 1 patch (25 mcg total) onto the skin every 3 (three) days. 11/20/16   Desai, Rahul P, PA-C  furosemide (LASIX) 20 MG tablet Place 3 tablets (60 mg total) into feeding tube 2 (two) times daily. 11/18/16   Desai, Rahul P, PA-C  gabapentin (NEURONTIN) 250 MG/5ML solution Place 6 mLs (300 mg total) into feeding tube every 8 (eight) hours. 11/18/16   Desai, Rahul P, PA-C  heparin 100-0.45 UNIT/ML-% infusion Inject 2,000 Units/hr into the vein continuous. 11/18/16   Desai, Rahul P, PA-C  Hydrocortisone (GERHARDT'S BUTT CREAM) CREA Apply 1 application topically 4 (four) times daily. 11/18/16   Desai, Rahul P, PA-C  insulin aspart (NOVOLOG) 100 UNIT/ML injection Inject 0-9 Units into the skin every 4 (four) hours. 11/18/16   Desai, Rahul P, PA-C  LORazepam (ATIVAN) 1 MG tablet Take 1 tablet (1 mg total) by mouth every 12 (twelve) hours. 11/18/16   Desai, Rahul P, PA-C  Maltodextrin-Xanthan Gum (RESOURCE THICKENUP CLEAR) POWD Take 120 g by mouth as needed. 11/18/16   Desai, Rahul P, PA-C  meropenem 1 g in sodium chloride 0.9 % 100 mL Inject 1 g into the vein every 12 (twelve) hours. 11/18/16   Desai, Rahul P, PA-C  metoprolol tartrate (LOPRESSOR) 25 MG tablet Place 1 tablet (25 mg total) into feeding tube 2 (two) times daily. 11/18/16   Desai, Rahul P, PA-C  midazolam (VERSED) 2 MG/2ML SOLN injection Inject  1 mL (1 mg total) into the vein every 2 (two) hours as needed for agitation. 11/18/16   Shearon Stalls, Rahul P, PA-C  mouth rinse LIQD solution 15 mLs by Mouth Rinse route QID. 11/19/16   Desai, Rahul P, PA-C  Nutritional Supplements (FEEDING SUPPLEMENT, JEVITY 1.2 CAL,) LIQD Place 1,000 mLs into feeding tube continuous. 11/18/16   Desai, Rahul P, PA-C  pantoprazole sodium (PROTONIX) 40 mg/20 mL PACK Place 20 mLs (40 mg total) into feeding tube daily. 11/19/16   Desai, Rahul P, PA-C  primidone (MYSOLINE) 50 MG tablet Take 1  tablet (50 mg total) by mouth every 12 (twelve) hours. 11/18/16   Desai, Rahul P, PA-C  rOPINIRole (REQUIP) 2 MG tablet Take 1 tablet (2 mg total) by mouth every 12 (twelve) hours. 11/18/16   Desai, Rahul P, PA-C     No family history on file.  Social History   Social History  . Marital status: Married    Spouse name: N/A  . Number of children: N/A  . Years of education: N/A   Social History Main Topics  . Smoking status: Former Research scientist (life sciences)  . Smokeless tobacco: Never Used  . Alcohol use Not on file  . Drug use: Unknown  . Sexual activity: Not on file   Other Topics Concern  . Not on file   Social History Narrative  . No narrative on file    Review of Systems: A 12 point ROS discussed and pertinent positives are indicated in the HPI above.  All other systems are negative.  Review of Systems  Respiratory:       Trach/vent  Neurological: Positive for weakness.    Vital Signs: There were no vitals taken for this visit.  Physical Exam  Pulmonary/Chest:  Trach/vent  Abdominal: Soft. Bowel sounds are normal.  Skin: Skin is warm and dry.  Psychiatric:  Consented with Dtr [name] via phone  Nursing note and vitals reviewed.   Imaging: Dg Chest 1 View  Result Date: 11/11/2016 CLINICAL DATA:  Respiratory failure.  Cardiogenic and septic shock. EXAM: CHEST 1 VIEW COMPARISON:  11/10/2016 chest CT FINDINGS: The patient is status post CABG. Heart size is top normal with aortic atherosclerosis at the arch. Diffuse bilateral patchy airspace opacities are again visualized the differential considerations again suspicious for multifocal pneumonia, pulmonary edema or possibly ARDS among some possibilities. Feeding tube is seen extending below the left hemidiaphragm with surgical clips at the [place] juncture. No acute nor suspicious osseous abnormalities. IMPRESSION: 1. Bilateral multifocal airspace opacities with differential possibilities that may include multifocal pneumonia, ARDS, or stigmata  of pulmonary edema among some possibilities. 2. Status post CABG with aortic atherosclerosis. 3. Feeding tube extends below the left hemidiaphragm. Electronically Signed   By: Ashley Royalty M.D.   On: 11/11/2016 17:26   Ct Head Wo Contrast  Result Date: 11/28/2016 CLINICAL DATA:  Altered mental status. History of Parkinson's disease, hypertension hyperlipidemia. EXAM: CT HEAD WITHOUT CONTRAST TECHNIQUE: Contiguous axial images were obtained from the base of the skull through the vertex without intravenous contrast. COMPARISON:  CT HEAD October 31, 2016 FINDINGS: BRAIN: No intraparenchymal hemorrhage, mass effect nor midline shift. The ventricles and sulci are normal for age. Patchy supratentorial white matter hypodensities less than expected for patient's age, though non-specific are most compatible with chronic small vessel ischemic disease. No acute large vascular territory infarcts. No abnormal extra-axial fluid collections. Basal cisterns are patent. VASCULAR: Moderate calcific atherosclerosis of the carotid siphons. SKULL: No skull fracture. No significant scalp  soft tissue swelling. SINUSES/ORBITS: Dense inspissated mucus RIGHT maxillary sinus.The included ocular globes and orbital contents are non-suspicious. RIGHT nasogastric tube. OTHER: None. IMPRESSION: Stable negative noncontrast CT HEAD for age. Electronically Signed   By: Elon Alas M.D.   On: 11/28/2016 22:37   Ct Chest Wo Contrast  Result Date: 11/10/2016 CLINICAL DATA:  Acute hypoxic respiratory failure in patient and mid with cardiogenic and septic shock. EXAM: CT CHEST WITHOUT CONTRAST TECHNIQUE: Multidetector CT imaging of the chest was performed following the standard protocol without IV contrast. COMPARISON:  Single-view of the chest 11/10/2016, 11/09/2016, 0 9/ since 08/2016 and 10/21/2016. FINDINGS: Cardiovascular: The patient is status post CABG. Calcific aortic and coronary atherosclerosis is present. There is mild cardiomegaly.  No pericardial effusion. Mediastinum/Nodes: NG tube is in place. Small hiatal hernia is seen. No lymphadenopathy. Lungs/Pleura: Small left and small to moderate right pleural effusions are present. The patient has extensive bilateral upper lobe airspace disease. Milder degree of airspace disease is seen in the lower lobes bilaterally. Scattered ground-glass attenuation is noted. No pneumothorax. Upper Abdomen: No acute abnormality. Musculoskeletal: Multilevel thoracic spondylosis is seen. No lytic or sclerotic lesion. No acute abnormality. IMPRESSION: Extensive bilateral airspace disease with an upper lobe predominance could be due to multifocal pneumonia and/or edema. Distribution of airspace disease is atypical for aspiration. Small left and small to moderate right pleural effusion. Hiatal hernia. Aortic Atherosclerosis (ICD10-I70.0). Electronically Signed   By: Inge Rise M.D.   On: 11/10/2016 23:29   Ct Angio Chest Pe W Or Wo Contrast  Result Date: 11/20/2016 CLINICAL DATA:  Pulmonary embolism suspected. EXAM: CT ANGIOGRAPHY CHEST WITH CONTRAST TECHNIQUE: Multidetector CT imaging of the chest was performed using the standard protocol during bolus administration of intravenous contrast. Multiplanar CT image reconstructions and MIPs were obtained to evaluate the vascular anatomy. CONTRAST:  67 cc Isovue 370 COMPARISON:  Chest x-ray dated 11/18/2016. Chest CT dated 11/10/2016. FINDINGS: Cardiovascular: Some of the most peripheral segmental and subsegmental pulmonary arteries are difficult to definitively characterize due to mild patient breathing motion artifact and bibasilar atelectasis, however, there is no pulmonary embolism identified within the main, lobar or central segmental pulmonary arteries bilaterally. Heart size is upper normal. No pericardial effusion. No aortic aneurysm or evidence of aortic dissection. Aortic atherosclerosis. Coronary artery calcifications. Mediastinum/Nodes: Scattered  small lymph nodes. No enlarged lymph nodes or masses identified within the mediastinum or perihilar regions. Surgical changes at the gastroesophageal junction, presumably related to hiatal hernia repair. Enteric tube is well positioned in the stomach. Tracheostomy tube appears appropriately positioned. Trachea and central bronchi otherwise unremarkable. Lungs/Pleura: Diffuse ground-glass opacities within the mid and upper lung zones bilaterally, slightly decreased in extent compared to the chest CT of 11/10/2016. New denser consolidation within the right lower lobe, pneumonia versus aspiration. Additional patchy consolidations within the left lower lobe, also compatible with pneumonia or aspiration. Moderate pleural effusions bilaterally with adjacent compressive atelectasis. No pneumothorax. Upper Abdomen: No acute findings in the upper abdomen. Enteric tube appears appropriately position with tip at the level of the pylorus/duodenal bulb. Musculoskeletal: Degenerative changes throughout the thoracolumbar spine, at least moderate in degree. Slightly displaced fracture of the right anterior sixth rib, acute to subacute appearing. Additional subacute to the chronic fractures of the right anterior-lateral eighth through tenth ribs. Review of the MIP images confirms the above findings. IMPRESSION: 1. No pulmonary embolism, with mild study limitations detailed above. 2. Diffuse ground-glass opacities within the mid and upper lung zones bilaterally, most likely related to  pulmonary edema, slightly decreased in extent compared to the chest CT of 11/10/2016. 3. New dense consolidations within the right lower lobe and left lower lobe, pneumonia versus aspiration. 4. Moderate bilateral pleural effusions, slightly increased in size compared to the earlier exam, with associated bibasilar compressive atelectasis. 5. Slightly displaced fracture of the right anterior sixth rib, acute to subacute. Additional subacute to chronic  fractures of the right eighth through tenth ribs. 6. Aortic atherosclerosis. 7. Support apparatus appears appropriately positioned, as detailed above. Electronically Signed   By: Franki Cabot M.D.   On: 11/20/2016 19:18   Nm Pulmonary Perf And Vent  Result Date: 11/11/2016 CLINICAL DATA:  Shortness of breath, elevated D-dimer, question pulmonary embolism EXAM: NUCLEAR MEDICINE VENTILATION - PERFUSION LUNG SCAN TECHNIQUE: Ventilation images were obtained in multiple projections using inhaled aerosol [id number]. Perfusion images were obtained in multiple projections after intravenous injection of [id number]. Lateral views could not be obtained due to patient condition. RADIOPHARMACEUTICALS:  31.7 mCi Technetium-9975mPA aerosol inhalation and 4.0 mCi Technetium-43m96m IV COMPARISON:  None Radiographic correlation:  Chest radiograph 11/11/2016 FINDINGS: Ventilation: Central airway deposition of aerosol. Diminished ventilation RIGHT upper lobe. Less severely diminished aeration in LEFT upper lobe. Minimally diminished ventilation at lateral LEFT lower lobe base. Perfusion: Matching diminished perfusion in RIGHT upper lobe. Subsegmental diminished perfusion at lateral LEFT lower lobe base. Additional decreased perfusion in the LEFT upper lobe though question minimally less severe than the ventilation abnormality. Chest radiograph: Severe diffuse BILATERAL airspace infiltrates greatest in RIGHT upper lobe. IMPRESSION: Matching areas of impaired ventilation, perfusion and radiographic abnormalities in the upper lobes bilaterally greater on RIGHT. Findings represent an intermediate probability for pulmonary embolism. Electronically Signed   By: MarkLavonia Dana.   On: 11/11/2016 17:37   Dg Chest Port 1 View  Result Date: 11/29/2016 CLINICAL DATA:  Hypoxic respiratory failure. EXAM: PORTABLE CHEST 1 VIEW COMPARISON:  11/18/2016 chest radiograph and 11/20/2016 CT FINDINGS: Cardiomegaly and cardiac surgical changes  again noted. Tracheostomy tube again noted with NG tube entering the stomach. Diffuse bilateral airspace opacities again noted and slightly increased in the lower lungs. Small right pleural effusion and probable small left pleural effusion appears slightly increased. No pneumothorax. IMPRESSION: Diffuse bilateral airspace opacities, slightly increasing in the lower lungs. Small bilateral pleural effusions, likely slightly increased. NG tube entering the stomach. Electronically Signed   By: JeffMargarette Canada.   On: 11/29/2016 10:26   Dg Chest Port 1 View  Result Date: 11/19/2016 CLINICAL DATA:  Shortness of breath EXAM: PORTABLE CHEST 1 VIEW COMPARISON:  11/16/2016 FINDINGS: Tracheostomy with tip measuring 5.6 cm above the carina. Enteric tube tip is off the field of view but below the left hemidiaphragm. Postoperative changes in the mediastinum. Heart size is normal. Diffuse bilateral airspace and interstitial infiltration is probably unchanged since previous study, lying for differences in technique. Small bilateral pleural effusions. No pneumothorax. Calcification of the aorta. Surgical clips at the EG junction. IMPRESSION: Diffuse bilateral pulmonary infiltrates without significant change. Small bilateral pleural effusions are suggested. Electronically Signed   By: WillLucienne Capers.   On: 11/19/2016 00:07   Dg Chest Port 1 View  Result Date: 11/16/2016 CLINICAL DATA:  Acute respiratory failure. EXAM: PORTABLE CHEST 1 VIEW COMPARISON:  11/15/2016 FINDINGS: PET extends beyond the inferior aspect of the film. Right-sided subclavian line terminates at the low SVC. Prior median sternotomy. Tracheostomy appropriately position. Normal heart size. Atherosclerosis in the transverse aorta. No definite pleural fluid. No pneumothorax.  Diffuse airspace disease is not significantly changed. IMPRESSION: No significant change in diffuse airspace disease. Considerations remain ARDS, infection, or aspiration. Aortic  Atherosclerosis (ICD10-I70.0). Electronically Signed   By: Abigail Miyamoto M.D.   On: 11/16/2016 10:14   Dg Chest Port 1 View  Result Date: 11/15/2016 CLINICAL DATA:  Pneumonia. EXAM: PORTABLE CHEST 1 VIEW COMPARISON:  Chest x-ray from yesterday. FINDINGS: Unchanged right central line with the tip in the mid SVC. Unchanged positioning of the tracheostomy tube. A feeding tube enters the stomach, with the tip beyond the field of view. Prior CABG. The cardiomediastinal silhouette remains enlarged. Severe diffuse bilateral pulmonary opacities appear slightly worsened when compared to prior study. Small left pleural effusion. No pneumothorax. IMPRESSION: Worsening severe diffuse pulmonary opacities. Electronically Signed   By: Titus Dubin M.D.   On: 11/15/2016 07:39   Dg Chest Port 1 View  Result Date: 11/14/2016 CLINICAL DATA:  Post central line placement EXAM: PORTABLE CHEST 1 VIEW COMPARISON:  11/14/2016 FINDINGS: Right Central line placement with the tip in the SVC. No pneumothorax. Tracheostomy tube is unchanged. Prior CABG. Mild cardiomegaly. Severe diffuse bilateral airspace disease, not significantly changed. IMPRESSION: Right central line tip in the SVC.  No pneumothorax. Stable severe diffuse bilateral airspace disease. Electronically Signed   By: Rolm Baptise M.D.   On: 11/14/2016 12:08   Dg Chest Port 1 View  Result Date: 11/14/2016 CLINICAL DATA:  Pneumonia. EXAM: PORTABLE CHEST 1 VIEW COMPARISON:  11/13/2016. FINDINGS: Tracheostomy tube and feeding tube in stable position. Prior CABG. Heart size stable. Diffuse bilateral airspace disease again noted without interim change. Small left pleural effusion cannot be excluded. IMPRESSION: 1. Tracheostomy tube and feeding tube in stable position. 2. Diffuse bilateral airspace disease without significant interim change. Small left pleural effusion cannot be excluded . Electronically Signed   By: Marcello Moores  Register   On: 11/14/2016 06:26   Dg Chest Port  1 View  Result Date: 11/13/2016 CLINICAL DATA:  Acute respiratory failure, coronary artery disease, Parkinson's disease EXAM: PORTABLE CHEST 1 VIEW COMPARISON:  Portable chest x-ray of 11/13/2012 and CT chest of 11/10/2012 FINDINGS: There has been worsening of patchy airspace disease bilaterally most consistent with multifocal pneumonia. There may be small effusions present. Cardiomegaly is stable. Tracheostomy is now present. Median sternotomy sutures are noted and a feeding tube is present. IMPRESSION: Worsening of patchy airspace disease bilaterally most consistent with multifocal pneumonia. Electronically Signed   By: Ivar Drape M.D.   On: 11/13/2016 14:22   Portable Chest Xray  Result Date: 11/13/2016 CLINICAL DATA:  Intubation. EXAM: PORTABLE CHEST 1 VIEW COMPARISON:  11/12/2016. FINDINGS: Endotracheal tube and feeding tube in stable position. Prior CABG. Cardiomegaly. Persistent diffuse bilateral airspace disease. Small bilateral pleural effusions. No pneumothorax. Surgical clips and sutures noted in the upper abdomen. IMPRESSION: 1. Lines and tubes in stable position. 2. Persistent bilateral diffuse airspace disease again noted. Bibasilar atelectasis. Small bilateral pleural effusions. 3. Prior CABG.  Stable cardiomegaly. Electronically Signed   By: Marcello Moores  Register   On: 11/13/2016 06:37   Portable Chest Xray  Result Date: 11/12/2016 CLINICAL DATA:  Intubation EXAM: PORTABLE CHEST 1 VIEW COMPARISON:  11/12/2016 FINDINGS: Diffuse bilateral airspace disease, worsening since prior study, likely worsening edema. Endotracheal tube tip approximately 2 cm above the carina. Cardiomegaly. Feeding tube remains in place. IMPRESSION: Endotracheal tube 2 cm above the carina. Severe diffuse bilateral airspace disease, worsening since prior study, likely edema/ CHF. Electronically Signed   By: Rolm Baptise M.D.  On: 11/12/2016 19:04   Dg Chest Port 1 View  Result Date: 11/12/2016 CLINICAL DATA:  ARDS.  EXAM: PORTABLE CHEST 1 VIEW COMPARISON:  01/20/2017 FINDINGS: The heart size and mediastinal contours are stable. Patchy consolidation throughout bilateral lungs are identified minimally improved particularly in the right upper lobe compared prior exam. There is left pleural effusion. A feeding tube is identified with distal tip not included on film. IMPRESSION: Patchy consolidation throughout bilateral lungs, minimally improved particularly in the right upper lobe compared to prior exam. Electronically Signed   By: Abelardo Diesel M.D.   On: 11/12/2016 17:24   Dg Chest Port 1 View  Result Date: 11/10/2016 CLINICAL DATA:  Hypoxia EXAM: PORTABLE CHEST 1 VIEW COMPARISON:  11/09/2016 FINDINGS: Multifocal patchy opacities, right upper lobe and left lower lobe predominant. While mild to moderate interstitial edema is possible, superimposed pneumonia is suspected. Possible small left pleural effusion.  No pneumothorax. Cardiomegaly. Postsurgical changes related to prior CABG. Median sternotomy. Enteric tube courses below the diaphragm. IMPRESSION: Multifocal patchy opacities, favoring pneumonia superimposed on interstitial edema, stable versus mildly increased. Possible small left pleural effusion. Electronically Signed   By: Julian Hy M.D.   On: 11/10/2016 07:11   Dg Chest Port 1v Same Day  Result Date: 11/09/2016 CLINICAL DATA:  Dyspnea EXAM: PORTABLE CHEST 1 VIEW COMPARISON:  11/07/2016 FINDINGS: Progression of right upper lobe airspace disease. Mild improvement in right lower lobe airspace disease. Mild improvement in left lower lobe airspace disease and left effusion. Vascular congestion and possible edema. IMPRESSION: Progression of right upper lobe infiltrate and improvement in right lower lobe infiltrate. Improvement in left lower lobe airspace disease. Possible edema versus pneumonia Electronically Signed   By: Franchot Gallo M.D.   On: 11/09/2016 14:18   Dg Abd Portable 1v  Result Date:  12/02/2016 CLINICAL DATA:  NG tube placement. EXAM: PORTABLE ABDOMEN - 1 VIEW COMPARISON:  Abdominal x-ray dated November 29, 2016. FINDINGS: Enteric tube with the tip and distal side port in the stomach. The bowel gas pattern is normal. No radio-opaque calculi or other significant radiographic abnormality are seen. Prior cholecystectomy. IMPRESSION: NG tube with tip and distal side port in the gastric body. Electronically Signed   By: Titus Dubin M.D.   On: 12/02/2016 13:20   Dg Abd Portable 1v  Result Date: 11/29/2016 CLINICAL DATA:  NG tube placement. EXAM: PORTABLE ABDOMEN - 1 VIEW COMPARISON:  11/18/2016 FINDINGS: An NG tube is identified with tip overlying the distal stomach. The bowel gas pattern is unremarkable. Cholecystectomy clips are again identified. IMPRESSION: NG tube with tip overlying the distal stomach. Electronically Signed   By: Margarette Canada M.D.   On: 11/29/2016 08:57   Dg Abd Portable 1v  Result Date: 11/19/2016 CLINICAL DATA:  NG tube placement EXAM: PORTABLE ABDOMEN - 1 VIEW COMPARISON:  11/08/2016 FINDINGS: Feeding tube tip is in the mid abdomen consistent with location in the third portion of the duodenum. Visualized bowel gas pattern is normal. Surgical clips in the right upper quadrant. Patchy infiltration of the lung bases. Small left pleural effusion. IMPRESSION: Feeding tube tip is in the mid abdomen consistent with location in the third portion of the duodenum. Electronically Signed   By: Lucienne Capers M.D.   On: 11/19/2016 00:06   Dg Abd Portable 1v  Result Date: 11/08/2016 CLINICAL DATA:  83 year old male status post feeding tube placement EXAM: PORTABLE ABDOMEN - 1 VIEW COMPARISON:  Prior chest x-ray 11/07/2016 FINDINGS: Weighted tip enteric feeding tube. The  tip overlies the expected location of the fourth portion of the duodenum. The bowel gas pattern is not obstructed. Incompletely imaged cardiomegaly and bibasilar airspace opacities. Layering left pleural  effusion. No acute osseous abnormality. Multilevel degenerative disc disease. Surgical clips project over the region of the gastroesophageal junction, over the cardiac silhouette and over the anatomic pelvis. IMPRESSION: 1. The tip of the transpyloric feeding tube overlies the expected location of the fourth portion of the duodenum. 2. Bibasilar patchy airspace opacities and a left-sided pleural effusion. Differential considerations include pulmonary edema and multi lobar pneumonia. 3. Cardiomegaly. Electronically Signed   By: Jacqulynn Cadet M.D.   On: 11/08/2016 10:05    Labs:  CBC:  Recent Labs  11/29/16 0643 12/04/16 1139 12/05/16 0552 12/08/16 0702  WBC 15.8* 12.7* 11.4* 8.6  HGB 9.3* 9.3* 9.5* 7.9*  HCT 30.9* 31.3* 33.9* 27.0*  PLT 217 165 163 171    COAGS:  Recent Labs  10/31/16 1921 11/20/16 1508 12/06/16 0735  INR 1.10  --  1.19  APTT 21* >200*  --     BMP:  Recent Labs  12/04/16 0711 12/05/16 0552 12/06/16 0540 12/08/16 0702  NA 157* 150* 153* 147*  K 4.2 3.5 3.6 3.8  CL 111 108 105 102  CO2 37* 39* 40* 36*  GLUCOSE 133* 189* 125* 97  BUN 57* 47* 47* 38*  CALCIUM 8.4* 8.0* 8.1* 8.2*  CREATININE 2.00* 1.97* 1.81* 1.78*  GFRNONAA 30* 30* 33* 34*  GFRAA 34* 35* 39* 39*    LIVER FUNCTION TESTS:  Recent Labs  10/31/16 1921 11/13/16 0126 11/14/16 0227  BILITOT 1.3* 1.0 1.0  AST 57* 46* 46*  ALT 27 20 11*  ALKPHOS 195* 123 114  PROT 7.1 6.4* 6.0*  ALBUMIN 3.2* 1.6* 1.5*    TUMOR MARKERS: No results for input(s): AFPTM, CEA, CA199, CHROMGRNA in the last 8760 hours.  Assessment and Plan:  Hypoxia; encephalopathy Parkinson's Vent/trach Dysphagia and need for long term care Protein calorie malnutrition Scheduled for percutaneous gastric vs gastric - jejunal tube placement Risks and benefits discussed with the patient's daughter [name] via phone including, but not limited to the need for a barium enema during the procedure, bleeding,  infection, peritonitis, or damage to adjacent structures. All of her questions were answered, she is agreeable to proceed. Consent signed and in chart.    Thank you for this interesting consult.  I greatly enjoyed meeting REMO KIRSCHENMANN and look forward to participating in their care.  A copy of this report was sent to the requesting provider on this date.  Electronically Signed: Lavonia Drafts, PA-C 12/08/2016, 9:27 AM   I spent a total of 40 Minutes    in face to face in clinical consultation, greater than 50% of which was counseling/coordinating care for percutaneous G vs GJ tube placement

## 2016-12-09 LAB — CBC
HCT: 30.7 % — ABNORMAL LOW (ref 39.0–52.0)
Hemoglobin: 8.9 g/dL — ABNORMAL LOW (ref 13.0–17.0)
MCH: 29.4 pg (ref 26.0–34.0)
MCHC: 29 g/dL — AB (ref 30.0–36.0)
MCV: 101.3 fL — ABNORMAL HIGH (ref 78.0–100.0)
Platelets: 193 10*3/uL (ref 150–400)
RBC: 3.03 MIL/uL — ABNORMAL LOW (ref 4.22–5.81)
RDW: 19.7 % — AB (ref 11.5–15.5)
WBC: 9.4 10*3/uL (ref 4.0–10.5)

## 2016-12-10 ENCOUNTER — Encounter (HOSPITAL_COMMUNITY): Payer: Self-pay | Admitting: Interventional Radiology

## 2016-12-10 ENCOUNTER — Other Ambulatory Visit (HOSPITAL_COMMUNITY): Payer: Self-pay

## 2016-12-10 HISTORY — PX: IR GASTROSTOMY TUBE MOD SED: IMG625

## 2016-12-10 MED ORDER — IOPAMIDOL (ISOVUE-300) INJECTION 61%: 5.0000 mL | Freq: Once | INTRAVENOUS | Status: DC | PRN

## 2016-12-10 MED ORDER — MIDAZOLAM HCL 2 MG/2ML IJ SOLN
INTRAMUSCULAR | Status: AC
  Filled 2016-12-10: qty 2

## 2016-12-10 MED ORDER — FENTANYL CITRATE (PF) 100 MCG/2ML IJ SOLN
INTRAMUSCULAR | Status: AC | PRN
  Administered 2016-12-10 (×2): 50 ug via INTRAVENOUS

## 2016-12-10 MED ORDER — LIDOCAINE HCL 1 % IJ SOLN
INTRAMUSCULAR | Status: AC
  Filled 2016-12-10: qty 20

## 2016-12-10 MED ORDER — GLUCAGON HCL (RDNA) 1 MG IJ SOLR
INTRAMUSCULAR | Status: AC | PRN
  Administered 2016-12-10: 1 mg via INTRAVENOUS

## 2016-12-10 MED ORDER — LIDOCAINE HCL (PF) 1 % IJ SOLN
INTRAMUSCULAR | Status: AC | PRN
  Administered 2016-12-10: 8 mL

## 2016-12-10 MED ORDER — GLUCAGON HCL RDNA (DIAGNOSTIC) 1 MG IJ SOLR
INTRAMUSCULAR | Status: AC
  Filled 2016-12-10: qty 1

## 2016-12-10 MED ORDER — CEFAZOLIN SODIUM-DEXTROSE 2-4 GM/100ML-% IV SOLN
INTRAVENOUS | Status: AC
  Administered 2016-12-10: 2 g
  Filled 2016-12-10: qty 100

## 2016-12-10 MED ORDER — FENTANYL CITRATE (PF) 100 MCG/2ML IJ SOLN
INTRAMUSCULAR | Status: AC
  Filled 2016-12-10: qty 2

## 2016-12-10 MED ORDER — MIDAZOLAM HCL 2 MG/2ML IJ SOLN
INTRAMUSCULAR | Status: AC | PRN
  Administered 2016-12-10 (×2): 1 mg via INTRAVENOUS

## 2016-12-10 MED ORDER — IOPAMIDOL (ISOVUE-300) INJECTION 61%
INTRAVENOUS | Status: AC
  Filled 2016-12-10: qty 50

## 2016-12-10 NOTE — Procedures (Signed)
20 Fr pull through G tube EBL 0 Comp 0 

## 2016-12-11 LAB — BASIC METABOLIC PANEL
Anion gap: 8 (ref 5–15)
BUN: 27 mg/dL — AB (ref 6–20)
CALCIUM: 8.4 mg/dL — AB (ref 8.9–10.3)
CO2: 34 mmol/L — ABNORMAL HIGH (ref 22–32)
CREATININE: 1.79 mg/dL — AB (ref 0.61–1.24)
Chloride: 101 mmol/L (ref 101–111)
GFR calc Af Amer: 39 mL/min — ABNORMAL LOW (ref >60)
GFR, EST NON AFRICAN AMERICAN: 34 mL/min — AB (ref >60)
GLUCOSE: 106 mg/dL — AB (ref 65–99)
Potassium: 3.7 mmol/L (ref 3.5–5.1)
SODIUM: 143 mmol/L (ref 135–145)

## 2016-12-11 LAB — CBC
HCT: 29.8 % — ABNORMAL LOW (ref 39.0–52.0)
Hemoglobin: 8.7 g/dL — ABNORMAL LOW (ref 13.0–17.0)
MCH: 29.6 pg (ref 26.0–34.0)
MCHC: 29.2 g/dL — AB (ref 30.0–36.0)
MCV: 101.4 fL — ABNORMAL HIGH (ref 78.0–100.0)
PLATELETS: 210 10*3/uL (ref 150–400)
RBC: 2.94 MIL/uL — ABNORMAL LOW (ref 4.22–5.81)
RDW: 20.6 % — AB (ref 11.5–15.5)
WBC: 10.7 10*3/uL — ABNORMAL HIGH (ref 4.0–10.5)

## 2016-12-12 NOTE — Progress Notes (Signed)
Referring Physician(s):  Dr. Laren Everts  Supervising Physician: Aletta Edouard  Patient Status:  [facility] - In-pt  Chief Complaint:  Dysphagia  Subjective: Patient resting comfortably.  Open eyes, but quickly falls asleep during assessment.   Allergies: Patient has no known allergies.  Medications: Prior to Admission medications   Medication Sig Start Date End Date Taking? Authorizing Provider  albuterol (PROVENTIL) (2.5 MG/3ML) 0.083% nebulizer solution Take 3 mLs (2.5 mg total) by nebulization every 2 (two) hours as needed for wheezing or shortness of breath. 11/18/16   Desai, Rahul P, PA-C  amantadine (SYMMETREL) 50 MG/5ML solution Take 10 mLs (100 mg total) by mouth 3 (three) times daily. 11/18/16   Desai, Rahul P, PA-C  Amino Acids-Protein Hydrolys (FEEDING SUPPLEMENT, PRO-STAT SUGAR FREE 64,) LIQD Place 30 mLs into feeding tube 2 (two) times daily. 11/18/16   Desai, Rahul P, PA-C  aspirin 81 MG chewable tablet Chew 1 tablet (81 mg total) by mouth daily. 11/19/16   Desai, Rahul P, PA-C  atorvastatin (LIPITOR) 10 MG tablet Take 1 tablet (10 mg total) by mouth at bedtime. 11/18/16   Desai, Rahul P, PA-C  carbidopa-levodopa (SINEMET IR) 25-100 MG tablet Take 1 tablet by mouth 3 (three) times daily. 11/18/16   Desai, Rahul P, PA-C  chlorhexidine gluconate, MEDLINE KIT, (PERIDEX) 0.12 % solution 15 mLs by Mouth Rinse route 2 (two) times daily. 11/18/16   Desai, Rahul P, PA-C  fentaNYL (DURAGESIC - DOSED MCG/HR) 25 MCG/HR patch Place 1 patch (25 mcg total) onto the skin every 3 (three) days. 11/20/16   Desai, Rahul P, PA-C  furosemide (LASIX) 20 MG tablet Place 3 tablets (60 mg total) into feeding tube 2 (two) times daily. 11/18/16   Desai, Rahul P, PA-C  gabapentin (NEURONTIN) 250 MG/5ML solution Place 6 mLs (300 mg total) into feeding tube every 8 (eight) hours. 11/18/16   Desai, Rahul P, PA-C  heparin 100-0.45 UNIT/ML-% infusion Inject 2,000 Units/hr into the vein continuous. 11/18/16   Desai, Rahul  P, PA-C  Hydrocortisone (GERHARDT'S BUTT CREAM) CREA Apply 1 application topically 4 (four) times daily. 11/18/16   Desai, Rahul P, PA-C  insulin aspart (NOVOLOG) 100 UNIT/ML injection Inject 0-9 Units into the skin every 4 (four) hours. 11/18/16   Desai, Rahul P, PA-C  LORazepam (ATIVAN) 1 MG tablet Take 1 tablet (1 mg total) by mouth every 12 (twelve) hours. 11/18/16   Desai, Rahul P, PA-C  Maltodextrin-Xanthan Gum (RESOURCE THICKENUP CLEAR) POWD Take 120 g by mouth as needed. 11/18/16   Desai, Rahul P, PA-C  meropenem 1 g in sodium chloride 0.9 % 100 mL Inject 1 g into the vein every 12 (twelve) hours. 11/18/16   Desai, Rahul P, PA-C  metoprolol tartrate (LOPRESSOR) 25 MG tablet Place 1 tablet (25 mg total) into feeding tube 2 (two) times daily. 11/18/16   Desai, Rahul P, PA-C  midazolam (VERSED) 2 MG/2ML SOLN injection Inject 1 mL (1 mg total) into the vein every 2 (two) hours as needed for agitation. 11/18/16   Shearon Stalls, Rahul P, PA-C  mouth rinse LIQD solution 15 mLs by Mouth Rinse route QID. 11/19/16   Desai, Rahul P, PA-C  Nutritional Supplements (FEEDING SUPPLEMENT, JEVITY 1.2 CAL,) LIQD Place 1,000 mLs into feeding tube continuous. 11/18/16   Desai, Rahul P, PA-C  pantoprazole sodium (PROTONIX) 40 mg/20 mL PACK Place 20 mLs (40 mg total) into feeding tube daily. 11/19/16   Desai, Rahul P, PA-C  primidone (MYSOLINE) 50 MG tablet Take 1 tablet (50  mg total) by mouth every 12 (twelve) hours. 11/18/16   Desai, Rahul P, PA-C  rOPINIRole (REQUIP) 2 MG tablet Take 1 tablet (2 mg total) by mouth every 12 (twelve) hours. 11/18/16   Desai, Rahul P, PA-C     Vital Signs: BP (!) 119/59   Pulse 72   Resp 14   SpO2 97%   Physical Exam  NAD, sleepy Abd:  Gastrostomy in place.  Tubefeeding infusing.  Site intact, without erythema or blood.  Soft, non-tender.   Imaging: Ir Gastrostomy Tube Mod Sed  Result Date: 12/10/2016 INDICATION: Stroke EXAM: PERC PLACEMENT GASTROSTOMY MEDICATIONS: Ancef 2 g; Antibiotics  were administered within 1 hour of the procedure. Glucagon 1 mg IV ANESTHESIA/SEDATION: Versed 2 mg IV; Fentanyl 100 mcg IV Moderate Sedation Time:  15 The patient was continuously monitored during the procedure by the interventional radiology nurse under my direct supervision. CONTRAST:  5 cc Isovue 370 - administered into the gastric lumen. FLUOROSCOPY TIME:  Fluoroscopy Time: 1 minutes 48 seconds (10 mGy). COMPLICATIONS: None immediate. PROCEDURE: The procedure, risks, benefits, and alternatives were explained to the patient. Questions regarding the procedure were encouraged and answered. The patient understands and consents to the procedure. The epigastrium was prepped with Betadine in a sterile fashion, and a sterile drape was applied covering the operative field. A sterile gown and sterile gloves were used for the procedure. A 5-French orogastric tube is placed under fluoroscopic guidance. Scout imaging of the abdomen confirms barium within the transverse colon. The stomach was distended with gas. Under fluoroscopic guidance, an 18 gauge needle was utilized to puncture the anterior wall of the body of the stomach. An Amplatz wire was advanced through the needle passing a T fastener into the lumen of the stomach. The T fastener was secured for gastropexy. A 9-French sheath was inserted. A snare was advanced through the 9-French sheath. A Britta Mccreedy was advanced through the orogastric tube. It was snared then pulled out the oral cavity, pulling the snare, as well. The leading edge of the gastrostomy was attached to the snare. It was then pulled down the esophagus and out the percutaneous site. It was secured in place. Contrast was injected. The image demonstrates placement of a 20-French pull-through type gastrostomy tube into the body of the stomach. IMPRESSION: Successful 20 French pull-through gastrostomy. Electronically Signed   By: Marybelle Killings M.D.   On: 12/10/2016 16:18    Labs:  CBC:  Recent Labs   12/05/16 0552 12/08/16 0702 12/09/16 0600 12/11/16 0706  WBC 11.4* 8.6 9.4 10.7*  HGB 9.5* 7.9* 8.9* 8.7*  HCT 33.9* 27.0* 30.7* 29.8*  PLT 163 171 193 210    COAGS:  Recent Labs  10/31/16 1921 11/20/16 1508 12/06/16 0735  INR 1.10  --  1.19  APTT 21* >200*  --     BMP:  Recent Labs  12/05/16 0552 12/06/16 0540 12/08/16 0702 12/11/16 0706  NA 150* 153* 147* 143  K 3.5 3.6 3.8 3.7  CL 108 105 102 101  CO2 39* 40* 36* 34*  GLUCOSE 189* 125* 97 106*  BUN 47* 47* 38* 27*  CALCIUM 8.0* 8.1* 8.2* 8.4*  CREATININE 1.97* 1.81* 1.78* 1.79*  GFRNONAA 30* 33* 34* 34*  GFRAA 35* 39* 39* 39*    LIVER FUNCTION TESTS:  Recent Labs  10/31/16 1921 11/13/16 0126 11/14/16 0227  BILITOT 1.3* 1.0 1.0  AST 57* 46* 46*  ALT 27 20 11*  ALKPHOS 195* 123 114  PROT 7.1 6.4* 6.0*  ALBUMIN 3.2* 1.6* 1.5*    Assessment and Plan: Dysphagia Gastrostomy in place without issues. Bumper appropriately placed at skin.  Site assessed- clean, dry, intact.  Ready for use and already has tube feeding infusing.  IR available if needed.   Electronically Signed: Docia Barrier, PA 12/12/2016, 10:14 AM   I spent a total of 15 Minutes at the the patient's bedside AND on the patient's hospital floor or unit, greater than 50% of which was counseling/coordinating care for dysphagia

## 2016-12-15 LAB — BLOOD GAS, ARTERIAL
ACID-BASE EXCESS: 11.1 mmol/L — AB (ref 0.0–2.0)
BICARBONATE: 35.3 mmol/L — AB (ref 20.0–28.0)
FIO2: 40
O2 SAT: 99.4 %
PATIENT TEMPERATURE: 98.6
pCO2 arterial: 48.4 mmHg — ABNORMAL HIGH (ref 32.0–48.0)
pH, Arterial: 7.477 — ABNORMAL HIGH (ref 7.350–7.450)
pO2, Arterial: 160 mmHg — ABNORMAL HIGH (ref 83.0–108.0)

## 2016-12-18 LAB — CULTURE, RESPIRATORY

## 2016-12-18 LAB — CULTURE, RESPIRATORY W GRAM STAIN

## 2016-12-21 LAB — VANCOMYCIN, TROUGH: Vancomycin Tr: 18 ug/mL (ref 15–20)

## 2016-12-22 ENCOUNTER — Other Ambulatory Visit (HOSPITAL_COMMUNITY): Payer: Self-pay

## 2016-12-23 LAB — CBC
HEMATOCRIT: 28.4 % — AB (ref 39.0–52.0)
HEMOGLOBIN: 8.6 g/dL — AB (ref 13.0–17.0)
MCH: 29.5 pg (ref 26.0–34.0)
MCHC: 30.3 g/dL (ref 30.0–36.0)
MCV: 97.3 fL (ref 78.0–100.0)
PLATELETS: 270 10*3/uL (ref 150–400)
RBC: 2.92 MIL/uL — AB (ref 4.22–5.81)
RDW: 19.9 % — ABNORMAL HIGH (ref 11.5–15.5)
WBC: 10.3 10*3/uL (ref 4.0–10.5)

## 2016-12-23 LAB — BASIC METABOLIC PANEL
Anion gap: 9 (ref 5–15)
BUN: 32 mg/dL — AB (ref 6–20)
CHLORIDE: 91 mmol/L — AB (ref 101–111)
CO2: 33 mmol/L — AB (ref 22–32)
Calcium: 8.6 mg/dL — ABNORMAL LOW (ref 8.9–10.3)
Creatinine, Ser: 1.83 mg/dL — ABNORMAL HIGH (ref 0.61–1.24)
GFR calc Af Amer: 38 mL/min — ABNORMAL LOW (ref >60)
GFR calc non Af Amer: 33 mL/min — ABNORMAL LOW (ref >60)
GLUCOSE: 106 mg/dL — AB (ref 65–99)
POTASSIUM: 4.1 mmol/L (ref 3.5–5.1)
Sodium: 133 mmol/L — ABNORMAL LOW (ref 135–145)

## 2016-12-26 ENCOUNTER — Encounter: Payer: Self-pay | Admitting: Neurology

## 2017-01-30 ENCOUNTER — Telehealth: Payer: Self-pay | Admitting: Neurology

## 2017-01-30 NOTE — Telephone Encounter (Signed)
They did contact me some time ago right when he entered the hospital about weaning medication and it probably was appropriate to stop that.  It really is hard for me to comment since a lot has happened since I have seen him.  My goal was to wean that medication because of hallucinations/cognitive change.

## 2017-01-30 NOTE — Telephone Encounter (Signed)
Received voicemail from Page (patient's daughter) [phone] with some concerns.   States [facility] has been at [facility] and voiced a lot of concerns about care at the facility. She did state they told her that a doctor at that facility contacted you to get permission to stop Requip. She was checking if this was true. Please advise.

## 2017-02-02 NOTE — Telephone Encounter (Signed)
Patients daughter made aware

## 2017-02-13 ENCOUNTER — Encounter (HOSPITAL_COMMUNITY): Payer: Self-pay

## 2017-02-13 ENCOUNTER — Emergency Department (EMERGENCY_DEPARTMENT_HOSPITAL)
Admission: EM | Admit: 2017-02-13 | Discharge: 2017-02-15 | Disposition: A | Discharge status: Home / Self Care | Payer: Medicare Other | Source: Home / Self Care | Attending: Emergency Medicine | Admitting: Emergency Medicine

## 2017-02-13 ENCOUNTER — Other Ambulatory Visit: Payer: Self-pay

## 2017-02-13 DIAGNOSIS — I2510 Atherosclerotic heart disease of native coronary artery without angina pectoris: Secondary | ICD-10-CM

## 2017-02-13 DIAGNOSIS — F4325 Adjustment disorder with mixed disturbance of emotions and conduct: Secondary | ICD-10-CM | POA: Diagnosis present

## 2017-02-13 DIAGNOSIS — Z87891 Personal history of nicotine dependence: Secondary | ICD-10-CM

## 2017-02-13 DIAGNOSIS — I5043 Acute on chronic combined systolic (congestive) and diastolic (congestive) heart failure: Secondary | ICD-10-CM

## 2017-02-13 DIAGNOSIS — Z7982 Long term (current) use of aspirin: Secondary | ICD-10-CM

## 2017-02-13 DIAGNOSIS — I110 Hypertensive heart disease with heart failure: Secondary | ICD-10-CM | POA: Insufficient documentation

## 2017-02-13 DIAGNOSIS — G20 Parkinson's disease: Secondary | ICD-10-CM | POA: Insufficient documentation

## 2017-02-13 DIAGNOSIS — Z951 Presence of aortocoronary bypass graft: Secondary | ICD-10-CM | POA: Insufficient documentation

## 2017-02-13 DIAGNOSIS — Z79899 Other long term (current) drug therapy: Secondary | ICD-10-CM

## 2017-02-13 DIAGNOSIS — R413 Other amnesia: Secondary | ICD-10-CM | POA: Diagnosis not present

## 2017-02-13 DIAGNOSIS — J189 Pneumonia, unspecified organism: Secondary | ICD-10-CM | POA: Diagnosis not present

## 2017-02-13 MED ORDER — ALUM & MAG HYDROXIDE-SIMETH 200-200-20 MG/5ML PO SUSP: 30.0000 mL | Freq: Four times a day (QID) | ORAL | Status: DC | PRN

## 2017-02-13 MED ORDER — ACETAMINOPHEN 325 MG PO TABS
650.0000 mg | ORAL_TABLET | ORAL | Status: DC | PRN
  Administered 2017-02-14 – 2017-02-15 (×5): 650 mg via ORAL
  Filled 2017-02-13 (×5): qty 2

## 2017-02-13 MED ORDER — ONDANSETRON HCL 4 MG PO TABS: 4.0000 mg | ORAL_TABLET | Freq: Three times a day (TID) | ORAL | Status: DC | PRN

## 2017-02-13 NOTE — ED Notes (Signed)
Bed: [facility] Expected date:  Expected time:  Means of arrival:  Comments: EMS 83 yo Suicidal/IVC

## 2017-02-13 NOTE — ED Provider Notes (Addendum)
[facility] DEPT Provider Note   CSN: [id number] Arrival date & time: 02/13/17  2310     History   Chief Complaint Chief Complaint  Patient presents with  . IVC  . Suicidal    HPI Jack Carter is a 83 y.o. male.  HPI 83 yo male brought to ER under IVC from his daughter for suicidal ideation. Per IVC the pt is hostile and aggressive towards family. Reported that he grabbed a knife from the kitchen tonight but didn't say what he was going to do with it.  Reportedly began to laugh and stated he had other ways to kill himself.    Pt denies SI at this time. Reports he is not aggressive and does not yell at his family members.     Past Medical History:  Diagnosis Date  . Cellulitis   . Coronary artery disease    CABG  . Essential hypertension   . Heart attack ([place])   . Hyperlipidemia   . Hypertension   . Parkinson's disease ([place])   . Restless leg syndrome     Patient Active Problem List   Diagnosis Date Noted  . Acute tracheostomy management ([place])   . Bradycardia, sinus   . Dyspnea   . Acute respiratory failure ([place])   . Coronary artery disease involving native coronary artery of native heart without angina pectoris   . Acute on chronic combined systolic and diastolic CHF (congestive heart failure) ([place])   . Great toe pain, unspecified laterality   . Aspiration into airway   . Hypernatremia   . Elevated troponin 11/04/2016  . Acute encephalopathy   . Goals of care, counseling/discussion   . Septic shock ([place]) 10/31/2016  . Cellulitis 10/31/2016  . Hyperkalemia 10/31/2016  . AKI (acute kidney injury) ([place]) 10/31/2016  . Cardiogenic shock ([place]) 10/31/2016  . Acute hypoxemic respiratory failure ([place]) 10/31/2016  . CAD (coronary artery disease) of artery bypass graft 10/31/2016  . CHF (congestive heart failure) ([place]) 10/31/2016  . Parkinson disease ([place]) 10/31/2016  . Upper GI bleed 10/31/2016    Past Surgical History:    Procedure Laterality Date  . APPENDECTOMY    . CARDIAC SURGERY  2000  . CARPAL TUNNEL RELEASE Left   . CORONARY ARTERY BYPASS GRAFT    . IR GASTROSTOMY TUBE MOD SED  12/10/2016  . STOMACH SURGERY  2003   intestinal fissure       Home Medications    Prior to Admission medications   Medication Sig Start Date End Date Taking? Authorizing Provider  albuterol (PROVENTIL) (2.5 MG/3ML) 0.083% nebulizer solution Take 3 mLs (2.5 mg total) by nebulization every 2 (two) hours as needed for wheezing or shortness of breath. 11/18/16   Desai, Rahul P, PA-C  amantadine (SYMMETREL) 100 MG capsule Take 100 mg by mouth 3 (three) times daily.    [provider]  amantadine (SYMMETREL) 50 MG/5ML solution Take 10 mLs (100 mg total) by mouth 3 (three) times daily. 11/18/16   Desai, Rahul P, PA-C  Amino Acids-Protein Hydrolys (FEEDING SUPPLEMENT, PRO-STAT SUGAR FREE 64,) LIQD Place 30 mLs into feeding tube 2 (two) times daily. 11/18/16   Desai, Rahul P, PA-C  Apomorphine HCl (APOKYN) 30 MG/3ML SOCT Inject into the skin 5 (five) times daily.    [provider]  aspirin 81 MG chewable tablet Chew 1 tablet (81 mg total) by mouth daily. 11/19/16   Shearon Stalls, Rahul P, PA-C  aspirin EC 81 MG tablet Take 81 mg  by mouth daily.    [provider]  atorvastatin (LIPITOR) 10 MG tablet Take 10 mg by mouth daily.    [provider]  atorvastatin (LIPITOR) 10 MG tablet Take 1 tablet (10 mg total) by mouth at bedtime. 11/18/16   Desai, Rahul P, PA-C  carbidopa-levodopa (SINEMET IR) 25-100 MG tablet Take 1 tablet by mouth 2 (two) times daily. 10/30/16   Tat, Eustace Quail, DO  carbidopa-levodopa (SINEMET IR) 25-100 MG tablet Take 1 tablet by mouth 3 (three) times daily. 11/18/16   Desai, Rahul P, PA-C  carbidopa-levodopa-entacapone (STALEVO) 12.5-50-200 MG tablet Take 1 tablet by mouth 4 (four) times daily.    [provider]  chlorhexidine gluconate, MEDLINE KIT, (PERIDEX) 0.12 % solution 15  mLs by Mouth Rinse route 2 (two) times daily. 11/18/16   Desai, Rahul P, PA-C  fentaNYL (DURAGESIC - DOSED MCG/HR) 25 MCG/HR patch Place 1 patch (25 mcg total) onto the skin every 3 (three) days. 11/20/16   Desai, Rahul P, PA-C  furosemide (LASIX) 20 MG tablet Place 3 tablets (60 mg total) into feeding tube 2 (two) times daily. 11/18/16   Desai, Rahul P, PA-C  furosemide (LASIX) 40 MG tablet Take 80 mg by mouth daily.    [provider]  gabapentin (NEURONTIN) 250 MG/5ML solution Place 6 mLs (300 mg total) into feeding tube every 8 (eight) hours. 11/18/16   Desai, Rahul P, PA-C  gabapentin (NEURONTIN) 600 MG tablet Take 600 mg by mouth 4 (four) times daily.    [provider]  heparin 100-0.45 UNIT/ML-% infusion Inject 2,000 Units/hr into the vein continuous. 11/18/16   Desai, Rahul P, PA-C  Hydrocortisone (GERHARDT'S BUTT CREAM) CREA Apply 1 application topically 4 (four) times daily. 11/18/16   Desai, Rahul P, PA-C  insulin aspart (NOVOLOG) 100 UNIT/ML injection Inject 0-9 Units into the skin every 4 (four) hours. 11/18/16   Desai, Rahul P, PA-C  LORazepam (ATIVAN) 1 MG tablet Take 1 tablet (1 mg total) by mouth every 12 (twelve) hours. 11/18/16   Desai, Rahul P, PA-C  Maltodextrin-Xanthan Gum (RESOURCE THICKENUP CLEAR) POWD Take 120 g by mouth as needed. 11/18/16   Desai, Rahul P, PA-C  meropenem 1 g in sodium chloride 0.9 % 100 mL Inject 1 g into the vein every 12 (twelve) hours. 11/18/16   Desai, Rahul P, PA-C  metolazone (ZAROXOLYN) 5 MG tablet Take 5 mg by mouth 3 (three) times a week.    [provider]  metoprolol tartrate (LOPRESSOR) 25 MG tablet Place 1 tablet (25 mg total) into feeding tube 2 (two) times daily. 11/18/16   Desai, Rahul P, PA-C  midazolam (VERSED) 2 MG/2ML SOLN injection Inject 1 mL (1 mg total) into the vein every 2 (two) hours as needed for agitation. 11/18/16   Shearon Stalls, Rahul P, PA-C  mouth rinse LIQD solution 15 mLs by Mouth Rinse route QID. 11/19/16   Desai,  Rahul P, PA-C  MULTIPLE VITAMIN PO Take by mouth daily.    [provider]  Nutritional Supplements (FEEDING SUPPLEMENT, JEVITY 1.2 CAL,) LIQD Place 1,000 mLs into feeding tube continuous. 11/18/16   Desai, Rahul P, PA-C  omeprazole (PRILOSEC) 40 MG capsule Take 40 mg by mouth daily.    [provider]  pantoprazole sodium (PROTONIX) 40 mg/20 mL PACK Place 20 mLs (40 mg total) into feeding tube daily. 11/19/16   Desai, Rahul P, PA-C  potassium chloride SA (K-DUR,KLOR-CON) 20 MEQ tablet Take 20 mEq by mouth. 2 in the morning, one  at night    [provider]  primidone (MYSOLINE) 50 MG tablet Take 50 mg by mouth 2 (two) times daily.    [provider]  primidone (MYSOLINE) 50 MG tablet Take 1 tablet (50 mg total) by mouth every 12 (twelve) hours. 11/18/16   Desai, Rahul P, PA-C  propranolol (INDERAL) 60 MG tablet Take 60 mg by mouth 2 (two) times daily.    [provider]  rOPINIRole (REQUIP XL) 4 MG 24 hr tablet Take 4 mg by mouth 3 (three) times daily.    [provider]  rOPINIRole (REQUIP) 2 MG tablet Take 1 tablet (2 mg total) by mouth every 12 (twelve) hours. 11/18/16   Desai, Rahul P, PA-C  sulfamethoxazole-trimethoprim (BACTRIM DS,SEPTRA DS) 800-160 MG tablet TK 1 T PO BID FOR 10 DAYS 10/15/16   [provider]  traMADol (ULTRAM) 50 MG tablet Take by mouth 3 (three) times daily.    [provider]  trimethobenzamide (TIGAN) 300 MG capsule Take 300 mg by mouth 3 (three) times daily.    [provider]  valsartan (DIOVAN) 160 MG tablet Take 160 mg by mouth 2 (two) times daily.    [provider]    Family History Family History  Problem Relation Age of Onset  . Heart disease Mother   . Cancer Mother   . Pneumonia Father     Social History Social History   Tobacco Use  . Smoking status: Former Research scientist (life sciences)  . Smokeless tobacco: Never Used  Substance Use Topics  . Alcohol use: No  . Drug use: No      Allergies   Patient has no known allergies.   Review of Systems Review of Systems  All other systems reviewed and are negative.    Physical Exam Updated Vital Signs BP (!) 101/57 (BP Location: Left Arm)   Pulse (!) 53   Temp 97.7 F (36.5 C) (Oral)   Resp 18   SpO2 100%   Physical Exam  Constitutional: He is oriented to person, place, and time. He appears well-developed and well-nourished.  HENT:  Head: Normocephalic and atraumatic.  Eyes: EOM are normal.  Neck: Normal range of motion.  Cardiovascular: Normal rate, regular rhythm and normal heart sounds.  Pulmonary/Chest: Effort normal and breath sounds normal. No respiratory distress.  Abdominal: Soft. He exhibits no distension. There is no tenderness.  Musculoskeletal: Normal range of motion.  Neurological: He is alert and oriented to person, place, and time.  Skin: Skin is warm and dry.  Psychiatric: He has a normal mood and affect.  Nursing note and vitals reviewed.    ED Treatments / Results  Labs (all labs ordered are listed, but only abnormal results are displayed) Labs Reviewed  COMPREHENSIVE METABOLIC PANEL  ETHANOL  SALICYLATE LEVEL  ACETAMINOPHEN LEVEL  CBC  RAPID URINE DRUG SCREEN, HOSP PERFORMED  URINALYSIS, ROUTINE W REFLEX MICROSCOPIC    EKG  EKG Interpretation  Date/Time:  Saturday February 14 2017 01:25:06 EST Ventricular Rate:  50 PR Interval:    QRS Duration: 94 QT Interval:  466 QTC Calculation: 424 R Axis:   54 Text Interpretation:  Normal sinus rhythm Abnormal ECG No old tracing to compare Confirmed by Jola Schmidt [phone]) on 02/14/2017 6:31:46 AM       Radiology No results found.  Procedures Procedures (including critical care time)  Medications Ordered in ED Medications  acetaminophen (TYLENOL) tablet 650 mg (not administered)  ondansetron (ZOFRAN) tablet 4 mg (not administered)  alum &  mag hydroxide-simeth (MAALOX/MYLANTA) 200-200-20 MG/5ML suspension 30 mL  (not administered)     Initial Impression / Assessment and Plan / ED Course  I have reviewed the triage vital signs and the nursing notes.  Pertinent labs & imaging results that were available during my care of the patient were reviewed by me and considered in my medical decision making (see chart for details).     Under IVC from daughter. Will need TTS and psychiatry evaluation  Final Clinical Impressions(s) / ED Diagnoses   Final diagnoses:  None    ED Discharge Orders    None       Jola Schmidt, MD 02/14/17 0001    Jola Schmidt, MD 02/14/17 [phone]

## 2017-02-13 NOTE — ED Triage Notes (Signed)
IVC paperwork state, "The respondent is hostile and aggressive towards family. The respondent constantlly yells and argues with family members. The respondent reports suicidal ideation but does not state a plan to carry out a suicide.  The respondent went in to the kitchen and took a butter knife but did not state what he was going to do with the knife. The respondent began laughing and stated that he had another way to kill himself. The respondent is a danger to himself." Hx Parkinson's- slurred speech and tremors at baseline. He denies SI/HI at this time. Calm and cooperative in triage.

## 2017-02-14 DIAGNOSIS — F4325 Adjustment disorder with mixed disturbance of emotions and conduct: Secondary | ICD-10-CM | POA: Diagnosis present

## 2017-02-14 LAB — URINALYSIS, ROUTINE W REFLEX MICROSCOPIC
Bilirubin Urine: NEGATIVE
GLUCOSE, UA: NEGATIVE mg/dL
KETONES UR: NEGATIVE mg/dL
NITRITE: NEGATIVE
PH: 6 (ref 5.0–8.0)
Protein, ur: NEGATIVE mg/dL
SPECIFIC GRAVITY, URINE: 1.012 (ref 1.005–1.030)
Squamous Epithelial / LPF: NONE SEEN

## 2017-02-14 LAB — RAPID URINE DRUG SCREEN, HOSP PERFORMED
Amphetamines: NOT DETECTED
Barbiturates: POSITIVE — AB
Benzodiazepines: NOT DETECTED
Cocaine: NOT DETECTED
OPIATES: NOT DETECTED
Tetrahydrocannabinol: NOT DETECTED

## 2017-02-14 LAB — COMPREHENSIVE METABOLIC PANEL
ALK PHOS: 133 U/L — AB (ref 38–126)
ALT: 9 U/L — AB (ref 17–63)
AST: 52 U/L — ABNORMAL HIGH (ref 15–41)
Albumin: 3.1 g/dL — ABNORMAL LOW (ref 3.5–5.0)
Anion gap: 10 (ref 5–15)
BUN: 19 mg/dL (ref 6–20)
CALCIUM: 9 mg/dL (ref 8.9–10.3)
CO2: 35 mmol/L — ABNORMAL HIGH (ref 22–32)
CREATININE: 1.71 mg/dL — AB (ref 0.61–1.24)
Chloride: 93 mmol/L — ABNORMAL LOW (ref 101–111)
GFR, EST AFRICAN AMERICAN: 41 mL/min — AB (ref >60)
GFR, EST NON AFRICAN AMERICAN: 36 mL/min — AB (ref >60)
Glucose, Bld: 92 mg/dL (ref 65–99)
Potassium: 4.4 mmol/L (ref 3.5–5.1)
Sodium: 138 mmol/L (ref 135–145)
TOTAL PROTEIN: 8.4 g/dL — AB (ref 6.5–8.1)
Total Bilirubin: 0.8 mg/dL (ref 0.3–1.2)

## 2017-02-14 LAB — ACETAMINOPHEN LEVEL: Acetaminophen (Tylenol), Serum: <10 ug/mL — ABNORMAL LOW (ref 10–30)

## 2017-02-14 LAB — ETHANOL

## 2017-02-14 LAB — CBC
HCT: 41.8 % (ref 39.0–52.0)
Hemoglobin: 13.6 g/dL (ref 13.0–17.0)
MCH: 31.5 pg (ref 26.0–34.0)
MCHC: 32.5 g/dL (ref 30.0–36.0)
MCV: 96.8 fL (ref 78.0–100.0)
PLATELETS: 249 10*3/uL (ref 150–400)
RBC: 4.32 MIL/uL (ref 4.22–5.81)
RDW: 14.9 % (ref 11.5–15.5)
WBC: 11.4 10*3/uL — ABNORMAL HIGH (ref 4.0–10.5)

## 2017-02-14 LAB — SALICYLATE LEVEL

## 2017-02-14 MED ORDER — GABAPENTIN 300 MG PO CAPS
600.0000 mg | ORAL_CAPSULE | Freq: Two times a day (BID) | ORAL | Status: DC
  Administered 2017-02-14 – 2017-02-15 (×3): 600 mg via ORAL
  Filled 2017-02-14 (×4): qty 2

## 2017-02-14 MED ORDER — FUROSEMIDE 40 MG PO TABS
60.0000 mg | ORAL_TABLET | Freq: Two times a day (BID) | ORAL | Status: DC
  Administered 2017-02-14 – 2017-02-15 (×2): 60 mg via ORAL
  Filled 2017-02-14 (×3): qty 1

## 2017-02-14 MED ORDER — HYDROCODONE-ACETAMINOPHEN 7.5-325 MG/15ML PO SOLN
15.0000 mL | Freq: Once | ORAL | Status: DC
Start: 2017-02-14 — End: 2017-02-14

## 2017-02-14 MED ORDER — CLONAZEPAM 0.5 MG PO TABS
0.5000 mg | ORAL_TABLET | Freq: Two times a day (BID) | ORAL | Status: DC | PRN
  Administered 2017-02-14 – 2017-02-15 (×2): 0.5 mg via ORAL
  Filled 2017-02-14 (×2): qty 1

## 2017-02-14 MED ORDER — METOPROLOL TARTRATE 25 MG PO TABS
25.0000 mg | ORAL_TABLET | Freq: Two times a day (BID) | ORAL | Status: DC
  Administered 2017-02-14: 25 mg
  Filled 2017-02-14: qty 1

## 2017-02-14 MED ORDER — CITALOPRAM HYDROBROMIDE 10 MG PO TABS
10.0000 mg | ORAL_TABLET | Freq: Every day | ORAL | Status: DC
  Administered 2017-02-14 – 2017-02-15 (×2): 10 mg via ORAL
  Filled 2017-02-14 (×2): qty 1

## 2017-02-14 MED ORDER — METOPROLOL TARTRATE 25 MG PO TABS
25.0000 mg | ORAL_TABLET | Freq: Two times a day (BID) | ORAL | Status: DC
  Administered 2017-02-15: 25 mg via ORAL
  Filled 2017-02-14: qty 1

## 2017-02-14 MED ORDER — CLONAZEPAM 0.5 MG PO TABS: 0.5000 mg | ORAL_TABLET | Freq: Three times a day (TID) | ORAL | Status: DC

## 2017-02-14 MED ORDER — RESOURCE THICKENUP CLEAR PO POWD
ORAL | Status: DC | PRN
  Filled 2017-02-14: qty 125

## 2017-02-14 MED ORDER — ATORVASTATIN CALCIUM 10 MG PO TABS
10.0000 mg | ORAL_TABLET | Freq: Every day | ORAL | Status: DC
  Administered 2017-02-14: 10 mg via ORAL
  Filled 2017-02-14: qty 1

## 2017-02-14 MED ORDER — FAMOTIDINE 20 MG PO TABS
20.0000 mg | ORAL_TABLET | Freq: Every day | ORAL | Status: DC
  Administered 2017-02-14 – 2017-02-15 (×2): 20 mg via ORAL
  Filled 2017-02-14 (×2): qty 1

## 2017-02-14 MED ORDER — LORAZEPAM 0.5 MG PO TABS
0.5000 mg | ORAL_TABLET | Freq: Two times a day (BID) | ORAL | Status: DC
  Administered 2017-02-14: 0.5 mg via ORAL
  Filled 2017-02-14: qty 1

## 2017-02-14 MED ORDER — TRAMADOL HCL 50 MG PO TABS
50.0000 mg | ORAL_TABLET | Freq: Two times a day (BID) | ORAL | Status: DC
  Administered 2017-02-14 – 2017-02-15 (×3): 50 mg via ORAL
  Filled 2017-02-14 (×3): qty 1

## 2017-02-14 MED ORDER — ASPIRIN EC 81 MG PO TBEC
81.0000 mg | DELAYED_RELEASE_TABLET | Freq: Every day | ORAL | Status: DC
  Administered 2017-02-14 – 2017-02-15 (×2): 81 mg via ORAL
  Filled 2017-02-14 (×2): qty 1

## 2017-02-14 MED ORDER — CARBIDOPA-LEVODOPA 25-100 MG PO TABS
1.0000 | ORAL_TABLET | Freq: Three times a day (TID) | ORAL | Status: DC
  Administered 2017-02-14 – 2017-02-15 (×3): 1 via ORAL
  Filled 2017-02-14 (×4): qty 1

## 2017-02-14 MED ORDER — OXYCODONE-ACETAMINOPHEN 5-325 MG PO TABS
1.0000 | ORAL_TABLET | Freq: Once | ORAL | Status: AC
  Administered 2017-02-14: 1 via ORAL
  Filled 2017-02-14: qty 1

## 2017-02-14 MED ORDER — ESCITALOPRAM OXALATE 10 MG PO TABS: 5.0000 mg | ORAL_TABLET | Freq: Every day | ORAL | Status: DC

## 2017-02-14 MED ORDER — PRIMIDONE 50 MG PO TABS
50.0000 mg | ORAL_TABLET | Freq: Two times a day (BID) | ORAL | Status: DC
  Administered 2017-02-14 – 2017-02-15 (×3): 50 mg via ORAL
  Filled 2017-02-14 (×3): qty 1

## 2017-02-14 NOTE — ED Notes (Signed)
Dr. Rhunette CroftNanavati informed of b/p.  Received verbal order to hold tonight's dose.

## 2017-02-14 NOTE — ED Notes (Signed)
Patient in room crying due to the pain in his feet.

## 2017-02-14 NOTE — ED Notes (Signed)
Patient reports placement of a feeding tube 5 weeks ago.  He states he eats and takes oral medications PO.  He states he has never used the feeding tube.

## 2017-02-14 NOTE — B[facility] Counselor (Addendum)
Clinician attempted to contact pt's nurse however she was in with another pt. Clinician expressed she will complete the pt's TTS assessment pt once pt is placed in a room updated discussed with Augusto GambleJody, Diplomatic Services operational officersecretary. Augusto GambleJody will discussed with pt's nurse.    Jack Pullingreylese D Erion Weightman, MS, [facility], [facility] Triage Specialist [phone]

## 2017-02-14 NOTE — ED Notes (Signed)
Daughter called stating "his tube hasn't been used since before he was d/c'd.  Dr. Wynonia LawmanHejazi wanted to keep it in to make sure everything was okay.  I don't know if you all have it documented but I have filed charges against [facility].  The bandage around the tube is supposed to be changed daily.  I went in there on 11/18 and it was dated 11/14."

## 2017-02-14 NOTE — BH Assessment (Addendum)
Assessment Note  Jack Carter is an 83 y.o. male, who [facility] Clinician asked the pt, "what brought you to the hospital?" Pt reported, yesterday about 5PM he was giving something to eat and drink. Pt reported, he was give a fork and a butter knife. Pt reported, he asked his daughter "is this what you want me to commit suicide with?" Pt reported, he was hearing and seeing things about a month or two ago. Pt denies, SI, HI, current AVH, self-injurious behaviors and abuse. Pt's UDS is pending.    Clinician contacted pt's daughter to gather additional information. Pt's daughter reported, the pt was living independently in [place]. Pt's daughter reported, the pt's neighbors in [place] called her because of the pt falling. Pt's daughter reported, she and her family went to visit the pt for the Fourth of July. Pt daughter reported, the pt moved with her to [place] at the end of August. Pt's daughter reported, the pt had a massive heart attack two days after closing on their new home. Pt reported, a new home was purchased to accommodate the pt. Pt's daughter reported, the pt was in ICU on a ventilator for two months. Pt daughter reported, the pt doctor told her the effects of her father being on a ventilator for so long. Pt's daughter reported, the pt was [facility]. Pt's daughter reported, the care at the SNF was sub par and the pt was brought home. Pt's daughter reported, the pt began to regress and needing assistance. Pt's daughter reported, the pt has been, "like Dr. Domenick GongJekyll and Mr. Sheppard PlumberHyde." Pt's daughter reported, she had a conversation with the pt that he would have to go to a nursing home. Pt's daughter reported, she took out IVC paperwork on the pt because he threatened to commit suicide.   Per IVC paperwork: "The respondent is hostile and aggressive towards family. The respondent constantly yells and argues with family members. The respondent reports suicidal  ideations but does not state a plan to carry out a suicide. The respondent went into the kitchen and took a butter knife but did not state what he was going to do with the knife. The respondent began laughing and stated that he had another way to kill himself. The respondent is a danger to himself."    Clinician was unable to assess the following: "OPT resources, legal involvement, education status, thought process, sleep, appetite, substance use, orientation."  Pt presents quiet/awake in scrubs with slurred, soft speech. Pt's eye contact was fair. Pt's mood was preoccupied. Pt's affect was congruent with mood. Pt's judgement was partial. Pt's daughter reported, if the pt is discharged she does not feel the pt could contact for safety.   Diagnosis: Major Vascular Neurocognitive Disorder.  Past Medical History:  Past Medical History:  Diagnosis Date  . Cellulitis   . Coronary artery disease    CABG  . Essential hypertension   . Heart attack (HCC)   . Hyperlipidemia   . Hypertension   . Parkinson's disease (HCC)   . Restless leg syndrome     Past Surgical History:  Procedure Laterality Date  . APPENDECTOMY    . CARDIAC SURGERY  2000  . CARPAL TUNNEL RELEASE Left   . CORONARY ARTERY BYPASS GRAFT    . IR GASTROSTOMY TUBE MOD SED  12/10/2016  . STOMACH SURGERY  2003   intestinal fissure    Family History:  Family History  Problem Relation Age of Onset  .  Heart disease Mother   . Cancer Mother   . Pneumonia Father     Social History:  reports that he has quit smoking. he has never used smokeless tobacco. He reports that he does not drink alcohol or use drugs.  Additional Social History:  Alcohol / Drug Use Pain Medications: See MAR Prescriptions: See MAR Over the Counter: See MAR History of alcohol / drug use?: (UDS is pending.)  CIWA: CIWA-Ar BP: (!) 101/57 Pulse Rate: (!) 53 COWS:    Allergies: No Known Allergies  Home Medications:  (Not in a hospital  admission)  OB/GYN Status:  No LMP for male patient.  General Assessment Data Assessment unable to be completed: Yes Reason for not completing assessment: Clinician attempted to conctact pt's nurse however she was in with another pt. Clinician expressed she will complete the pt's TTS assessment pt once pt is placed in a room updated discussed with Augusto Gamble, Diplomatic Services operational officer. Augusto Gamble will discussed with pt's nurse.  Location of Assessment: [facility] TTS Assessment: In system Is this a Tele or Face-to-Face Assessment?: Face-to-Face Is this an Initial Assessment or a Re-assessment for this encounter?: Initial Assessment Marital status: Divorced Is patient pregnant?: No Pregnancy Status: No Living Arrangements: Children Can pt return to current living arrangement?: (Pt's daughter wants the pt to go to a nursing facility. ) Admission Status: Involuntary Referral Source: Self/Family/Friend Insurance type: Medicare.      Crisis Care Plan Living Arrangements: Children Legal Guardian: Other relative(Paige Deneen Harts, daughter, [phone].) Name of Psychiatrist: UTA Name of Therapist: UTA  Education Status Is patient currently in school?: No Current Grade: NA Highest grade of school patient has completed: UTA Name of school: UTA Contact person: UTA  Risk to self with the past 6 months Suicidal Ideation: No-Not Currently/Within Last 6 Months(Per IVC however pt denies. ) Has patient been a risk to self within the past 6 months prior to admission? : No Suicidal Intent: No-Not Currently/Within Last 6 Months(Per IVC however pt denies. ) Has patient had any suicidal intent within the past 6 months prior to admission? : Other (comment)(UTA) Is patient at risk for suicide?: Yes Suicidal Plan?: No(Per IVC.) Has patient had any suicidal plan within the past 6 months prior to admission? : Other (comment)(UTA) Access to Means: Yes Specify Access to Suicidal Means: Pt has access to butter knives.  What has been  your use of drugs/alcohol within the last 12 months?: Pt's UDS is pending. Previous Attempts/Gestures: (UTA) How many times?: (UTA) Other Self Harm Risks: Pt denies.  Triggers for Past Attempts: Unknown Intentional Self Injurious Behavior: None(Pt denies. ) Family Suicide History: Unable to assess Recent stressful life event(s): Other (Comment)(Per daughter, pt going to a nursing home. ) Persecutory voices/beliefs?: Rich Reining) Depression: (UTA) Depression Symptoms: (UTA) Substance abuse history and/or treatment for substance abuse?: (UTA) Suicide prevention information given to non-admitted patients: Not applicable  Risk to Others within the past 6 months Homicidal Ideation: No(Pt denies. ) Does patient have any lifetime risk of violence toward others beyond the six months prior to admission? : No(Pt denies. ) Thoughts of Harm to Others: No Current Homicidal Intent: No Current Homicidal Plan: No Access to Homicidal Means: No Identified Victim: NA History of harm to others?: No Assessment of Violence: None Noted Violent Behavior Description: NA Does patient have access to weapons?: Yes (Comment)(butter knives. ) Criminal Charges Pending?: No Does patient have a court date: No Is patient on probation?: No  Psychosis Hallucinations: Auditory, Visual Delusions: None noted  Mental Status  Report Appearance/Hygiene: In scrubs Eye Contact: Fair Motor Activity: Unremarkable Speech: Slurred, Soft Level of Consciousness: Quiet/awake Mood: Preoccupied Affect: Other (Comment)(congruent with mood. ) Anxiety Level: None Thought Processes: Unable to Assess Judgement: Partial Orientation: Unable to assess Obsessive Compulsive Thoughts/Behaviors: Unable to Assess  Cognitive Functioning Concentration: Fair Memory: Unable to Assess IQ: Average Insight: Poor Impulse Control: Fair Appetite: (UTA) Sleep: Unable to Assess Vegetative Symptoms: Unable to Assess  ADLScreening [facility]  Assessment Services) Patient's cognitive ability adequate to safely complete daily activities?: Yes Patient able to express need for assistance with ADLs?: Yes Independently performs ADLs?: No  Prior Inpatient Therapy Prior Inpatient Therapy: (UTA) Prior Therapy Dates: UTA Prior Therapy Facilty/Provider(s): UTA Reason for Treatment: UTA  Prior Outpatient Therapy Prior Outpatient Therapy: (UTA) Prior Therapy Dates: UTA Prior Therapy Facilty/Provider(s): UTA Reason for Treatment: UTA Does patient have an ACCT team?: Unknown Does patient have Intensive In-House Services?  : Unknown Does patient have [facility] services? : Unknown Does patient have P4CC services?: Unknown  ADL Screening (condition at time of admission) Patient's cognitive ability adequate to safely complete daily activities?: Yes Is the patient deaf or have difficulty hearing?: Yes(Pt is head of hearing. ) Does the patient have difficulty seeing, even when wearing glasses/contacts?: (UTA) Does the patient have difficulty concentrating, remembering, or making decisions?: Yes Patient able to express need for assistance with ADLs?: Yes Does the patient have difficulty dressing or bathing?: (UTA) Independently performs ADLs?: No Communication: Independent Dressing (OT): (UTA) Grooming: (UTA) Feeding: (UTA) Bathing: (UTA) Toileting: Needs assistance Is this a change from baseline?: Pre-admission baseline In/Out Bed: (UTA) Walks in Home: Needs assistance Is this a change from baseline?: Pre-admission baseline Does the patient have difficulty walking or climbing stairs?: (UTA) Weakness of Legs: (UTA) Weakness of Arms/Hands: (UTA)  Home Assistive Devices/Equipment Home Assistive Devices/Equipment: Oxygen, Wheelchair    Abuse/Neglect Assessment (Assessment to be complete while patient is alone) Abuse/Neglect Assessment Can Be Completed: Yes Physical Abuse: Denies(Pt denies. ) Verbal Abuse: Denies(Pt denies.  ) Sexual Abuse: Denies(Pt denies. ) Exploitation of patient/patient's resources: Denies(Pt denies. ) Self-Neglect: Denies(Pt denies. )     Advance Directives (For Healthcare) Does Patient Have a Medical Advance Directive?: No Would patient like information on creating a medical advance directive?: No - Patient declined    Additional Information 1:1 In Past 12 Months?: No CIRT Risk: No Elopement Risk: No Does patient have medical clearance?: Yes     Disposition: Nira ConnJason Berry, NP recommends geropsychiatric inpatient treatment. Disposition discussed with Dr. Patria Maneampos and Everardo PacificKenisha, RN. TTS to seek placement.    Disposition Initial Assessment Completed for this Encounter: Yes Disposition of Patient: Inpatient treatment program Type of inpatient treatment program: Adult  On Site Evaluation by:  Jenny Reichmannreylese Kimika Streater, MS, LPC, CRC.  Reviewed with Physician: Dr. Patria Maneampos and Nira ConnJason Berry, NP.    Redmond Pullingreylese D Leannah Guse 02/14/2017 4:01 AM   Redmond Pullingreylese D Edilia Ghuman, MS, [facility], CRC Triage Specialist [phone]

## 2017-02-14 NOTE — ED Notes (Addendum)
Patient's daughter, Rhae Lerneraige Crutcher called to give history on this patient, she is patient's POA. Patient's daughter states that she moved her Father here this past July 4th from [place] due to frequent falls and inability to care for himself. Daughter states patient has dementia and parkinson's. Daughter states patient had an MI 10/31/16 and ended up in renal failure and was trached on a vent for 1 month. 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 Daughter states patient has had aspiration pneumonia multiple times. Daughter states the patient came home to her house last Friday and she has been unable to provide care for patient. Daughter states [facility] and she has been trying to coordinate patient to go to a [facility] facility. Daughter states patient became upset about returning to a SNF and became combative with family members-states she has a 81 year old at home that can't be exposed to this behavior. Daughter states she has been trying to work from home and care for her Father but this has not worked out. Daughter Rhae Lerneraige Crutcher POA contact is [phone]

## 2017-02-15 DIAGNOSIS — F4325 Adjustment disorder with mixed disturbance of emotions and conduct: Secondary | ICD-10-CM

## 2017-02-15 DIAGNOSIS — Z87891 Personal history of nicotine dependence: Secondary | ICD-10-CM | POA: Diagnosis not present

## 2017-02-15 DIAGNOSIS — R413 Other amnesia: Secondary | ICD-10-CM

## 2017-02-15 MED ORDER — CLONAZEPAM 0.5 MG PO TABS: 0.5000 mg | ORAL_TABLET | Freq: Two times a day (BID) | ORAL | 0 refills | Status: DC

## 2017-02-15 MED ORDER — GABAPENTIN 600 MG PO TABS: 600.0000 mg | ORAL_TABLET | Freq: Two times a day (BID) | ORAL | 0 refills | Status: DC

## 2017-02-15 MED ORDER — RISPERIDONE 0.5 MG PO TABS: 0.5000 mg | ORAL_TABLET | Freq: Two times a day (BID) | ORAL | 0 refills | Status: DC

## 2017-02-15 MED ORDER — ESCITALOPRAM OXALATE 5 MG PO TABS: 5.0000 mg | ORAL_TABLET | Freq: Every day | ORAL | 0 refills | Status: DC

## 2017-02-15 MED ORDER — RISPERIDONE 0.5 MG PO TABS
0.5000 mg | ORAL_TABLET | Freq: Two times a day (BID) | ORAL | Status: DC
  Administered 2017-02-15: 0.5 mg via ORAL
  Filled 2017-02-15: qty 1

## 2017-02-15 NOTE — [facility] Suicide Risk Assessment (Signed)
Suicide Risk Assessment  Discharge Assessment   [facility]   Principal Problem: Adjustment disorder with mixed disturbance of emotions and conduct Discharge Diagnoses:  Patient Active Problem List   Diagnosis Date Noted  . Adjustment disorder with mixed disturbance of emotions and conduct [F43.25] 02/14/2017    Priority: High  . Acute tracheostomy management (HCC) [Z43.0]   . Bradycardia, sinus [R00.1]   . Dyspnea [R06.00]   . Acute respiratory failure (HCC) [J96.00]   . Coronary artery disease involving native coronary artery of native heart without angina pectoris [I25.10]   . Acute on chronic combined systolic and diastolic CHF (congestive heart failure) (HCC) [I50.43]   . Great toe pain, unspecified laterality [M79.676]   . Aspiration into airway [T17.908A]   . Hypernatremia [E87.0]   . Elevated troponin [R74.8] 11/04/2016  . Acute encephalopathy [G93.40]   . Goals of care, counseling/discussion [Z71.89]   . Septic shock (HCC) [A41.9, R65.21] 10/31/2016  . Cellulitis [L03.90] 10/31/2016  . Hyperkalemia [E87.5] 10/31/2016  . AKI (acute kidney injury) (HCC) [N17.9] 10/31/2016  . Cardiogenic shock (HCC) [R57.0] 10/31/2016  . Acute hypoxemic respiratory failure (HCC) [J96.01] 10/31/2016  . CAD (coronary artery disease) of artery bypass graft [I25.810] 10/31/2016  . CHF (congestive heart failure) (HCC) [I50.9] 10/31/2016  . Parkinson disease (HCC) [G20] 10/31/2016  . Upper GI bleed [K92.2] 10/31/2016    Total Time spent with patient: 45 minutes  Musculoskeletal: Strength & Muscle Tone: within normal limits Gait & Station: unsteady Patient leans: Front  Psychiatric Specialty Exam: Physical Exam  Psychiatric: He has a normal mood and affect. His behavior is normal. Judgment and thought content normal. His speech is delayed. Cognition and memory are impaired.    Review of Systems  Constitutional: Negative.   HENT: Negative.   Eyes: Negative.    Respiratory: Negative.   Cardiovascular: Negative.   Gastrointestinal: Negative.   Genitourinary: Negative.   Skin: Negative.   Neurological: Negative.   Endo/Heme/Allergies: Negative.   Psychiatric/Behavioral: Positive for memory loss.    Blood pressure (!) 122/56, pulse 70, temperature 98 F (36.7 C), temperature source Oral, resp. rate 18, SpO2 96 %.There is no height or weight on file to calculate BMI.  General Appearance: Casual  Eye Contact:  Good  Speech:  Clear and Coherent and Slow  Volume:  Decreased  Mood:  Euthymic  Affect:  Constricted  Thought Process:  Disorganized  Orientation:  Other:  only to place and person  Thought Content:  Rumination  Suicidal Thoughts:  No  Homicidal Thoughts:  No  Memory:  Immediate;   Fair Recent;   Poor Remote;   Fair  Judgement:  Other:  marginal  Insight:  marginal  Psychomotor Activity:  Decreased  Concentration:  Concentration: Fair and Attention Span: Fair  Recall:  Fis[employer]Fair  Fund of Knowledge:  Fair  Language:  Good  Akathisia:  No  Handed:  Right  AIMS (if indicated):     Assets:  Communication Skills Housing Social Support Others:  family  ADL's:  Impaired  Cognition:  Impaired,  Mild  Sleep:   fair    Mental Status Per Nursing Assessment::   On Admission:   agitation   Demographic Factors:  Male, Age 83 or older and Caucasian  Loss Factors: NA  Historical Factors: Impulsivity  Risk Reduction Factors:   Sense of responsibility to family, Living with another person, especially a relative and Positive social support  Continued Clinical Symptoms:  None   Cognitive Features  That Contribute To Risk:  None    Suicide Risk:  Minimal: No identifiable suicidal ideation.  Patients presenting with no risk factors but with morbid ruminations; may be classified as minimal risk based on the severity of the depressive symptoms    Plan Of Care/Follow-up recommendations:  Activity:  as tolerated Diet:  heart  healthy diet  Rina Adney, NP 02/15/2017, 11:50 AM

## 2017-02-15 NOTE — [facility] Counselor (Signed)
Dr Jannifer FranklinAkintayo rescinded pt's IVC. Writer placed originals in [facility] IVC notebook and copy in pt's chart.   Evette Cristalaroline Paige Sergio [place], [place] Therapeutic Triage Specialist

## 2017-02-15 NOTE — Consult Note (Signed)
[facility] Consult   Reason for Consult: suicidal ideation and aggressive behavior Referring Physician:  EDP Patient Identification: Jack Carter MRN:  [id number] Principal Diagnosis: Adjustment disorder with mixed disturbance of emotions and conduct Diagnosis:   Patient Active Problem List   Diagnosis Date Noted  . Adjustment disorder with mixed disturbance of emotions and conduct [F43.25] 02/14/2017    Priority: High  . Acute tracheostomy management ([place]) [Z43.0]   . Bradycardia, sinus [R00.1]   . Dyspnea [R06.00]   . Acute respiratory failure ([place]) [J96.00]   . Coronary artery disease involving native coronary artery of native heart without angina pectoris [I25.10]   . Acute on chronic combined systolic and diastolic CHF (congestive heart failure) ([place]) [I50.43]   . Great toe pain, unspecified laterality [M79.676]   . Aspiration into airway [T17.908A]   . Hypernatremia [E87.0]   . Elevated troponin [R74.8] 11/04/2016  . Acute encephalopathy [G93.40]   . Goals of care, counseling/discussion [Z71.89]   . Septic shock ([place]) [A41.9, R65.21] 10/31/2016  . Cellulitis [L03.90] 10/31/2016  . Hyperkalemia [E87.5] 10/31/2016  . AKI (acute kidney injury) ([place]) [N17.9] 10/31/2016  . Cardiogenic shock ([place]) [R57.0] 10/31/2016  . Acute hypoxemic respiratory failure ([place]) [J96.01] 10/31/2016  . CAD (coronary artery disease) of artery bypass graft [I25.810] 10/31/2016  . CHF (congestive heart failure) ([place]) [I50.9] 10/31/2016  . Parkinson disease ([place]) [G20] 10/31/2016  . Upper GI bleed [K92.2] 10/31/2016    Total Time spent with patient: 45 minutes  Subjective:   Jack Carter is a 83 y.o. male patient admitted with aggression and risky behavior.  HPI: Patient with no prior history of Mental illness but has history of multiple medical problem. He was brought to [facility] under IVC obtained by his daughter due aggressive behavior towards family, hostility and  suicidal ideation. Patient reportedly grabbed a knife from the kitchen but did not do anything with it. Today, he denies delusions, psychosis, suicidal or homicidal ideations. He is calm, pleasant and requesting to be discharge because he felt like being in a prison at the hospital.   Past Psychiatric History: none reported  Risk to Self: Suicidal Ideation: Not Currently Suicidal Intent: Not Currently Is patient at risk for suicide?: none Suicidal Plan?: No(Per IVC.) Access to Means: denies Specify Access to Suicidal Means: denies  What has been your use of drugs/alcohol within the last 12 months?: pt denies drugs use. How many times?: (UTA) Other Self Harm Risks: Pt denies.  Triggers for Past Attempts: Unknown Intentional Self Injurious Behavior: None(Pt denies. ) Risk to Others: Homicidal Ideation: No(Pt denies. ) Thoughts of Harm to Others: No Current Homicidal Intent: No Current Homicidal Plan: No Access to Homicidal Means: No Identified Victim: NA History of harm to others?: No Assessment of Violence: None Noted Violent Behavior Description: NA Does patient have access to weapons?: Yes (Comment)(butter knives. ) Criminal Charges Pending?: No Does patient have a court date: No Prior Inpatient Therapy: Prior Inpatient Therapy: (UTA) Prior Therapy Dates: UTA Prior Therapy Facilty/Provider(s): UTA Reason for Treatment: UTA Prior Outpatient Therapy: Prior Outpatient Therapy: (UTA) Prior Therapy Dates: UTA Prior Therapy Facilty/Provider(s): UTA Reason for Treatment: UTA Does patient have an ACCT team?: Unknown Does patient have Intensive In-House Services?  : Unknown Does patient have [facility] services? : Unknown Does patient have P4CC services?: Unknown  Past Medical History:  Past Medical History:  Diagnosis Date  . Cellulitis   . Coronary artery disease    CABG  . Essential hypertension   .  Heart attack ([place])   . Hyperlipidemia   . Hypertension   . Parkinson's  disease ([place])   . Restless leg syndrome     Past Surgical History:  Procedure Laterality Date  . APPENDECTOMY    . CARDIAC SURGERY  2000  . CARPAL TUNNEL RELEASE Left   . CORONARY ARTERY BYPASS GRAFT    . IR GASTROSTOMY TUBE MOD SED  12/10/2016  . STOMACH SURGERY  2003   intestinal fissure   Family History:  Family History  Problem Relation Age of Onset  . Heart disease Mother   . Cancer Mother   . Pneumonia Father    Family Psychiatric  History:  Social History:  Social History   Substance and Sexual Activity  Alcohol Use No     Social History   Substance and Sexual Activity  Drug Use No    Social History   Socioeconomic History  . Marital status: Married    Spouse name: None  . Number of children: None  . Years of education: None  . Highest education level: None  Social Needs  . Financial resource strain: None  . Food insecurity - worry: None  . Food insecurity - inability: None  . Transportation needs - medical: None  . Transportation needs - non-medical: None  Occupational History  . None  Tobacco Use  . Smoking status: Former Research scientist (life sciences)  . Smokeless tobacco: Never Used  Substance and Sexual Activity  . Alcohol use: No  . Drug use: No  . Sexual activity: None  Other Topics Concern  . None  Social History Narrative   ** Merged History Encounter **       Additional Social History:    Allergies:  No Known Allergies  Labs:  Results for orders placed or performed during the hospital encounter of 02/13/17 (from the past 48 hour(s))  Comprehensive metabolic panel     Status: Abnormal   Collection Time: 02/14/17 12:13 AM  Result Value Ref Range   Sodium 138 135 - 145 mmol/L   Potassium 4.4 3.5 - 5.1 mmol/L   Chloride 93 (L) 101 - 111 mmol/L   CO2 35 (H) 22 - 32 mmol/L   Glucose, Bld 92 65 - 99 mg/dL   BUN 19 6 - 20 mg/dL   Creatinine, Ser 1.71 (H) 0.61 - 1.24 mg/dL   Calcium 9.0 8.9 - 10.3 mg/dL   Total Protein 8.4 (H) 6.5 - 8.1 g/dL   Albumin  3.1 (L) 3.5 - 5.0 g/dL   AST 52 (H) 15 - 41 U/L   ALT 9 (L) 17 - 63 U/L   Alkaline Phosphatase 133 (H) 38 - 126 U/L   Total Bilirubin 0.8 0.3 - 1.2 mg/dL   GFR calc non Af Amer 36 (L) >60 mL/min   GFR calc Af Amer 41 (L) >60 mL/min    Comment: (NOTE) The eGFR has been calculated using the CKD EPI equation. This calculation has not been validated in all clinical situations. eGFR's persistently <60 mL/min signify possible Chronic Kidney Disease.    Anion gap 10 5 - 15  Ethanol     Status: None   Collection Time: 02/14/17 12:13 AM  Result Value Ref Range   Alcohol, Ethyl (B) <10 <10 mg/dL    Comment:        LOWEST DETECTABLE LIMIT FOR SERUM ALCOHOL IS 10 mg/dL FOR MEDICAL PURPOSES ONLY   Salicylate level     Status: None   Collection Time: 02/14/17 12:13 AM  Result Value Ref Range   Salicylate Lvl <7.3 2.8 - 30.0 mg/dL  Acetaminophen level     Status: Abnormal   Collection Time: 02/14/17 12:13 AM  Result Value Ref Range   Acetaminophen (Tylenol), Serum <10 (L) 10 - 30 ug/mL    Comment:        THERAPEUTIC CONCENTRATIONS VARY SIGNIFICANTLY. A RANGE OF 10-30 ug/mL MAY BE AN EFFECTIVE CONCENTRATION FOR MANY PATIENTS. HOWEVER, SOME ARE BEST TREATED AT CONCENTRATIONS OUTSIDE THIS RANGE. ACETAMINOPHEN CONCENTRATIONS >150 ug/mL AT 4 HOURS AFTER INGESTION AND >50 ug/mL AT 12 HOURS AFTER INGESTION ARE OFTEN ASSOCIATED WITH TOXIC REACTIONS.   cbc     Status: Abnormal   Collection Time: 02/14/17 12:13 AM  Result Value Ref Range   WBC 11.4 (H) 4.0 - 10.5 K/uL   RBC 4.32 4.22 - 5.81 MIL/uL   Hemoglobin 13.6 13.0 - 17.0 g/dL   HCT 41.8 39.0 - 52.0 %   MCV 96.8 78.0 - 100.0 fL   MCH 31.5 26.0 - 34.0 pg   MCHC 32.5 30.0 - 36.0 g/dL   RDW 14.9 11.5 - 15.5 %   Platelets 249 150 - 400 K/uL  Rapid urine drug screen (hospital performed)     Status: Abnormal   Collection Time: 02/14/17  8:55 AM  Result Value Ref Range   Opiates NONE DETECTED NONE DETECTED   Cocaine NONE DETECTED  NONE DETECTED   Benzodiazepines NONE DETECTED NONE DETECTED   Amphetamines NONE DETECTED NONE DETECTED   Tetrahydrocannabinol NONE DETECTED NONE DETECTED   Barbiturates POSITIVE (A) NONE DETECTED    Comment:        DRUG SCREEN FOR MEDICAL PURPOSES ONLY.  IF CONFIRMATION IS NEEDED FOR ANY PURPOSE, NOTIFY LAB WITHIN 5 DAYS.        LOWEST DETECTABLE LIMITS FOR URINE DRUG SCREEN Drug Class       Cutoff (ng/mL) Amphetamine      1000 Barbiturate      200 Benzodiazepine   428 Tricyclics       768 Opiates          300 Cocaine          300 THC              50   Urinalysis, Routine w reflex microscopic     Status: Abnormal   Collection Time: 02/14/17  8:55 AM  Result Value Ref Range   Color, Urine YELLOW YELLOW   APPearance CLEAR CLEAR   Specific Gravity, Urine 1.012 1.005 - 1.030   pH 6.0 5.0 - 8.0   Glucose, UA NEGATIVE NEGATIVE mg/dL   Hgb urine dipstick MODERATE (A) NEGATIVE   Bilirubin Urine NEGATIVE NEGATIVE   Ketones, ur NEGATIVE NEGATIVE mg/dL   Protein, ur NEGATIVE NEGATIVE mg/dL   Nitrite NEGATIVE NEGATIVE   Leukocytes, UA LARGE (A) NEGATIVE   RBC / HPF 6-30 0 - 5 RBC/hpf   WBC, UA TOO NUMEROUS TO COUNT 0 - 5 WBC/hpf   Bacteria, UA RARE (A) NONE SEEN   Squamous Epithelial / LPF NONE SEEN NONE SEEN   Mucus PRESENT    Hyaline Casts, UA PRESENT    Amorphous Crystal PRESENT     Current Facility-Administered Medications  Medication Dose Route Frequency Provider Last Rate Last Dose  . acetaminophen (TYLENOL) tablet 650 mg  650 mg Oral Q4H PRN Jola Schmidt, MD   650 mg at 02/15/17 1157  . alum & mag hydroxide-simeth (MAALOX/MYLANTA) 200-200-20 MG/5ML suspension 30 mL  30 mL Oral Q6H  PRN Jola Schmidt, MD      . aspirin EC tablet 81 mg  81 mg Oral Daily Patrecia Pour, NP   81 mg at 02/15/17 3557  . atorvastatin (LIPITOR) tablet 10 mg  10 mg Oral QHS Patrecia Pour, NP   10 mg at 02/14/17 2255  . carbidopa-levodopa (SINEMET IR) 25-100 MG per tablet immediate release 1  tablet  1 tablet Oral TID Patrecia Pour, NP   1 tablet at 02/15/17 [phone]  . citalopram (CELEXA) tablet 10 mg  10 mg Oral Daily Kosisochukwu Goldberg, MD   10 mg at 02/15/17 0924  . clonazePAM (KLONOPIN) tablet 0.5 mg  0.5 mg Oral BID PRN Patrecia Pour, NP   0.5 mg at 02/15/17 2542  . famotidine (PEPCID) tablet 20 mg  20 mg Oral Daily Patrecia Pour, NP   20 mg at 02/15/17 7062  . furosemide (LASIX) tablet 60 mg  60 mg Oral BID Patrecia Pour, NP   60 mg at 02/15/17 3762  . gabapentin (NEURONTIN) capsule 600 mg  600 mg Oral BID Corena Pilgrim, MD   600 mg at 02/15/17 0923  . metoprolol tartrate (LOPRESSOR) tablet 25 mg  25 mg Oral BID Varney Biles, MD   25 mg at 02/15/17 0924  . ondansetron (ZOFRAN) tablet 4 mg  4 mg Oral Q8H PRN Jola Schmidt, MD      . primidone (MYSOLINE) tablet 50 mg  50 mg Oral Q12H Patrecia Pour, NP   50 mg at 02/15/17 8315  . [place]   Oral PRN Varney Biles, MD      . risperiDONE (RISPERDAL) tablet 0.5 mg  0.5 mg Oral BID Patrecia Pour, NP   0.5 mg at 02/15/17 1057  . traMADol (ULTRAM) tablet 50 mg  50 mg Oral BID Patrecia Pour, NP   50 mg at 02/15/17 1761   Current Outpatient Medications  Medication Sig Dispense Refill  . albuterol (PROVENTIL) (2.5 MG/3ML) 0.083% nebulizer solution Take 3 mLs (2.5 mg total) by nebulization every 2 (two) hours as needed for wheezing or shortness of breath. 75 mL 12  . alum & mag hydroxide-simeth (MYLANTA) 607-371-06 MG/5ML suspension Take 30 mLs by mouth every 6 (six) hours as needed for indigestion or heartburn.    Marland Kitchen aspirin EC 81 MG tablet Take 81 mg by mouth daily.    Marland Kitchen atorvastatin (LIPITOR) 10 MG tablet Take 1 tablet (10 mg total) by mouth at bedtime.    . carbidopa-levodopa (SINEMET IR) 25-100 MG tablet Take 1 tablet by mouth 3 (three) times daily.    . famotidine (PEPCID) 20 MG tablet Take 20 mg by mouth daily.    . fentaNYL (DURAGESIC - DOSED MCG/HR) 25 MCG/HR patch Place 1 patch (25 mcg total) onto  the skin every 3 (three) days. 5 patch 0  . furosemide (LASIX) 20 MG tablet Place 3 tablets (60 mg total) into feeding tube 2 (two) times daily. (Patient taking differently: Take 60 mg by mouth 2 (two) times daily. ) 30 tablet   . metoprolol tartrate (LOPRESSOR) 25 MG tablet Place 1 tablet (25 mg total) into feeding tube 2 (two) times daily.    . primidone (MYSOLINE) 50 MG tablet Take 1 tablet (50 mg total) by mouth every 12 (twelve) hours.    . traMADol (ULTRAM) 50 MG tablet Take 50 mg by mouth 2 (two) times daily. 1 tablet at 9am and 1 tablet at 5pm daily    .  clonazePAM (KLONOPIN) 0.5 MG tablet Take 1 tablet (0.5 mg total) by mouth 2 (two) times daily. Take 1 tablet at 9am, 2pm, and 9pm daily 30 tablet 0  . escitalopram (LEXAPRO) 5 MG tablet Take 1 tablet (5 mg total) by mouth daily. 30 tablet 0  . gabapentin (NEURONTIN) 600 MG tablet Take 1 tablet (600 mg total) by mouth 2 (two) times daily. 60 tablet 0  . risperiDONE (RISPERDAL) 0.5 MG tablet Take 1 tablet (0.5 mg total) by mouth 2 (two) times daily. 60 tablet 0    Musculoskeletal: Strength & Muscle Tone: within normal limits Gait & Station: unsteady Patient leans: Front  Psychiatric Specialty Exam: Physical Exam  Psychiatric: He has a normal mood and affect. His behavior is normal. Judgment and thought content normal. His speech is delayed. Cognition and memory are impaired.    Review of Systems  Constitutional: Negative.   HENT: Negative.   Eyes: Negative.   Respiratory: Negative.   Cardiovascular: Negative.   Gastrointestinal: Negative.   Genitourinary: Negative.   Skin: Negative.   Neurological: Negative.   Endo/Heme/Allergies: Negative.   Psychiatric/Behavioral: Positive for memory loss.    Blood pressure (!) 122/56, pulse 70, temperature 98 F (36.7 C), temperature source Oral, resp. rate 18, SpO2 96 %.There is no height or weight on file to calculate BMI.  General Appearance: Casual  Eye Contact:  Good  Speech:  Clear  and Coherent and Slow  Volume:  Decreased  Mood:  Euthymic  Affect:  Constricted  Thought Process:  Disorganized  Orientation:  Other:  only to place and person  Thought Content:  Rumination  Suicidal Thoughts:  No  Homicidal Thoughts:  No  Memory:  Immediate;   Fair Recent;   Poor Remote;   Fair  Judgement:  Other:  marginal  Insight:  marginal  Psychomotor Activity:  Decreased  Concentration:  Concentration: Fair and Attention Span: Fair  Recall:  [employer] of Knowledge:  Fair  Language:  Good  Akathisia:  No  Handed:  Right  AIMS (if indicated):     Assets:  Communication Skills Housing Social Support Others:  family  ADL's:  Impaired  Cognition:  Impaired,  Mild  Sleep:   fair     Treatment Plan Summary: Medication management: -Discontinue Lexapro -Start Celexa 10 mg daily for mood -Continue Home medications.  Disposition: No evidence of imminent risk to self or others at present.   Patient does not meet criteria for psychiatric inpatient admission.  Corena Pilgrim, MD 02/15/2017 11:17 AM

## 2017-02-15 NOTE — ED Notes (Signed)
Social worker on phone with daughter discussing discharge.

## 2017-02-15 NOTE — ED Notes (Signed)
Arrival clothes could not be located.

## 2017-02-15 NOTE — ED Notes (Signed)
Pt stating "I have 1 daughter.  I bought her a house for $355,000 and bought all new furniture for it too.  I also bought her a car.  Her and her husband want all the control.  I just didn't want to eat breakfast that morning.  I was in the [employer] for 5 years and worked in [place] for [employer].  I have a w/c at home.  Do you know if they're going to let me go home?  I feel like I'm in prison."
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
## 2017-02-18 ENCOUNTER — Emergency Department (HOSPITAL_BASED_OUTPATIENT_CLINIC_OR_DEPARTMENT_OTHER)
Admission: EM | Admit: 2017-02-18 | Discharge: 2017-02-18 | Disposition: A | Discharge status: Home / Self Care | Payer: Medicare Other | Attending: Emergency Medicine | Admitting: Emergency Medicine

## 2017-02-18 ENCOUNTER — Encounter (HOSPITAL_COMMUNITY): Payer: Self-pay

## 2017-02-18 ENCOUNTER — Emergency Department (HOSPITAL_BASED_OUTPATIENT_CLINIC_OR_DEPARTMENT_OTHER): Payer: Medicare Other

## 2017-02-18 ENCOUNTER — Encounter (HOSPITAL_BASED_OUTPATIENT_CLINIC_OR_DEPARTMENT_OTHER): Payer: Self-pay

## 2017-02-18 ENCOUNTER — Other Ambulatory Visit: Payer: Self-pay

## 2017-02-18 ENCOUNTER — Emergency Department (HOSPITAL_COMMUNITY): Payer: Medicare Other

## 2017-02-18 ENCOUNTER — Inpatient Hospital Stay (HOSPITAL_COMMUNITY)
Admission: EM | Admit: 2017-02-18 | Discharge: 2017-02-23 | DRG: 194 | Disposition: A | Discharge status: Skilled Nursing Facility | Payer: Medicare Other | Attending: Nephrology | Admitting: Nephrology

## 2017-02-18 DIAGNOSIS — Y999 Unspecified external cause status: Secondary | ICD-10-CM | POA: Insufficient documentation

## 2017-02-18 DIAGNOSIS — I252 Old myocardial infarction: Secondary | ICD-10-CM | POA: Diagnosis not present

## 2017-02-18 DIAGNOSIS — N3001 Acute cystitis with hematuria: Secondary | ICD-10-CM

## 2017-02-18 DIAGNOSIS — Z794 Long term (current) use of insulin: Secondary | ICD-10-CM

## 2017-02-18 DIAGNOSIS — F419 Anxiety disorder, unspecified: Secondary | ICD-10-CM | POA: Diagnosis present

## 2017-02-18 DIAGNOSIS — N183 Chronic kidney disease, stage 3 (moderate): Secondary | ICD-10-CM | POA: Diagnosis present

## 2017-02-18 DIAGNOSIS — S60512A Abrasion of left hand, initial encounter: Secondary | ICD-10-CM | POA: Insufficient documentation

## 2017-02-18 DIAGNOSIS — G20 Parkinson's disease: Secondary | ICD-10-CM | POA: Diagnosis present

## 2017-02-18 DIAGNOSIS — Z79891 Long term (current) use of opiate analgesic: Secondary | ICD-10-CM

## 2017-02-18 DIAGNOSIS — J181 Lobar pneumonia, unspecified organism: Secondary | ICD-10-CM | POA: Diagnosis not present

## 2017-02-18 DIAGNOSIS — N390 Urinary tract infection, site not specified: Secondary | ICD-10-CM | POA: Diagnosis not present

## 2017-02-18 DIAGNOSIS — G8929 Other chronic pain: Secondary | ICD-10-CM | POA: Diagnosis present

## 2017-02-18 DIAGNOSIS — F32A Depression, unspecified: Secondary | ICD-10-CM

## 2017-02-18 DIAGNOSIS — F4325 Adjustment disorder with mixed disturbance of emotions and conduct: Secondary | ICD-10-CM | POA: Diagnosis present

## 2017-02-18 DIAGNOSIS — Z87891 Personal history of nicotine dependence: Secondary | ICD-10-CM | POA: Diagnosis not present

## 2017-02-18 DIAGNOSIS — E785 Hyperlipidemia, unspecified: Secondary | ICD-10-CM | POA: Diagnosis present

## 2017-02-18 DIAGNOSIS — I5022 Chronic systolic (congestive) heart failure: Secondary | ICD-10-CM | POA: Diagnosis present

## 2017-02-18 DIAGNOSIS — Z79899 Other long term (current) drug therapy: Secondary | ICD-10-CM | POA: Insufficient documentation

## 2017-02-18 DIAGNOSIS — Z7982 Long term (current) use of aspirin: Secondary | ICD-10-CM

## 2017-02-18 DIAGNOSIS — I2510 Atherosclerotic heart disease of native coronary artery without angina pectoris: Secondary | ICD-10-CM | POA: Diagnosis present

## 2017-02-18 DIAGNOSIS — R45851 Suicidal ideations: Secondary | ICD-10-CM | POA: Diagnosis present

## 2017-02-18 DIAGNOSIS — I5042 Chronic combined systolic (congestive) and diastolic (congestive) heart failure: Secondary | ICD-10-CM | POA: Insufficient documentation

## 2017-02-18 DIAGNOSIS — R1310 Dysphagia, unspecified: Secondary | ICD-10-CM | POA: Diagnosis present

## 2017-02-18 DIAGNOSIS — G20A1 Parkinson's disease without dyskinesia, without mention of fluctuations: Secondary | ICD-10-CM | POA: Diagnosis present

## 2017-02-18 DIAGNOSIS — I110 Hypertensive heart disease with heart failure: Secondary | ICD-10-CM | POA: Insufficient documentation

## 2017-02-18 DIAGNOSIS — S0081XA Abrasion of other part of head, initial encounter: Secondary | ICD-10-CM | POA: Insufficient documentation

## 2017-02-18 DIAGNOSIS — Z951 Presence of aortocoronary bypass graft: Secondary | ICD-10-CM

## 2017-02-18 DIAGNOSIS — G2581 Restless legs syndrome: Secondary | ICD-10-CM | POA: Diagnosis present

## 2017-02-18 DIAGNOSIS — Y92018 Other place in single-family (private) house as the place of occurrence of the external cause: Secondary | ICD-10-CM | POA: Insufficient documentation

## 2017-02-18 DIAGNOSIS — B3749 Other urogenital candidiasis: Secondary | ICD-10-CM | POA: Diagnosis present

## 2017-02-18 DIAGNOSIS — S0001XA Abrasion of scalp, initial encounter: Secondary | ICD-10-CM

## 2017-02-18 DIAGNOSIS — Z931 Gastrostomy status: Secondary | ICD-10-CM | POA: Diagnosis not present

## 2017-02-18 DIAGNOSIS — Z9861 Coronary angioplasty status: Secondary | ICD-10-CM | POA: Insufficient documentation

## 2017-02-18 DIAGNOSIS — E873 Alkalosis: Secondary | ICD-10-CM | POA: Diagnosis present

## 2017-02-18 DIAGNOSIS — F329 Major depressive disorder, single episode, unspecified: Secondary | ICD-10-CM | POA: Diagnosis present

## 2017-02-18 DIAGNOSIS — J189 Pneumonia, unspecified organism: Principal | ICD-10-CM | POA: Diagnosis present

## 2017-02-18 DIAGNOSIS — I130 Hypertensive heart and chronic kidney disease with heart failure and stage 1 through stage 4 chronic kidney disease, or unspecified chronic kidney disease: Secondary | ICD-10-CM | POA: Diagnosis present

## 2017-02-18 DIAGNOSIS — Y9389 Activity, other specified: Secondary | ICD-10-CM | POA: Insufficient documentation

## 2017-02-18 DIAGNOSIS — W050XXA Fall from non-moving wheelchair, initial encounter: Secondary | ICD-10-CM | POA: Insufficient documentation

## 2017-02-18 DIAGNOSIS — R52 Pain, unspecified: Secondary | ICD-10-CM

## 2017-02-18 DIAGNOSIS — I259 Chronic ischemic heart disease, unspecified: Secondary | ICD-10-CM | POA: Insufficient documentation

## 2017-02-18 DIAGNOSIS — S61412A Laceration without foreign body of left hand, initial encounter: Secondary | ICD-10-CM

## 2017-02-18 LAB — RAPID URINE DRUG SCREEN, HOSP PERFORMED
Amphetamines: NOT DETECTED
BENZODIAZEPINES: NOT DETECTED
Barbiturates: POSITIVE — AB
COCAINE: NOT DETECTED
OPIATES: NOT DETECTED
TETRAHYDROCANNABINOL: NOT DETECTED

## 2017-02-18 LAB — I-STAT TROPONIN, ED: Troponin i, poc: 0.05 ng/mL (ref 0.00–0.08)

## 2017-02-18 LAB — COMPREHENSIVE METABOLIC PANEL
ALBUMIN: 2.8 g/dL — AB (ref 3.5–5.0)
ALT: 14 U/L — ABNORMAL LOW (ref 17–63)
ANION GAP: 8 (ref 5–15)
AST: 36 U/L (ref 15–41)
Alkaline Phosphatase: 133 U/L — ABNORMAL HIGH (ref 38–126)
BUN: 19 mg/dL (ref 6–20)
CHLORIDE: 94 mmol/L — AB (ref 101–111)
CO2: 36 mmol/L — AB (ref 22–32)
Calcium: 9.2 mg/dL (ref 8.9–10.3)
Creatinine, Ser: 1.61 mg/dL — ABNORMAL HIGH (ref 0.61–1.24)
GFR calc non Af Amer: 38 mL/min — ABNORMAL LOW (ref >60)
GFR, EST AFRICAN AMERICAN: 45 mL/min — AB (ref >60)
GLUCOSE: 110 mg/dL — AB (ref 65–99)
POTASSIUM: 4.4 mmol/L (ref 3.5–5.1)
SODIUM: 138 mmol/L (ref 135–145)
Total Bilirubin: 0.5 mg/dL (ref 0.3–1.2)
Total Protein: 7.7 g/dL (ref 6.5–8.1)

## 2017-02-18 LAB — URINALYSIS, ROUTINE W REFLEX MICROSCOPIC
Bilirubin Urine: NEGATIVE
GLUCOSE, UA: NEGATIVE mg/dL
KETONES UR: NEGATIVE mg/dL
Nitrite: NEGATIVE
PH: 6 (ref 5.0–8.0)
Protein, ur: NEGATIVE mg/dL
Specific Gravity, Urine: 1.015 (ref 1.005–1.030)

## 2017-02-18 LAB — CBC
HEMATOCRIT: 37.2 % — AB (ref 39.0–52.0)
HEMOGLOBIN: 12 g/dL — AB (ref 13.0–17.0)
MCH: 31.1 pg (ref 26.0–34.0)
MCHC: 32.3 g/dL (ref 30.0–36.0)
MCV: 96.4 fL (ref 78.0–100.0)
Platelets: 225 10*3/uL (ref 150–400)
RBC: 3.86 MIL/uL — AB (ref 4.22–5.81)
RDW: 14.8 % (ref 11.5–15.5)
WBC: 10.1 10*3/uL (ref 4.0–10.5)

## 2017-02-18 LAB — URINALYSIS, MICROSCOPIC (REFLEX): Squamous Epithelial / LPF: NONE SEEN

## 2017-02-18 LAB — ACETAMINOPHEN LEVEL

## 2017-02-18 LAB — SALICYLATE LEVEL

## 2017-02-18 LAB — ETHANOL: Alcohol, Ethyl (B): <10 mg/dL (ref <10)

## 2017-02-18 MED ORDER — DEXTROSE 5 % IV SOLN
1.0000 g | Freq: Once | INTRAVENOUS | Status: AC
  Administered 2017-02-18: 1 g via INTRAVENOUS
  Filled 2017-02-18: qty 10

## 2017-02-18 MED ORDER — AZITHROMYCIN 500 MG IV SOLR
500.0000 mg | Freq: Once | INTRAVENOUS | Status: AC
  Administered 2017-02-19: 500 mg via INTRAVENOUS
  Filled 2017-02-18: qty 500

## 2017-02-18 NOTE — ED Triage Notes (Signed)
States was at [employer] for fall out of wheelchair and went home via ambulance and was met by police with IVC paper from daughter states he keeps falling out of wheelchair on purpose with outbursts none noted while [facility] no outburst or trying to get out of bed.

## 2017-02-18 NOTE — ED Provider Notes (Signed)
[facility] EMERGENCY DEPARTMENT Provider Note   CSN: [id number] Arrival date & time: 02/18/17  1413     History   Chief Complaint Chief Complaint  Patient presents with  . Fall    HPI Jack Carter is a 83 y.o. male.  HPI Patient fell forward out of wheelchair or striking his head.  No loss of consciousness.  Sustained minor skin tear to the dorsum of the left hand.  Also has an abrasion to the right forehead.  He has had his baseline mental status.  Denies any neck pain.  Patient is chronically wheelchair-bound. Past Medical History:  Diagnosis Date  . Cellulitis   . Coronary artery disease    CABG  . Essential hypertension   . Heart attack (HCC)   . Hyperlipidemia   . Hypertension   . Parkinson's disease (HCC)   . Restless leg syndrome     Patient Active Problem List   Diagnosis Date Noted  . 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 Acute tracheostomy management (HCC)   . Bradycardia, sinus   . Dyspnea   . Acute respiratory failure (HCC)   . Coronary artery disease involving native coronary artery of native heart without angina pectoris   . Acute on chronic combined systolic and diastolic CHF (congestive heart failure) (HCC)   . Great toe pain, unspecified laterality   . Aspiration into airway   . Hypernatremia   . Elevated troponin 11/04/2016  . Acute encephalopathy   . Goals of care, counseling/discussion   . Septic shock (HCC) 10/31/2016  . Cellulitis 10/31/2016  . Hyperkalemia 10/31/2016  . AKI (acute kidney injury) (HCC) 10/31/2016  . Cardiogenic shock (HCC) 10/31/2016  . Acute hypoxemic respiratory failure (HCC) 10/31/2016  . CAD (coronary artery disease) of artery bypass graft 10/31/2016  . CHF (congestive heart failure) (HCC) 10/31/2016  . Parkinson disease (HCC) 10/31/2016  . Upper GI bleed 10/31/2016    Past Surgical History:  Procedure Laterality Date  . APPENDECTOMY    . CARDIAC  SURGERY  2000  . CARPAL TUNNEL RELEASE Left   . CORONARY ARTERY BYPASS GRAFT    . IR GASTROSTOMY TUBE MOD SED  12/10/2016  . STOMACH SURGERY  2003   intestinal fissure       Home Medications    Prior to Admission medications   Medication Sig Start Date End Date Taking? Authorizing Provider  albuterol (PROVENTIL) (2.5 MG/3ML) 0.083% nebulizer solution Take 3 mLs (2.5 mg total) by nebulization every 2 (two) hours as needed for wheezing or shortness of breath. 11/18/16   Desai, Rahul P, PA-C  alum & mag hydroxide-simeth (MYLANTA) 200-200-20 MG/5ML suspension Take 30 mLs by mouth every 6 (six) hours as needed for indigestion or heartburn.    [provider]  aspirin EC 81 MG tablet Take 81 mg by mouth daily.    [provider]  atorvastatin (LIPITOR) 10 MG tablet Take 1 tablet (10 mg total) by mouth at bedtime. 11/18/16   Desai, Rahul P, PA-C  carbidopa-levodopa (SINEMET IR) 25-100 MG tablet Take 1 tablet by mouth 3 (three) times daily. 11/18/16   Desai, Rahul P, PA-C  clonazePAM (KLONOPIN) 0.5 MG tablet Take 1 tablet (0.5 mg total) by mouth 2 (two) times daily. Take 1 tablet at 9am, 2pm, and 9pm daily 02/15/17   Charm RingsLord, Jamison Y, NP  escitalopram (LEXAPRO) 5 MG tablet Take 1 tablet (5 mg total) by mouth daily. 02/15/17   Charm RingsLord, Jamison Y,  NP  famotidine (PEPCID) 20 MG tablet Take 20 mg by mouth daily.    [provider]  fentaNYL (DURAGESIC - DOSED MCG/HR) 25 MCG/HR patch Place 1 patch (25 mcg total) onto the skin every 3 (three) days. 11/20/16   Desai, Rahul P, PA-C  furosemide (LASIX) 20 MG tablet Place 3 tablets (60 mg total) into feeding tube 2 (two) times daily. Patient taking differently: Take 60 mg by mouth 2 (two) times daily.  11/18/16   Desai, Rahul P, PA-C  gabapentin (NEURONTIN) 600 MG tablet Take 1 tablet (600 mg total) by mouth 2 (two) times daily. 02/15/17   Charm Rings, NP  metoprolol tartrate (LOPRESSOR) 25 MG tablet Place 1 tablet (25 mg total) into  feeding tube 2 (two) times daily. 11/18/16   Desai, Rahul P, PA-C  primidone (MYSOLINE) 50 MG tablet Take 1 tablet (50 mg total) by mouth every 12 (twelve) hours. 11/18/16   Desai, Rahul P, PA-C  risperiDONE (RISPERDAL) 0.5 MG tablet Take 1 tablet (0.5 mg total) by mouth 2 (two) times daily. 02/15/17   Charm Rings, NP  traMADol (ULTRAM) 50 MG tablet Take 50 mg by mouth 2 (two) times daily. 1 tablet at 9am and 1 tablet at 5pm daily    [provider]    Family History Family History  Problem Relation Age of Onset  . Heart disease Mother   . Cancer Mother   . Pneumonia Father     Social History Social History   Tobacco Use  . Smoking status: Former Games developer  . Smokeless tobacco: Never Used  Substance Use Topics  . Alcohol use: No  . Drug use: No     Allergies   Patient has no known allergies.   Review of Systems Review of Systems  Constitutional: Negative for chills and fever.  Eyes: Negative for visual disturbance.  Respiratory: Negative for cough and shortness of breath.   Cardiovascular: Negative for chest pain.  Gastrointestinal: Negative for abdominal pain, constipation, diarrhea, nausea and vomiting.  Genitourinary: Negative for dysuria, flank pain and hematuria.  Musculoskeletal: Negative for arthralgias, back pain, myalgias and neck pain.  Skin: Positive for wound. Negative for rash.  Neurological: Negative for dizziness, syncope, weakness, light-headedness, numbness and headaches.  All other systems reviewed and are negative.    Physical Exam Updated Vital Signs BP (!) 124/56 (BP Location: Right Arm)   Pulse 60   Temp (!) 97.5 F (36.4 C) (Oral)   Resp 18   Ht 5\' 9"  (1.753 m)   Wt 68 kg (150 lb)   SpO2 100%   BMI 22.15 kg/m   Physical Exam  Constitutional: He is oriented to person, place, and time. He appears well-developed and well-nourished. No distress.  HENT:  Head: Normocephalic.  Mouth/Throat: Oropharynx is clear and moist.  Minor  abrasion to the right forehead.  Mid face is stable.  No intraoral injury.  Eyes: EOM are normal. Pupils are equal, round, and reactive to light.  Neck: Normal range of motion. Neck supple.  No posterior midline cervical tenderness to palpation.  Cardiovascular: Normal rate and regular rhythm.  Pulmonary/Chest: Effort normal and breath sounds normal.  Abdominal: Soft. Bowel sounds are normal. There is no tenderness. There is no rebound and no guarding.  Musculoskeletal: Normal range of motion. He exhibits no edema or tenderness.  Neurological: He is alert and oriented to person, place, and time.  5/5 bilateral upper extremity strength.  3/5 bilateral lower extremity strength.  Sensation grossly  intact.  Skin: Skin is warm and dry. Capillary refill takes less than 2 seconds. No rash noted. No erythema.  Patient has superficial skin tear to the dorsum of the left hand.  No underlying bony tenderness to palpation.  Psychiatric: He has a normal mood and affect. His behavior is normal.  Nursing note and vitals reviewed.    ED Treatments / Results  Labs (all labs ordered are listed, but only abnormal results are displayed) Labs Reviewed - No data to display  EKG  EKG Interpretation None       Radiology Ct Head Wo Contrast  Result Date: 02/18/2017 CLINICAL DATA:  Fall from wheelchair today. Forehead abrasion. History of hypertension, hyperlipidemia and Parkinson's disease. EXAM: CT HEAD WITHOUT CONTRAST CT CERVICAL SPINE WITHOUT CONTRAST TECHNIQUE: Multidetector CT imaging of the head and cervical spine was performed following the standard protocol without intravenous contrast. Multiplanar CT image reconstructions of the cervical spine were also generated. COMPARISON:  CT HEAD November 28, 2016 FINDINGS: CT HEAD FINDINGS BRAIN: No intraparenchymal hemorrhage, mass effect nor midline shift. Moderate to severe parenchymal brain volume loss most notable in midbrain. No hydrocephalus. Patchy  supratentorial white matter hypodensities less than expected for patient's age, though non-specific are most compatible with chronic small vessel ischemic disease. No acute large vascular territory infarcts. No abnormal extra-axial fluid collections. Basal cisterns are patent. VASCULAR: Moderate calcific atherosclerosis of the carotid siphons. SKULL: No skull fracture. No significant scalp soft tissue swelling. SINUSES/ORBITS: Chronic RIGHT maxillary sinusitis with central calcifications seen with inspissation and fungal sinusitis. Severe LEFT sphenoid sinusitis. The included ocular globes and orbital contents are non-suspicious. OTHER: Poor dentition with multiple dental caries and periapical abscess. Mastoid air cells are well aerated. CT CERVICAL SPINE FINDINGS ALIGNMENT: Maintained lordosis. Grade 1 C7-T1 anterolisthesis on degenerative basis. SKULL BASE AND VERTEBRAE: Cervical vertebral bodies and posterior elements are intact. Multilevel severe disc height loss with endplate spurring and subchondral cyst compatible with degenerative discs. Moderate cervical facet arthropathy. C1-2 articulation maintained with mild arthropathy. Small amount of calcified pannus about the odontoid process seen with CPPD. SOFT TISSUES AND SPINAL CANAL: Nonacute. Moderate calcific atherosclerosis carotid bifurcations. Nuchal ligament calcifications. DISC LEVELS: Moderate to severe RIGHT C3-4 and RIGHT C5-6 neural foraminal narrowing. No canal stenosis. UPPER CHEST: Lung apices are clear. OTHER: None. IMPRESSION: CT HEAD: 1. No acute intracranial process. 2. Moderate to severe parenchymal brain volume loss, most conspicuous within midbrain associated with neurodegenerative disorders. 3. Mild chronic small vessel ischemic disease. CT CERVICAL SPINE: 1. No acute fracture or malalignment. 2. Moderate to severe RIGHT C3-4 and C5-6 neural foraminal narrowing. Electronically Signed   By: Awilda Metro M.D.   On: 02/18/2017 15:02    Ct Cervical Spine Wo Contrast  Result Date: 02/18/2017 CLINICAL DATA:  Fall from wheelchair today. Forehead abrasion. History of hypertension, hyperlipidemia and Parkinson's disease. EXAM: CT HEAD WITHOUT CONTRAST CT CERVICAL SPINE WITHOUT CONTRAST TECHNIQUE: Multidetector CT imaging of the head and cervical spine was performed following the standard protocol without intravenous contrast. Multiplanar CT image reconstructions of the cervical spine were also generated. COMPARISON:  CT HEAD November 28, 2016 FINDINGS: CT HEAD FINDINGS BRAIN: No intraparenchymal hemorrhage, mass effect nor midline shift. Moderate to severe parenchymal brain volume loss most notable in midbrain. No hydrocephalus. Patchy supratentorial white matter hypodensities less than expected for patient's age, though non-specific are most compatible with chronic small vessel ischemic disease. No acute large vascular territory infarcts. No abnormal extra-axial fluid collections. Basal cisterns are patent. VASCULAR:  Moderate calcific atherosclerosis of the carotid siphons. SKULL: No skull fracture. No significant scalp soft tissue swelling. SINUSES/ORBITS: Chronic RIGHT maxillary sinusitis with central calcifications seen with inspissation and fungal sinusitis. Severe LEFT sphenoid sinusitis. The included ocular globes and orbital contents are non-suspicious. OTHER: Poor dentition with multiple dental caries and periapical abscess. Mastoid air cells are well aerated. CT CERVICAL SPINE FINDINGS ALIGNMENT: Maintained lordosis. Grade 1 C7-T1 anterolisthesis on degenerative basis. SKULL BASE AND VERTEBRAE: Cervical vertebral bodies and posterior elements are intact. Multilevel severe disc height loss with endplate spurring and subchondral cyst compatible with degenerative discs. Moderate cervical facet arthropathy. C1-2 articulation maintained with mild arthropathy. Small amount of calcified pannus about the odontoid process seen with CPPD. SOFT  TISSUES AND SPINAL CANAL: Nonacute. Moderate calcific atherosclerosis carotid bifurcations. Nuchal ligament calcifications. DISC LEVELS: Moderate to severe RIGHT C3-4 and RIGHT C5-6 neural foraminal narrowing. No canal stenosis. UPPER CHEST: Lung apices are clear. OTHER: None. IMPRESSION: CT HEAD: 1. No acute intracranial process. 2. Moderate to severe parenchymal brain volume loss, most conspicuous within midbrain associated with neurodegenerative disorders. 3. Mild chronic small vessel ischemic disease. CT CERVICAL SPINE: 1. No acute fracture or malalignment. 2. Moderate to severe RIGHT C3-4 and C5-6 neural foraminal narrowing. Electronically Signed   By: Awilda Metroourtnay  Bloomer M.D.   On: 02/18/2017 15:02    Procedures Procedures (including critical care time)  Medications Ordered in ED Medications - No data to display   Initial Impression / Assessment and Plan / ED Course  I have reviewed the triage vital signs and the nursing notes.  Pertinent labs & imaging results that were available during my care of the patient were reviewed by me and considered in my medical decision making (see chart for details).     No acute findings on CT head or cervical spine.  Neurologic exam is baseline for the patient.  Wound care to the left hand skin tear.  Discharge back to nursing home.  Follow-up with primary physician.  Return precautions given.  Final Clinical Impressions(s) / ED Diagnoses   Final diagnoses:  Abrasion of scalp, initial encounter  Skin tear of left hand without complication, initial encounter    ED Discharge Orders    None       Loren RacerYelverton, Shaolin Armas, MD 02/18/17 1513

## 2017-02-18 NOTE — ED Notes (Signed)
pts family states they are on the way home.  They stated they would be home in 20 minutes.

## 2017-02-18 NOTE — ED Notes (Signed)
[facility] here for transport

## 2017-02-18 NOTE — ED Notes (Signed)
Daughter returned phone call states [place] lives [place] , and that she is [place] the court [place] taking commitment papers out on him, Charge nurse made aware

## 2017-02-18 NOTE — ED Notes (Signed)
No respiratory or acute distress noted alert and oriented x 3 call light in reach hospital sitter at bedside.

## 2017-02-18 NOTE — ED Notes (Signed)
Attempt return  call to daughter , no answer

## 2017-02-18 NOTE — ED Notes (Signed)
[facility] called for transport, attempt x 2 to call report to NH, voice mail only

## 2017-02-18 NOTE — ED Triage Notes (Signed)
Pt was in his wheelchair and fell forward out of it. Pt has an abrasion on the right side of his forehead. Pt reports pain in his left wrist and hand. Pt has no other complaints but family wanted the pt evaluated.

## 2017-02-18 NOTE — ED Provider Notes (Signed)
[facility]-EMERGENCY DEPT Provider Note   CSN: [id number] Arrival date & time: 02/18/17  1932     History   Chief Complaint Chief Complaint  Patient presents with  . Medical Clearance   Level V Caveat due to altered mental status HPI Jack Carter is a 83 y.o. male with history of Parkinson's disease, HTN, HLD, CAD, chronic combined systolic and diastolic CHF presents to the ED under IVC for psychiatric evaluation.  Per IVC paperwork and expressing suicidal ideation taken out by the patient's daughter, patient has been purposely falling out of his wheelchair and expressing suicidal ideation.  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  Paperwork states that he has been aggressive to family members at home.  He fell out of his wheelchair earlier today.  He was seen at [facility] and evaluated and found to be stable for discharge with only a skin tear to the dorsum of the left hand and a superficial skin abrasion to the right side of the forehead.  Patient was placed under IVC by daughter on February 13, 2017 for similar reasons. Of note, patient's urine on February 14, 2017 appears consistent with UTI, however patient was not started on antibiotics.  On February 15, 2017 patient was evaluated by a psychiatrist who recommended discontinuing Lexapro, initiating Celexa 10 mg, and found patient not to meet criteria for psychiatric inpatient admission.  While in the ED today, patient answer some questions appropriately but is not oriented to place or time.  He complains of chest pain but is unable to provide any additional information.  The history is provided by the patient. The history is limited by the condition of the patient.    Past Medical History:  Diagnosis Date  . Cellulitis   . Coronary artery disease    CABG  . Essential hypertension   . Heart attack (HCC)   . Hyperlipidemia   . Hypertension   . Parkinson's disease (HCC)   .  Restless leg syndrome     Patient Active Problem List   Diagnosis Date Noted  . CAP (community acquired pneumonia) 02/18/2017  . 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 Acute tracheostomy management (HCC)   . Bradycardia, sinus   . Dyspnea   . Acute respiratory failure (HCC)   . Coronary artery disease involving native coronary artery of native heart without angina pectoris   . Acute on chronic combined systolic and diastolic CHF (congestive heart failure) (HCC)   . Great toe pain, unspecified laterality   . Aspiration into airway   . Hypernatremia   . Elevated troponin 11/04/2016  . Acute encephalopathy   . Goals of care, counseling/discussion   . Septic shock (HCC) 10/31/2016  . Cellulitis 10/31/2016  . Hyperkalemia 10/31/2016  . AKI (acute kidney injury) (HCC) 10/31/2016  . Cardiogenic shock (HCC) 10/31/2016  . Acute hypoxemic respiratory failure (HCC) 10/31/2016  . CAD (coronary artery disease) of artery bypass graft 10/31/2016  . CHF (congestive heart failure) (HCC) 10/31/2016  . Parkinson disease (HCC) 10/31/2016  . Upper GI bleed 10/31/2016    Past Surgical History:  Procedure Laterality Date  . APPENDECTOMY    . CARDIAC SURGERY  2000  . CARPAL TUNNEL RELEASE Left   . CORONARY ARTERY BYPASS GRAFT    . IR GASTROSTOMY TUBE MOD SED  12/10/2016  . STOMACH SURGERY  2003   intestinal fissure       Home Medications  Prior to Admission medications   Medication Sig Start Date End Date Taking? Authorizing Provider  alum & mag hydroxide-simeth (MYLANTA) 200-200-20 MG/5ML suspension Take 30 mLs by mouth every 6 (six) hours as needed for indigestion or heartburn.   Yes [provider]  aspirin EC 81 MG tablet Take 81 mg by mouth daily.   Yes [provider]  atorvastatin (LIPITOR) 10 MG tablet Take 1 tablet (10 mg total) by mouth at bedtime. 11/18/16  Yes Desai, Rahul P, PA-C  carbidopa-levodopa (SINEMET IR)  25-100 MG tablet Take 1 tablet by mouth 3 (three) times daily. 11/18/16  Yes Desai, Rahul P, PA-C  cephALEXin (KEFLEX) 500 MG capsule Take 500 mg by mouth 4 (four) times daily.   Yes [provider]  clonazePAM (KLONOPIN) 0.5 MG tablet Take 1 tablet (0.5 mg total) by mouth 2 (two) times daily. Take 1 tablet at 9am, 2pm, and 9pm daily 02/15/17  Yes Lord, Herminio Heads, NP  escitalopram (LEXAPRO) 5 MG tablet Take 1 tablet (5 mg total) by mouth daily. 02/15/17  Yes Charm Rings, NP  famotidine (PEPCID) 20 MG tablet Take 20 mg by mouth daily.   Yes [provider]  fentaNYL (DURAGESIC - DOSED MCG/HR) 25 MCG/HR patch Place 1 patch (25 mcg total) onto the skin every 3 (three) days. 11/20/16  Yes Desai, Rahul P, PA-C  furosemide (LASIX) 20 MG tablet Place 3 tablets (60 mg total) into feeding tube 2 (two) times daily. 11/18/16  Yes Desai, Rahul P, PA-C  gabapentin (NEURONTIN) 600 MG tablet Take 1 tablet (600 mg total) by mouth 2 (two) times daily. 02/15/17  Yes Charm Rings, NP  metoprolol tartrate (LOPRESSOR) 25 MG tablet Place 1 tablet (25 mg total) into feeding tube 2 (two) times daily. 11/18/16  Yes Desai, Rahul P, PA-C  primidone (MYSOLINE) 50 MG tablet Take 1 tablet (50 mg total) by mouth every 12 (twelve) hours. 11/18/16  Yes Desai, Rahul P, PA-C  traMADol (ULTRAM) 50 MG tablet Take 50 mg by mouth 2 (two) times daily. 1 tablet at 9am and 1 tablet at 5pm daily   Yes [provider]  albuterol (PROVENTIL) (2.5 MG/3ML) 0.083% nebulizer solution Take 3 mLs (2.5 mg total) by nebulization every 2 (two) hours as needed for wheezing or shortness of breath. Patient not taking: Reported on 02/18/2017 11/18/16   Rutherford Guys P, PA-C  risperiDONE (RISPERDAL) 0.5 MG tablet Take 1 tablet (0.5 mg total) by mouth 2 (two) times daily. Patient not taking: Reported on 02/18/2017 02/15/17   Charm Rings, NP    Family History Family History  Problem Relation Age of Onset  . Heart disease  Mother   . Cancer Mother   . Pneumonia Father     Social History Social History   Tobacco Use  . Smoking status: Former Games developer  . Smokeless tobacco: Never Used  Substance Use Topics  . Alcohol use: No  . Drug use: No     Allergies   Patient has no known allergies.   Review of Systems Review of Systems  Unable to perform ROS: Mental status change     Physical Exam Updated Vital Signs BP 120/60 (BP Location: Left Arm)   Pulse 71   Temp 98.5 F (36.9 C) (Oral)   Resp 18   Ht 5\' 9"  (1.753 m)   Wt 68 kg (150 lb)   SpO2 100%   BMI 22.15 kg/m   Physical Exam  Constitutional: He appears well-developed and well-nourished.  No distress.  HENT:  Head: Normocephalic and atraumatic.  Eyes: Conjunctivae and EOM are normal. Pupils are equal, round, and reactive to light. Right eye exhibits no discharge. Left eye exhibits no discharge.  Neck: No JVD present. No tracheal deviation present.  Cardiovascular: Normal rate and regular rhythm.  Pulmonary/Chest: Effort normal and breath sounds normal. He exhibits no tenderness.  Abdominal: Soft. Bowel sounds are normal. He exhibits no distension. There is no tenderness.  Musculoskeletal: He exhibits no edema.  No midline spine TTP, no paraspinal muscle tenderness, no deformity, crepitus, or step-off noted.   Neurological: He is alert.  Alert but oriented to person only.  No facial droop.  Speech is somewhat dysarthric.  Bilateral upper extremities are tremulous at rest and with movement.  Good  grip strength bilaterally and good strength with plantarflexion of the bilateral feet.  Unable to assess gait and patient is wheelchair-bound at baseline.  He answers some questions appropriately but not others.  Skin: Skin is warm and dry. No erythema.  Superficial skin tear to the dorsum of the left hand, abrasions noted to the forehead with no underlying crepitus, deformity, or tenderness  Psychiatric: He has a normal mood and affect. His  behavior is normal.  Nursing note and vitals reviewed.    ED Treatments / Results  Labs (all labs ordered are listed, but only abnormal results are displayed) Labs Reviewed  COMPREHENSIVE METABOLIC PANEL - Abnormal; Notable for the following components:      Result Value   Chloride 94 (*)    CO2 36 (*)    Glucose, Bld 110 (*)    Creatinine, Ser 1.61 (*)    Albumin 2.8 (*)    ALT 14 (*)    Alkaline Phosphatase 133 (*)    GFR calc non Af Amer 38 (*)    GFR calc Af Amer 45 (*)    All other components within normal limits  ACETAMINOPHEN LEVEL - Abnormal; Notable for the following components:   Acetaminophen (Tylenol), Serum <10 (*)    All other components within normal limits  CBC - Abnormal; Notable for the following components:   RBC 3.86 (*)    Hemoglobin 12.0 (*)    HCT 37.2 (*)    All other components within normal limits  RAPID URINE DRUG SCREEN, HOSP PERFORMED - Abnormal; Notable for the following components:   Barbiturates POSITIVE (*)    All other components within normal limits  URINALYSIS, ROUTINE W REFLEX MICROSCOPIC - Abnormal; Notable for the following components:   APPearance HAZY (*)    Hgb urine dipstick LARGE (*)    Leukocytes, UA SMALL (*)    All other components within normal limits  URINALYSIS, MICROSCOPIC (REFLEX) - Abnormal; Notable for the following components:   Bacteria, UA RARE (*)    All other components within normal limits  URINE CULTURE  ETHANOL  SALICYLATE LEVEL  I-STAT TROPONIN, ED    EKG  EKG Interpretation  Date/Time:  Wednesday February 18 2017 22:13:14 EST Ventricular Rate:  69 PR Interval:    QRS Duration: 111 QT Interval:  393 QTC Calculation: 421 R Axis:   70 Text Interpretation:  Sinus rhythm RSR' in V1 or V2, right VCD or RVH Probable inferior infarct, old No significant change since last tracing Confirmed by Shaune PollackIsaacs, Cameron [phone]([id number]) on 02/18/2017 10:32:22 PM       Radiology Dg Chest 2 View  Result Date:  02/18/2017 CLINICAL DATA:  Shortness of breath and chest pain EXAM: CHEST  2 VIEW COMPARISON:  November 29, 2016 FINDINGS: There is focal airspace consolidation in the left base. The lungs elsewhere clear. Heart is borderline enlarged with pulmonary vascularity within normal limits. No adenopathy. There is aortic atherosclerosis. There is degenerative change in the thoracic spine and each shoulder. There are surgical clips at the gastroesophageal junction. Patient is status post coronary artery bypass grafting. IMPRESSION: Airspace opacity consistent with pneumonia left base. Lungs elsewhere clear. Stable cardiac silhouette. There is aortic atherosclerosis. Areas of postoperative change noted. Aortic Atherosclerosis (ICD10-I70.0). Electronically Signed   By: Bretta Bang III M.D.   On: 02/18/2017 21:52   Ct Head Wo Contrast  Result Date: 02/18/2017 CLINICAL DATA:  Fall from wheelchair today. Forehead abrasion. History of hypertension, hyperlipidemia and Parkinson's disease. EXAM: CT HEAD WITHOUT CONTRAST CT CERVICAL SPINE WITHOUT CONTRAST TECHNIQUE: Multidetector CT imaging of the head and cervical spine was performed following the standard protocol without intravenous contrast. Multiplanar CT image reconstructions of the cervical spine were also generated. COMPARISON:  CT HEAD November 28, 2016 FINDINGS: CT HEAD FINDINGS BRAIN: No intraparenchymal hemorrhage, mass effect nor midline shift. Moderate to severe parenchymal brain volume loss most notable in midbrain. No hydrocephalus. Patchy supratentorial white matter hypodensities less than expected for patient's age, though non-specific are most compatible with chronic small vessel ischemic disease. No acute large vascular territory infarcts. No abnormal extra-axial fluid collections. Basal cisterns are patent. VASCULAR: Moderate calcific atherosclerosis of the carotid siphons. SKULL: No skull fracture. No significant scalp soft tissue swelling.  SINUSES/ORBITS: Chronic RIGHT maxillary sinusitis with central calcifications seen with inspissation and fungal sinusitis. Severe LEFT sphenoid sinusitis. The included ocular globes and orbital contents are non-suspicious. OTHER: Poor dentition with multiple dental caries and periapical abscess. Mastoid air cells are well aerated. CT CERVICAL SPINE FINDINGS ALIGNMENT: Maintained lordosis. Grade 1 C7-T1 anterolisthesis on degenerative basis. SKULL BASE AND VERTEBRAE: Cervical vertebral bodies and posterior elements are intact. Multilevel severe disc height loss with endplate spurring and subchondral cyst compatible with degenerative discs. Moderate cervical facet arthropathy. C1-2 articulation maintained with mild arthropathy. Small amount of calcified pannus about the odontoid process seen with CPPD. SOFT TISSUES AND SPINAL CANAL: Nonacute. Moderate calcific atherosclerosis carotid bifurcations. Nuchal ligament calcifications. DISC LEVELS: Moderate to severe RIGHT C3-4 and RIGHT C5-6 neural foraminal narrowing. No canal stenosis. UPPER CHEST: Lung apices are clear. OTHER: None. IMPRESSION: CT HEAD: 1. No acute intracranial process. 2. Moderate to severe parenchymal brain volume loss, most conspicuous within midbrain associated with neurodegenerative disorders. 3. Mild chronic small vessel ischemic disease. CT CERVICAL SPINE: 1. No acute fracture or malalignment. 2. Moderate to severe RIGHT C3-4 and C5-6 neural foraminal narrowing. Electronically Signed   By: Awilda Metro M.D.   On: 02/18/2017 15:02   Ct Cervical Spine Wo Contrast  Result Date: 02/18/2017 CLINICAL DATA:  Fall from wheelchair today. Forehead abrasion. History of hypertension, hyperlipidemia and Parkinson's disease. EXAM: CT HEAD WITHOUT CONTRAST CT CERVICAL SPINE WITHOUT CONTRAST TECHNIQUE: Multidetector CT imaging of the head and cervical spine was performed following the standard protocol without intravenous contrast. Multiplanar CT image  reconstructions of the cervical spine were also generated. COMPARISON:  CT HEAD November 28, 2016 FINDINGS: CT HEAD FINDINGS BRAIN: No intraparenchymal hemorrhage, mass effect nor midline shift. Moderate to severe parenchymal brain volume loss most notable in midbrain. No hydrocephalus. Patchy supratentorial white matter hypodensities less than expected for patient's age, though non-specific are most compatible with chronic small vessel ischemic disease. No acute large vascular territory infarcts. No abnormal  extra-axial fluid collections. Basal cisterns are patent. VASCULAR: Moderate calcific atherosclerosis of the carotid siphons. SKULL: No skull fracture. No significant scalp soft tissue swelling. SINUSES/ORBITS: Chronic RIGHT maxillary sinusitis with central calcifications seen with inspissation and fungal sinusitis. Severe LEFT sphenoid sinusitis. The included ocular globes and orbital contents are non-suspicious. OTHER: Poor dentition with multiple dental caries and periapical abscess. Mastoid air cells are well aerated. CT CERVICAL SPINE FINDINGS ALIGNMENT: Maintained lordosis. Grade 1 C7-T1 anterolisthesis on degenerative basis. SKULL BASE AND VERTEBRAE: Cervical vertebral bodies and posterior elements are intact. Multilevel severe disc height loss with endplate spurring and subchondral cyst compatible with degenerative discs. Moderate cervical facet arthropathy. C1-2 articulation maintained with mild arthropathy. Small amount of calcified pannus about the odontoid process seen with CPPD. SOFT TISSUES AND SPINAL CANAL: Nonacute. Moderate calcific atherosclerosis carotid bifurcations. Nuchal ligament calcifications. DISC LEVELS: Moderate to severe RIGHT C3-4 and RIGHT C5-6 neural foraminal narrowing. No canal stenosis. UPPER CHEST: Lung apices are clear. OTHER: None. IMPRESSION: CT HEAD: 1. No acute intracranial process. 2. Moderate to severe parenchymal brain volume loss, most conspicuous within midbrain  associated with neurodegenerative disorders. 3. Mild chronic small vessel ischemic disease. CT CERVICAL SPINE: 1. No acute fracture or malalignment. 2. Moderate to severe RIGHT C3-4 and C5-6 neural foraminal narrowing. Electronically Signed   By: Awilda Metro M.D.   On: 02/18/2017 15:02    Procedures Procedures (including critical care time)  Medications Ordered in ED Medications  azithromycin (ZITHROMAX) 500 mg in dextrose 5 % 250 mL IVPB (not administered)  cefTRIAXone (ROCEPHIN) 1 g in dextrose 5 % 50 mL IVPB (0 g Intravenous Stopped 02/19/17 0016)     Initial Impression / Assessment and Plan / ED Course  I have reviewed the triage vital signs and the nursing notes.  Pertinent labs & imaging results that were available during my care of the patient were reviewed by me and considered in my medical decision making (see chart for details).     Patient presents under IVC for suicidal ideation and increased aggression.  Afebrile, vital signs are stable.  He is not aggressive while in the ED today.  He denies suicidal ideation or homicidal ideation.  He was seen and evaluated earlier today secondary to fall out of his wheelchair which his daughter claims was on purpose in an attempt to hurt himself.  Imaging at that time was on concerning.  He appears confused and does not answer most questions appropriately but follows commands without difficulty.  He does complain of chest pain here today.  EKG shows no significant change from last tracing and troponin is normal.  I doubt ACS or MI contributing to his symptoms today.  However, chest x-ray  consistent with pneumonia in the left lower lobe.  UA performed 4 days ago consistent with UTI, however patient never received treatment for this.  UA today still consistent with UTI.  Culture sent.  Will start on Rocephin and azithromycin.  I suspect his pneumonia and UTI may be contributing to his increased aggression and behavior change. Spoke with Dr.  Lars Masson with hospitalist service who agrees to assume care of patient and bring him into the hospital for further management and evaluation.  He will receive psychiatric evaluation while in the hospital.  Patient seen and evaluated by Dr. Erma Heritage who agrees with assessment and plan at this time.  Final Clinical Impressions(s) / ED Diagnoses   Final diagnoses:  Community acquired pneumonia of left lower lobe of lung (HCC)  Acute  cystitis with hematuria    ED Discharge Orders    None       Bennye Alm 02/19/17 1610    Shaune Pollack, MD 02/19/17 [phone]

## 2017-02-18 NOTE — ED Notes (Signed)
Attempt to call NH again no answer

## 2017-02-19 ENCOUNTER — Other Ambulatory Visit: Payer: Self-pay

## 2017-02-19 ENCOUNTER — Telehealth: Payer: Self-pay | Admitting: Neurology

## 2017-02-19 ENCOUNTER — Encounter (HOSPITAL_COMMUNITY): Payer: Self-pay | Admitting: Internal Medicine

## 2017-02-19 DIAGNOSIS — N390 Urinary tract infection, site not specified: Secondary | ICD-10-CM

## 2017-02-19 DIAGNOSIS — Z87891 Personal history of nicotine dependence: Secondary | ICD-10-CM

## 2017-02-19 DIAGNOSIS — J189 Pneumonia, unspecified organism: Secondary | ICD-10-CM | POA: Diagnosis present

## 2017-02-19 LAB — TROPONIN I
Troponin I: 0.08 ng/mL (ref <0.03)
Troponin I: 0.08 ng/mL (ref <0.03)
Troponin I: 0.08 ng/mL (ref <0.03)

## 2017-02-19 LAB — CBC
HEMATOCRIT: 32.2 % — AB (ref 39.0–52.0)
Hemoglobin: 10.5 g/dL — ABNORMAL LOW (ref 13.0–17.0)
MCH: 31.2 pg (ref 26.0–34.0)
MCHC: 32.6 g/dL (ref 30.0–36.0)
MCV: 95.5 fL (ref 78.0–100.0)
PLATELETS: 219 10*3/uL (ref 150–400)
RBC: 3.37 MIL/uL — ABNORMAL LOW (ref 4.22–5.81)
RDW: 15 % (ref 11.5–15.5)
WBC: 10.4 10*3/uL (ref 4.0–10.5)

## 2017-02-19 LAB — BASIC METABOLIC PANEL
Anion gap: 8 (ref 5–15)
BUN: 20 mg/dL (ref 6–20)
CHLORIDE: 92 mmol/L — AB (ref 101–111)
CO2: 34 mmol/L — AB (ref 22–32)
CREATININE: 1.55 mg/dL — AB (ref 0.61–1.24)
Calcium: 8.4 mg/dL — ABNORMAL LOW (ref 8.9–10.3)
GFR calc Af Amer: 47 mL/min — ABNORMAL LOW (ref >60)
GFR calc non Af Amer: 40 mL/min — ABNORMAL LOW (ref >60)
GLUCOSE: 90 mg/dL (ref 65–99)
Potassium: 4.3 mmol/L (ref 3.5–5.1)
SODIUM: 134 mmol/L — AB (ref 135–145)

## 2017-02-19 LAB — TSH: TSH: 2.775 u[IU]/mL (ref 0.350–4.500)

## 2017-02-19 LAB — STREP PNEUMONIAE URINARY ANTIGEN: Strep Pneumo Urinary Antigen: NEGATIVE

## 2017-02-19 LAB — HIV ANTIBODY (ROUTINE TESTING W REFLEX): HIV Screen 4th Generation wRfx: NONREACTIVE

## 2017-02-19 MED ORDER — FAMOTIDINE 20 MG PO TABS
20.0000 mg | ORAL_TABLET | Freq: Every day | ORAL | Status: DC
  Administered 2017-02-19 – 2017-02-23 (×5): 20 mg via ORAL
  Filled 2017-02-19 (×5): qty 1

## 2017-02-19 MED ORDER — FENTANYL 25 MCG/HR TD PT72
25.0000 ug | MEDICATED_PATCH | TRANSDERMAL | Status: DC
  Administered 2017-02-21: 25 ug via TRANSDERMAL
  Filled 2017-02-19: qty 1

## 2017-02-19 MED ORDER — ATORVASTATIN CALCIUM 10 MG PO TABS
10.0000 mg | ORAL_TABLET | Freq: Every day | ORAL | Status: DC
  Administered 2017-02-19 – 2017-02-22 (×4): 10 mg via ORAL
  Filled 2017-02-19 (×4): qty 1

## 2017-02-19 MED ORDER — CLONAZEPAM 0.5 MG PO TABS
0.5000 mg | ORAL_TABLET | Freq: Two times a day (BID) | ORAL | Status: DC
  Administered 2017-02-19: 0.5 mg via ORAL
  Filled 2017-02-19: qty 1

## 2017-02-19 MED ORDER — ONDANSETRON HCL 4 MG/2ML IJ SOLN: 4.0000 mg | Freq: Four times a day (QID) | INTRAMUSCULAR | Status: DC | PRN

## 2017-02-19 MED ORDER — METOPROLOL TARTRATE 25 MG PO TABS
25.0000 mg | ORAL_TABLET | Freq: Two times a day (BID) | ORAL | Status: DC
  Administered 2017-02-19 (×2): 25 mg
  Filled 2017-02-19 (×2): qty 1

## 2017-02-19 MED ORDER — DEXTROSE 5 % IV SOLN
500.0000 mg | INTRAVENOUS | Status: AC
  Administered 2017-02-19 – 2017-02-21 (×3): 500 mg via INTRAVENOUS
  Filled 2017-02-19 (×4): qty 500

## 2017-02-19 MED ORDER — GABAPENTIN 300 MG PO CAPS
600.0000 mg | ORAL_CAPSULE | Freq: Two times a day (BID) | ORAL | Status: DC
  Administered 2017-02-19 – 2017-02-23 (×9): 600 mg via ORAL
  Filled 2017-02-19 (×9): qty 2

## 2017-02-19 MED ORDER — ACETAMINOPHEN 325 MG PO TABS
650.0000 mg | ORAL_TABLET | Freq: Four times a day (QID) | ORAL | Status: DC | PRN
  Administered 2017-02-19 – 2017-02-23 (×10): 650 mg via ORAL
  Filled 2017-02-19 (×11): qty 2

## 2017-02-19 MED ORDER — ASPIRIN EC 81 MG PO TBEC
81.0000 mg | DELAYED_RELEASE_TABLET | Freq: Every day | ORAL | Status: DC
  Administered 2017-02-19 – 2017-02-23 (×5): 81 mg via ORAL
  Filled 2017-02-19 (×5): qty 1

## 2017-02-19 MED ORDER — ACETAMINOPHEN 650 MG RE SUPP: 650.0000 mg | Freq: Four times a day (QID) | RECTAL | Status: DC | PRN

## 2017-02-19 MED ORDER — CLONAZEPAM 0.5 MG PO TABS
0.5000 mg | ORAL_TABLET | Freq: Three times a day (TID) | ORAL | Status: DC | PRN
  Administered 2017-02-19: 0.5 mg via ORAL
  Filled 2017-02-19: qty 1

## 2017-02-19 MED ORDER — ACETAMINOPHEN 325 MG PO TABS
650.0000 mg | ORAL_TABLET | Freq: Once | ORAL | Status: AC
  Administered 2017-02-19: 650 mg via ORAL
  Filled 2017-02-19: qty 2

## 2017-02-19 MED ORDER — ONDANSETRON HCL 4 MG PO TABS: 4.0000 mg | ORAL_TABLET | Freq: Four times a day (QID) | ORAL | Status: DC | PRN

## 2017-02-19 MED ORDER — DEXTROSE 5 % IV SOLN
2.0000 g | INTRAVENOUS | Status: AC
  Administered 2017-02-19 – 2017-02-22 (×4): 2 g via INTRAVENOUS
  Filled 2017-02-19 (×4): qty 2

## 2017-02-19 MED ORDER — ESCITALOPRAM OXALATE 10 MG PO TABS
5.0000 mg | ORAL_TABLET | Freq: Every day | ORAL | Status: DC
  Administered 2017-02-19: 5 mg via ORAL
  Filled 2017-02-19: qty 1

## 2017-02-19 MED ORDER — ENOXAPARIN SODIUM 40 MG/0.4ML ~~LOC~~ SOLN
40.0000 mg | SUBCUTANEOUS | Status: DC
  Administered 2017-02-19 – 2017-02-22 (×4): 40 mg via SUBCUTANEOUS
  Filled 2017-02-19 (×4): qty 0.4

## 2017-02-19 MED ORDER — FUROSEMIDE 20 MG PO TABS
60.0000 mg | ORAL_TABLET | Freq: Two times a day (BID) | ORAL | Status: DC
  Administered 2017-02-19 – 2017-02-20 (×3): 60 mg
  Filled 2017-02-19 (×3): qty 3

## 2017-02-19 MED ORDER — CITALOPRAM HYDROBROMIDE 20 MG PO TABS
10.0000 mg | ORAL_TABLET | Freq: Every day | ORAL | Status: DC
  Administered 2017-02-20 – 2017-02-23 (×4): 10 mg via ORAL
  Filled 2017-02-19 (×4): qty 1

## 2017-02-19 MED ORDER — PRIMIDONE 50 MG PO TABS
50.0000 mg | ORAL_TABLET | Freq: Two times a day (BID) | ORAL | Status: DC
  Administered 2017-02-19 – 2017-02-23 (×9): 50 mg via ORAL
  Filled 2017-02-19 (×9): qty 1

## 2017-02-19 MED ORDER — CARBIDOPA-LEVODOPA 25-100 MG PO TABS
1.0000 | ORAL_TABLET | Freq: Three times a day (TID) | ORAL | Status: DC
  Administered 2017-02-19 – 2017-02-23 (×13): 1 via ORAL
  Filled 2017-02-19 (×14): qty 1

## 2017-02-19 MED ORDER — TRAMADOL HCL 50 MG PO TABS
50.0000 mg | ORAL_TABLET | Freq: Two times a day (BID) | ORAL | Status: DC
  Administered 2017-02-19 – 2017-02-23 (×9): 50 mg via ORAL
  Filled 2017-02-19 (×9): qty 1

## 2017-02-19 NOTE — ED Notes (Signed)
Date and time results received: 02/19/17 0548 (use smartphrase ".now" to insert current time)  Test: Troponin Critical Value: 0.08  Name of Provider Notified: Toniann FailKakrakandy  Orders Received? Or Actions Taken?: Actions Taken: none

## 2017-02-19 NOTE — H&P (Signed)
History and Physical    Jack Carter:[id number] DOB: 03/23/1933 DOA: 02/18/2017  PCP: Patient, No Pcp Per  Patient coming from: Home.  Chief Complaint: Frequent falls.  HPI: Jack Carter is a 83 y.o. male with history of CAD status post CABG, systolic heart failure, chronic kidney disease, Parkinson's disease and recently admitted in September 2018 for respiratory failure secondary to aspiration and CHF was brought to the ER after patient has been having frequent falls and also possible suicidal thoughts as per the daughter who discussed with the ER physician.  This is the second visit in the same day for the same complaints.  I am unable to reach patient started to give more history.  Patient states that he suddenly fell off the wheelchair hit his head but denies losing consciousness.  As per the daughter will give a history to the ER physician patient has been following deliberately.  They are also concerned that patient was throwing some knife around.  ED Course: The second visit patient's workup shows possible pneumonia and UTI.  CT head and C-spine was unremarkable for anything acute.  Patient is not in distress and was started on antibiotics for pneumonia and UTI.  Patient is placed on suicide precautions with sitter.  Review of Systems: As per HPI, rest all negative.   Past Medical History:  Diagnosis Date  . Cellulitis   . Coronary artery disease    CABG  . Essential hypertension   . Heart attack (HCC)   . Hyperlipidemia   . Hypertension   . Parkinson's disease (HCC)   . Restless leg syndrome     Past Surgical History:  Procedure Laterality Date  . APPENDECTOMY    . CARDIAC SURGERY  2000  . CARPAL TUNNEL RELEASE Left   . CORONARY ARTERY BYPASS GRAFT    . IR GASTROSTOMY TUBE MOD SED  12/10/2016  . STOMACH SURGERY  2003   intestinal fissure     reports that he has quit smoking. he has never used smokeless tobacco. He reports that he does not drink  alcohol or use drugs.  No Known Allergies  Family History  Problem Relation Age of Onset  . Heart disease Mother   . Cancer Mother   . Pneumonia Father     Prior to Admission medications   Medication Sig Start Date End Date Taking? Authorizing Provider  alum & mag hydroxide-simeth (MYLANTA) 200-200-20 MG/5ML suspension Take 30 mLs by mouth every 6 (six) hours as needed for indigestion or heartburn.   Yes [provider]  aspirin EC 81 MG tablet Take 81 mg by mouth daily.   Yes [provider]  atorvastatin (LIPITOR) 10 MG tablet Take 1 tablet (10 mg total) by mouth at bedtime. 11/18/16  Yes Desai, Rahul P, PA-C  carbidopa-levodopa (SINEMET IR) 25-100 MG tablet Take 1 tablet by mouth 3 (three) times daily. 11/18/16  Yes Desai, Rahul P, PA-C  cephALEXin (KEFLEX) 500 MG capsule Take 500 mg by mouth 4 (four) times daily.   Yes [provider]  clonazePAM (KLONOPIN) 0.5 MG tablet Take 1 tablet (0.5 mg total) by mouth 2 (two) times daily. Take 1 tablet at 9am, 2pm, and 9pm daily 02/15/17  Yes Lord, Herminio Heads, NP  escitalopram (LEXAPRO) 5 MG tablet Take 1 tablet (5 mg total) by mouth daily. 02/15/17  Yes Charm Rings, NP  famotidine (PEPCID) 20 MG tablet Take 20 mg by mouth daily.   Yes [provider]  fentaNYL (  DURAGESIC - DOSED MCG/HR) 25 MCG/HR patch Place 1 patch (25 mcg total) onto the skin every 3 (three) days. 11/20/16  Yes Desai, Rahul P, PA-C  furosemide (LASIX) 20 MG tablet Place 3 tablets (60 mg total) into feeding tube 2 (two) times daily. 11/18/16  Yes Desai, Rahul P, PA-C  gabapentin (NEURONTIN) 600 MG tablet Take 1 tablet (600 mg total) by mouth 2 (two) times daily. 02/15/17  Yes Charm Rings, NP  metoprolol tartrate (LOPRESSOR) 25 MG tablet Place 1 tablet (25 mg total) into feeding tube 2 (two) times daily. 11/18/16  Yes Desai, Rahul P, PA-C  primidone (MYSOLINE) 50 MG tablet Take 1 tablet (50 mg total) by mouth every 12 (twelve) hours. 11/18/16   Yes Desai, Rahul P, PA-C  traMADol (ULTRAM) 50 MG tablet Take 50 mg by mouth 2 (two) times daily. 1 tablet at 9am and 1 tablet at 5pm daily   Yes [provider]  albuterol (PROVENTIL) (2.5 MG/3ML) 0.083% nebulizer solution Take 3 mLs (2.5 mg total) by nebulization every 2 (two) hours as needed for wheezing or shortness of breath. Patient not taking: Reported on 02/18/2017 11/18/16   Rutherford Guys P, PA-C  risperiDONE (RISPERDAL) 0.5 MG tablet Take 1 tablet (0.5 mg total) by mouth 2 (two) times daily. Patient not taking: Reported on 02/18/2017 02/15/17   Charm Rings, NP    Physical Exam: Vitals:   02/18/17 1952 02/18/17 2000 02/19/17 0000  BP:  120/60 120/63  Pulse:  71 81  Resp:  18 18  Temp:  98.5 F (36.9 C) 99 F (37.2 C)  TempSrc:  Oral Oral  SpO2:  100% 100%  Weight: 68 kg (150 lb)    Height: 5\' 9"  (1.753 m)        Constitutional: Moderately built and nourished. Vitals:   02/18/17 1952 02/18/17 2000 02/19/17 0000  BP:  120/60 120/63  Pulse:  71 81  Resp:  18 18  Temp:  98.5 F (36.9 C) 99 F (37.2 C)  TempSrc:  Oral Oral  SpO2:  100% 100%  Weight: 68 kg (150 lb)    Height: 5\' 9"  (1.753 m)     Eyes: Anicteric no pallor. ENMT: No discharge from the ears eyes nose or mouth. Neck: No mass felt.  No JVD appreciated. Respiratory: No rhonchi or crepitations. Cardiovascular: S1-S2 heard no murmurs appreciated. Abdomen: Soft nontender bowel sounds present. Musculoskeletal: No edema.  No joint effusion. Skin: No rash.  Skin appears warm. Neurologic: Alert awake oriented to place and person.  Moves all extremities 5 x 5. Psychiatric: Mildly confused but has no had any aggressive behavior in the ER.  Has not clearly stated any suicidal or homicidal.   Labs on Admission: I have personally reviewed following labs and imaging studies  CBC: Recent Labs  Lab 02/14/17 0013 02/18/17 2110  WBC 11.4* 10.1  HGB 13.6 12.0*  HCT 41.8 37.2*  MCV 96.8 96.4  PLT  249 225   Basic Metabolic Panel: Recent Labs  Lab 02/14/17 0013 02/18/17 2110  NA 138 138  K 4.4 4.4  CL 93* 94*  CO2 35* 36*  GLUCOSE 92 110*  BUN 19 19  CREATININE 1.71* 1.61*  CALCIUM 9.0 9.2   GFR: Estimated Creatinine Clearance: 34.6 mL/min (A) (by C-G formula based on SCr of 1.61 mg/dL (H)). Liver Function Tests: Recent Labs  Lab 02/14/17 0013 02/18/17 2110  AST 52* 36  ALT 9* 14*  ALKPHOS 133* 133*  BILITOT 0.8 0.5  PROT 8.4* 7.7  ALBUMIN 3.1* 2.8*   No results for input(s): LIPASE, AMYLASE in the last 168 hours. No results for input(s): AMMONIA in the last 168 hours. Coagulation Profile: No results for input(s): INR, PROTIME in the last 168 hours. Cardiac Enzymes: No results for input(s): CKTOTAL, CKMB, CKMBINDEX, TROPONINI in the last 168 hours. BNP (last 3 results) No results for input(s): PROBNP in the last 8760 hours. HbA1C: No results for input(s): HGBA1C in the last 72 hours. CBG: No results for input(s): GLUCAP in the last 168 hours. Lipid Profile: No results for input(s): CHOL, HDL, LDLCALC, TRIG, CHOLHDL, LDLDIRECT in the last 72 hours. Thyroid Function Tests: No results for input(s): TSH, T4TOTAL, FREET4, T3FREE, THYROIDAB in the last 72 hours. Anemia Panel: No results for input(s): VITAMINB12, FOLATE, FERRITIN, TIBC, IRON, RETICCTPCT in the last 72 hours. Urine analysis:    Component Value Date/Time   COLORURINE YELLOW 02/18/2017 2038   APPEARANCEUR HAZY (A) 02/18/2017 2038   LABSPEC 1.015 02/18/2017 2038   PHURINE 6.0 02/18/2017 2038   GLUCOSEU NEGATIVE 02/18/2017 2038   HGBUR LARGE (A) 02/18/2017 2038   BILIRUBINUR NEGATIVE 02/18/2017 2038   KETONESUR NEGATIVE 02/18/2017 2038   PROTEINUR NEGATIVE 02/18/2017 2038   NITRITE NEGATIVE 02/18/2017 2038   LEUKOCYTESUR SMALL (A) 02/18/2017 2038   Sepsis Labs: @LABRCNTIP (procalcitonin:4,lacticidven:4) )No results found for this or any previous visit (from the past 240 hour(s)).    Radiological Exams on Admission: Dg Chest 2 View  Result Date: 02/18/2017 CLINICAL DATA:  Shortness of breath and chest pain EXAM: CHEST  2 VIEW COMPARISON:  November 29, 2016 FINDINGS: There is focal airspace consolidation in the left base. The lungs elsewhere clear. Heart is borderline enlarged with pulmonary vascularity within normal limits. No adenopathy. There is aortic atherosclerosis. There is degenerative change in the thoracic spine and each shoulder. There are surgical clips at the gastroesophageal junction. Patient is status post coronary artery bypass grafting. IMPRESSION: Airspace opacity consistent with pneumonia left base. Lungs elsewhere clear. Stable cardiac silhouette. There is aortic atherosclerosis. Areas of postoperative change noted. Aortic Atherosclerosis (ICD10-I70.0). Electronically Signed   By: Bretta BangWilliam  Woodruff III M.D.   On: 02/18/2017 21:52   Ct Head Wo Contrast  Result Date: 02/18/2017 CLINICAL DATA:  Fall from wheelchair today. Forehead abrasion. History of hypertension, hyperlipidemia and Parkinson's disease. EXAM: CT HEAD WITHOUT CONTRAST CT CERVICAL SPINE WITHOUT CONTRAST TECHNIQUE: Multidetector CT imaging of the head and cervical spine was performed following the standard protocol without intravenous contrast. Multiplanar CT image reconstructions of the cervical spine were also generated. COMPARISON:  CT HEAD November 28, 2016 FINDINGS: CT HEAD FINDINGS BRAIN: No intraparenchymal hemorrhage, mass effect nor midline shift. Moderate to severe parenchymal brain volume loss most notable in midbrain. No hydrocephalus. Patchy supratentorial white matter hypodensities less than expected for patient's age, though non-specific are most compatible with chronic small vessel ischemic disease. No acute large vascular territory infarcts. No abnormal extra-axial fluid collections. Basal cisterns are patent. VASCULAR: Moderate calcific atherosclerosis of the carotid siphons. SKULL:  No skull fracture. No significant scalp soft tissue swelling. SINUSES/ORBITS: Chronic RIGHT maxillary sinusitis with central calcifications seen with inspissation and fungal sinusitis. Severe LEFT sphenoid sinusitis. The included ocular globes and orbital contents are non-suspicious. OTHER: Poor dentition with multiple dental caries and periapical abscess. Mastoid air cells are well aerated. CT CERVICAL SPINE FINDINGS ALIGNMENT: Maintained lordosis. Grade 1 C7-T1 anterolisthesis on degenerative basis. SKULL BASE AND VERTEBRAE: Cervical vertebral bodies and posterior elements are intact. Multilevel severe disc  height loss with endplate spurring and subchondral cyst compatible with degenerative discs. Moderate cervical facet arthropathy. C1-2 articulation maintained with mild arthropathy. Small amount of calcified pannus about the odontoid process seen with CPPD. SOFT TISSUES AND SPINAL CANAL: Nonacute. Moderate calcific atherosclerosis carotid bifurcations. Nuchal ligament calcifications. DISC LEVELS: Moderate to severe RIGHT C3-4 and RIGHT C5-6 neural foraminal narrowing. No canal stenosis. UPPER CHEST: Lung apices are clear. OTHER: None. IMPRESSION: CT HEAD: 1. No acute intracranial process. 2. Moderate to severe parenchymal brain volume loss, most conspicuous within midbrain associated with neurodegenerative disorders. 3. Mild chronic small vessel ischemic disease. CT CERVICAL SPINE: 1. No acute fracture or malalignment. 2. Moderate to severe RIGHT C3-4 and C5-6 neural foraminal narrowing. Electronically Signed   By: Awilda Metroourtnay  Bloomer M.D.   On: 02/18/2017 15:02   Ct Cervical Spine Wo Contrast  Result Date: 02/18/2017 CLINICAL DATA:  Fall from wheelchair today. Forehead abrasion. History of hypertension, hyperlipidemia and Parkinson's disease. EXAM: CT HEAD WITHOUT CONTRAST CT CERVICAL SPINE WITHOUT CONTRAST TECHNIQUE: Multidetector CT imaging of the head and cervical spine was performed following the  standard protocol without intravenous contrast. Multiplanar CT image reconstructions of the cervical spine were also generated. COMPARISON:  CT HEAD November 28, 2016 FINDINGS: CT HEAD FINDINGS BRAIN: No intraparenchymal hemorrhage, mass effect nor midline shift. Moderate to severe parenchymal brain volume loss most notable in midbrain. No hydrocephalus. Patchy supratentorial white matter hypodensities less than expected for patient's age, though non-specific are most compatible with chronic small vessel ischemic disease. No acute large vascular territory infarcts. No abnormal extra-axial fluid collections. Basal cisterns are patent. VASCULAR: Moderate calcific atherosclerosis of the carotid siphons. SKULL: No skull fracture. No significant scalp soft tissue swelling. SINUSES/ORBITS: Chronic RIGHT maxillary sinusitis with central calcifications seen with inspissation and fungal sinusitis. Severe LEFT sphenoid sinusitis. The included ocular globes and orbital contents are non-suspicious. OTHER: Poor dentition with multiple dental caries and periapical abscess. Mastoid air cells are well aerated. CT CERVICAL SPINE FINDINGS ALIGNMENT: Maintained lordosis. Grade 1 C7-T1 anterolisthesis on degenerative basis. SKULL BASE AND VERTEBRAE: Cervical vertebral bodies and posterior elements are intact. Multilevel severe disc height loss with endplate spurring and subchondral cyst compatible with degenerative discs. Moderate cervical facet arthropathy. C1-2 articulation maintained with mild arthropathy. Small amount of calcified pannus about the odontoid process seen with CPPD. SOFT TISSUES AND SPINAL CANAL: Nonacute. Moderate calcific atherosclerosis carotid bifurcations. Nuchal ligament calcifications. DISC LEVELS: Moderate to severe RIGHT C3-4 and RIGHT C5-6 neural foraminal narrowing. No canal stenosis. UPPER CHEST: Lung apices are clear. OTHER: None. IMPRESSION: CT HEAD: 1. No acute intracranial process. 2. Moderate to  severe parenchymal brain volume loss, most conspicuous within midbrain associated with neurodegenerative disorders. 3. Mild chronic small vessel ischemic disease. CT CERVICAL SPINE: 1. No acute fracture or malalignment. 2. Moderate to severe RIGHT C3-4 and C5-6 neural foraminal narrowing. Electronically Signed   By: Awilda Metroourtnay  Bloomer M.D.   On: 02/18/2017 15:02    EKG: Independently reviewed.  EKG shows normal sinus rhythm with T wave inversions anteriorly which appears to be new.  Assessment/Plan Principal Problem:   CAP (community acquired pneumonia) Active Problems:   Parkinson disease (HCC)   Coronary artery disease involving native coronary artery of native heart without angina pectoris   Acute lower UTI   Pneumonia    1. Community-acquired pneumonia and UTI -patient has been placed on ceftriaxone and Zithromax (it has been more than 3 months since patient was in the hospital for acute respiratory failure secondary to  aspiration and CHF).  Follow sputum cultures urine for Legionella strep antigen and urine cultures.  Speech therapist evaluation. 2. Possible suicidal thoughts -please consult psychiatry in a.m.  Patient is on suicide precautions. 3. Parkinson's disease we will continue home medications. 4. CAD status post CABG -EKG shows some T wave inversions.  We will cycle cardiac markers.  Patient is on aspirin metoprolol and statins. 5. Chronic systolic heart failure appears to be compensated continue patient's home diuretics. 6. Chronic pain on tramadol and Duragesic patch. 7. Chronic kidney disease stage III creatinine appears to be at baseline follow metabolic panel. 8. Anxiety and depression on Lexapro and Klonopin.  I am unable to reach patient's daughter through the phone.  Will need to confirm some of patient's home medications including Duragesic patch and Lasix dose.   DVT prophylaxis: Lovenox. Code Status: Full code. Family Communication: Unable to reach  family. Disposition Plan: To be determined. Consults called: None. Admission status: Inpatient.   Eduard Clos MD [facility] Pager [phone].  If 7PM-7AM, please contact night-coverage [email or web address] Password [email or web address]  02/19/2017, 4:25 AM
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
## 2017-02-19 NOTE — ED Notes (Signed)
No respiratory or acute distress noted alert and confused no reaction to medication noted call light in reach hospital sitter at bedside.

## 2017-02-19 NOTE — Telephone Encounter (Signed)
-----   [facility] sent at 02/19/2017 12:13 PM EST ----- Dr Nedra HaiLee from [facility] called and wanted to speak to you about your mutual pt and that he has been hospitalized and wanted to discuss his medications with you, she did say it was not urgent and you could call when you had some free time/Dawn Cell# [phone]

## 2017-02-19 NOTE — ED Notes (Signed)
REPORT GIVEN TO FLOOR RN.

## 2017-02-19 NOTE — ED Notes (Signed)
No reaction to medication noted resting in bed with eyes closed call light in reach hospital sitter at bedside no respiratory or acute distress noted.

## 2017-02-19 NOTE — Telephone Encounter (Signed)
Called his physician.  She wanted to know if his pain medications (including fentanyl) would interfere with the Parkinson's medication.  I explained that I have not seen him in a long time and that Parkinson's medication has been changed in the hospital.  She stated that they had not been changed.  According hospital records, they had been.  She stated that he was no longer on Apokyn, but continued on ropinirole.  We discussed the fact that [facility] and discharge notes from the hospital indicated that there were significant psychosocial issues going on with the family and patient.  Discussed that [facility] services may be of value to the patient.  She has already suggested that to the family and it appears that they may be taking him there.  They are trying to get him into [facility].  Told his physician that, as far as I know, the pain medicines would not interfere with his Parkinson's medication, although I do not necessarily recommend fentanyl in this age group.  However, I do not necessarily recommend ropinirole in him either and I had only seen him one time and his medications were not ideal and required re-working.  She was going to recommend a follow-up.
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
## 2017-02-20 ENCOUNTER — Inpatient Hospital Stay (HOSPITAL_COMMUNITY): Payer: Medicare Other

## 2017-02-20 LAB — COMPREHENSIVE METABOLIC PANEL
ALK PHOS: 96 U/L (ref 38–126)
AST: 26 U/L (ref 15–41)
Albumin: 2.3 g/dL — ABNORMAL LOW (ref 3.5–5.0)
Anion gap: 9 (ref 5–15)
BUN: 22 mg/dL — AB (ref 6–20)
CALCIUM: 8.6 mg/dL — AB (ref 8.9–10.3)
CO2: 34 mmol/L — ABNORMAL HIGH (ref 22–32)
CREATININE: 1.56 mg/dL — AB (ref 0.61–1.24)
Chloride: 91 mmol/L — ABNORMAL LOW (ref 101–111)
GFR, EST AFRICAN AMERICAN: 46 mL/min — AB (ref >60)
GFR, EST NON AFRICAN AMERICAN: 40 mL/min — AB (ref >60)
Glucose, Bld: 91 mg/dL (ref 65–99)
Potassium: 4 mmol/L (ref 3.5–5.1)
Sodium: 134 mmol/L — ABNORMAL LOW (ref 135–145)
Total Bilirubin: 0.7 mg/dL (ref 0.3–1.2)
Total Protein: 6.4 g/dL — ABNORMAL LOW (ref 6.5–8.1)

## 2017-02-20 LAB — BLOOD GAS, VENOUS
ACID-BASE EXCESS: 11 mmol/L — AB (ref 0.0–2.0)
Bicarbonate: 36.4 mmol/L — ABNORMAL HIGH (ref 20.0–28.0)
DRAWN BY: 471411
O2 SAT: 99.1 %
PCO2 VEN: 53.8 mmHg (ref 44.0–60.0)
PH VEN: 7.446 — AB (ref 7.250–7.430)
Patient temperature: 98.6
pO2, Ven: 168 mmHg — ABNORMAL HIGH (ref 32.0–45.0)

## 2017-02-20 LAB — CBC
HEMATOCRIT: 29.6 % — AB (ref 39.0–52.0)
HEMOGLOBIN: 9.6 g/dL — AB (ref 13.0–17.0)
MCH: 30.8 pg (ref 26.0–34.0)
MCHC: 32.4 g/dL (ref 30.0–36.0)
MCV: 94.9 fL (ref 78.0–100.0)
Platelets: 224 10*3/uL (ref 150–400)
RBC: 3.12 MIL/uL — AB (ref 4.22–5.81)
RDW: 15 % (ref 11.5–15.5)
WBC: 8.9 10*3/uL (ref 4.0–10.5)

## 2017-02-20 MED ORDER — FUROSEMIDE 20 MG PO TABS
60.0000 mg | ORAL_TABLET | Freq: Two times a day (BID) | ORAL | Status: DC
  Administered 2017-02-20 – 2017-02-23 (×6): 60 mg via ORAL
  Filled 2017-02-20 (×6): qty 3

## 2017-02-20 MED ORDER — RESOURCE THICKENUP CLEAR PO POWD
ORAL | Status: DC | PRN
  Filled 2017-02-20: qty 125

## 2017-02-20 MED ORDER — STARCH (THICKENING) PO POWD
ORAL | Status: DC | PRN
  Filled 2017-02-20: qty 227

## 2017-02-20 MED ORDER — METOPROLOL TARTRATE 25 MG PO TABS
25.0000 mg | ORAL_TABLET | Freq: Two times a day (BID) | ORAL | Status: DC
  Administered 2017-02-20 – 2017-02-23 (×6): 25 mg via ORAL
  Filled 2017-02-20 (×6): qty 1

## 2017-02-20 MED ORDER — CLONAZEPAM 0.5 MG PO TABS
0.5000 mg | ORAL_TABLET | Freq: Three times a day (TID) | ORAL | Status: DC
  Administered 2017-02-20 – 2017-02-23 (×8): 0.5 mg via ORAL
  Filled 2017-02-20 (×8): qty 1
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
## 2017-02-20 NOTE — Progress Notes (Signed)
Modified Barium Swallow Progress Note  Patient Details  Name: Jack MiniumCharles L Coll MRN: [id number] Date of Birth: 07/18/1933  Today's Date: 02/20/2017  Modified Barium Swallow completed.  Full report located under Chart Review in the Imaging Section.  Brief recommendations include the following:  Clinical Impression  Pt presents with a mild-mod oropharyngeal dysphagia marked by oral discoordination with decreased bolus cohesion; pharyngeal swallow triggers at level of pyriforms.  There is vallecular residue post-swallow with solid materials - its presence leads to moderate aspiration of thin liquids, accompanied by a cough response.  When pharyngeal spaces are clear of residue and pt consumes small, controlled sips of thin liquid, there was no aspiration.  Nectar-thick liquids were tolerated with no penetration/aspiration.  Recommend advancing diet to dysphagia 2; continue nectar-thick liquids.  SLP will follow for therapeutic exercise; trials thin liquids outside of meals.  D/W pt, who lacks insight about prior and current swallowing difficulties.    Swallow Evaluation Recommendations       SLP Diet Recommendations: Dysphagia 2 (Fine chop) solids;Nectar thick liquid   Liquid Administration via: Cup   Medication Administration: Crushed with puree   Supervision: Patient able to self feed   Compensations: Minimize environmental distractions;Slow rate;Small sips/bites       Oral Care Recommendations: Oral care BID   Other Recommendations: Order thickener from pharmacy    Blenda Mountsouture, Admir Candelas Laurice 02/20/2017,3:37 PM
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
## 2017-02-20 NOTE — Evaluation (Signed)
Clinical/Bedside Swallow Evaluation Patient Details  Name: Jack Carter MRN: [id number] Date of Birth: 04/17/1933  Today's Date: 02/20/2017 Time: SLP Start Time (ACUTE ONLY): 1130 SLP Stop Time (ACUTE ONLY): 1200 SLP Time Calculation (min) (ACUTE ONLY): 30 min  Past Medical History:  Past Medical History:  Diagnosis Date  . Cellulitis   . Coronary artery disease    CABG  . Essential hypertension   . Heart attack (HCC)   . Hyperlipidemia   . Hypertension   . Parkinson's disease (HCC)   . Restless leg syndrome    Past Surgical History:  Past Surgical History:  Procedure Laterality Date  . APPENDECTOMY    . CARDIAC SURGERY  2000  . CARPAL TUNNEL RELEASE Left   . CORONARY ARTERY BYPASS GRAFT    . IR GASTROSTOMY TUBE MOD SED  12/10/2016  . STOMACH SURGERY  2003   intestinal fissure   HPI:  83 y.o.malewithhistory of CAD status post CABG, systolic heart failure, chronic kidney disease, Parkinson's disease and recently admitted in September [id number] respiratory failure secondary to aspiration and CHF was brought to the ER after patient has been having frequent falls and also possible suicidal thoughts as per the daughter who discussed with the ER physician. This is the second visit in the same day for the same complaints.  Pt with dx of CAP, UTI. Recent XCR with consolidation left base. Pt with hx of dysphagia with aspiration in Sept 2018 after multiple intubations.  [facility] 9[facility]13; PEG[facility].  MBS from Sept 10 revealed trace silent aspiration due to delayed glottal closure, prolonged oral preparation.  He had a repeat MBS in October, but the records are inaccessible.  Currently with PEG but pt states it is not used.    Assessment / Plan / Recommendation Clinical Impression  Pt presents with moderate dysarthria, mild oral weakness, multiple subswallows with thin liquid boluses, no overt coughing.  He has hx of silent aspiration, and per dtr,  Jack Carter, has been on thickened liquids since September.  [facility] need for continued modified diet, but it doesn't appear that pt has had another objective study.  While pt is here, he would benefit from a repeat MBS to ensure that he is on the most appropriate diet.  Until then, continue purees/nectar thick liquids.  D/W RN and Jack Carter.   SLP Visit Diagnosis: Dysphagia, unspecified (R13.10)    Aspiration Risk       Diet Recommendation   dys1, nectars pending MBS  Medication Administration: Whole meds with puree    Other  Recommendations Oral Care Recommendations: Oral care BID   Follow up Recommendations        Frequency and Duration            Prognosis        Swallow Study   General HPI: 83 y.o.malewithhistory of CAD status post CABG, systolic heart failure, chronic kidney disease, Parkinson's disease and recently admitted in September [id number] respiratory failure secondary to aspiration and CHF was brought to the ER after patient has been having frequent falls and also possible suicidal thoughts as per the daughter who discussed with the ER physician. This is the second visit in the same day for the same complaints.  Pt with dx of CAP, UTI. Recent XCR with consolidation left base. Pt with hx of dysphagia with aspiration in Sept 2018 after multiple intubations.  [facility]13; [name].  MBS from Sept 10 revealed trace silent aspiration due to delayed glottal closure, prolonged oral preparation.  He had a repeat MBS in October, but the records are inaccessible.  Currently with PEG but pt states it is not used.  Type of Study: Bedside Swallow Evaluation Previous Swallow Assessment: see HPI Diet Prior to this Study: Dysphagia 1 (puree);Nectar-thick liquids Temperature Spikes Noted: No Respiratory Status: Nasal cannula History of Recent Intubation: No Behavior/Cognition: Alert;Cooperative Oral Cavity Assessment: Within  Functional Limits Oral Care Completed by SLP: No Oral Cavity - Dentition: Missing dentition Vision: Functional for self-feeding Self-Feeding Abilities: Able to feed self Patient Positioning: Upright in bed Baseline Vocal Quality: Low vocal intensity Volitional Cough: Strong Volitional Swallow: Able to elicit    Oral/Motor/Sensory Function Overall Oral Motor/Sensory Function: Generalized oral weakness   Ice Chips Ice chips: Not tested   Thin Liquid Thin Liquid: Impaired Presentation: Cup Pharyngeal  Phase Impairments: Multiple swallows    Nectar Thick Nectar Thick Liquid: Within functional limits   Honey Thick Honey Thick Liquid: Not tested   Puree Puree: Within functional limits   Solid   GO   Solid: Within functional limits        Jack Carter 02/20/2017,12:24 PM

## 2017-02-21 LAB — URINALYSIS, ROUTINE W REFLEX MICROSCOPIC
Bacteria, UA: NONE SEEN
Bilirubin Urine: NEGATIVE
GLUCOSE, UA: NEGATIVE mg/dL
Ketones, ur: NEGATIVE mg/dL
NITRITE: NEGATIVE
PH: 6 (ref 5.0–8.0)
PROTEIN: NEGATIVE mg/dL
SPECIFIC GRAVITY, URINE: 1.011 (ref 1.005–1.030)
Squamous Epithelial / LPF: NONE SEEN

## 2017-02-21 LAB — COMPREHENSIVE METABOLIC PANEL
ALT: 8 U/L — AB (ref 17–63)
AST: 23 U/L (ref 15–41)
Albumin: 2.4 g/dL — ABNORMAL LOW (ref 3.5–5.0)
Alkaline Phosphatase: 95 U/L (ref 38–126)
Anion gap: 8 (ref 5–15)
BILIRUBIN TOTAL: 0.6 mg/dL (ref 0.3–1.2)
BUN: 21 mg/dL — AB (ref 6–20)
CO2: 35 mmol/L — ABNORMAL HIGH (ref 22–32)
CREATININE: 1.5 mg/dL — AB (ref 0.61–1.24)
Calcium: 8.5 mg/dL — ABNORMAL LOW (ref 8.9–10.3)
Chloride: 91 mmol/L — ABNORMAL LOW (ref 101–111)
GFR, EST AFRICAN AMERICAN: 49 mL/min — AB (ref >60)
GFR, EST NON AFRICAN AMERICAN: 42 mL/min — AB (ref >60)
Glucose, Bld: 96 mg/dL (ref 65–99)
Potassium: 3.8 mmol/L (ref 3.5–5.1)
Sodium: 134 mmol/L — ABNORMAL LOW (ref 135–145)
TOTAL PROTEIN: 6.6 g/dL (ref 6.5–8.1)

## 2017-02-21 LAB — CBC
HEMATOCRIT: 30.1 % — AB (ref 39.0–52.0)
Hemoglobin: 9.7 g/dL — ABNORMAL LOW (ref 13.0–17.0)
MCH: 30.5 pg (ref 26.0–34.0)
MCHC: 32.2 g/dL (ref 30.0–36.0)
MCV: 94.7 fL (ref 78.0–100.0)
Platelets: 222 10*3/uL (ref 150–400)
RBC: 3.18 MIL/uL — AB (ref 4.22–5.81)
RDW: 14.7 % (ref 11.5–15.5)
WBC: 8.2 10*3/uL (ref 4.0–10.5)

## 2017-02-21 NOTE — Evaluation (Signed)
Physical Therapy Evaluation Patient Details Name: Jack MiniumCharles L Oliveri MRN: [id number] DOB: 05/18/1933 Today's Date: 02/21/2017   History of Present Illness  Jack MiniumCharles L Mclees is a 83 y.o. male with history of CAD status post CABG, systolic heart failure, chronic kidney disease, Parkinson's disease and recently admitted in September 2018 for respiratory failure secondary to aspiration and CHF was brought to the ER after patient has been having frequent falls and also possible suicidal thoughts as per the daughter who discussed with the ER physician.  This is the second visit in the same day for the same complaints.  I am unable to reach patient started to give more history.  Patient states that he suddenly fell off the wheelchair hit his head but denies losing consciousness.  As per the daughter will give a history to the ER physician patient has been following deliberately.  They are also concerned that patient was throwing some knife around.PMH inlucdes: CAD with CABG, CHF, R LE cellutlitis, Parkinsons  Clinical Impression  Pt admitted with above diagnosis. Pt currently with functional limitations due to the deficits listed below (see PT Problem List).  Pt will benefit from skilled PT to increase their independence and safety with mobility to allow discharge to the venue listed below.  Pt required MAX of 2 to safely transfer into recliner with RW.  Recommend STEDY for nursing staff.  Recommend SNF and 24 hour S.     Follow Up Recommendations SNF;Supervision/Assistance - 24 hour    Equipment Recommendations  None recommended by PT    Recommendations for Other Services       Precautions / Restrictions Precautions Precautions: Fall Restrictions Weight Bearing Restrictions: No      Mobility  Bed Mobility Overal bed mobility: Needs Assistance Bed Mobility: Rolling;Sidelying to Sit Rolling: Min assist Sidelying to sit: Mod assist;+2 for physical assistance          Transfers Overall  transfer level: Needs assistance Equipment used: Rolling walker (2 wheeled) Transfers: Sit to/from [employer]Stand;Stand Pivot Transfers Sit to Stand: Max assist;+2 physical assistance Stand pivot transfers: Max assist;+2 physical assistance       General transfer comment: Pt needed cueing for every step with SPT and physical A to weight shift.  Spoke with nursing about using STEDY backto bed.  Ambulation/Gait                Stairs            Wheelchair Mobility    Modified Rankin (Stroke Patients Only)       Balance Overall balance assessment: Needs assistance   Sitting balance-Leahy Scale: Poor       Standing balance-Leahy Scale: Zero                               Pertinent Vitals/Pain Pain Assessment: Faces Faces Pain Scale: Hurts a little bit Pain Location: R ankle and bottom and side of R foot Pain Intervention(s): Monitored during session    Home Living Family/patient expects to be discharged to:: Skilled nursing facility Living Arrangements: Children                    Prior Function Level of Independence: Needs assistance   Gait / Transfers Assistance Needed: States he did a SPT with walker to get into w/c with A.  States he could stand to the walker and that he could propel w/c.  Hand Dominance        Extremity/Trunk Assessment   Upper Extremity Assessment Upper Extremity Assessment: Generalized weakness    Lower Extremity Assessment Lower Extremity Assessment: Generalized weakness;RLE deficits/detail RLE Deficits / Details: R ankle inverted       Communication   Communication: (soft spoken and difficult to understand at times)  Cognition Arousal/Alertness: Awake/alert Behavior During Therapy: WFL for tasks assessed/performed Overall Cognitive Status: Within Functional Limits for tasks assessed                                 General Comments: pt oriented, but difficult to understand.  When  discussed getting into the recliner, pt did ask PT if we were "going to kill him?"  Pt reassured we were just getting up to chair.      General Comments      Exercises     Assessment/Plan    PT Assessment Patient needs continued PT services  PT Problem List Decreased strength;Decreased activity tolerance;Decreased balance;Decreased mobility       PT Treatment Interventions Functional mobility training;Therapeutic activities;Therapeutic exercise;Balance training;Neuromuscular re-education    PT Goals (Current goals can be found in the Care Plan section)  Acute Rehab PT Goals Patient Stated Goal: none state PT Goal Formulation: With patient Time For Goal Achievement: 03/07/17 Potential to Achieve Goals: Fair    Frequency Min 2X/week   Barriers to discharge        Co-evaluation               AM-PAC PT "6 Clicks" Daily Activity  Outcome Measure Difficulty turning over in bed (including adjusting bedclothes, sheets and blankets)?: Unable Difficulty moving from lying on back to sitting on the side of the bed? : Unable Difficulty sitting down on and standing up from a chair with arms (e.g., wheelchair, bedside commode, etc,.)?: Unable Help needed moving to and from a bed to chair (including a wheelchair)?: Total Help needed walking in hospital room?: Total Help needed climbing 3-5 steps with a railing? : Total 6 Click Score: 6    End of Session Equipment Utilized During Treatment: Gait belt Activity Tolerance: Patient tolerated treatment well Patient left: in chair;with chair alarm set Nurse Communication: Mobility status;Need for lift equipment PT Visit Diagnosis: Unsteadiness on feet (R26.81);Muscle weakness (generalized) (M62.81);History of falling (Z91.81)    Time: [id number] PT Time Calculation (min) (ACUTE ONLY): 25 min   Charges:   PT Evaluation $PT Eval Moderate Complexity: 1 Mod PT Treatments $Therapeutic Activity: 8-22 mins   PT G Codes:         Kathrynn Backstrom L. Katrinka BlazingSmith, [place]PT Pager [phone] 02/21/2017   Enzo MontgomeryKaren L Courtni Balash 02/21/2017, 11:48 AM

## 2017-02-21 NOTE — [place] FL2 (Signed)
MEDICAID FL2 LEVEL OF CARE SCREENING TOOL     IDENTIFICATION  Patient Name: Jack Carter Birthdate: 01/27/1934 Sex: male Admission Date (Current Location): 02/18/2017  [facility] [place]   [facility] and Address[facility]  [facility],  [address], [place] [id number]      Provider Number: [id number]  Attending Physician Name and Address:  Zigmund DanielPowell, A Caldwell Jr., *  Relative Name and Phone Number:       Current Level of Care: Hospital Recommended Level of Care: Skilled Nursing Facility Prior Approval Number:    Date Approved/Denied:   PASRR Number:   [id number]   Discharge Plan: SNF    Current Diagnoses: Patient Active Problem List   Diagnosis Date Noted  . Acute lower UTI 02/19/2017  . Pneumonia 02/19/2017  . CAP (community acquired pneumonia) 02/18/2017  . 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 Acute tracheostomy management (HCC)   . Bradycardia, sinus   . Dyspnea   . Acute respiratory failure (HCC)   . Coronary artery disease involving native coronary artery of native heart without angina pectoris   . Acute on chronic combined systolic and diastolic CHF (congestive heart failure) (HCC)   . Great toe pain, unspecified laterality   . Aspiration into airway   . Hypernatremia   . Elevated troponin 11/04/2016  . Acute encephalopathy   . Goals of care, counseling/discussion   . Septic shock (HCC) 10/31/2016  . Cellulitis 10/31/2016  . Hyperkalemia 10/31/2016  . AKI (acute kidney injury) (HCC) 10/31/2016  . Cardiogenic shock (HCC) 10/31/2016  . Acute hypoxemic respiratory failure (HCC) 10/31/2016  . CAD (coronary artery disease) of artery bypass graft 10/31/2016  . CHF (congestive heart failure) (HCC) 10/31/2016  . Parkinson disease (HCC) 10/31/2016  . Upper GI bleed 10/31/2016    Orientation RESPIRATION BLADDER Height & Weight     Self, Situation, Time  O2(2L) Incontinent  Weight: 140 lb 12.8 oz (63.9 kg) Height:  5\' 9"  (175.3 cm)  BEHAVIORAL SYMPTOMS/MOOD NEUROLOGICAL BOWEL NUTRITION STATUS        Diet(DYS 2)  AMBULATORY STATUS COMMUNICATION OF NEEDS Skin   Extensive Assist Verbally Normal                       Personal Care Assistance Level of Assistance  Bathing, Feeding, Dressing Bathing Assistance: Limited assistance Feeding assistance: Independent Dressing Assistance: Limited assistance     Functional Limitations Info             SPECIAL CARE FACTORS FREQUENCY  PT (By licensed PT), OT (By licensed OT)     PT Frequency: 5 OT Frequency: 5            Contractures      Additional Factors Info  Code Status, Allergies Code Status Info: Full Code  Allergies Info: NKA            Current Medications (02/21/2017):  This is the current hospital active medication list Current Facility-Administered Medications  Medication Dose Route Frequency Provider Last Rate Last Dose  . acetaminophen (TYLENOL) tablet 650 mg  650 mg Oral Q6H PRN Eduard ClosKakrakandy, Arshad N, MD   650 mg at 02/21/17 [id number]   Or  . acetaminophen (TYLENOL) suppository 650 mg  650 mg Rectal Q6H PRN Eduard ClosKakrakandy, Arshad N, MD      . aspirin EC tablet 81 mg  81 mg Oral Daily Eduard ClosKakrakandy, Arshad N, MD   81 mg at  02/21/17 0955  . atorvastatin (LIPITOR) tablet 10 mg  10 mg Oral QHS Eduard ClosKakrakandy, Arshad N, MD   10 mg at 02/20/17 2112  . azithromycin (ZITHROMAX) 500 mg in dextrose 5 % 250 mL IVPB  500 mg Intravenous Q24H Zigmund DanielPowell, A Caldwell Jr., MD 250 mL/hr at 02/20/17 1750 500 mg at 02/20/17 1750  . carbidopa-levodopa (SINEMET IR) 25-100 MG per tablet immediate release 1 tablet  1 tablet Oral TID Eduard ClosKakrakandy, Arshad N, MD   1 tablet at 02/21/17 0955  . cefTRIAXone (ROCEPHIN) 2 g in dextrose 5 % 50 mL IVPB  2 g Intravenous Q24H Zigmund DanielPowell, A Caldwell Jr., MD   Stopped at 02/21/17 1144  . citalopram (CELEXA) tablet 10 mg  10 mg Oral Daily Zigmund DanielPowell, A Caldwell Jr., MD   10 mg at 02/21/17 0955  .  clonazePAM (KLONOPIN) tablet 0.5 mg  0.5 mg Oral TID Zigmund DanielPowell, A Caldwell Jr., MD   0.5 mg at 02/21/17 1351  . enoxaparin (LOVENOX) injection 40 mg  40 mg Subcutaneous Q24H Eduard ClosKakrakandy, Arshad N, MD   40 mg at 02/21/17 0955  . famotidine (PEPCID) tablet 20 mg  20 mg Oral Daily Eduard ClosKakrakandy, Arshad N, MD   20 mg at 02/21/17 0955  . fentaNYL (DURAGESIC - dosed mcg/hr) patch 25 mcg  25 mcg Transdermal Q72H Eduard ClosKakrakandy, Arshad N, MD   25 mcg at 02/21/17 [id number]  . furosemide (LASIX) tablet 60 mg  60 mg Oral BID Zigmund DanielPowell, A Caldwell Jr., MD   60 mg at 02/21/17 [id number]  . gabapentin (NEURONTIN) capsule 600 mg  600 mg Oral BID Eduard ClosKakrakandy, Arshad N, MD   600 mg at 02/21/17 0955  . metoprolol tartrate (LOPRESSOR) tablet 25 mg  25 mg Oral BID Zigmund DanielPowell, A Caldwell Jr., MD   25 mg at 02/21/17 1001  . ondansetron (ZOFRAN) tablet 4 mg  4 mg Oral Q6H PRN Eduard ClosKakrakandy, Arshad N, MD       Or  . ondansetron [facility](ZOFRAN) injection 4 mg  4 mg Intravenous Q6H PRN Eduard ClosKakrakandy, Arshad N, MD      . primidone (MYSOLINE) tablet 50 mg  50 mg Oral Q12H Eduard ClosKakrakandy, Arshad N, MD   50 mg at 02/21/17 1001  . RESOURCE THICKENUP CLEAR   Oral PRN Aleda GranaZeigler, Dustin G, RPH      . traMADol (ULTRAM) tablet 50 mg  50 mg Oral BID Eduard ClosKakrakandy, Arshad N, MD   50 mg at 02/21/17 [id number]     Discharge Medications: Please see discharge summary for a list of discharge medications.  Relevant Imaging Results:  Relevant Lab Results:   Additional Information SS#: [id number]  Jack CoffinErin M Tatem Fesler, LCSW
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
## 2017-02-22 DIAGNOSIS — N3001 Acute cystitis with hematuria: Secondary | ICD-10-CM

## 2017-02-22 DIAGNOSIS — F4325 Adjustment disorder with mixed disturbance of emotions and conduct: Secondary | ICD-10-CM

## 2017-02-22 DIAGNOSIS — G20 Parkinson's disease: Secondary | ICD-10-CM

## 2017-02-22 DIAGNOSIS — J181 Lobar pneumonia, unspecified organism: Secondary | ICD-10-CM

## 2017-02-22 LAB — BASIC METABOLIC PANEL
Anion gap: 8 (ref 5–15)
BUN: 22 mg/dL — ABNORMAL HIGH (ref 6–20)
CALCIUM: 8.7 mg/dL — AB (ref 8.9–10.3)
CHLORIDE: 92 mmol/L — AB (ref 101–111)
CO2: 37 mmol/L — ABNORMAL HIGH (ref 22–32)
CREATININE: 1.71 mg/dL — AB (ref 0.61–1.24)
GFR, EST AFRICAN AMERICAN: 41 mL/min — AB (ref >60)
GFR, EST NON AFRICAN AMERICAN: 36 mL/min — AB (ref >60)
Glucose, Bld: 89 mg/dL (ref 65–99)
Potassium: 3.7 mmol/L (ref 3.5–5.1)
Sodium: 137 mmol/L (ref 135–145)

## 2017-02-22 LAB — CBC
HCT: 31.7 % — ABNORMAL LOW (ref 39.0–52.0)
HEMOGLOBIN: 10.2 g/dL — AB (ref 13.0–17.0)
MCH: 31 pg (ref 26.0–34.0)
MCHC: 32.2 g/dL (ref 30.0–36.0)
MCV: 96.4 fL (ref 78.0–100.0)
PLATELETS: 212 10*3/uL (ref 150–400)
RBC: 3.29 MIL/uL — ABNORMAL LOW (ref 4.22–5.81)
RDW: 14.6 % (ref 11.5–15.5)
WBC: 8.9 10*3/uL (ref 4.0–10.5)

## 2017-02-22 MED ORDER — ENOXAPARIN SODIUM 30 MG/0.3ML ~~LOC~~ SOLN
30.0000 mg | SUBCUTANEOUS | Status: DC
  Administered 2017-02-23: 30 mg via SUBCUTANEOUS
  Filled 2017-02-22: qty 0.3

## 2017-02-22 MED ORDER — SENNOSIDES-DOCUSATE SODIUM 8.6-50 MG PO TABS
1.0000 | ORAL_TABLET | Freq: Two times a day (BID) | ORAL | Status: DC
  Administered 2017-02-22 – 2017-02-23 (×3): 1 via ORAL
  Filled 2017-02-22 (×3): qty 1

## 2017-02-22 MED ORDER — POLYETHYLENE GLYCOL 3350 17 G PO PACK
17.0000 g | PACK | Freq: Every day | ORAL | Status: DC
  Administered 2017-02-22 – 2017-02-23 (×2): 17 g via ORAL
  Filled 2017-02-22 (×2): qty 1
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
## 2017-02-23 DIAGNOSIS — F32A Depression, unspecified: Secondary | ICD-10-CM

## 2017-02-23 DIAGNOSIS — F329 Major depressive disorder, single episode, unspecified: Secondary | ICD-10-CM

## 2017-02-23 DIAGNOSIS — F419 Anxiety disorder, unspecified: Secondary | ICD-10-CM

## 2017-02-23 LAB — URINE CULTURE

## 2017-02-23 LAB — LEGIONELLA PNEUMOPHILA SEROGP 1 UR AG: L. pneumophila Serogp 1 Ur Ag: NEGATIVE

## 2017-02-23 MED ORDER — FENTANYL 25 MCG/HR TD PT72: 25.0000 ug | MEDICATED_PATCH | TRANSDERMAL | 0 refills | Status: DC

## 2017-02-23 MED ORDER — CLONAZEPAM 0.5 MG PO TABS: 0.5000 mg | ORAL_TABLET | Freq: Three times a day (TID) | ORAL | 0 refills | Status: DC

## 2017-02-23 MED ORDER — TRAMADOL HCL 50 MG PO TABS: 50.0000 mg | ORAL_TABLET | Freq: Two times a day (BID) | ORAL | 0 refills | Status: DC

## 2017-02-23 MED ORDER — CITALOPRAM HYDROBROMIDE 10 MG PO TABS: 10.0000 mg | ORAL_TABLET | Freq: Every day | ORAL | 0 refills | Status: AC

## 2017-02-23 MED ORDER — POLYETHYLENE GLYCOL 3350 17 G PO PACK: 17.0000 g | PACK | Freq: Every day | ORAL | 0 refills | Status: AC

## 2017-02-23 NOTE — Discharge Summary (Signed)
Physician Discharge Summary  Midge MiniumCharles L Manter NFA:[id number] DOB: 01/19/1934 DOA: 02/18/2017  PCP: [name], [name], DO  Admit date: 02/18/2017 Discharge date: 02/23/2017  Admitted From:home Disposition:SNF  Recommendations for Outpatient Follow-up:  1. Follow up with PCP in 1-2 weeks 2. Please obtain BMP/CBC in one week   Home Health:SNF Equipment/Devices:none Discharge Condition:stable CODE STATUS:full code Diet recommendation:heart healthy  Brief/Interim Summary: 83 y.o.malewithhistory of CAD status post CABG, systolic heart failure, chronic kidney disease, Parkinson's disease and recently admitted in September [id number] respiratory failure secondary to aspiration and CHF was brought to the ER after patient has been having frequent falls and also possible suicidal thoughts as per the daughter who discussed with the ER physician. Was found to have possible UTI and pneumonia, treated with ceftriaxone and azithromycin. Psychiatry was consulted and noted that his behavior is secondary to attention seeking/acting out in setting of multiple major life changes and did not recommend inpatient psych hospitalization.   #Community-acquired pneumonia (left lower lobe) and urinary tract infection: Completed ceftriaxone and azithromycin.  Patient does not have urinary symptoms.  -clinically improved  #Possible suicidal thoughts on admission seen by psychiatry.  Continue current psychiatric medication including Celexa, Klonopin.  Patient denied any suicidal or homicidal ideation.  Per psych, inpatient psych not recommended, behavior likely due to major life changes and patient will benefit from rehab and skilled nursing home placement.    As per Child psychotherapistsocial worker, patient has bed at a skilled nursing facility.  Able to transfer patient for further care. -Lexapro discontinued and  started on Celexa as per psychiatric.  #History of dysphagia: Continue swallow evaluation and dysphagia diet.  #History  of Parkinson disease: Current medication continue.  #History of coronary artery disease status post CABG: No chest pain.  Continue aspirin, metoprolol, statin.  #History of chronic systolic congestive heart failure: Euvolemic on exam.  Continue Lasix and metoprolol.  #Chronic pain: Stable now.  #Chronic kidney disease stage III: Serum creatinine level stable.  Monitor BMP.  Avoid nephrotoxins.  #History of anxiety depression  Recommended to follow-up with PCP outpatient.  Patient, antibiotics course.  Discharge Diagnoses:  Principal Problem:   Adjustment disorder with mixed disturbance of emotions and conduct Active Problems:   Parkinson disease (HCC)   Coronary artery disease involving native coronary artery of native heart without angina pectoris   CAP (community acquired pneumonia)   Acute lower UTI   Pneumonia   Acute cystitis with hematuria    Discharge Instructions  Discharge Instructions    Call MD for:  difficulty breathing, headache or visual disturbances   Complete by:  As directed    Call MD for:  extreme fatigue   Complete by:  As directed    Call MD for:  hives   Complete by:  As directed    Call MD for:  persistant dizziness or light-headedness   Complete by:  As directed    Call MD for:  persistant nausea and vomiting   Complete by:  As directed    Call MD for:  severe uncontrolled pain   Complete by:  As directed    Call MD for:  temperature >100.4   Complete by:  As directed    Diet - low sodium heart healthy   Complete by:  As directed    Increase activity slowly   Complete by:  As directed      Allergies as of 02/23/2017   No Known Allergies     Medication List    STOP taking  these medications   cephALEXin 500 MG capsule Commonly known as:  KEFLEX   escitalopram 5 MG tablet Commonly known as:  LEXAPRO   risperiDONE 0.5 MG tablet Commonly known as:  RISPERDAL     TAKE these medications   albuterol (2.5 MG/3ML) 0.083% nebulizer  solution Commonly known as:  PROVENTIL Take 3 mLs (2.5 mg total) by nebulization every 2 (two) hours as needed for wheezing or shortness of breath.   aspirin EC 81 MG tablet Take 81 mg by mouth daily.   atorvastatin 10 MG tablet Commonly known as:  LIPITOR Take 1 tablet (10 mg total) by mouth at bedtime.   carbidopa-levodopa 25-100 MG tablet Commonly known as:  SINEMET IR Take 1 tablet by mouth 3 (three) times daily.   citalopram 10 MG tablet Commonly known as:  CELEXA Take 1 tablet (10 mg total) by mouth daily. Start taking on:  02/24/2017   clonazePAM 0.5 MG tablet Commonly known as:  KLONOPIN Take 1 tablet (0.5 mg total) by mouth 3 (three) times daily. What changed:    when to take this  additional instructions   famotidine 20 MG tablet Commonly known as:  PEPCID Take 20 mg by mouth daily.   fentaNYL 25 MCG/HR patch Commonly known as:  DURAGESIC - dosed mcg/hr Place 1 patch (25 mcg total) onto the skin every 3 (three) days.   furosemide 20 MG tablet Commonly known as:  LASIX Place 3 tablets (60 mg total) into feeding tube 2 (two) times daily.   gabapentin 600 MG tablet Commonly known as:  NEURONTIN Take 1 tablet (600 mg total) by mouth 2 (two) times daily.   metoprolol tartrate 25 MG tablet Commonly known as:  LOPRESSOR Place 1 tablet (25 mg total) into feeding tube 2 (two) times daily.   MYLANTA 200-200-20 MG/5ML suspension Generic drug:  alum & mag hydroxide-simeth Take 30 mLs by mouth every 6 (six) hours as needed for indigestion or heartburn.   polyethylene glycol packet Commonly known as:  MIRALAX / GLYCOLAX Take 17 g by mouth daily. Start taking on:  02/24/2017   primidone 50 MG tablet Commonly known as:  MYSOLINE Take 1 tablet (50 mg total) by mouth every 12 (twelve) hours.   traMADol 50 MG tablet Commonly known as:  ULTRAM Take 1 tablet (50 mg total) by mouth 2 (two) times daily. 1 tablet at 9am and 1 tablet at 5pm daily       Contact  information for follow-up providers    Le, [name], DO. Schedule an appointment as soon as possible for a visit in 1 week(s).   Specialty:  Family Medicine Contact information: [address] [place] [address] [phone]            Contact information for after-discharge care    Destination    [facility] Follow up.   Service:  Skilled Nursing Contact information: [address] [place] [address] [phone]                 No Known Allergies  Consultations: Psychiatry  Procedures/Studies: None  Subjective: Seen and examined at bedside.  Denied headache, dizziness, nausea vomiting chest pain shortness of breath.  Discharge Exam: Vitals:   02/22/17 2132 02/23/17 0509  BP: (!) 120/47 (!) 100/51  Pulse: 98 (!) 56  Resp: 16 18  Temp: 98 F (36.7 C) 98.3 F (36.8 C)  SpO2: 100% 99%   Vitals:   02/22/17 0501 02/22/17 1454 02/22/17  2132 02/23/17 0509  BP: (!) 126/56 106/62 (!) 120/47 (!) 100/51  Pulse: 67 68 98 (!) 56  Resp: 18 18 16 18   Temp: 97.9 F (36.6 C) 98 F (36.7 C) 98 F (36.7 C) 98.3 F (36.8 C)  TempSrc: Oral Oral Oral Oral  SpO2: 100% 95% 100% 99%  Weight: 62.6 kg (138 lb 0.1 oz)   63.8 kg (140 lb 10.5 oz)  Height:        General: Pt is alert, awake, not in acute distress Cardiovascular: RRR, S1/S2 +, no rubs, no gallops Respiratory: CTA bilaterally, no wheezing, no rhonchi Abdominal: Soft, NT, ND, bowel sounds + Extremities: no edema, no cyanosis    The results of significant diagnostics from this hospitalization (including imaging, microbiology, ancillary and laboratory) are listed below for reference.     Microbiology: Recent Results (from the past 240 hour(s))  Urine culture     Status: Abnormal (Preliminary result)   Collection Time: 02/18/17  8:38 PM  Result Value Ref Range Status   Specimen Description URINE, CLEAN CATCH  Final   Special Requests NONE  Final   Culture (A)  Final    20,000  COLONIES/mL YEAST IDENTIFICATION TO FOLLOW SENT TO [facility] FOR FLUCONAZOLE MIC Performed at [facility], [address] [place], [place] [address]    Report Status PENDING  Incomplete  Culture, Urine     Status: Abnormal   Collection Time: 02/21/17 10:44 AM  Result Value Ref Range Status   Specimen Description URINE, RANDOM  Final   Special Requests NONE  Final   Culture (A)  Final    <10,000 COLONIES/mL Performed at [facility], [address], [place], [place] [address]    Report Status 02/23/2017 FINAL  Final     Labs: BNP (last 3 results) Recent Labs    10/31/16 2202 11/06/16 0729 11/08/16 0452  BNP 205.9* 813.0* 657.0*   Basic Metabolic Panel: Recent Labs  Lab 02/18/17 2110 02/19/17 0423 02/20/17 0450 02/21/17 0543 02/22/17 0501  NA 138 134* 134* 134* 137  K 4.4 4.3 4.0 3.8 3.7  CL 94* 92* 91* 91* 92*  CO2 36* 34* 34* 35* 37*  GLUCOSE 110* 90 91 96 89  BUN 19 20 22* 21* 22*  CREATININE 1.61* 1.55* 1.56* 1.50* 1.71*  CALCIUM 9.2 8.4* 8.6* 8.5* 8.7*   Liver Function Tests: Recent Labs  Lab 02/18/17 2110 02/20/17 0450 02/21/17 0543  AST 36 26 23  ALT 14* <5* 8*  ALKPHOS 133* 96 95  BILITOT 0.5 0.7 0.6  PROT 7.7 6.4* 6.6  ALBUMIN 2.8* 2.3* 2.4*   No results for input(s): LIPASE, AMYLASE in the last 168 hours. No results for input(s): AMMONIA in the last 168 hours. CBC: Recent Labs  Lab 02/18/17 2110 02/19/17 0423 02/20/17 0450 02/21/17 0543 02/22/17 0501  WBC 10.1 10.4 8.9 8.2 8.9  HGB 12.0* 10.5* 9.6* 9.7* 10.2*  HCT 37.2* 32.2* 29.6* 30.1* 31.7*  MCV 96.4 95.5 94.9 94.7 96.4  PLT 225 219 224 222 212   Cardiac Enzymes: Recent Labs  Lab 02/19/17 0423 02/19/17 1010 02/19/17 1554  TROPONINI 0.08* 0.08* 0.08*   BNP: Invalid input(s): POCBNP CBG: No results for input(s): GLUCAP in the last 168 hours. D-Dimer No results for input(s): DDIMER in the last 72 hours. Hgb A1c No results for input(s): HGBA1C in the last 72  hours. Lipid Profile No results for input(s): CHOL, HDL, LDLCALC, TRIG, CHOLHDL, LDLDIRECT in the last 72 hours. Thyroid  function studies No results for input(s): TSH, T4TOTAL, T3FREE, THYROIDAB in the last 72 hours.  Invalid input(s): FREET3 Anemia work up No results for input(s): VITAMINB12, FOLATE, FERRITIN, TIBC, IRON, RETICCTPCT in the last 72 hours. Urinalysis    Component Value Date/Time   COLORURINE YELLOW 02/21/2017 1044   APPEARANCEUR CLEAR 02/21/2017 1044   LABSPEC 1.011 02/21/2017 1044   PHURINE 6.0 02/21/2017 1044   GLUCOSEU NEGATIVE 02/21/2017 1044   HGBUR SMALL (A) 02/21/2017 1044   BILIRUBINUR NEGATIVE 02/21/2017 1044   KETONESUR NEGATIVE 02/21/2017 1044   PROTEINUR NEGATIVE 02/21/2017 1044   NITRITE NEGATIVE 02/21/2017 1044   LEUKOCYTESUR MODERATE (A) 02/21/2017 1044   Sepsis Labs Invalid input(s): PROCALCITONIN,  WBC,  LACTICIDVEN Microbiology Recent Results (from the past 240 hour(s))  Urine culture     Status: Abnormal (Preliminary result)   Collection Time: 02/18/17  8:38 PM  Result Value Ref Range Status   Specimen Description URINE, CLEAN CATCH  Final   Special Requests NONE  Final   Culture (A)  Final    20,000 COLONIES/mL YEAST IDENTIFICATION TO FOLLOW SENT TO [facility] FOR FLUCONAZOLE MIC Performed at [facility], [address] [place], [place] [address]    Report Status PENDING  Incomplete  Culture, Urine     Status: Abnormal   Collection Time: 02/21/17 10:44 AM  Result Value Ref Range Status   Specimen Description URINE, RANDOM  Final   Special Requests NONE  Final   Culture (A)  Final    <10,000 COLONIES/mL Performed at [facility], [address] [place], [place] [address]    Report Status 02/23/2017 FINAL  Final     Time coordinating discharge:33 minutes  SIGNED:   Maxie Barbron Prasad Bhandari, MD  [facility] 02/23/2017, 12:35 PM  If 7PM-7AM, please contact night-coverage [email or web address] Password [email or web address]

## 2017-02-23 NOTE — Clinical Social Work Placement (Signed)
  Pt to be discharged today and will admit to [facility] - report# [phone]. Will arrange [facility] transport once DC complete and pt ready for transfer. Spoke with pt's daughter- she is aware and agreeable to plan. Will send DC information via the [facility] when available  See below for placement information  CLINICAL SOCIAL WORK PLACEMENT  NOTE  Date:  02/23/2017  Patient Details  Name: Jack Carter MRN: [id number] Date of Birth: 01/15/1934  Clinical Social Work is seeking post-discharge placement for this patient at the Skilled  Nursing Facility level of care (*CSW will initial, date and re-position this form in  chart as items are completed):  Yes   Patient/family provided with [facility] Clinical Social Work Department's list of facilities offering this level of care within the geographic area requested by the patient (or if unable, by the patient's family).  Yes   Patient/family informed of their freedom to choose among providers that offer the needed level of care, that participate in Medicare, Medicaid or managed care program needed by the patient, have an available bed and are willing to accept the patient.  Yes   Patient/family informed of [place] [facility]'s ownership interest in [facility] and [facility], as well as of the fact that they are under no obligation to receive care at these facilities.  PASRR submitted to EDS on       PASRR number received on       Existing PASRR number confirmed on 02/21/17     FL2 transmitted to all facilities in geographic area requested by pt/family on 02/21/17     FL2 transmitted to all facilities within larger geographic area on       Patient informed that his/her managed care company has contracts with or will negotiate with certain facilities, including the following:        Yes   Patient/family informed of bed offers received.  [facility] chooses [facility]     Physician recommends and patient chooses bed at [facility]    Patient to be transferred to [facility] on 02/23/17.  Patient to be transferred to facility by [facility]     Patient family notified on 02/23/17 of transfer.  Name of family member notified:  daughter Coy SaunasRosemary      PHYSICIAN Please prepare priority discharge summary, including medications, Please prepare prescriptions     Additional Comment:    _______________________________________________ Nelwyn SalisburyMeghan R Colbey Wirtanen, LCSW 02/23/2017, 11:58 AM [phone]

## 2017-02-24 LAB — MISC LABCORP TEST (SEND OUT): Labcorp test code: 182220

## 2017-02-26 LAB — ANTIFUNGAL SUSCEP, FLUCONAZOLE

## 2017-02-27 LAB — URINE CULTURE: Culture: 20000 — AB

## 2017-03-06 NOTE — Progress Notes (Deleted)
Jack Carter was seen today in the movement disorders clinic for neurologic consultation at the request of [name], [name], DO.  The consultation is for the evaluation of Parkinsons.  Patient is accompanied by his son in law who supplements the history.  Pt previously taken care of in [place] (Dr. Daryel Gerald) but no records are available.  I have reviewed records from PCP.  Patient reports that he is diagnosed with Parkinson's disease in approximately 1-2 years ago.  His first symptom was shuffling of the feet and tremor.  The patient is currently on Stalevo 50, 1 tablet 4 times per day at 6 AM/10 AM/2 PM/6 PM. The patient is on Apokyn 30 mg/3  0.4 mL subcutaneous 5 times per day (son in law states that they d/c that 3 days ago because they felt that the patient was overdosing on the medication.  Felt that patient became mentally clearer when they d/c it) The patient is on amantadine 100 mg 3 times per day The patient is on Requip XL 4 mg 3 times per day The patient is on gabapentin 600 mg 4 times per day The patient is on propranolol 60 mg bid The patient is on primidone 50 mg bid  03/09/17 update: The patient is seen today in follow-up.  Much has happened since our last visit and I have reviewed an extensive number of records.  I have also talked with several physicians on the phone about him.  He was hospitalized for about a month after I last saw him.  He had septic shock.  He had an acute MI due to demand ischemia in the setting of poor renal function.  He had a VQ scan which demonstrated intermediate probability for pulmonary embolism.  He was intubated 3 times and subsequently a tracheostomy was placed on November 13, 2016.  [facility].  Patient has been in the ER many times, brought there by the family for agitation and potential suicidal ideation.  There have been family struggles in the home.  Social worker has contacted APS  regarding the situation with his family.  They felt that he was living in a hostile environment, but APS opted not to pursue the case.  The patient was back in the emergency room for the same.  He was not found to be suicidal or homicidal.  Behavior was felt to be attention seeking due to home situation.  He was discharged to [facility].  His Risperdal was discontinued.  From a movement disorder standpoint, the patient is currently on: Primidone, 50 mg twice per day. Carbidopa/levodopa 25/100, 1 tablet 3 times per day. Clonazepam, 0.5 mg 3 times per day Gabapentin, 600 mg twice per day. He is OFF of stalevo, requip, propranolol and amantadine (was on these last visit)  PREVIOUS MEDICATIONS: Amantadine and stalevo, requip, apokyn  ALLERGIES:  No Known Allergies  CURRENT MEDICATIONS:  Outpatient Encounter Medications as of 03/09/2017  Medication Sig  . albuterol (PROVENTIL) (2.5 MG/3ML) 0.083% nebulizer solution Take 3 mLs (2.5 mg total) by nebulization every 2 (two) hours as needed for wheezing or shortness of breath. (Patient not taking: Reported on 02/18/2017)  . alum & mag hydroxide-simeth (MYLANTA) 200-200-20 MG/5ML suspension Take 30 mLs by mouth every 6 (six) hours as needed for indigestion or heartburn.  Marland Kitchen aspirin EC 81 MG tablet Take 81 mg by mouth daily.  Marland Kitchen atorvastatin (LIPITOR) 10 MG tablet Take 1 tablet (10 mg  total) by mouth at bedtime.  . carbidopa-levodopa (SINEMET IR) 25-100 MG tablet Take 1 tablet by mouth 3 (three) times daily.  . citalopram (CELEXA) 10 MG tablet Take 1 tablet (10 mg total) by mouth daily.  . clonazePAM (KLONOPIN) 0.5 MG tablet Take 1 tablet (0.5 mg total) by mouth 3 (three) times daily.  . famotidine (PEPCID) 20 MG tablet Take 20 mg by mouth daily.  . fentaNYL (DURAGESIC - DOSED MCG/HR) 25 MCG/HR patch Place 1 patch (25 mcg total) onto the skin every 3 (three) days.  . furosemide (LASIX) 20 MG tablet Place 3 tablets (60 mg total) into feeding tube 2 (two)  times daily.  Marland Kitchen gabapentin (NEURONTIN) 600 MG tablet Take 1 tablet (600 mg total) by mouth 2 (two) times daily.  . metoprolol tartrate (LOPRESSOR) 25 MG tablet Place 1 tablet (25 mg total) into feeding tube 2 (two) times daily.  . polyethylene glycol (MIRALAX / GLYCOLAX) packet Take 17 g by mouth daily.  . primidone (MYSOLINE) 50 MG tablet Take 1 tablet (50 mg total) by mouth every 12 (twelve) hours.  . traMADol (ULTRAM) 50 MG tablet Take 1 tablet (50 mg total) by mouth 2 (two) times daily. 1 tablet at 9am and 1 tablet at 5pm daily   No facility-administered encounter medications on file as of 03/09/2017.     PAST MEDICAL HISTORY:   Past Medical History:  Diagnosis Date  . Cellulitis   . Coronary artery disease    CABG  . Essential hypertension   . Heart attack (HCC)   . Hyperlipidemia   . Hypertension   . Parkinson's disease (HCC)   . Restless leg syndrome     PAST SURGICAL HISTORY:   Past Surgical History:  Procedure Laterality Date  . APPENDECTOMY    . CARDIAC SURGERY  2000  . CARPAL TUNNEL RELEASE Left   . CORONARY ARTERY BYPASS GRAFT    . IR GASTROSTOMY TUBE MOD SED  12/10/2016  . STOMACH SURGERY  2003   intestinal fissure    SOCIAL HISTORY:   Social History   Socioeconomic History  . Marital status: Married    Spouse name: Not on file  . Number of children: Not on file  . Years of education: Not on file  . Highest education level: Not on file  Social Needs  . Financial resource strain: Not on file  . Food insecurity - worry: Not on file  . Food insecurity - inability: Not on file  . Transportation needs - medical: Not on file  . Transportation needs - non-medical: Not on file  Occupational History  . Not on file  Tobacco Use  . Smoking status: Former Games developer  . Smokeless tobacco: Never Used  Substance and Sexual Activity  . Alcohol use: No  . Drug use: No  . Sexual activity: No  Other Topics Concern  . Not on file  Social History Narrative   ** Merged  History Encounter **        FAMILY HISTORY:   Family Status  Relation Name Status  . Mother  Deceased  . Father  Deceased       unknown  . Sister  Alive  . Daughter  Alive    ROS:  A complete 10 system review of systems was obtained and was unremarkable apart from what is mentioned above.  PHYSICAL EXAMINATION:    VITALS:   There were no vitals filed for this visit.  GEN:  The patient appears stated age and  is in NAD. HEENT:  Normocephalic, atraumatic.  The mucous membranes are dry. The superficial temporal arteries are without ropiness or tenderness. CV:  RRR Lungs:  CTAB Neck/HEME:  There are no carotid bruits bilaterally. Skin:  Has evidence of cellulitis in the legs (on abx per son in law)  Neurological examination:  Orientation: The patient is alert and oriented x3. However, cannot recall what had for dinner last night.  Has good concentration.  Trouble with recall.  Looks to son in law for details of hx. Cranial nerves: There is good facial symmetry. Pupils are equal round and reactive to light bilaterally. Fundoscopic exam is attempted but the disc margins are not well visualized bilaterally. Extraocular muscles are intact. The visual fields are full to confrontational testing. The speech is fluent and slightly dysarthric.  He is hypophonic.  Soft palate rises symmetrically and there is no tongue deviation. Hearing is decreased to conversational tone. Sensation: Sensation is intact to light and pinprick throughout (facial, trunk, extremities). Vibration is intact at the bilateral big toe. There is no extinction with double simultaneous stimulation. There is no sensory dermatomal level identified. Motor: Strength is 5/5 in the bilateral upper and lower extremities.   Shoulder shrug is equal and symmetric.  There is no pronator drift. Deep tendon reflexes: Deep tendon reflexes are 2-/4 at the bilateral biceps, triceps, brachioradialis, patella and trace at the bilateral   achilles. Plantar responses is up on the right and neutral on the left  Movement examination: Tone: There is normal tone in the bilateral upper extremities.  The tone in the lower extremities is normal.  Abnormal movements: There is mild dyskinesia, especially in the legs Coordination:  There is  decremation with RAM's, with alternating supination and pronation of the forearm, hand opening and closing, finger taps, heel taps and toe taps on the L.  RAMs on the R are fairly good.  Mouth opens rhythmically when asked to do RAMs in arms/legs Gait and Station: The patient has  difficulty arising out of a deep-seated chair without the use of the hands and requires pushing off of the chair.  He uses a Rollator to ambulate.  Even with this, he has freezing and start hesitation.  He shuffles.  He drags the left leg.  He stops in doorways.  Labs: I got lab work from his primary care physician.  His sodium was 144, potassium 3.9, chloride 106, CO2 31, BUN 19 and creatinine 1.18.  His glucose was 95.  His white blood cells were 7.0, hemoglobin 13.2, hematocrit 40.6 and platelets were low at 126.  ASSESSMENT/PLAN:  1.  Parkinsonism.    -We discussed the diagnosis as well as pathophysiology of the disease.  We discussed treatment options as well as prognostic indicators.  Patient education was provided.  -We discussed that it used to be thought that levodopa would increase risk of melanoma but now it is believed that Parkinsons itself likely increases risk of melanoma. he is to get regular skin checks.  -The patient was referred for home physical, occupational and speech therapy.  He is to use his walker at all times.  -We discussed community resources in the area including patient support groups and community exercise programs for PD and pt education was provided to the patient.  -Patient has had multiple life changes and many changes to his medications since our last visit.  He is now residing in a subacute  nursing facility.  Hopefully, this will actually help since he will get  his medications more regularly.  He has only been there since Christmas.  -***Carbidopa/levodopa 25/100 ***  -***I will discontinue his primidone, 50 mg twice per day.  2.  ***This did not include the 40 min of record review which was detailed above, which was non face to face time.   Cc:  [name], [name], DO

## 2017-03-09 ENCOUNTER — Ambulatory Visit: Payer: Medicare Other | Admitting: Neurology

## 2017-03-13 ENCOUNTER — Other Ambulatory Visit (HOSPITAL_COMMUNITY): Payer: Self-pay | Admitting: Family Medicine

## 2017-03-13 DIAGNOSIS — R1310 Dysphagia, unspecified: Secondary | ICD-10-CM

## 2017-03-18 ENCOUNTER — Ambulatory Visit (HOSPITAL_COMMUNITY): Payer: Medicare Other

## 2017-03-18 ENCOUNTER — Ambulatory Visit (HOSPITAL_COMMUNITY)
Admission: RE | Admit: 2017-03-18 | Discharge: 2017-03-18 | Disposition: A | Discharge status: Home / Self Care | Payer: Medicare Other | Source: Ambulatory Visit | Attending: Family Medicine | Admitting: Family Medicine

## 2017-04-01 ENCOUNTER — Ambulatory Visit (HOSPITAL_COMMUNITY)
Admission: RE | Admit: 2017-04-01 | Discharge: 2017-04-01 | Disposition: A | Discharge status: Home / Self Care | Payer: No Typology Code available for payment source | Source: Ambulatory Visit | Attending: Family Medicine | Admitting: Family Medicine

## 2017-04-01 DIAGNOSIS — R1310 Dysphagia, unspecified: Secondary | ICD-10-CM | POA: Diagnosis present

## 2017-04-14 ENCOUNTER — Other Ambulatory Visit (HOSPITAL_COMMUNITY): Payer: Self-pay | Admitting: Family Medicine

## 2017-04-14 DIAGNOSIS — Z431 Encounter for attention to gastrostomy: Secondary | ICD-10-CM

## 2017-04-20 ENCOUNTER — Encounter (HOSPITAL_COMMUNITY): Payer: Self-pay | Admitting: Interventional Radiology

## 2017-04-20 ENCOUNTER — Ambulatory Visit (HOSPITAL_COMMUNITY)
Admission: RE | Admit: 2017-04-20 | Discharge: 2017-04-20 | Disposition: A | Discharge status: Home / Self Care | Payer: Medicare Other | Source: Ambulatory Visit | Attending: Family Medicine | Admitting: Family Medicine

## 2017-04-20 DIAGNOSIS — Z431 Encounter for attention to gastrostomy: Secondary | ICD-10-CM | POA: Diagnosis present

## 2017-04-20 HISTORY — PX: IR GASTROSTOMY TUBE REMOVAL: IMG5492

## 2017-04-20 MED ORDER — LIDOCAINE VISCOUS 2 % MT SOLN
OROMUCOSAL | Status: AC
  Administered 2017-04-20: 15 mL
  Filled 2017-04-20: qty 15

## 2017-04-20 NOTE — Procedures (Signed)
Successful bedside removal of intact pull through G-tube. No immediate post procedural complications.  Jay Anibal Quinby, MD Pager #: [phone]  

## 2017-07-16 ENCOUNTER — Telehealth: Payer: Self-pay | Admitting: Neurology

## 2017-07-16 ENCOUNTER — Encounter: Payer: Self-pay | Admitting: Neurology

## 2017-07-16 ENCOUNTER — Ambulatory Visit (INDEPENDENT_AMBULATORY_CARE_PROVIDER_SITE_OTHER): Payer: Medicare Other | Admitting: Neurology

## 2017-07-16 VITALS — BP 136/55 | HR 52 | Ht 69.0 in | Wt 147.0 lb

## 2017-07-16 DIAGNOSIS — G3281 Cerebellar ataxia in diseases classified elsewhere: Secondary | ICD-10-CM

## 2017-07-16 DIAGNOSIS — G20 Parkinson's disease: Secondary | ICD-10-CM | POA: Diagnosis not present

## 2017-07-16 DIAGNOSIS — G20C Parkinsonism, unspecified: Secondary | ICD-10-CM | POA: Insufficient documentation

## 2017-07-16 DIAGNOSIS — G249 Dystonia, unspecified: Secondary | ICD-10-CM

## 2017-07-16 DIAGNOSIS — R269 Unspecified abnormalities of gait and mobility: Secondary | ICD-10-CM | POA: Diagnosis not present

## 2017-07-16 DIAGNOSIS — G248 Other dystonia: Secondary | ICD-10-CM | POA: Insufficient documentation

## 2017-07-16 MED ORDER — CARBIDOPA-LEVODOPA 25-100 MG PO TABS: 2.0000 | ORAL_TABLET | Freq: Three times a day (TID) | ORAL | 11 refills | Status: AC

## 2017-07-16 NOTE — Telephone Encounter (Signed)
Patient was seen by Dr. Terrace Arabia and was told to be given a 1 month follow up. Dr. Zannie Cove first available was August 1st at 1:30, so the patient was scheduled for that appointment and placed on the wait list.

## 2017-07-16 NOTE — Progress Notes (Signed)
PATIENT: Jack Carter DOB: 26-Nov-1933  Chief Complaint  Patient presents with  . New Patient (Initial Visit)    PCP: Dr. Karlene Carter.Patient is alone.      HISTORICAL  Jack Carter is a 84 year old male, was dropped off by his facility transportation, alone at visit, seen in refer by his primary care doctor Jack Carter for evaluation of Botox injection for spasticity of bilateral foot, initial evaluation was on Jul 16, 2017.  He is currently a resident at [facility]  I reviewed and summarized the referring note and the multiple previous hospital admission and discharge note, he was recently moved from [place] to [place] to be close to his daughter, who is his only child.  He has past medical history of hyperlipidemia, congestive heart failure, Parkinson's disease, dysphagia, on regular diet with thin liquids, he was treated at the hospital on October 31, 2017 with septic shock,  respiratory failure required prolonged intubation, he was treated with multiple antibiotic, aggressive fluid resuscitation, vasopressor, he developed acute kidney failure, this is also a complicated by aspiration pneumonia, acute pulmonary edema, NSTEMI, he is not a candidate for invasive cardiac treatment, VQ scan on September 11 showed findings intermittent probability for pulmonary emboli, he was treated heparin drip, he become vent dependent, tracheostomy was placed on November 13, 2017, [facility],  Now he lives at [facility], no longer has tracheostomy, eat by mouth, but wheelchair dependent, has difficulty walking, patient has been taking Sinemet 25/100 mg 3 times a day, also on polypharmacy treatment, fentanyl patch, tramadol 50 mg twice a day, Lasix, metoprolol, atorvastatin, citalopram,  He was able to  ambulate prior to the event,now he has developed significant right ankle plantar dystonia,  painful with passive movement, increased gait abnormality.  He is referred for EMG guided botulism toxin injection for right foot dystonia  I personally reviewed CT head in December 2018, no acute intracranial abnormality, moderate to severe atrophy, with mild supratentorium chronic small vessel disease.  CT of cervical spine showed multilevel degenerative changes, due to severe right C3-4, C5-6 neural foraminal narrowing,  Laboratory evaluations in December 2018 showed anemia hemoglobin of 10.2, CMP showed creatinine of 1.5, low sodium of 134,  REVIEW OF SYSTEMS: Full 14 system review of systems performed and notable only for chest pain, shortness of breath, cough, wheezing, incontinence, easy bruising, easy bleeding, numbness, weakness  ALLERGIES: No Known Allergies  HOME MEDICATIONS: Current Outpatient Medications  Medication Sig Dispense Refill  . aspirin EC 81 MG tablet Take 81 mg by mouth daily.    Marland Kitchen atorvastatin (LIPITOR) 10 MG tablet Take 1 tablet (10 mg total) by mouth at bedtime.    . carbidopa-levodopa (SINEMET IR) 25-100 MG tablet Take 1 tablet by mouth 3 (three) times daily.    . citalopram (CELEXA) 10 MG tablet Take 1 tablet (10 mg total) by mouth daily. 30 tablet 0  . clonazePAM (KLONOPIN) 0.5 MG tablet Take 1 tablet (0.5 mg total) by mouth 3 (three) times daily. 15 tablet 0  . famotidine (PEPCID) 20 MG tablet Take 20 mg by mouth daily.    . fentaNYL (DURAGESIC - DOSED MCG/HR) 25 MCG/HR patch Place 1 patch (25 mcg total) onto the skin every 3 (three) days. 3 patch 0  . furosemide (LASIX) 20 MG tablet Place 3 tablets (60 mg total) into feeding tube 2 (two) times daily. 30 tablet   . gabapentin (NEURONTIN)  600 MG tablet Take 1 tablet (600 mg total) by mouth 2 (two) times daily. 60 tablet 0  . metoprolol tartrate (LOPRESSOR) 25 MG tablet Place 1 tablet (25 mg total) into feeding tube 2 (two) times daily.    . polyethylene glycol (MIRALAX / GLYCOLAX) packet Take 17 g by mouth  daily. 14 each 0  . primidone (MYSOLINE) 50 MG tablet Take 1 tablet (50 mg total) by mouth every 12 (twelve) hours.    . traMADol (ULTRAM) 50 MG tablet Take 1 tablet (50 mg total) by mouth 2 (two) times daily. 1 tablet at 9am and 1 tablet at 5pm daily 6 tablet 0   No current facility-administered medications for this visit.     PAST MEDICAL HISTORY: Past Medical History:  Diagnosis Date  . Cellulitis   . Coronary artery disease    CABG  . Essential hypertension   . Heart attack (HCC)   . Hyperlipidemia   . Hypertension   . Parkinson's disease (HCC)   . Restless leg syndrome     PAST SURGICAL HISTORY: Past Surgical History:  Procedure Laterality Date  . APPENDECTOMY    . CARDIAC SURGERY  2000  . CARPAL TUNNEL RELEASE Left   . CORONARY ARTERY BYPASS GRAFT    . IR GASTROSTOMY TUBE MOD SED  12/10/2016  . IR GASTROSTOMY TUBE REMOVAL  04/20/2017  . STOMACH SURGERY  2003   intestinal fissure    FAMILY HISTORY: Family History  Problem Relation Age of Onset  . Heart disease Mother   . Cancer Mother   . Pneumonia Father     SOCIAL HISTORY:  Social History   Socioeconomic History  . Marital status: Married    Spouse name: Not on file  . Number of children: Not on file  . Years of education: Not on file  . Highest education level: Not on file  Occupational History  . Not on file  Social Needs  . Financial resource strain: Not on file  . Food insecurity:    Worry: Not on file    Inability: Not on file  . Transportation needs:    Medical: Not on file    Non-medical: Not on file  Tobacco Use  . Smoking status: Former Games developer  . Smokeless tobacco: Never Used  Substance and Sexual Activity  . Alcohol use: No  . Drug use: No  . Sexual activity: Never  Lifestyle  . Physical activity:    Days per week: Not on file    Minutes per session: Not on file  . Stress: Not on file  Relationships  . Social connections:    Talks on phone: Not on file    Gets together: Not  on file    Attends religious service: Not on file    Active member of club or organization: Not on file    Attends meetings of clubs or organizations: Not on file    Relationship status: Not on file  . Intimate partner violence:    Fear of current or ex partner: Not on file    Emotionally abused: Not on file    Physically abused: Not on file    Forced sexual activity: Not on file  Other Topics Concern  . Not on file  Social History Narrative   ** Merged History Encounter **         PHYSICAL EXAM   Vitals:   07/16/17 1336  BP: (!) 136/55  Pulse: (!) 52  Weight: 147 lb (66.7 kg)  Height:  (1.753 m)    Not recorded      Body mass index is 21.71 kg/m.  PHYSICAL EXAMNIATION:  Gen: NAD, conversant, well nourised, obese, well groomed                     Cardiovascular: Regular rate rhythm, no peripheral edema, warm, nontender. Eyes: Conjunctivae clear without exudates or hemorrhage Neck: Supple, no carotid bruits. Pulmonary: Clear to auscultation bilaterally   NEUROLOGICAL EXAM: He was sleeping, but easily to be awakened, mildly low soft voice,  MMSE - Mini Mental State Exam 07/17/2017  Orientation to time 4  Orientation to Place 3  Registration 3  Attention/ Calculation 5  Recall 3  Language- name 2 objects 2  Language- repeat 1  Language- follow 3 step command 3  Language- read & follow direction 1  Write a sentence 1  Copy design 1  Total score 27   CRANIAL NERVES: CN II: Visual fields are full to confrontation. Fundoscopic exam is normal with sharp discs and no vascular changes. Pupils are round equal and briskly reactive to light. CN III, IV, VI: extraocular movement are normal. No ptosis. CN V: Facial sensation is intact to pinprick in all 3 divisions bilaterally. Corneal responses are intact.  CN VII: Face is symmetric with normal eye closure and smile. CN VIII: Hearing is normal to rubbing fingers CN IX, X: Palate elevates symmetrically. Phonation  is normal. CN XI: Head turning and shoulder shrug are intact CN XII: Tongue is midline with normal movements and no atrophy.  MOTOR: Mild bilateral upper and lower extremity bradykinesia, rigidity with passive movement, right ankle dorsiflexion, dystonic posturing, maximum 90 degree of right foot, limited right foot eversion,   REFLEXES: Reflexes are 2+ and symmetric at the biceps, triceps, knees, and ankles. Plantar responses are flexor.  SENSORY: Intact to light touch, pinprick, positional sensation and vibratory sensation are intact in fingers and toes.  COORDINATION: Rapid alternating movements and fine finger movements are intact. There is no dysmetria on finger-to-nose and heel-knee-shin.    GAIT/STANCE: He needs 2 people assist him to get up from seated position, difficulty initiating gait, with forceful right ankle plantarflexion,   DIAGNOSTIC DATA (LABS, IMAGING, TESTING) - I reviewed patient records, labs, notes, testing and imaging myself where available.   ASSESSMENT AND PLAN  Jack Carter is a 84 y.o. male   Increased gait abnormality, Right lower extremity dystonia History of Parkinson's disease,  MRI of brain for potential left hemisphere pathology  Increase Sinemet to 25/100 mg 2 tablets 3 times a day  PT OT  Preauthorization for EMG guided xeomin injection, ask for 500 units.  Levert Feinstein, M.D. Ph.D.  [facility] [address], [address] [place], [place] [address] Ph: [phone] Fax: [phone]  CC: Jack Cox, MD

## 2017-07-16 NOTE — Telephone Encounter (Signed)
Medicare/aarp/va order sent to [facility]. No auth they will reach out to the pt to schedule.

## 2017-07-23 NOTE — Telephone Encounter (Signed)
I called to schedule the patient but he did not answer so I left a VM asking him to call me back.  °

## 2017-08-20 ENCOUNTER — Ambulatory Visit
Admission: RE | Admit: 2017-08-20 | Discharge: 2017-08-20 | Disposition: A | Discharge status: Home / Self Care | Payer: Medicare Other | Source: Ambulatory Visit | Attending: Neurology | Admitting: Neurology

## 2017-08-20 DIAGNOSIS — G3281 Cerebellar ataxia in diseases classified elsewhere: Secondary | ICD-10-CM

## 2017-08-21 ENCOUNTER — Telehealth: Payer: Self-pay | Admitting: Neurology

## 2017-08-21 NOTE — Telephone Encounter (Signed)
Please call patient, MRI of the brain showed moderate generalized atrophy, periventricular small vessel disease, no acute abnormality, I will review films at his next follow-up visit  IMPRESSION: This MRI of the brain without contrast shows the following: 1.    Moderate generalized cortical atrophy.  This appears stable compared to the 02/18/2017 CT scan. 2.    Right frontal periventricular white matter hyperintensity most consistent with chronic ischemic changes. 3.    Chronic right maxillary sinusitis  4.    There are no acute findings.

## 2017-08-24 NOTE — Telephone Encounter (Signed)
Spoke to his daughter ([place]) - she is aware of results and will discuss with her father.  He has a pending appt on 09/22/17 for further review of findings.

## 2017-08-24 NOTE — Telephone Encounter (Signed)
Left message requesting a return call from the patient. 

## 2017-08-25 NOTE — Telephone Encounter (Signed)
Left another message requesting a call back from the patient.  If he calls back, we are happy to review the results below.  If not, then Dr. Terrace ArabiaYan plans to further review the MRI with him at his follow up on 09/22/17.

## 2017-09-22 ENCOUNTER — Ambulatory Visit (INDEPENDENT_AMBULATORY_CARE_PROVIDER_SITE_OTHER): Payer: Medicare Other | Admitting: Neurology

## 2017-09-22 ENCOUNTER — Encounter

## 2017-09-22 ENCOUNTER — Encounter: Payer: Self-pay | Admitting: Neurology

## 2017-09-22 ENCOUNTER — Encounter: Payer: Self-pay | Admitting: *Deleted

## 2017-09-22 VITALS — BP 135/61 | HR 64

## 2017-09-22 DIAGNOSIS — G248 Other dystonia: Secondary | ICD-10-CM

## 2017-09-22 DIAGNOSIS — G20 Parkinson's disease: Secondary | ICD-10-CM

## 2017-09-22 MED ORDER — INCOBOTULINUMTOXINA 100 UNITS IM SOLR
500.0000 [IU] | INTRAMUSCULAR | Status: AC
  Administered 2017-09-22: 500 [IU] via INTRAMUSCULAR

## 2017-09-22 NOTE — Progress Notes (Signed)
**  Xeomin 100 units x 5 vials, NDC 0259-1610-01, Lot [id number], Exp 11/2019, office supply.//mck,rn** 

## 2017-09-22 NOTE — Progress Notes (Signed)
PATIENT: Jack Carter DOB: 1933/07/19  Chief Complaint  Patient presents with  . Dystonia    Xeomin 100 units x 5 vials - office supply.  1st injections.     HISTORICAL  Jack Carter is a 84 year old male, was dropped off by his facility transportation, alone at visit, seen in refer by his primary care doctor [name] for evaluation of Botox injection for spasticity of bilateral foot, initial evaluation was on Jul 16, 2017.  He is currently a resident at [facility]  I reviewed and summarized the referring note and the multiple previous hospital admission and discharge note, he was recently moved from [place] to [place] to be close to his daughter, who is his only child.  He has past medical history of hyperlipidemia, congestive heart failure, Parkinson's disease, dysphagia, on regular diet with thin liquids, he was treated at the hospital on October 31, 2017 with septic shock,  respiratory failure required prolonged intubation, he was treated with multiple antibiotic, aggressive fluid resuscitation, vasopressor, he developed acute kidney failure, this is also a complicated by aspiration pneumonia, acute pulmonary edema, NSTEMI, he is not a candidate for invasive cardiac treatment, VQ scan on September 11 showed findings intermittent probability for pulmonary emboli, he was treated heparin drip, he become vent dependent, tracheostomy was placed on November 13, 2017, [facility],  Now he lives at [facility], no longer has tracheostomy, eat by mouth, but wheelchair dependent, has difficulty walking, patient has been taking Sinemet 25/100 mg 3 times a day, also on polypharmacy treatment, fentanyl patch, tramadol 50 mg twice a day, Lasix, metoprolol, atorvastatin, citalopram,  He was able to  ambulate prior to the event,now he has developed significant right ankle plantar dystonia,  painful with passive movement, increased gait abnormality.  He is referred for EMG guided botulism toxin injection for right foot dystonia  I personally reviewed CT head in December 2018, no acute intracranial abnormality, moderate to severe atrophy, with mild supratentorium chronic small vessel disease.  CT of cervical spine showed multilevel degenerative changes, due to severe right C3-4, C5-6 neural foraminal narrowing,  Laboratory evaluations in December 2018 showed anemia hemoglobin of 10.2, CMP showed creatinine of 1.5, low sodium of 134,  UPDATE September 22 2017: He is accompanied by [facility] staff member at today's visit following his first electrical stimulation guided  xeomin injection for bilateral lower extremity pain, dystonic posturing,  REVIEW OF SYSTEMS: Full 14 system review of systems performed and notable only for as above ALLERGIES: No Known Allergies  HOME MEDICATIONS: Current Outpatient Medications  Medication Sig Dispense Refill  . acetaminophen (TYLENOL) 500 MG tablet Take 1,000 mg by mouth 3 (three) times daily as needed.    Marland Kitchen aspirin EC 81 MG tablet Take 81 mg by mouth daily.    Marland Kitchen atorvastatin (LIPITOR) 10 MG tablet Take 1 tablet (10 mg total) by mouth at bedtime.    . carbidopa-levodopa (SINEMET IR) 25-100 MG tablet Take 2 tablets by mouth 3 (three) times daily. At 8am, 12, 5pm. 180 tablet 11  . citalopram (CELEXA) 10 MG tablet Take 1 tablet (10 mg total) by mouth daily. 30 tablet 0  . clonazePAM (KLONOPIN) 0.5 MG tablet Take 1 tablet (0.5 mg total) by mouth 3 (three) times daily. 15 tablet 0  . Cranberry 450 MG CAPS Take 1 capsule by mouth daily.    . famotidine (PEPCID) 20 MG tablet Take 20  mg by mouth daily.    . fentaNYL (DURAGESIC - DOSED MCG/HR) 25 MCG/HR patch Place 1 patch (25 mcg total) onto the skin every 3 (three) days. 3 patch 0  . furosemide (LASIX) 20 MG tablet Place 3 tablets (60 mg total) into feeding tube 2 (two) times daily. 30 tablet   .  gabapentin (NEURONTIN) 600 MG tablet Take 1 tablet (600 mg total) by mouth 2 (two) times daily. 60 tablet 0  . metoprolol tartrate (LOPRESSOR) 25 MG tablet Place 1 tablet (25 mg total) into feeding tube 2 (two) times daily.    . polyethylene glycol (MIRALAX / GLYCOLAX) packet Take 17 g by mouth daily. 14 each 0  . primidone (MYSOLINE) 50 MG tablet Take 1 tablet (50 mg total) by mouth every 12 (twelve) hours.    . traMADol (ULTRAM) 50 MG tablet Take 1 tablet (50 mg total) by mouth 2 (two) times daily. 1 tablet at 9am and 1 tablet at 5pm daily 6 tablet 0   No current facility-administered medications for this visit.     PAST MEDICAL HISTORY: Past Medical History:  Diagnosis Date  . Cellulitis   . Coronary artery disease    CABG  . Essential hypertension   . Heart attack (HCC)   . Hyperlipidemia   . Hypertension   . Parkinson's disease (HCC)   . Restless leg syndrome     PAST SURGICAL HISTORY: Past Surgical History:  Procedure Laterality Date  . APPENDECTOMY    . CARDIAC SURGERY  2000  . CARPAL TUNNEL RELEASE Left   . CORONARY ARTERY BYPASS GRAFT    . IR GASTROSTOMY TUBE MOD SED  12/10/2016  . IR GASTROSTOMY TUBE REMOVAL  04/20/2017  . STOMACH SURGERY  2003   intestinal fissure    FAMILY HISTORY: Family History  Problem Relation Age of Onset  . Heart disease Mother   . Cancer Mother   . Pneumonia Father     SOCIAL HISTORY:  Social History   Socioeconomic History  . Marital status: Married    Spouse name: Not on file  . Number of children: Not on file  . Years of education: Not on file  . Highest education level: Not on file  Occupational History  . Not on file  Social Needs  . Financial resource strain: Not on file  . Food insecurity:    Worry: Not on file    Inability: Not on file  . Transportation needs:    Medical: Not on file    Non-medical: Not on file  Tobacco Use  . Smoking status: Former Games developer  . Smokeless tobacco: Never Used  Substance and  Sexual Activity  . Alcohol use: No  . Drug use: No  . Sexual activity: Never  Lifestyle  . Physical activity:    Days per week: Not on file    Minutes per session: Not on file  . Stress: Not on file  Relationships  . Social connections:    Talks on phone: Not on file    Gets together: Not on file    Attends religious service: Not on file    Active member of club or organization: Not on file    Attends meetings of clubs or organizations: Not on file    Relationship status: Not on file  . Intimate partner violence:    Fear of current or ex partner: Not on file    Emotionally abused: Not on file    Physically abused: Not on file  Forced sexual activity: Not on file  Other Topics Concern  . Not on file  Social History Narrative   ** Merged History Encounter **         PHYSICAL EXAM   Vitals:   09/22/17 1535  BP: 135/61  Pulse: 64    Not recorded      There is no height or weight on file to calculate BMI.  PHYSICAL EXAMNIATION:  Gen: NAD, conversant, well nourised, obese, well groomed                     Cardiovascular: Regular rate rhythm, no peripheral edema, warm, nontender. Eyes: Conjunctivae clear without exudates or hemorrhage Neck: Supple, no carotid bruits. Pulmonary: Clear to auscultation bilaterally   NEUROLOGICAL EXAM: He was sleeping, but easily to be awakened, mildly low soft voice,  MMSE - Mini Mental State Exam 07/17/2017  Orientation to time 4  Orientation to Place 3  Registration 3  Attention/ Calculation 5  Recall 3  Language- name 2 objects 2  Language- repeat 1  Language- follow 3 step command 3  Language- read & follow direction 1  Write a sentence 1  Copy design 1  Total score 27   CRANIAL NERVES: CN II: Visual fields are full to confrontation. Fundoscopic exam is normal with sharp discs and no vascular changes. Pupils are round equal and briskly reactive to light. CN III, IV, VI: extraocular movement are normal. No ptosis. CN  V: Facial sensation is intact to pinprick in all 3 divisions bilaterally. Corneal responses are intact.  CN VII: Face is symmetric with normal eye closure and smile. CN VIII: Hearing is normal to rubbing fingers CN IX, X: Palate elevates symmetrically. Phonation is normal. CN XI: Head turning and shoulder shrug are intact CN XII: Tongue is midline with normal movements and no atrophy.  MOTOR: Mild bilateral upper and lower extremity bradykinesia, rigidity with passive movement, right ankle fixed plantar flexion, dystonic posturing, maximum 90 degree of right foot, limited right foot eversion, he also has fixed contraction of bilateral knee, maximum right knee extension 160, left knee extension 150 degree, left ankle tends to stay at 90 degrees, limited range of motion to left ankle dorsi and plantar flexion, bilateral lower extremity pitting edema   REFLEXES: Reflexes are 2+ and symmetric at the biceps, triceps, knees, and ankles. Plantar responses are flexor.  SENSORY: Intact to light touch, pinprick, positional sensation and vibratory sensation are intact in fingers and toes.  COORDINATION: Rapid alternating movements and fine finger movements are intact. There is no dysmetria on finger-to-nose and heel-knee-shin.    GAIT/STANCE: He needs 2 people assist him to get up from seated position, difficulty initiating gait, with forceful right ankle plantarflexion,   DIAGNOSTIC DATA (LABS, IMAGING, TESTING) - I reviewed patient records, labs, notes, testing and imaging myself where available.   ASSESSMENT AND PLAN  Jack Carter is a 84 y.o. male   Increased gait abnormality, Right lower extremity dystonia History of Parkinson's disease,  MRI of brain on August 20, 2017 showed moderate generalized atrophy, right frontal periventricular white matter hyperintensity consistent with chronic ischemic changes  keep Sinemet to 25/100 mg 2 tablets 3 times a day   First electrical  stimulation guided xeomin injection for bilateral lower extremity dystonic posturing, initial injection was on September 22, 2017, we used xeomin 500 units total.  Right medial gastrocnemius 100 units Right tibialis posterior 50 units  Right adductor longus 100 units Right adductor magnus  100 units  Left medial gastrocnemius 50 units Left adductor longus 50 units Left adductor magnus 50 units  Patient had significant bilateral distal leg pitting edema, I have advised passive stretching of his lower extremity, he will only return to clinic for repeat injection if he show any signs of improvement   Levert FeinsteinYijun Shamond Skelton, M.D. Ph.D.  [facility] [address], [address] [place], [place] [address] Ph: [phone] Fax: [phone]  CC: Angela Coxasanayaka, Gayani Y, MD

## 2017-10-01 ENCOUNTER — Encounter

## 2017-10-01 ENCOUNTER — Ambulatory Visit: Payer: Medicare Other | Admitting: Neurology

## 2017-10-07 ENCOUNTER — Inpatient Hospital Stay (HOSPITAL_COMMUNITY)
Admission: EM | Admit: 2017-10-07 | Discharge: 2017-10-14 | DRG: 871 | Disposition: A | Discharge status: Skilled Nursing Facility | Payer: Medicare Other | Attending: Internal Medicine | Admitting: Internal Medicine

## 2017-10-07 ENCOUNTER — Emergency Department (HOSPITAL_COMMUNITY): Payer: Medicare Other

## 2017-10-07 ENCOUNTER — Other Ambulatory Visit: Payer: Self-pay

## 2017-10-07 ENCOUNTER — Encounter (HOSPITAL_COMMUNITY): Payer: Self-pay

## 2017-10-07 DIAGNOSIS — G2581 Restless legs syndrome: Secondary | ICD-10-CM | POA: Diagnosis present

## 2017-10-07 DIAGNOSIS — L03115 Cellulitis of right lower limb: Secondary | ICD-10-CM | POA: Diagnosis present

## 2017-10-07 DIAGNOSIS — A419 Sepsis, unspecified organism: Secondary | ICD-10-CM | POA: Diagnosis not present

## 2017-10-07 DIAGNOSIS — R319 Hematuria, unspecified: Secondary | ICD-10-CM | POA: Diagnosis not present

## 2017-10-07 DIAGNOSIS — Z1612 Extended spectrum beta lactamase (ESBL) resistance: Secondary | ICD-10-CM | POA: Diagnosis present

## 2017-10-07 DIAGNOSIS — I252 Old myocardial infarction: Secondary | ICD-10-CM

## 2017-10-07 DIAGNOSIS — G9341 Metabolic encephalopathy: Secondary | ICD-10-CM | POA: Diagnosis present

## 2017-10-07 DIAGNOSIS — R1310 Dysphagia, unspecified: Secondary | ICD-10-CM | POA: Diagnosis present

## 2017-10-07 DIAGNOSIS — Z79899 Other long term (current) drug therapy: Secondary | ICD-10-CM

## 2017-10-07 DIAGNOSIS — E785 Hyperlipidemia, unspecified: Secondary | ICD-10-CM | POA: Diagnosis present

## 2017-10-07 DIAGNOSIS — Z951 Presence of aortocoronary bypass graft: Secondary | ICD-10-CM

## 2017-10-07 DIAGNOSIS — I5042 Chronic combined systolic (congestive) and diastolic (congestive) heart failure: Secondary | ICD-10-CM

## 2017-10-07 DIAGNOSIS — N390 Urinary tract infection, site not specified: Secondary | ICD-10-CM | POA: Diagnosis present

## 2017-10-07 DIAGNOSIS — I2510 Atherosclerotic heart disease of native coronary artery without angina pectoris: Secondary | ICD-10-CM | POA: Diagnosis present

## 2017-10-07 DIAGNOSIS — B9689 Other specified bacterial agents as the cause of diseases classified elsewhere: Secondary | ICD-10-CM | POA: Diagnosis present

## 2017-10-07 DIAGNOSIS — M2450 Contracture, unspecified joint: Secondary | ICD-10-CM | POA: Diagnosis present

## 2017-10-07 DIAGNOSIS — Z993 Dependence on wheelchair: Secondary | ICD-10-CM

## 2017-10-07 DIAGNOSIS — A4159 Other Gram-negative sepsis: Secondary | ICD-10-CM | POA: Diagnosis not present

## 2017-10-07 DIAGNOSIS — Z87891 Personal history of nicotine dependence: Secondary | ICD-10-CM

## 2017-10-07 DIAGNOSIS — Z515 Encounter for palliative care: Secondary | ICD-10-CM | POA: Diagnosis not present

## 2017-10-07 DIAGNOSIS — I130 Hypertensive heart and chronic kidney disease with heart failure and stage 1 through stage 4 chronic kidney disease, or unspecified chronic kidney disease: Secondary | ICD-10-CM | POA: Diagnosis present

## 2017-10-07 DIAGNOSIS — I10 Essential (primary) hypertension: Secondary | ICD-10-CM

## 2017-10-07 DIAGNOSIS — G20 Parkinson's disease: Secondary | ICD-10-CM | POA: Diagnosis present

## 2017-10-07 DIAGNOSIS — Z66 Do not resuscitate: Secondary | ICD-10-CM | POA: Diagnosis present

## 2017-10-07 DIAGNOSIS — N179 Acute kidney failure, unspecified: Secondary | ICD-10-CM | POA: Diagnosis present

## 2017-10-07 DIAGNOSIS — N183 Chronic kidney disease, stage 3 (moderate): Secondary | ICD-10-CM | POA: Diagnosis present

## 2017-10-07 DIAGNOSIS — B961 Klebsiella pneumoniae [K. pneumoniae] as the cause of diseases classified elsewhere: Secondary | ICD-10-CM

## 2017-10-07 LAB — CBC WITH DIFFERENTIAL/PLATELET
BASOS ABS: 0 10*3/uL (ref 0.0–0.1)
Basophils Relative: 0 %
Eosinophils Absolute: 0 10*3/uL (ref 0.0–0.7)
Eosinophils Relative: 0 %
HEMATOCRIT: 33.4 % — AB (ref 39.0–52.0)
Hemoglobin: 10.7 g/dL — ABNORMAL LOW (ref 13.0–17.0)
LYMPHS PCT: 9 %
Lymphs Abs: 1 10*3/uL (ref 0.7–4.0)
MCH: 31.1 pg (ref 26.0–34.0)
MCHC: 32 g/dL (ref 30.0–36.0)
MCV: 97.1 fL (ref 78.0–100.0)
MONO ABS: 1.4 10*3/uL — AB (ref 0.1–1.0)
MONOS PCT: 13 %
NEUTROS ABS: 8.3 10*3/uL — AB (ref 1.7–7.7)
Neutrophils Relative %: 78 %
Platelets: 122 10*3/uL — ABNORMAL LOW (ref 150–400)
RBC: 3.44 MIL/uL — ABNORMAL LOW (ref 4.22–5.81)
RDW: 14.4 % (ref 11.5–15.5)
WBC: 10.7 10*3/uL — ABNORMAL HIGH (ref 4.0–10.5)

## 2017-10-07 LAB — COMPREHENSIVE METABOLIC PANEL
ALBUMIN: 3.4 g/dL — AB (ref 3.5–5.0)
ALT: 11 U/L (ref 0–44)
AST: 26 U/L (ref 15–41)
Alkaline Phosphatase: 122 U/L (ref 38–126)
Anion gap: 14 (ref 5–15)
BUN: 39 mg/dL — AB (ref 8–23)
CHLORIDE: 101 mmol/L (ref 98–111)
CO2: 27 mmol/L (ref 22–32)
Calcium: 9 mg/dL (ref 8.9–10.3)
Creatinine, Ser: 1.91 mg/dL — ABNORMAL HIGH (ref 0.61–1.24)
GFR calc Af Amer: 36 mL/min — ABNORMAL LOW (ref >60)
GFR calc non Af Amer: 31 mL/min — ABNORMAL LOW (ref >60)
GLUCOSE: 141 mg/dL — AB (ref 70–99)
POTASSIUM: 4 mmol/L (ref 3.5–5.1)
Sodium: 142 mmol/L (ref 135–145)
Total Bilirubin: 1.1 mg/dL (ref 0.3–1.2)
Total Protein: 7.7 g/dL (ref 6.5–8.1)

## 2017-10-07 LAB — URINALYSIS, ROUTINE W REFLEX MICROSCOPIC
Bilirubin Urine: NEGATIVE
Glucose, UA: NEGATIVE mg/dL
Ketones, ur: NEGATIVE mg/dL
Nitrite: NEGATIVE
PROTEIN: NEGATIVE mg/dL
Specific Gravity, Urine: 1.016 (ref 1.005–1.030)
pH: 6 (ref 5.0–8.0)

## 2017-10-07 LAB — PROTIME-INR
INR: 1.31
Prothrombin Time: 16.2 seconds — ABNORMAL HIGH (ref 11.4–15.2)

## 2017-10-07 LAB — I-STAT CG4 LACTIC ACID, ED
LACTIC ACID, VENOUS: 1.32 mmol/L (ref 0.5–1.9)
Lactic Acid, Venous: 1.91 mmol/L — ABNORMAL HIGH (ref 0.5–1.9)

## 2017-10-07 MED ORDER — VANCOMYCIN HCL IN DEXTROSE 1-5 GM/200ML-% IV SOLN
1000.0000 mg | Freq: Once | INTRAVENOUS | Status: AC
  Administered 2017-10-07: 1000 mg via INTRAVENOUS
  Filled 2017-10-07: qty 200

## 2017-10-07 MED ORDER — ACETAMINOPHEN 650 MG RE SUPP: 650.0000 mg | Freq: Four times a day (QID) | RECTAL | Status: DC | PRN

## 2017-10-07 MED ORDER — ONDANSETRON HCL 4 MG PO TABS: 4.0000 mg | ORAL_TABLET | Freq: Four times a day (QID) | ORAL | Status: DC | PRN

## 2017-10-07 MED ORDER — SODIUM CHLORIDE 0.9 % IV SOLN
INTRAVENOUS | Status: AC
  Administered 2017-10-07: via INTRAVENOUS

## 2017-10-07 MED ORDER — FAMOTIDINE 20 MG PO TABS
20.0000 mg | ORAL_TABLET | Freq: Every day | ORAL | Status: DC
  Administered 2017-10-08 – 2017-10-14 (×7): 20 mg via ORAL
  Filled 2017-10-07 (×7): qty 1

## 2017-10-07 MED ORDER — CITALOPRAM HYDROBROMIDE 20 MG PO TABS
10.0000 mg | ORAL_TABLET | Freq: Every day | ORAL | Status: DC
  Administered 2017-10-08 – 2017-10-14 (×7): 10 mg via ORAL
  Filled 2017-10-07 (×7): qty 1

## 2017-10-07 MED ORDER — PRIMIDONE 50 MG PO TABS
50.0000 mg | ORAL_TABLET | Freq: Two times a day (BID) | ORAL | Status: DC
  Administered 2017-10-07 – 2017-10-14 (×14): 50 mg via ORAL
  Filled 2017-10-07 (×15): qty 1

## 2017-10-07 MED ORDER — CARBIDOPA-LEVODOPA 25-100 MG PO TABS
2.0000 | ORAL_TABLET | ORAL | Status: DC
  Administered 2017-10-07 – 2017-10-14 (×21): 2 via ORAL
  Filled 2017-10-07 (×21): qty 2

## 2017-10-07 MED ORDER — PIPERACILLIN-TAZOBACTAM 3.375 G IVPB 30 MIN
3.3750 g | Freq: Once | INTRAVENOUS | Status: AC
  Administered 2017-10-07: 3.375 g via INTRAVENOUS
  Filled 2017-10-07: qty 50

## 2017-10-07 MED ORDER — METOPROLOL TARTRATE 25 MG PO TABS
25.0000 mg | ORAL_TABLET | Freq: Two times a day (BID) | ORAL | Status: DC
  Administered 2017-10-07 – 2017-10-14 (×13): 25 mg
  Filled 2017-10-07 (×15): qty 1

## 2017-10-07 MED ORDER — FENTANYL 25 MCG/HR TD PT72
25.0000 ug | MEDICATED_PATCH | TRANSDERMAL | Status: DC
  Administered 2017-10-07 – 2017-10-13 (×3): 25 ug via TRANSDERMAL
  Filled 2017-10-07 (×3): qty 1

## 2017-10-07 MED ORDER — ASPIRIN EC 81 MG PO TBEC
81.0000 mg | DELAYED_RELEASE_TABLET | Freq: Every day | ORAL | Status: DC
  Administered 2017-10-08 – 2017-10-14 (×7): 81 mg via ORAL
  Filled 2017-10-07 (×7): qty 1

## 2017-10-07 MED ORDER — ACETAMINOPHEN 325 MG PO TABS
650.0000 mg | ORAL_TABLET | Freq: Four times a day (QID) | ORAL | Status: DC | PRN
  Administered 2017-10-08 – 2017-10-10 (×5): 650 mg via ORAL
  Filled 2017-10-07 (×5): qty 2

## 2017-10-07 MED ORDER — LACTATED RINGERS IV BOLUS
1000.0000 mL | Freq: Once | INTRAVENOUS | Status: AC
  Administered 2017-10-07: 1000 mL via INTRAVENOUS

## 2017-10-07 MED ORDER — HEPARIN SODIUM (PORCINE) 5000 UNIT/ML IJ SOLN
5000.0000 [IU] | Freq: Three times a day (TID) | INTRAMUSCULAR | Status: DC
  Administered 2017-10-07 – 2017-10-14 (×20): 5000 [IU] via SUBCUTANEOUS
  Filled 2017-10-07 (×20): qty 1

## 2017-10-07 MED ORDER — FENTANYL CITRATE (PF) 100 MCG/2ML IJ SOLN
50.0000 ug | Freq: Once | INTRAMUSCULAR | Status: AC
  Administered 2017-10-07: 50 ug via INTRAVENOUS
  Filled 2017-10-07: qty 2

## 2017-10-07 MED ORDER — ATORVASTATIN CALCIUM 10 MG PO TABS
10.0000 mg | ORAL_TABLET | Freq: Every day | ORAL | Status: DC
  Administered 2017-10-08 – 2017-10-13 (×6): 10 mg via ORAL
  Filled 2017-10-07 (×6): qty 1

## 2017-10-07 MED ORDER — ONDANSETRON HCL 4 MG/2ML IJ SOLN: 4.0000 mg | Freq: Four times a day (QID) | INTRAMUSCULAR | Status: DC | PRN

## 2017-10-07 MED ORDER — CLONAZEPAM 0.5 MG PO TABS
0.5000 mg | ORAL_TABLET | Freq: Three times a day (TID) | ORAL | Status: DC
  Administered 2017-10-07 – 2017-10-14 (×21): 0.5 mg via ORAL
  Filled 2017-10-07 (×21): qty 1

## 2017-10-07 MED ORDER — SODIUM CHLORIDE 0.9 % IV SOLN
1.0000 g | Freq: Every day | INTRAVENOUS | Status: DC
  Administered 2017-10-07 – 2017-10-09 (×3): 1 g via INTRAVENOUS
  Filled 2017-10-07 (×3): qty 1

## 2017-10-07 NOTE — ED Notes (Signed)
Bed: [id number] Expected date:  Expected time:  Means of arrival:  Comments: 84yo fall

## 2017-10-07 NOTE — Progress Notes (Signed)
A consult was received from an ED physician for Vancomycin and Zosyn per pharmacy dosing.  The patient's profile has been reviewed for ht/wt/allergies/indication/available labs.   A one time order has been placed for Vancomycin 1gm and Zosyn 3.375gm IV x 1 dose each.  Further antibiotics/pharmacy consults should be ordered by admitting physician if indicated.                       Thank you, Maryellen PilePoindexter, Addley Ballinger Trefz, PharmD 10/07/2017  6:00 PM

## 2017-10-07 NOTE — ED Notes (Signed)
Pt sleeping comfortably in room. Pt's daughter came to visit. Daughter given update by Dr. Clarene DukeLittle. Will continue to monitor.

## 2017-10-07 NOTE — ED Provider Notes (Signed)
[facility]-EMERGENCY DEPT Provider Note   CSN: [id number] Arrival date & time: 10/07/17  1656     History   Chief Complaint Chief Complaint  Patient presents with  . Fever    HPI Jack Carter is a 84 y.o. male.  84 year old male with past medical history including CAD status post CABG, hypertension, hyperlipidemia, Parkinson's disease who presents with fever.  Patient was at his nursing facility [facility] and was found this afternoon in his room screaming of generalized pain.  They noted that he was febrile at 102.9.  He was given 1 g of Tylenol prior to arrival.  Currently, he complains of pain in arms, hips, and legs.  He denies any cough or breathing problems.  LEVEL 5 CAVEAT DUE TO AMS  The history is provided by the EMS personnel and the nursing home.  Fever      Past Medical History:  Diagnosis Date  . Cellulitis   . Coronary artery disease    CABG  . Essential hypertension   . Heart attack (HCC)   . Hyperlipidemia   . Hypertension   . Parkinson's disease (HCC)   . Restless leg syndrome     Patient Active Problem List   Diagnosis Date Noted  . Parkinsonism (HCC) 07/16/2017  . Gait abnormality 07/16/2017  . Other dystonia 07/16/2017  . Anxiety and depression   . Acute cystitis with hematuria   . Acute lower UTI 02/19/2017  . Pneumonia 02/19/2017  . CAP (community acquired pneumonia) 02/18/2017  . 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 Acute tracheostomy management (HCC)   . Bradycardia, sinus   . Dyspnea   . Acute respiratory failure (HCC)   . Coronary artery disease involving native coronary artery of native heart without angina pectoris   . Acute on chronic combined systolic and diastolic CHF (congestive heart failure) (HCC)   . Great toe pain, unspecified laterality   . Aspiration into airway   . Hypernatremia   . Elevated troponin 11/04/2016  . Acute encephalopathy   . Goals of  care, counseling/discussion   . Septic shock (HCC) 10/31/2016  . Cellulitis 10/31/2016  . Hyperkalemia 10/31/2016  . AKI (acute kidney injury) (HCC) 10/31/2016  . Cardiogenic shock (HCC) 10/31/2016  . Acute hypoxemic respiratory failure (HCC) 10/31/2016  . CAD (coronary artery disease) of artery bypass graft 10/31/2016  . CHF (congestive heart failure) (HCC) 10/31/2016  . Parkinson disease (HCC) 10/31/2016  . Upper GI bleed 10/31/2016    Past Surgical History:  Procedure Laterality Date  . APPENDECTOMY    . CARDIAC SURGERY  2000  . CARPAL TUNNEL RELEASE Left   . CORONARY ARTERY BYPASS GRAFT    . IR GASTROSTOMY TUBE MOD SED  12/10/2016  . IR GASTROSTOMY TUBE REMOVAL  04/20/2017  . STOMACH SURGERY  2003   intestinal fissure        Home Medications    Prior to Admission medications   Medication Sig Start Date End Date Taking? Authorizing Provider  acetaminophen (TYLENOL) 500 MG tablet Take 1,000 mg by mouth 3 (three) times daily.    Yes [provider]  aspirin EC 81 MG tablet Take 81 mg by mouth daily.   Yes [provider]  atorvastatin (LIPITOR) 10 MG tablet Take 1 tablet (10 mg total) by mouth at bedtime. 11/18/16  Yes Desai, Rahul P, PA-C  carbidopa-levodopa (SINEMET IR) 25-100 MG tablet Take 2 tablets by mouth 3 (three) times daily. At  8am, 12, 5pm. Patient taking differently: Take 2 tablets by mouth 3 (three) times daily. At 10am, 2pm, 8pm. 07/16/17  Yes Levert Feinstein, MD  citalopram (CELEXA) 10 MG tablet Take 1 tablet (10 mg total) by mouth daily. 02/24/17  Yes Maxie Barb, MD  clonazePAM (KLONOPIN) 0.5 MG tablet Take 1 tablet (0.5 mg total) by mouth 3 (three) times daily. 02/23/17  Yes Maxie Barb, MD  Cranberry 450 MG CAPS Take 1 capsule by mouth daily.   Yes [provider]  Emollient (CETAPHIL) cream Apply 1 application topically at bedtime.   Yes [provider]  famotidine (PEPCID) 20 MG tablet Take 20 mg by mouth  daily.   Yes [provider]  fentaNYL (DURAGESIC - DOSED MCG/HR) 25 MCG/HR patch Place 1 patch (25 mcg total) onto the skin every 3 (three) days. 02/23/17  Yes Maxie Barb, MD  furosemide (LASIX) 20 MG tablet Place 3 tablets (60 mg total) into feeding tube 2 (two) times daily. 11/18/16  Yes Desai, Rahul P, PA-C  gabapentin (NEURONTIN) 100 MG capsule Take 500 mg by mouth 3 (three) times daily.   Yes [provider]  metoprolol tartrate (LOPRESSOR) 25 MG tablet Place 1 tablet (25 mg total) into feeding tube 2 (two) times daily. 11/18/16  Yes Desai, Rahul P, PA-C  polyethylene glycol (MIRALAX / GLYCOLAX) packet Take 17 g by mouth daily. 02/24/17  Yes Maxie Barb, MD  primidone (MYSOLINE) 50 MG tablet Take 1 tablet (50 mg total) by mouth every 12 (twelve) hours. 11/18/16  Yes Desai, Rahul P, PA-C  traMADol (ULTRAM) 50 MG tablet Take 1 tablet (50 mg total) by mouth 2 (two) times daily. 1 tablet at 9am and 1 tablet at 5pm daily 02/23/17  Yes Maxie Barb, MD  gabapentin (NEURONTIN) 600 MG tablet Take 1 tablet (600 mg total) by mouth 2 (two) times daily. Patient not taking: Reported on 10/07/2017 02/15/17   Charm Rings, NP    Family History Family History  Problem Relation Age of Onset  . Heart disease Mother   . Cancer Mother   . Pneumonia Father     Social History Social History   Tobacco Use  . Smoking status: Former Games developer  . Smokeless tobacco: Never Used  Substance Use Topics  . Alcohol use: No  . Drug use: No     Allergies   Patient has no known allergies.   Review of Systems Review of Systems  Unable to perform ROS: Mental status change  Constitutional: Positive for fever.     Physical Exam Updated Vital Signs BP (!) 135/54   Pulse (!) 55   Temp 99.8 F (37.7 C) (Rectal)   Resp 10   Wt 66.7 kg (147 lb)   SpO2 100%   BMI 21.71 kg/m   Physical Exam  Constitutional: He appears well-developed and well-nourished. No  distress.  Ill-appearing, sleepy  HENT:  Head: Normocephalic and atraumatic.  dry mucous membranes  Eyes: Pupils are equal, round, and reactive to light. Conjunctivae are normal.  Neck: Neck supple.  Cardiovascular: Regular rhythm. Bradycardia present.  Murmur heard. Pulmonary/Chest: Effort normal and breath sounds normal.  Abdominal: Soft. Bowel sounds are normal. He exhibits no distension. There is no tenderness.  Musculoskeletal: He exhibits edema (mild pitting BLE).  Neurological: He is alert.  Sleepy but arousable, mild confusion but oriented x 2  Skin: Skin is warm and dry.  Faint erythema b/l feet and lower legs   Nursing note  and vitals reviewed.    ED Treatments / Results  Labs (all labs ordered are listed, but only abnormal results are displayed) Labs Reviewed  COMPREHENSIVE METABOLIC PANEL - Abnormal; Notable for the following components:      Result Value   Glucose, Bld 141 (*)    BUN 39 (*)    Creatinine, Ser 1.91 (*)    Albumin 3.4 (*)    GFR calc non Af Amer 31 (*)    GFR calc Af Amer 36 (*)    All other components within normal limits  CBC WITH DIFFERENTIAL/PLATELET - Abnormal; Notable for the following components:   WBC 10.7 (*)    RBC 3.44 (*)    Hemoglobin 10.7 (*)    HCT 33.4 (*)    Platelets 122 (*)    Neutro Abs 8.3 (*)    Monocytes Absolute 1.4 (*)    All other components within normal limits  PROTIME-INR - Abnormal; Notable for the following components:   Prothrombin Time 16.2 (*)    All other components within normal limits  URINALYSIS, ROUTINE W REFLEX MICROSCOPIC - Abnormal; Notable for the following components:   Hgb urine dipstick LARGE (*)    Leukocytes, UA LARGE (*)    WBC, UA >50 (*)    Bacteria, UA MANY (*)    All other components within normal limits  I-STAT CG4 LACTIC ACID, ED - Abnormal; Notable for the following components:   Lactic Acid, Venous 1.91 (*)    All other components within normal limits  CULTURE, BLOOD (ROUTINE X  2)  CULTURE, BLOOD (ROUTINE X 2)  URINE CULTURE  I-STAT CG4 LACTIC ACID, ED    EKG None  Radiology Dg Chest 2 View  Result Date: 10/07/2017 CLINICAL DATA:  84 year old male with a history of generalized pain EXAM: CHEST - 2 VIEW COMPARISON:  02/18/2017, 11/29/2016 FINDINGS: Cardiomediastinal silhouette unchanged in size and contour. Surgical changes of median sternotomy and CABG. Low lung volumes.  No pleural effusion or pneumothorax. Similar appearance of coarsened interstitial markings with reticular opacity, without new airspace disease. Degenerative changes with no acute displaced fracture. IMPRESSION: Chronic lung changes without evidence of lobar pneumonia. Surgical changes of median sternotomy and CABG Electronically Signed   By: Gilmer MorJaime  Wagner D.O.   On: 10/07/2017 18:58    Procedures .Critical Care Performed by: Laurence SpatesLittle, Rachel Morgan, MD Authorized by: Laurence SpatesLittle, Rachel Morgan, MD   Critical care provider statement:    Critical care time (minutes):  30   Critical care time was exclusive of:  Separately billable procedures and treating other patients   Critical care was necessary to treat or prevent imminent or life-threatening deterioration of the following conditions:  Sepsis   Critical care was time spent personally by me on the following activities:  Development of treatment plan with patient or surrogate, evaluation of patient's response to treatment, examination of patient, obtaining history from patient or surrogate, ordering and performing treatments and interventions, ordering and review of laboratory studies, ordering and review of radiographic studies and re-evaluation of patient's condition   (including critical care time)  Medications Ordered in ED Medications  lactated ringers bolus 1,000 mL (1,000 mLs Intravenous New Bag/Given 10/07/17 1758)  piperacillin-tazobactam (ZOSYN) IVPB 3.375 g ( Intravenous Stopped 10/07/17 1836)  vancomycin (VANCOCIN) IVPB 1000 mg/200 mL  premix (1,000 mg Intravenous New Bag/Given 10/07/17 1803)  fentaNYL (SUBLIMAZE) injection 50 mcg (50 mcg Intravenous Given 10/07/17 1936)     Initial Impression / Assessment and Plan / ED Course  I have reviewed the triage vital signs and the nursing notes.  Pertinent labs & imaging results that were available during my care of the patient were reviewed by me and considered in my medical decision making (see chart for details).     PT ill appearing but non-toxic on exam, T 101.1, remainder of VS stable. No abd tenderness. Initiated code sepsis based on fever and AMS.  Obtain blood and urine cultures, gave broad-spectrum antibiotics and small fluid bolus as he does have a history of heart failure.  Lab work shows lactate 1.91, creatinine 1.91, W BC 10.7, hemoglobin 10.7, chest x-ray negative acute.  UA is consistent with infection.  His daughter later arrived to the ED and states that he has a history of urinary tract infections and often gets confused and combative in this scenario.  I updated her on plan to admit and she is available by phone if needed.  Discussed admission with [facility], Dr. Mariea Clonts, and pt admitted for further care.  Final Clinical Impressions(s) / ED Diagnoses   Final diagnoses:  Sepsis, due to unspecified organism [facility])  Urinary tract infection with hematuria, site unspecified    ED Discharge Orders    None       Little, Ambrose Finland, MD 10/07/17 2118

## 2017-10-07 NOTE — H&P (Signed)
History and Physical    Jack Carter UJW:[id number] DOB: 1933/10/15 DOA: 10/07/2017  PCP: [name], MD   Patient coming from: [facility]  Chief Complaint: fever  HPI: Jack Carter is a 84 y.o. male with medical history significant for Parkinson's disease, septic and cardiogenic shock, CABG, systolic and diastolic CHF, was brought in from [facility] with reports that patient's was found in his room screaming of generalized pain, was found to have a fever of 102.9.  Patient is awake and alert, able to answer simple questions, his speech is sometimes hard to comprehend. Patient endorses dysuria, denies abdominal pain, denies difficulty breathing or cough.  Called nursing home- [phone].  Talked to tech, who takes care of patient.  Patient would intermittently scream in pain this is not new, he will be given pain medications and would feel better. Because patient speech is quite hard to understand, its hard to tell exactly if there is some confusion.  Otherwise no significant change in patient's mental status compared to his baseline.  Patient did not fall, he was found on his bed today.  Patient is wheelchair-bound.  Patient is a full code. No  Chronic Foley.  ED Course: Temperature 101.1.  WBC 10.9.  Creatinine mildly elevated 1.9.  Two-view chest x-ray negative for acute abnormality.  UA large hemoglobin and leukocytes many bacteria.  Patient was started on IV Vanco and Zosyn in the ED. lactic acid 1.9.  1 L bolus given.  There was initial concern for metabolic encephalopathy but on talking to nursing home patient he is at his baseline.  Review of Systems: As per HPI otherwise other systems reviewed and negative.  Past Medical History:  Diagnosis Date  . Cellulitis   . Coronary artery disease    CABG  . Essential hypertension   . Heart attack (HCC)   . Hyperlipidemia   . Hypertension   . Parkinson's disease (HCC)   . Restless  leg syndrome     Past Surgical History:  Procedure Laterality Date  . APPENDECTOMY    . CARDIAC SURGERY  2000  . CARPAL TUNNEL RELEASE Left   . CORONARY ARTERY BYPASS GRAFT    . IR GASTROSTOMY TUBE MOD SED  12/10/2016  . IR GASTROSTOMY TUBE REMOVAL  04/20/2017  . STOMACH SURGERY  2003   intestinal fissure     reports that he has quit smoking. He has never used smokeless tobacco. He reports that he does not drink alcohol or use drugs.  No Known Allergies  Family History  Problem Relation Age of Onset  . Heart disease Mother   . Cancer Mother   . Pneumonia Father     Prior to Admission medications   Medication Sig Start Date End Date Taking? Authorizing Provider  acetaminophen (TYLENOL) 500 MG tablet Take 1,000 mg by mouth 3 (three) times daily.    Yes [provider]  aspirin EC 81 MG tablet Take 81 mg by mouth daily.   Yes [provider]  atorvastatin (LIPITOR) 10 MG tablet Take 1 tablet (10 mg total) by mouth at bedtime. 11/18/16  Yes Desai, Rahul P, PA-C  carbidopa-levodopa (SINEMET IR) 25-100 MG tablet Take 2 tablets by mouth 3 (three) times daily. At 8am, 12, 5pm. Patient taking differently: Take 2 tablets by mouth 3 (three) times daily. At 10am, 2pm, 8pm. 07/16/17  Yes Levert Feinstein, MD  citalopram (CELEXA) 10 MG tablet Take 1 tablet (10 mg total) by  mouth daily. 02/24/17  Yes Maxie BarbBhandari, Dron Prasad, MD  clonazePAM (KLONOPIN) 0.5 MG tablet Take 1 tablet (0.5 mg total) by mouth 3 (three) times daily. 02/23/17  Yes Maxie BarbBhandari, Dron Prasad, MD  Cranberry 450 MG CAPS Take 1 capsule by mouth daily.   Yes [provider]  Emollient (CETAPHIL) cream Apply 1 application topically at bedtime.   Yes [provider]  famotidine (PEPCID) 20 MG tablet Take 20 mg by mouth daily.   Yes [provider]  fentaNYL (DURAGESIC - DOSED MCG/HR) 25 MCG/HR patch Place 1 patch (25 mcg total) onto the skin every 3 (three) days. 02/23/17  Yes Maxie BarbBhandari, Dron  Prasad, MD  furosemide (LASIX) 20 MG tablet Place 3 tablets (60 mg total) into feeding tube 2 (two) times daily. 11/18/16  Yes Desai, Rahul P, PA-C  gabapentin (NEURONTIN) 100 MG capsule Take 500 mg by mouth 3 (three) times daily.   Yes [provider]  metoprolol tartrate (LOPRESSOR) 25 MG tablet Place 1 tablet (25 mg total) into feeding tube 2 (two) times daily. 11/18/16  Yes Desai, Rahul P, PA-C  polyethylene glycol (MIRALAX / GLYCOLAX) packet Take 17 g by mouth daily. 02/24/17  Yes Maxie BarbBhandari, Dron Prasad, MD  primidone (MYSOLINE) 50 MG tablet Take 1 tablet (50 mg total) by mouth every 12 (twelve) hours. 11/18/16  Yes Desai, Rahul P, PA-C  traMADol (ULTRAM) 50 MG tablet Take 1 tablet (50 mg total) by mouth 2 (two) times daily. 1 tablet at 9am and 1 tablet at 5pm daily 02/23/17  Yes Maxie BarbBhandari, Dron Prasad, MD  gabapentin (NEURONTIN) 600 MG tablet Take 1 tablet (600 mg total) by mouth 2 (two) times daily. Patient not taking: Reported on 10/07/2017 02/15/17   Charm RingsLord, Jamison Y, NP    Physical Exam: Vitals:   10/07/17 2100 10/07/17 2130 10/07/17 2136 10/07/17 2200  BP: (!) 145/61 (!) 136/58 138/62 (!) 161/76  Pulse: (!) 59 (!) 59 (!) 59   Resp: 12 13 12 13   Temp:      TempSrc:      SpO2: 100% 100% 100%   Weight:        Constitutional: NAD, calm, comfortable Vitals:   10/07/17 2100 10/07/17 2130 10/07/17 2136 10/07/17 2200  BP: (!) 145/61 (!) 136/58 138/62 (!) 161/76  Pulse: (!) 59 (!) 59 (!) 59   Resp: 12 13 12 13   Temp:      TempSrc:      SpO2: 100% 100% 100%   Weight:       Eyes: PERRL, lids and conjunctivae normal ENMT: Mucous membranes are dry. .Normal dentition.  Neck: normal, supple, no masses, no thyromegaly Respiratory: clear to auscultation bilaterally, no wheezing, no crackles. Normal respiratory effort. No accessory muscle use.  Cardiovascular: Regular rate and rhythm, no murmurs / rubs / gallops. No extremity edema. 2+ pedal pulses. No carotid bruits.  Abdomen: no  tenderness, no masses palpated. No hepatosplenomegaly. Bowel sounds positive.  Musculoskeletal: no clubbing / cyanosis.  Contractures bilateral knees  skin: no rashes, lesions, ulcers. No induration Neurologic: No obvious cranial nerve abnormality.  Able to move all extremities spontaneously.  Strength lower extremities bilaterally reduced, with contractures.  Right lower extremity foot and mid leg with erythema, without warmth.  Patient is tender bilateral lower extremities. Psychiatric: Awake and alert .  Oriented to person and place .  Able to tell me Trump is president .  Able to tell me doctor's name. Asked me if today was Wednesday or Thursday.   Labs  on Admission: I have personally reviewed following labs and imaging studies  CBC: Recent Labs  Lab 10/07/17 1727  WBC 10.7*  NEUTROABS 8.3*  HGB 10.7*  HCT 33.4*  MCV 97.1  PLT 122*   Basic Metabolic Panel: Recent Labs  Lab 10/07/17 1727  NA 142  K 4.0  CL 101  CO2 27  GLUCOSE 141*  BUN 39*  CREATININE 1.91*  CALCIUM 9.0   GFR: Estimated Creatinine Clearance: 28.1 mL/min (A) (by C-G formula based on SCr of 1.91 mg/dL (H)). Liver Function Tests: Recent Labs  Lab 10/07/17 1727  AST 26  ALT 11  ALKPHOS 122  BILITOT 1.1  PROT 7.7  ALBUMIN 3.4*   Coagulation Profile: Recent Labs  Lab 10/07/17 1727  INR 1.31   Urine analysis:    Component Value Date/Time   COLORURINE YELLOW 10/07/2017 1950   APPEARANCEUR CLEAR 10/07/2017 1950   LABSPEC 1.016 10/07/2017 1950   PHURINE 6.0 10/07/2017 1950   GLUCOSEU NEGATIVE 10/07/2017 1950   HGBUR LARGE (A) 10/07/2017 1950   BILIRUBINUR NEGATIVE 10/07/2017 1950   KETONESUR NEGATIVE 10/07/2017 1950   PROTEINUR NEGATIVE 10/07/2017 1950   NITRITE NEGATIVE 10/07/2017 1950   LEUKOCYTESUR LARGE (A) 10/07/2017 1950    Radiological Exams on Admission: Dg Chest 2 View  Result Date: 10/07/2017 CLINICAL DATA:  84 year old male with a history of generalized pain EXAM: CHEST - 2  VIEW COMPARISON:  02/18/2017, 11/29/2016 FINDINGS: Cardiomediastinal silhouette unchanged in size and contour. Surgical changes of median sternotomy and CABG. Low lung volumes.  No pleural effusion or pneumothorax. Similar appearance of coarsened interstitial markings with reticular opacity, without new airspace disease. Degenerative changes with no acute displaced fracture. IMPRESSION: Chronic lung changes without evidence of lobar pneumonia. Surgical changes of median sternotomy and CABG Electronically Signed   By: Gilmer Mor D.O.   On: 10/07/2017 18:58    EKG: None.  Assessment/Plan Active Problems:   Metabolic encephalopathy   Urinary tract infection-fever, dysuria.  WBC 10.7.  Lactic acid 1.9.  Patient on beta-blocker.  UA suggestive.  Mental status appears to be at baseline. IV Vanco Zosyn started in ED. urine cultures 2018 Enterobacter.  Does not meet sepsis criteria at this time. -Continue antibiotics with IV ceftriaxone  -Trend lactic acid -Follow-up blood and urine cultures drawn in ED - CBC a.m  Parkinson's disease-mental status appears to be at baseline, without significant change. Wheel Chair bound -Continue home Sinemet -Continue home fentanyl patch and scheduled clonazepam  HTN-blood pressure stable. -Continue home 25mg  daily metoprolol  CHF-systolic and diastolic.  Appears stable.   - cont Home medications metoprolol, hold home Lasix  CKD 3- Probably mild AKI. Cr- 1.9, baseline 1.5-1.7.  - hydration - BMP   DVT prophylaxis: heparin Code Status: Full Family Communication: None at bedside Disposition Plan: per rounding team Consults called: None Admission status: Obs, tele   Onnie Boer MD [facility] Pager [phone] From 6PM-2AM.  Otherwise please contact night-coverage [email or web address] Password [email or web address]  10/07/2017, 10:19 PM

## 2017-10-07 NOTE — ED Triage Notes (Signed)
[facility] [address]. Patient is wheelchair bound and has history of parkinsons disease. Patient was found in his room screaming of generalized pain. Patient was found to be febrile at 102.73F Tympanic. Patient ws given 1000mg  PO tylenol at 1638. PIV 18G placed to Right forearm by EMS. CBG for EMS = 132. EMS VS: 150/60, HR 72, SpO2 97% RA, RR 18. Patient has legal guardian Rhae Lerneraige Crutcher POA [phone])

## 2017-10-07 NOTE — ED Notes (Signed)
ED TO INPATIENT HANDOFF REPORT  Name/Age/Gender Jack Carter 84 y.o. male  Code Status Code Status History    Date Active Date Inactive Code Status Order ID Comments User Context   02/19/2017 0424 02/23/2017 1701 Full Code [id number]  Rise Patience, MD ED   02/13/2017 2352 02/15/2017 1726 Full Code [id number]  Jola Schmidt, MD ED   11/18/2016 2239 12/26/2016 1753 Full Code [id number]  Baggs, [facility] Inpatient   11/04/2016 0952 11/18/2016 2003 Full Code [id number]  Mendel Corning, MD Inpatient   10/31/2016 2118 11/04/2016 4742 DNR [id number]  Aldean Jewett, MD ED    Advance Directive Documentation     Most Recent Value  Type of Advance Directive  Healthcare Power of Attorney  Pre-existing out of facility DNR order (yellow form or pink MOST form)  -  "MOST" Form in Place?  -      Home/SNF/Other SNF/assisted living  Chief Complaint fever   Level of Care/Admitting Diagnosis ED Disposition    ED Disposition Condition [place]: [facility] [[id number]]  Level of Care: Telemetry [5]  Admit to tele based on following criteria: Other see comments  Comments: Metabolic encephalopathy  Diagnosis: Metabolic encephalopathy [433.29.ICD-9-CM]  Admitting Physician: Bethena Roys [[id number]]  Attending Physician: Bethena Roys [phone]  Estimated length of stay: past midnight tomorrow  Certification:: I certify this patient will need inpatient services for at least 2 midnights  PT Class (Do Not Modify): Inpatient [101]  PT Acc Code (Do Not Modify): Private [1]       Medical History Past Medical History:  Diagnosis Date  . Cellulitis   . Coronary artery disease    CABG  . Essential hypertension   . Heart attack ([place])   . Hyperlipidemia   . Hypertension   . Parkinson's disease ([place])   . Restless leg syndrome     Allergies No Known Allergies  IV Location/Drains/Wounds Patient Lines/Drains/Airways Status   Active  Line/Drains/Airways    Name:   Placement date:   Placement time:   Site:   Days:   Peripheral IV 10/07/17 Right Forearm   10/07/17    -    Forearm   less than 1   Peripheral IV 10/07/17 Left Forearm   10/07/17    1751    Forearm   less than 1   Gastrostomy/Enterostomy Gastrostomy 20 Fr. LUQ   12/10/16    1422    LUQ   301   Rectal Tube/Pouch   11/09/16    1506    -   332   External Urinary Catheter   11/12/16    1421    -   329          Labs/Imaging Results for orders placed or performed during the hospital encounter of 10/07/17 (from the past 48 hour(s))  Comprehensive metabolic panel     Status: Abnormal   Collection Time: 10/07/17  5:27 PM  Result Value Ref Range   Sodium 142 135 - 145 mmol/L   Potassium 4.0 3.5 - 5.1 mmol/L   Chloride 101 98 - 111 mmol/L   CO2 27 22 - 32 mmol/L   Glucose, Bld 141 (H) 70 - 99 mg/dL   BUN 39 (H) 8 - 23 mg/dL   Creatinine, Ser 1.91 (H) 0.61 - 1.24 mg/dL   Calcium 9.0 8.9 - 10.3 mg/dL   Total Protein 7.7 6.5 - 8.1 g/dL   Albumin 3.4 (L) 3.5 -  5.0 g/dL   AST 26 15 - 41 U/L   ALT 11 0 - 44 U/L   Alkaline Phosphatase 122 38 - 126 U/L   Total Bilirubin 1.1 0.3 - 1.2 mg/dL   GFR calc non Af Amer 31 (L) >60 mL/min   GFR calc Af Amer 36 (L) >60 mL/min    Comment: (NOTE) The eGFR has been calculated using the CKD EPI equation. This calculation has not been validated in all clinical situations. eGFR's persistently <60 mL/min signify possible Chronic Kidney Disease.    Anion gap 14 5 - 15    Comment: Performed at [facility], [place] [address] [place], [place] [address]  CBC with Differential     Status: Abnormal   Collection Time: 10/07/17  5:27 PM  Result Value Ref Range   WBC 10.7 (H) 4.0 - 10.5 K/uL   RBC 3.44 (L) 4.22 - 5.81 MIL/uL   Hemoglobin 10.7 (L) 13.0 - 17.0 g/dL   HCT 33.4 (L) 39.0 - 52.0 %   MCV 97.1 78.0 - 100.0 fL   MCH 31.1 26.0 - 34.0 pg   MCHC 32.0 30.0 - 36.0 g/dL   RDW 14.4 11.5 - 15.5 %   Platelets 122  (L) 150 - 400 K/uL   Neutrophils Relative % 78 %   Neutro Abs 8.3 (H) 1.7 - 7.7 K/uL   Lymphocytes Relative 9 %   Lymphs Abs 1.0 0.7 - 4.0 K/uL   Monocytes Relative 13 %   Monocytes Absolute 1.4 (H) 0.1 - 1.0 K/uL   Eosinophils Relative 0 %   Eosinophils Absolute 0.0 0.0 - 0.7 K/uL   Basophils Relative 0 %   Basophils Absolute 0.0 0.0 - 0.1 K/uL    Comment: Performed at [facility], [place] [address], [place], [place] [address]  Protime-INR     Status: Abnormal   Collection Time: 10/07/17  5:27 PM  Result Value Ref Range   Prothrombin Time 16.2 (H) 11.4 - 15.2 seconds   INR 1.31     Comment: Performed at [facility], [place] [address], [place], [place] [address]  I-Stat CG4 Lactic Acid, ED     Status: Abnormal   Collection Time: 10/07/17  5:52 PM  Result Value Ref Range   Lactic Acid, Venous 1.91 (H) 0.5 - 1.9 mmol/L  Urinalysis, Routine w reflex microscopic     Status: Abnormal   Collection Time: 10/07/17  7:50 PM  Result Value Ref Range   Color, Urine YELLOW YELLOW   APPearance CLEAR CLEAR   Specific Gravity, Urine 1.016 1.005 - 1.030   pH 6.0 5.0 - 8.0   Glucose, UA NEGATIVE NEGATIVE mg/dL   Hgb urine dipstick LARGE (A) NEGATIVE   Bilirubin Urine NEGATIVE NEGATIVE   Ketones, ur NEGATIVE NEGATIVE mg/dL   Protein, ur NEGATIVE NEGATIVE mg/dL   Nitrite NEGATIVE NEGATIVE   Leukocytes, UA LARGE (A) NEGATIVE   RBC / HPF 21-50 0 - 5 RBC/hpf   WBC, UA >50 (H) 0 - 5 WBC/hpf   Bacteria, UA MANY (A) NONE SEEN   WBC Clumps PRESENT     Comment: Performed at [facility], [place] [address], [place], [place] [address]  I-Stat CG4 Lactic Acid, ED     Status: None   Collection Time: 10/07/17 10:22 PM  Result Value Ref Range   Lactic Acid, Venous 1.32 0.5 - 1.9 mmol/L   Dg Chest 2 View  Result Date: 10/07/2017 CLINICAL DATA:  84 year old  male with a history of generalized pain EXAM: CHEST - 2 VIEW COMPARISON:  02/18/2017, 11/29/2016  FINDINGS: Cardiomediastinal silhouette unchanged in size and contour. Surgical changes of median sternotomy and CABG. Low lung volumes.  No pleural effusion or pneumothorax. Similar appearance of coarsened interstitial markings with reticular opacity, without new airspace disease. Degenerative changes with no acute displaced fracture. IMPRESSION: Chronic lung changes without evidence of lobar pneumonia. Surgical changes of median sternotomy and CABG Electronically Signed   By: Corrie Mckusick D.O.   On: 10/07/2017 18:58    Pending Labs Unresulted Labs (From admission, onward)   Start     Ordered   10/07/17 1734  Urine culture  STAT,   STAT     10/07/17 1733   10/07/17 1727  Culture, blood (Routine x 2)  BLOOD CULTURE X 2,   STAT     10/07/17 1726   Signed and Held  Basic metabolic panel  Tomorrow morning,   R     Signed and Held   Signed and Held  CBC  Tomorrow morning,   R     Signed and Held      Vitals/Pain Today's Vitals   10/07/17 2100 10/07/17 2130 10/07/17 2136 10/07/17 2200  BP: (!) 145/61 (!) 136/58 138/62 (!) 161/76  Pulse: (!) 59 (!) 59 (!) 59   Resp: _0 Temp:      TempSrc:      SpO2: 100% 100% 100%   Weight:      PainSc:        Isolation Precautions No active isolations  Medications Medications  lactated ringers bolus 1,000 mL (0 mLs Intravenous Stopped 10/07/17 1828)  piperacillin-tazobactam (ZOSYN) IVPB 3.375 g ( Intravenous Stopped 10/07/17 1836)  vancomycin (VANCOCIN) IVPB 1000 mg/200 mL premix (0 mg Intravenous Stopped 10/07/17 1903)  fentaNYL (SUBLIMAZE) injection 50 mcg (50 mcg Intravenous Given 10/07/17 1936)    Mobility non-ambulatory

## 2017-10-08 DIAGNOSIS — I130 Hypertensive heart and chronic kidney disease with heart failure and stage 1 through stage 4 chronic kidney disease, or unspecified chronic kidney disease: Secondary | ICD-10-CM | POA: Diagnosis present

## 2017-10-08 DIAGNOSIS — I10 Essential (primary) hypertension: Secondary | ICD-10-CM | POA: Diagnosis not present

## 2017-10-08 DIAGNOSIS — L03115 Cellulitis of right lower limb: Secondary | ICD-10-CM

## 2017-10-08 DIAGNOSIS — N179 Acute kidney failure, unspecified: Secondary | ICD-10-CM | POA: Diagnosis present

## 2017-10-08 DIAGNOSIS — R319 Hematuria, unspecified: Secondary | ICD-10-CM | POA: Diagnosis not present

## 2017-10-08 DIAGNOSIS — I252 Old myocardial infarction: Secondary | ICD-10-CM | POA: Diagnosis not present

## 2017-10-08 DIAGNOSIS — A419 Sepsis, unspecified organism: Secondary | ICD-10-CM | POA: Diagnosis present

## 2017-10-08 DIAGNOSIS — M2450 Contracture, unspecified joint: Secondary | ICD-10-CM | POA: Diagnosis present

## 2017-10-08 DIAGNOSIS — Z951 Presence of aortocoronary bypass graft: Secondary | ICD-10-CM | POA: Diagnosis not present

## 2017-10-08 DIAGNOSIS — I2510 Atherosclerotic heart disease of native coronary artery without angina pectoris: Secondary | ICD-10-CM | POA: Diagnosis present

## 2017-10-08 DIAGNOSIS — G20 Parkinson's disease: Secondary | ICD-10-CM | POA: Diagnosis present

## 2017-10-08 DIAGNOSIS — Z7189 Other specified counseling: Secondary | ICD-10-CM | POA: Diagnosis not present

## 2017-10-08 DIAGNOSIS — Z1612 Extended spectrum beta lactamase (ESBL) resistance: Secondary | ICD-10-CM | POA: Diagnosis present

## 2017-10-08 DIAGNOSIS — N390 Urinary tract infection, site not specified: Secondary | ICD-10-CM | POA: Diagnosis present

## 2017-10-08 DIAGNOSIS — Z87891 Personal history of nicotine dependence: Secondary | ICD-10-CM | POA: Diagnosis not present

## 2017-10-08 DIAGNOSIS — Z66 Do not resuscitate: Secondary | ICD-10-CM | POA: Diagnosis present

## 2017-10-08 DIAGNOSIS — Z993 Dependence on wheelchair: Secondary | ICD-10-CM | POA: Diagnosis not present

## 2017-10-08 DIAGNOSIS — R1310 Dysphagia, unspecified: Secondary | ICD-10-CM | POA: Diagnosis present

## 2017-10-08 DIAGNOSIS — Z515 Encounter for palliative care: Secondary | ICD-10-CM | POA: Diagnosis not present

## 2017-10-08 DIAGNOSIS — A4159 Other Gram-negative sepsis: Secondary | ICD-10-CM | POA: Diagnosis present

## 2017-10-08 DIAGNOSIS — N183 Chronic kidney disease, stage 3 (moderate): Secondary | ICD-10-CM | POA: Diagnosis present

## 2017-10-08 DIAGNOSIS — Z79899 Other long term (current) drug therapy: Secondary | ICD-10-CM | POA: Diagnosis not present

## 2017-10-08 DIAGNOSIS — G2581 Restless legs syndrome: Secondary | ICD-10-CM | POA: Diagnosis present

## 2017-10-08 DIAGNOSIS — I5042 Chronic combined systolic (congestive) and diastolic (congestive) heart failure: Secondary | ICD-10-CM | POA: Diagnosis present

## 2017-10-08 DIAGNOSIS — G9341 Metabolic encephalopathy: Secondary | ICD-10-CM | POA: Diagnosis present

## 2017-10-08 DIAGNOSIS — E785 Hyperlipidemia, unspecified: Secondary | ICD-10-CM | POA: Diagnosis present

## 2017-10-08 LAB — BASIC METABOLIC PANEL
Anion gap: 9 (ref 5–15)
BUN: 37 mg/dL — AB (ref 8–23)
CHLORIDE: 103 mmol/L (ref 98–111)
CO2: 28 mmol/L (ref 22–32)
CREATININE: 1.69 mg/dL — AB (ref 0.61–1.24)
Calcium: 8.7 mg/dL — ABNORMAL LOW (ref 8.9–10.3)
GFR calc Af Amer: 42 mL/min — ABNORMAL LOW (ref >60)
GFR calc non Af Amer: 36 mL/min — ABNORMAL LOW (ref >60)
Glucose, Bld: 102 mg/dL — ABNORMAL HIGH (ref 70–99)
Potassium: 4 mmol/L (ref 3.5–5.1)
Sodium: 140 mmol/L (ref 135–145)

## 2017-10-08 LAB — CBC
HCT: 31.3 % — ABNORMAL LOW (ref 39.0–52.0)
Hemoglobin: 10 g/dL — ABNORMAL LOW (ref 13.0–17.0)
MCH: 31.1 pg (ref 26.0–34.0)
MCHC: 31.9 g/dL (ref 30.0–36.0)
MCV: 97.2 fL (ref 78.0–100.0)
PLATELETS: 111 10*3/uL — AB (ref 150–400)
RBC: 3.22 MIL/uL — ABNORMAL LOW (ref 4.22–5.81)
RDW: 14.4 % (ref 11.5–15.5)
WBC: 9 10*3/uL (ref 4.0–10.5)

## 2017-10-08 LAB — MRSA PCR SCREENING: MRSA by PCR: POSITIVE — AB

## 2017-10-08 MED ORDER — LORAZEPAM 2 MG/ML IJ SOLN
0.5000 mg | Freq: Once | INTRAMUSCULAR | Status: AC
  Administered 2017-10-08: 0.5 mg via INTRAVENOUS
  Filled 2017-10-08: qty 1

## 2017-10-08 MED ORDER — SENNOSIDES-DOCUSATE SODIUM 8.6-50 MG PO TABS
2.0000 | ORAL_TABLET | Freq: Every evening | ORAL | Status: DC | PRN
  Administered 2017-10-08: 2 via ORAL
  Filled 2017-10-08: qty 2

## 2017-10-08 MED ORDER — CHLORHEXIDINE GLUCONATE CLOTH 2 % EX PADS
6.0000 | MEDICATED_PAD | Freq: Every day | CUTANEOUS | Status: AC
  Administered 2017-10-08 – 2017-10-12 (×5): 6 via TOPICAL

## 2017-10-08 MED ORDER — POLYETHYLENE GLYCOL 3350 17 G PO PACK: 17.0000 g | PACK | Freq: Every day | ORAL | Status: DC | PRN

## 2017-10-08 MED ORDER — MUPIROCIN 2 % EX OINT
1.0000 "application " | TOPICAL_OINTMENT | Freq: Two times a day (BID) | CUTANEOUS | Status: AC
  Administered 2017-10-08 – 2017-10-12 (×10): 1 via NASAL
  Filled 2017-10-08 (×6): qty 22

## 2017-10-08 NOTE — Progress Notes (Addendum)
PROGRESS NOTE    Jack Carter  ZOX:[id number] DOB: November 26, 1933 DOA: 10/07/2017 PCP: [name], MD   Brief Narrative: Jack Carter is a 84 y.o. male with medical history significant for Parkinson's disease, septic and cardiogenic shock, CABG, systolic and diastolic CHF. He presented secondary to fever and found to have UTI.   Assessment & Plan:   Active Problems:   UTI (urinary tract infection)   Cellulitis of right foot   UTI Symptoms of fever and dysuria. Started on Vancomycin/Zosyn initially and switched to Ceftriaxone. -Urine culture/blood culture -Continue Ceftriaxone  Parkinson's disease Unknown patient baseline. Will check with facility. -Continue Sinemet  Chronic medications Patient is on Fentanyl patch and Klonopin for unknown reason. Will check with facility  Essential hypertension -Continue metoprolol  Chronic combined systolic and diastolic heart failure EF of 40-45% with grade 2 diastolic heart failure  CKD stage 3 Stable.  Right foot cellulitis -Ceftriaxone as mentioned above   DVT prophylaxis: Heparin Code Status:   Code Status: Full Code Family Communication: None at bedside. Called daughter; no response Disposition Plan: Discharge when medically treated   Consultants:   None  Procedures:   None  Antimicrobials:  Vancomycin (8/7)  Zosyn (8/7)  Ceftriaxone  (8/7>>   Subjective: Difficult to understand patient. Afebrile overnight.  Objective: Vitals:   10/08/17 0205 10/08/17 0536 10/08/17 0542 10/08/17 1020  BP:  (!) 123/49  (!) 156/64  Pulse:  (!) 55  (!) 57  Resp:  20  16  Temp: (!) 100.5 F (38.1 C)  (!) 100.5 F (38.1 C)   TempSrc: Rectal  Rectal   SpO2:  97%  99%  Weight:   73.3 kg   Height:        Intake/Output Summary (Last 24 hours) at 10/08/2017 1030 Last data filed at 10/08/2017 0600 Gross per 24 hour  Intake 649.34 ml  Output 350 ml  Net 299.34 ml   Filed Weights   10/07/17 1724  10/07/17 2303 10/08/17 0542  Weight: 66.7 kg 72.1 kg 73.3 kg    Examination:  General exam: Appears calm and comfortable Respiratory system: Clear to auscultation. Respiratory effort normal. Cardiovascular system: S1 & S2 heard, RRR. 2/6 holosystolic murmur Gastrointestinal system: Abdomen is nondistended, soft and nontender. No organomegaly or masses felt. Normal bowel sounds heard. Central nervous system: Alert. Difficult to assess orientation secondary to speech. Extremities: No edema. No calf tenderness. Contractures of bilateral legs/feet Skin: No cyanosis. Erythematous rash on dorsum of right foot with no overlying tenderness or drainage Psychiatry: Judgement and insight appear normal. Mood & affect appropriate.     Data Reviewed: I have personally reviewed following labs and imaging studies  CBC: Recent Labs  Lab 10/07/17 1727 10/08/17 0511  WBC 10.7* 9.0  NEUTROABS 8.3*  --   HGB 10.7* 10.0*  HCT 33.4* 31.3*  MCV 97.1 97.2  PLT 122* 111*   Basic Metabolic Panel: Recent Labs  Lab 10/07/17 1727 10/08/17 0511  NA 142 140  K 4.0 4.0  CL 101 103  CO2 27 28  GLUCOSE 141* 102*  BUN 39* 37*  CREATININE 1.91* 1.69*  CALCIUM 9.0 8.7*   GFR: Estimated Creatinine Clearance: 33.7 mL/min (A) (by C-G formula based on SCr of 1.69 mg/dL (H)). Liver Function Tests: Recent Labs  Lab 10/07/17 1727  AST 26  ALT 11  ALKPHOS 122  BILITOT 1.1  PROT 7.7  ALBUMIN 3.4*   No results for input(s): LIPASE, AMYLASE in the last 168 hours. No  results for input(s): AMMONIA in the last 168 hours. Coagulation Profile: Recent Labs  Lab 10/07/17 1727  INR 1.31   Cardiac Enzymes: No results for input(s): CKTOTAL, CKMB, CKMBINDEX, TROPONINI in the last 168 hours. BNP (last 3 results) No results for input(s): PROBNP in the last 8760 hours. HbA1C: No results for input(s): HGBA1C in the last 72 hours. CBG: No results for input(s): GLUCAP in the last 168 hours. Lipid  Profile: No results for input(s): CHOL, HDL, LDLCALC, TRIG, CHOLHDL, LDLDIRECT in the last 72 hours. Thyroid Function Tests: No results for input(s): TSH, T4TOTAL, FREET4, T3FREE, THYROIDAB in the last 72 hours. Anemia Panel: No results for input(s): VITAMINB12, FOLATE, FERRITIN, TIBC, IRON, RETICCTPCT in the last 72 hours. Sepsis Labs: Recent Labs  Lab 10/07/17 1752 10/07/17 2222  LATICACIDVEN 1.91* 1.32    Recent Results (from the past 240 hour(s))  MRSA PCR Screening     Status: Abnormal   Collection Time: 10/08/17  5:33 AM  Result Value Ref Range Status   MRSA by PCR POSITIVE (A) NEGATIVE Final    Comment:        The GeneXpert MRSA Assay (FDA approved for NASAL specimens only), is one component of a comprehensive MRSA colonization surveillance program. It is not intended to diagnose MRSA infection nor to guide or monitor treatment for MRSA infections. RESULT CALLED TO, READ BACK BY AND VERIFIED WITH: Trilby LeaverNELSON,A. RN @0721  ON 8.8.19 BY [facility] Performed at [facility], [address] [place], [place] [address]          Radiology Studies: Dg Chest 2 View  Result Date: 10/07/2017 CLINICAL DATA:  84 year old male with a history of generalized pain EXAM: CHEST - 2 VIEW COMPARISON:  02/18/2017, 11/29/2016 FINDINGS: Cardiomediastinal silhouette unchanged in size and contour. Surgical changes of median sternotomy and CABG. Low lung volumes.  No pleural effusion or pneumothorax. Similar appearance of coarsened interstitial markings with reticular opacity, without new airspace disease. Degenerative changes with no acute displaced fracture. IMPRESSION: Chronic lung changes without evidence of lobar pneumonia. Surgical changes of median sternotomy and CABG Electronically Signed   By: Gilmer MorJaime  Wagner D.O.   On: 10/07/2017 18:58        Scheduled Meds: . aspirin EC  81 mg Oral Daily  . atorvastatin  10 mg Oral QHS  . carbidopa-levodopa  2 tablet Oral 3 times per  day  . Chlorhexidine Gluconate Cloth  6 each Topical Q0600  . citalopram  10 mg Oral Daily  . clonazePAM  0.5 mg Oral TID  . famotidine  20 mg Oral Daily  . fentaNYL  25 mcg Transdermal Q72H  . heparin  5,000 Units Subcutaneous Q8H  . metoprolol tartrate  25 mg Per Tube BID  . mupirocin ointment  1 application Nasal BID  . primidone  50 mg Oral Q12H   Continuous Infusions: . sodium chloride 50 mL/hr at 10/08/17 0600  . cefTRIAXone (ROCEPHIN)  IV Stopped (10/08/17 0006)     LOS: 1 day     Jacquelin Hawkingalph Sunday Klos, MD [facility] 10/08/2017, 10:30 AM Pager: [phone]  If 7PM-7AM, please contact night-coverage [email or web address] 10/08/2017, 10:30 AM

## 2017-10-08 NOTE — Care Management Note (Signed)
Case Management Note  Patient Details  Name: Jack Carter MRN: [id number] Date of Birth: 11/29/1933  Subjective/Objective:    Patient from ALF [facility]. PT cons-await recc. Spoke to dtr Sander NephewPage Crutcher on phone-informed of observation status, & process of reccommendations from disciplines. She prefers for patient to return back to ALF. Informed her that it's up to the facility to determine of they can manage the patient's needs, & that our CSW will update her on the status of the facility. MD will call dtr with medical update.               Action/Plan:d/c ALF/SNF   Expected Discharge Date:  (unknown)               Expected Discharge Plan:  Assisted Living / Rest Home  In-House Referral:  Clinical Social Work  Discharge planning Services     Post Acute Care Choice:    Choice offered to:     DME Arranged:    DME Agency:     HH Arranged:    HH Agency:     Status of Service:  In process, will continue to follow  If discussed at Long Length of Stay Meetings, dates discussed:    Additional Comments:  [name], [name], RN 10/08/2017, 2:33 PM

## 2017-10-08 NOTE — Care Management Obs Status (Signed)
MEDICARE OBSERVATION STATUS NOTIFICATION   Patient Details  Name: Jack Carter MRN: [id number] Date of Birth: 03/07/1933   Medicare Observation Status Notification Given:  Yes    MahabirOlegario Messier, Mimi Debellis, RN 10/08/2017, 2:32 PM

## 2017-10-08 NOTE — Evaluation (Signed)
Physical Therapy Evaluation Patient Details Name: Jack Carter MRN: [id number] DOB: 12/12/1933 Today's Date: 10/08/2017   History of Present Illness  84 yo  male with onset of weakness and concern for confusion but pt at baseline with MS. Has new fever and is R hemi with speech changes that were pre-exisiting. Has chronic pain but also is wc bound.  PMHx:  cellulitis, HTN, CAD, MI, CABG, sternotomy, CHF, Parkinson's, septic and cardiogenic shock, RLS,   Clinical Impression  Pt was assessed for mobility and to see if he can return to [facility].  He is quite weak, requiring 2 person assist or mechanical lift to get up to chair.  Given his level of help needed will need to ck with his ALF to see if this is possible when medically ready to go home.  Follow acutely for strengthening and balance in sitting as he will need to be able to self propel to succeed in that environment.    Follow Up Recommendations SNF    Equipment Recommendations  None recommended by PT    Recommendations for Other Services       Precautions / Restrictions Precautions Precautions: Fall(telemetry) Precaution Comments: 118/77 BP sitting Restrictions Weight Bearing Restrictions: Yes      Mobility  Bed Mobility Overal bed mobility: Needs Assistance Bed Mobility: Supine to Sit;Sit to Supine     Supine to sit: Max assist;Mod assist Sit to supine: Max assist   General bed mobility comments: supported trunk and used bed pad for hips out, but leans L and assisted back to bed on LE's  Transfers Overall transfer level: Needs assistance Equipment used: 1 person hand held assist Transfers: Sit to/from Stand Sit to Stand: Max assist;Total assist         General transfer comment: partial standing with PT supporting pt, unable to weight bear on R ankle due to inversion contracture  Ambulation/Gait             General Gait Details: unable  Stairs            Wheelchair Mobility     Modified Rankin (Stroke Patients Only) Modified Rankin (Stroke Patients Only) Pre-Morbid Rankin Score: Moderate disability Modified Rankin: Moderately severe disability     Balance Overall balance assessment: Needs assistance Sitting-balance support: Feet supported;Bilateral upper extremity supported Sitting balance-Leahy Scale: Fair     Standing balance support: Bilateral upper extremity supported;During functional activity Standing balance-Leahy Scale: Zero                               Pertinent Vitals/Pain Pain Assessment: No/denies pain    Home Living Family/patient expects to be discharged to:: Skilled nursing facility                      Prior Function Level of Independence: Needs assistance   Gait / Transfers Assistance Needed: was able to stand pivot and self propel walker  ADL's / Homemaking Assistance Needed: ALF assisted with care and meds,   Comments: not recently walking per pt     Hand Dominance   Dominant Hand: Right    Extremity/Trunk Assessment   Upper Extremity Assessment Upper Extremity Assessment: RUE deficits/detail RUE Deficits / Details: flexion contracture R UE RUE Coordination: decreased fine motor;decreased gross motor    Lower Extremity Assessment Lower Extremity Assessment: RLE deficits/detail RLE Deficits / Details: flexion contracture in hip knee and ankle RLE Coordination: decreased  fine motor;decreased gross motor    Cervical / Trunk Assessment Cervical / Trunk Assessment: Kyphotic  Communication   Communication: Expressive difficulties  Cognition Arousal/Alertness: Awake/alert Behavior During Therapy: Flat affect Overall Cognitive Status: History of cognitive impairments - at baseline                                 General Comments: pt is in ALF with difficulty verbalizing PLOF      General Comments General comments (skin integrity, edema, etc.): B ankles are covered in pink soft  bandages to protect bony areas    Exercises     Assessment/Plan    PT Assessment Patient needs continued PT services  PT Problem List Decreased range of motion;Decreased strength;Decreased activity tolerance;Decreased balance;Decreased coordination;Decreased mobility;Decreased cognition;Decreased knowledge of use of DME;Decreased safety awareness;Decreased skin integrity       PT Treatment Interventions DME instruction;Functional mobility training;Therapeutic activities;Therapeutic exercise;Balance training;Neuromuscular re-education;Patient/family education    PT Goals (Current goals can be found in the Care Plan section)  Acute Rehab PT Goals Patient Stated Goal: none stated PT Goal Formulation: Patient unable to participate in goal setting Time For Goal Achievement: 10/22/17 Potential to Achieve Goals: Fair    Frequency Min 3X/week   Barriers to discharge Decreased caregiver support in ALF with no information about his need for support to get up to chair    Co-evaluation               AM-PAC PT "6 Clicks" Daily Activity  Outcome Measure Difficulty turning over in bed (including adjusting bedclothes, sheets and blankets)?: Unable Difficulty moving from lying on back to sitting on the side of the bed? : Unable Difficulty sitting down on and standing up from a chair with arms (e.g., wheelchair, bedside commode, etc,.)?: Unable Help needed moving to and from a bed to chair (including a wheelchair)?: Total Help needed walking in hospital room?: Total Help needed climbing 3-5 steps with a railing? : Total 6 Click Score: 6    End of Session Equipment Utilized During Treatment: Gait belt Activity Tolerance: Patient limited by fatigue;Patient limited by lethargy Patient left: in bed;with call bell/phone within reach;with bed alarm set Nurse Communication: Mobility status PT Visit Diagnosis: Unsteadiness on feet (R26.81);Muscle weakness (generalized) (M62.81);Adult, failure  to thrive (R62.7);Hemiplegia and hemiparesis Hemiplegia - Right/Left: Right Hemiplegia - dominant/non-dominant: Dominant Hemiplegia - caused by: Unspecified    Time: 1610-9604 PT Time Calculation (min) (ACUTE ONLY): 29 min   Charges:   PT Evaluation $PT Eval Moderate Complexity: 1 Mod PT Treatments $Therapeutic Activity: 8-22 mins       Ivar Drape 10/08/2017, 4:36 PM  Samul Dada, PT MS Acute Rehab Dept. Number: [facility] [phone] and [facility] [phone]

## 2017-10-09 MED ORDER — LORAZEPAM 2 MG/ML IJ SOLN
0.5000 mg | Freq: Once | INTRAMUSCULAR | Status: AC
  Administered 2017-10-09: 0.5 mg via INTRAVENOUS
  Filled 2017-10-09: qty 1

## 2017-10-09 MED ORDER — SENNOSIDES-DOCUSATE SODIUM 8.6-50 MG PO TABS: 2.0000 | ORAL_TABLET | Freq: Every evening | ORAL | Status: DC | PRN

## 2017-10-09 MED ORDER — LORAZEPAM 2 MG/ML IJ SOLN
1.0000 mg | Freq: Once | INTRAMUSCULAR | Status: AC
  Administered 2017-10-09: 1 mg via INTRAVENOUS
  Filled 2017-10-09: qty 1

## 2017-10-09 NOTE — Progress Notes (Signed)
CSW contacted by patient's daughter and informed that [facility] will be able to accept patient back. Patient's daughter reported that she spoke with Director and they informed her that they will be able to meet all of patient's care needs.  CS[facility] to confirm that they are able to accept patient back, no answer. CSW will continue to try to reach ALF to confirm patient's ability to return.  Celso SickleKimberly Markela Wee, [place] Clinical Social Worker [facility] Cell#: [phone]

## 2017-10-09 NOTE — Progress Notes (Signed)
Pt agitated, yelling out, continues to press call bell, all needs met. One time order of ativan 1 mg IV received and administered. Pt tolerated well.

## 2017-10-09 NOTE — NC FL2 (Signed)
[place] MEDICAID FL2 LEVEL OF CARE SCREENING TOOL     IDENTIFICATION  Patient Name: Jack Carter Birthdate: [birth year] Sex: male Admission Date (Current Location): 10/07/2017  [facility] and [place] Number:  Producer, television/film/video and Address:  [facility],  [address] [place], [place] [address]      Provider Number: [id number]  Attending Physician Name and Address:  Narda Bonds, MD  Relative Name and Phone Number:  Rhae Lerner [phone]    Current Level of Care: Hospital Recommended Level of Care: Skilled Nursing Facility Prior Approval Number:    Date Approved/Denied:   PASRR Number: [id number]  Discharge Plan: SNF    Current Diagnoses: Patient Active Problem List   Diagnosis Date Noted  . Cellulitis of right foot 10/08/2017  . UTI (urinary tract infection) 10/07/2017  . Parkinsonism (HCC) 07/16/2017  . Other dystonia 07/16/2017  . Anxiety and depression   . Acute cystitis with hematuria   . Acute lower UTI 02/19/2017  . Pneumonia 02/19/2017  . CAP (community acquired pneumonia) 02/18/2017  . 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 Acute tracheostomy management (HCC)   . Bradycardia, sinus   . Dyspnea   . Acute respiratory failure (HCC)   . Coronary artery disease involving native coronary artery of native heart without angina pectoris   . Acute on chronic combined systolic and diastolic CHF (congestive heart failure) (HCC)   . Great toe pain, unspecified laterality   . Aspiration into airway   . Hypernatremia   . Elevated troponin 11/04/2016  . Acute encephalopathy   . Goals of care, counseling/discussion   . Septic shock (HCC) 10/31/2016  . Cellulitis 10/31/2016  . Hyperkalemia 10/31/2016  . AKI (acute kidney injury) (HCC) 10/31/2016  . Cardiogenic shock (HCC) 10/31/2016  . Acute hypoxemic respiratory failure (HCC) 10/31/2016  . CAD (coronary artery disease) of artery bypass graft  10/31/2016  . CHF (congestive heart failure) (HCC) 10/31/2016  . Parkinson disease (HCC) 10/31/2016  . Upper GI bleed 10/31/2016    Orientation RESPIRATION BLADDER Height & Weight     Self, Place  Normal Incontinent Weight: 161 lb 9.6 oz (73.3 kg) Height:  5\' 9"  (175.3 cm)  BEHAVIORAL SYMPTOMS/MOOD NEUROLOGICAL BOWEL NUTRITION STATUS      Incontinent Diet(Heart)  AMBULATORY STATUS COMMUNICATION OF NEEDS Skin   Total Care Verbally Normal                       Personal Care Assistance Level of Assistance  Bathing, Feeding, Dressing Bathing Assistance: Maximum assistance Feeding assistance: Limited assistance Dressing Assistance: Maximum assistance     Functional Limitations Info  Sight, Hearing, Speech Sight Info: Adequate Hearing Info: Impaired Speech Info: Adequate    SPECIAL CARE FACTORS FREQUENCY  PT (By licensed PT), OT (By licensed OT)     PT Frequency: 5x/week OT Frequency: 5x/week            Contractures      Additional Factors Info  Code Status, Allergies Code Status Info: Full code Allergies Info: NKA           Current Medications (10/09/2017):  This is the current hospital active medication list Current Facility-Administered Medications  Medication Dose Route Frequency Provider Last Rate Last Dose  . acetaminophen (TYLENOL) tablet 650 mg  650 mg Oral Q6H PRN Emokpae, Ejiroghene E, MD   650 mg at 10/09/17 1018   Or  . acetaminophen (TYLENOL) suppository 650 mg  650 mg Rectal Q6H PRN Emokpae, Ejiroghene E, MD      . aspirin EC tablet 81 mg  81 mg Oral Daily Emokpae, Ejiroghene E, MD   81 mg at 10/09/17 1009  . atorvastatin (LIPITOR) tablet 10 mg  10 mg Oral QHS Emokpae, Ejiroghene E, MD   10 mg at 10/08/17 2227  . carbidopa-levodopa (SINEMET IR) 25-100 MG per tablet immediate release 2 tablet  2 tablet Oral 3 times per day Emokpae, Ejiroghene E, MD   2 tablet at 10/09/17 1009  . cefTRIAXone (ROCEPHIN) 1 g in sodium chloride 0.9 % 100 mL IVPB  1 g  Intravenous QHS Emokpae, Ejiroghene E, MD   Stopped at 10/08/17 2300  . Chlorhexidine Gluconate Cloth 2 % PADS 6 each  6 each Topical Q0600 Narda BondsNettey, Ralph A, MD   6 each at 10/09/17 [phone]  . citalopram (CELEXA) tablet 10 mg  10 mg Oral Daily Emokpae, Ejiroghene E, MD   10 mg at 10/09/17 1009  . clonazePAM (KLONOPIN) tablet 0.5 mg  0.5 mg Oral TID Emokpae, Ejiroghene E, MD   0.5 mg at 10/09/17 1009  . famotidine (PEPCID) tablet 20 mg  20 mg Oral Daily Emokpae, Ejiroghene E, MD   20 mg at 10/09/17 1009  . fentaNYL (DURAGESIC - dosed mcg/hr) patch 25 mcg  25 mcg Transdermal Q72H Emokpae, Ejiroghene E, MD   25 mcg at 10/07/17 2338  . heparin injection 5,000 Units  5,000 Units Subcutaneous Q8H Emokpae, Ejiroghene E, MD   5,000 Units at 10/09/17 0518  . metoprolol tartrate (LOPRESSOR) tablet 25 mg  25 mg Per Tube BID Emokpae, Ejiroghene E, MD   25 mg at 10/09/17 1009  . mupirocin ointment (BACTROBAN) 2 % 1 application  1 application Nasal BID Narda BondsNettey, Ralph A, MD   1 application at 10/09/17 1009  . ondansetron (ZOFRAN) tablet 4 mg  4 mg Oral Q6H PRN Emokpae, Ejiroghene E, MD       Or  . ondansetron (ZOFRAN) injection 4 mg  4 mg Intravenous Q6H PRN Emokpae, Ejiroghene E, MD      . polyethylene glycol (MIRALAX / GLYCOLAX) packet 17 g  17 g Oral Daily PRN Kirby-Graham, Beather ArbourKaren J, NP      . primidone (MYSOLINE) tablet 50 mg  50 mg Oral Q12H Emokpae, Ejiroghene E, MD   50 mg at 10/09/17 1009  . senna-docusate (Senokot-S) tablet 2 tablet  2 tablet Oral QHS PRN Narda BondsNettey, Ralph A, MD         Discharge Medications: Please see discharge summary for a list of discharge medications.  Relevant Imaging Results:  Relevant Lab Results:   Additional Information SS#: [id number]  Antionette PolesKimberly L Ticia Virgo, LCSW

## 2017-10-09 NOTE — Progress Notes (Signed)
Pt agitated, yelling, keeps calling call bell, this RN in room multiple times, all needs met, ativan 0.5 mg IV one time order received and given, no complications.

## 2017-10-09 NOTE — Clinical Social Work Note (Signed)
Clinical Social Work Assessment  Patient Details  Name: Jack Carter MRN: [id number] Date of Birth: 09/24/1933  Date of referral:  10/09/17               Reason for consult:  Facility Placement, Discharge Planning                Permission sought to share information with:    Permission granted to share information::     Name::        Agency::     Relationship::     Contact Information:     Housing/Transportation Living arrangements for the past 2 months:  Assisted Living Facility([facility]) Source of Information:  Adult Children(Daughter - Architectural technologistaige Crutcher) Patient Interpreter Needed:  None Criminal Activity/Legal Involvement Pertinent to Current Situation/Hospitalization:  No - Comment as needed Significant Relationships:  Adult Children Lives with:  Facility Resident Do you feel safe going back to the place where you live?  (PT recommending SNF) Need for family participation in patient care:  Yes (Comment)  Care giving concerns:  [facility] [phone]). Patient's daughter reported that patient moved to ALF after completing ST rehab in February. Patient's daughter reported that patient gets frequent UTIs and it impacts patient significantly (mentally and physically). PT recommending SNF. Per chart review, patient's daughter prefers that patient return to ALF. CSW contacted ALF and spoke with staff member Andrey CampanileSandy, staff reported that after speaking with ALF RN patient is unable to return in current condition.    Social Worker assessment / plan: CSW spoke with patient's daughter regarding PT recommendation for SNF and discharge planning. Patient's daughter informed CSW about recent issues with ALF offering additional services for patient to meet care needs then reporting that they are unable to accept patient back. Patient's daughter informed CSW that she feels the ALF is using the patient's hospitalization as an  excuse to not accept patient back to their facility instead of directly asking patient's daughter to move patient. Patient's daughter reported that she is agreeable to SNF for ST rehab if it is covered under Medicare benefit. CSW explained patient's Medicare coverage to patient's daughter, patient's daughter verbalized understanding. Patient's daughter reported that she is agreeable to patient discharging to SNF for ST rehab. CSW agreed to complete patient's FL2 and follow up with bed offers.  CSW will continue to follow and assist with discharge planning.   Employment status:  Retired Health and safety inspectornsurance information:  Medicare PT Recommendations:  Skilled Nursing Facility Information / Referral to community resources:  Skilled Nursing Facility  Patient/Family's Response to care:  Patient's daughter appreciative of CSW assistance with discharge planning.   Patient/Family's Understanding of and Emotional Response to Diagnosis, Current Treatment, and Prognosis:  Patient's daughter involved in patient's care and verbalized understanding of PT recommendation for SNF. Patient's daughter informed CSW about issues with ALF, noting that she was upset about how ALF is going about things. Patient's daughter reported that she is agreeable to patient dc to SNF for ST rehab.   Emotional Assessment Appearance:  Appears stated age Attitude/Demeanor/Rapport:  Unable to Assess Affect (typically observed):  Unable to Assess Orientation:  Oriented to Self, Oriented to Place Alcohol / Substance use:  Not Applicable Psych involvement (Current and /or in the community):  No (Comment)  Discharge Needs  Concerns to be addressed:  Care Coordination Readmission within the last 30 days:  No Current discharge risk:  Physical Impairment Barriers to Discharge:  Continued  Medical Work up   [employer], LCSW 10/09/2017, 9:35 AM

## 2017-10-09 NOTE — Clinical Social Work Placement (Signed)
CSW provided patient's daughter with bed offers. CSW awaiting SNF selection.  CLINICAL SOCIAL WORK PLACEMENT  NOTE  Date:  10/09/2017  Patient Details  Name: Jack Carter MRN: [id number] Date of Birth: 03/16/1933  Clinical Social Work is seeking post-discharge placement for this patient at the Skilled  Nursing Facility level of care (*CSW will initial, date and re-position this form in  chart as items are completed):  Yes   Patient/family provided with [place] Clinical Social Work Department's list of facilities offering this level of care within the geographic area requested by the patient (or if unable, by the patient's family).  Yes   Patient/family informed of their freedom to choose among providers that offer the needed level of care, that participate in Medicare, Medicaid or managed care program needed by the patient, have an available bed and are willing to accept the patient.  Yes   Patient/family informed of 's ownership interest in [facility] and [facility], as well as of the fact that they are under no obligation to receive care at these facilities.  PASRR submitted to EDS on       PASRR number received on       Existing PASRR number confirmed on 10/09/17     FL2 transmitted to all facilities in geographic area requested by pt/family on 10/09/17     FL2 transmitted to all facilities within larger geographic area on       Patient informed that his/her managed care company has contracts with or will negotiate with certain facilities, including the following:        Yes   Patient/family informed of bed offers received.  Patient chooses bed at       Physician recommends and patient chooses bed at      Patient to be transferred to   on  .  Patient to be transferred to facility by       Patient family notified on   of transfer.  Name of family member notified:        PHYSICIAN       Additional Comment:     _______________________________________________ Antionette PolesKimberly L Catrinia Racicot, LCSW 10/09/2017, 2:03 PM

## 2017-10-09 NOTE — Progress Notes (Signed)
PROGRESS NOTE    Jack Carter  UJW:[id number] DOB: 09/21/1933 DOA: 10/07/2017 PCP: Angela Coxasanayaka, Gayani Y, MD   Brief Narrative: Jack Carter is a 84 y.o. male with medical history significant for Parkinson's disease, septic and cardiogenic shock, CABG, systolic and diastolic CHF. He presented secondary to fever and found to have UTI.   Assessment & Plan:   Active Problems:   Parkinson disease (HCC)   UTI (urinary tract infection)   Cellulitis of right foot   UTI Symptoms of fever and dysuria. Started on Vancomycin/Zosyn initially and switched to Ceftriaxone. Urine culture significant for Klebsiella pneumoniae -Urine culture/blood culture -Continue Ceftriaxone  Parkinson's disease Unknown patient baseline. Will check with facility. -Continue Sinemet  Chronic medications Patient is on Fentanyl patch and Klonopin for unknown reason. According to ALF, he is on this medication for pain control from Parkinson's  Essential hypertension -Continue metoprolol  Chronic combined systolic and diastolic heart failure EF of 40-45% with grade 2 diastolic heart failure  CKD stage 3 Stable.  Right foot cellulitis -Ceftriaxone as mentioned above   DVT prophylaxis: Heparin Code Status:   Code Status: Full Code Family Communication: None at bedside Disposition Plan: Discharge to ALF vs SNF likely in 24 hours pending culture data   Consultants:   None  Procedures:   None  Antimicrobials:  Vancomycin (8/7)  Zosyn (8/7)  Ceftriaxone  (8/7>>   Subjective: Afebrile. Some agitation overnight.  Objective: Vitals:   10/08/17 2159 10/08/17 2225 10/09/17 0529 10/09/17 1337  BP:   (!) 157/51 (!) 146/57  Pulse:  60 (!) 54 (!) 48  Resp:   16 16  Temp: 98.3 F (36.8 C)  98.3 F (36.8 C) 98.6 F (37 C)  TempSrc: Oral  Oral Oral  SpO2:   98% 97%  Weight:      Height:        Intake/Output Summary (Last 24 hours) at 10/09/2017 1416 Last data filed at 10/09/2017  1025 Gross per 24 hour  Intake 379.89 ml  Output 800 ml  Net -420.11 ml   Filed Weights   10/07/17 1724 10/07/17 2303 10/08/17 0542  Weight: 66.7 kg 72.1 kg 73.3 kg    Examination:  General exam: Appears calm and comfortable Respiratory system: Clear to auscultation. Respiratory effort normal. Cardiovascular system: S1 & S2 heard, RRR. No murmurs, rubs, gallops or clicks. Gastrointestinal system: Abdomen is nondistended, soft and nontender. No organomegaly or masses felt. Normal bowel sounds heard. Central nervous system: Alert. Extremities: No edema. No calf tenderness. Foot contractures Skin: No cyanosis. Erythema over dorsum of right foot with no significant tenderness Psychiatry: Judgement and insight appear impaired. Flat affect    Data Reviewed: I have personally reviewed following labs and imaging studies  CBC: Recent Labs  Lab 10/07/17 1727 10/08/17 0511  WBC 10.7* 9.0  NEUTROABS 8.3*  --   HGB 10.7* 10.0*  HCT 33.4* 31.3*  MCV 97.1 97.2  PLT 122* 111*   Basic Metabolic Panel: Recent Labs  Lab 10/07/17 1727 10/08/17 0511  NA 142 140  K 4.0 4.0  CL 101 103  CO2 27 28  GLUCOSE 141* 102*  BUN 39* 37*  CREATININE 1.91* 1.69*  CALCIUM 9.0 8.7*   GFR: Estimated Creatinine Clearance: 33.7 mL/min (A) (by C-G formula based on SCr of 1.69 mg/dL (H)). Liver Function Tests: Recent Labs  Lab 10/07/17 1727  AST 26  ALT 11  ALKPHOS 122  BILITOT 1.1  PROT 7.7  ALBUMIN 3.4*   No results  for input(s): LIPASE, AMYLASE in the last 168 hours. No results for input(s): AMMONIA in the last 168 hours. Coagulation Profile: Recent Labs  Lab 10/07/17 1727  INR 1.31   Cardiac Enzymes: No results for input(s): CKTOTAL, CKMB, CKMBINDEX, TROPONINI in the last 168 hours. BNP (last 3 results) No results for input(s): PROBNP in the last 8760 hours. HbA1C: No results for input(s): HGBA1C in the last 72 hours. CBG: No results for input(s): GLUCAP in the last 168  hours. Lipid Profile: No results for input(s): CHOL, HDL, LDLCALC, TRIG, CHOLHDL, LDLDIRECT in the last 72 hours. Thyroid Function Tests: No results for input(s): TSH, T4TOTAL, FREET4, T3FREE, THYROIDAB in the last 72 hours. Anemia Panel: No results for input(s): VITAMINB12, FOLATE, FERRITIN, TIBC, IRON, RETICCTPCT in the last 72 hours. Sepsis Labs: Recent Labs  Lab 10/07/17 1752 10/07/17 2222  LATICACIDVEN 1.91* 1.32    Recent Results (from the past 240 hour(s))  Culture, blood (Routine x 2)     Status: None (Preliminary result)   Collection Time: 10/07/17  5:27 PM  Result Value Ref Range Status   Specimen Description   Final    BLOOD RIGHT ANTECUBITAL Performed at [facility], [address], [place], [place] [address]    Special Requests   Final    BOTTLES DRAWN AEROBIC AND ANAEROBIC Blood Culture adequate volume Performed at [facility], [address] [place], [place] [address]    Culture   Final    NO GROWTH < 24 HOURS Performed at [facility], [address] [place], [place] [address]    Report Status PENDING  Incomplete  Culture, blood (Routine x 2)     Status: None (Preliminary result)   Collection Time: 10/07/17  5:32 PM  Result Value Ref Range Status   Specimen Description   Final    BLOOD BLOOD LEFT FOREARM Performed at [facility], [address], [place], [place] [address]    Special Requests   Final    BOTTLES DRAWN AEROBIC AND ANAEROBIC Blood Culture adequate volume Performed at [facility], [address], [place], [place] [address]    Culture   Final    NO GROWTH < 24 HOURS Performed at [facility], [address], [place], [place] [address]    Report Status PENDING  Incomplete  Urine culture     Status: Abnormal (Preliminary result)   Collection Time: 10/07/17  7:50 PM  Result Value Ref Range Status   Specimen Description   Final    URINE,  RANDOM Performed at [facility], [address], [place], [place] [address]    Special Requests   Final    NONE Performed at [facility], [address], [place], [place] [address]    Culture >=100,000 COLONIES/mL KLEBSIELLA PNEUMONIAE (A)  Final   Report Status PENDING  Incomplete  MRSA PCR Screening     Status: Abnormal   Collection Time: 10/08/17  5:33 AM  Result Value Ref Range Status   MRSA by PCR POSITIVE (A) NEGATIVE Final    Comment:        The GeneXpert MRSA Assay (FDA approved for NASAL specimens only), is one component of a comprehensive MRSA colonization surveillance program. It is not intended to diagnose MRSA infection nor to guide or monitor treatment for MRSA infections. RESULT CALLED TO, READ BACK BY AND VERIFIED WITH: NELSON,A. RN @0721  ON 8.8.19 BY NMCCOY Performed at [facility], [address] [place], [place] [address]          Radiology Studies: Dg Chest 2 View  Result Date: 10/07/2017 CLINICAL DATA:  84 year old male with a history of generalized pain EXAM: CHEST - 2 VIEW COMPARISON:  02/18/2017, 11/29/2016 FINDINGS: Cardiomediastinal silhouette unchanged in size and contour. Surgical changes of median sternotomy and CABG. Low lung volumes.  No pleural effusion or pneumothorax. Similar appearance of coarsened interstitial markings with reticular opacity, without new airspace disease. Degenerative changes with no acute displaced fracture. IMPRESSION: Chronic lung changes without evidence of lobar pneumonia. Surgical changes of median sternotomy and CABG Electronically Signed   By: Gilmer Mor D.O.   On: 10/07/2017 18:58        Scheduled Meds: . aspirin EC  81 mg Oral Daily  . atorvastatin  10 mg Oral QHS  . carbidopa-levodopa  2 tablet Oral 3 times per day  . Chlorhexidine Gluconate Cloth  6 each Topical Q0600  . citalopram  10 mg Oral Daily  . clonazePAM  0.5 mg Oral TID  .  famotidine  20 mg Oral Daily  . fentaNYL  25 mcg Transdermal Q72H  . heparin  5,000 Units Subcutaneous Q8H  . metoprolol tartrate  25 mg Per Tube BID  . mupirocin ointment  1 application Nasal BID  . primidone  50 mg Oral Q12H   Continuous Infusions: . cefTRIAXone (ROCEPHIN)  IV Stopped (10/08/17 2300)     LOS: 2 days     Jacquelin Hawking, MD [facility] 10/09/2017, 2:16 PM Pager: [phone]  If 7PM-7AM, please contact night-coverage [email or web address] 10/09/2017, 2:16 PM

## 2017-10-10 LAB — URINE CULTURE: Culture: >=100000 — AB

## 2017-10-10 MED ORDER — GABAPENTIN 400 MG PO CAPS: 500.0000 mg | ORAL_CAPSULE | Freq: Three times a day (TID) | ORAL | Status: DC

## 2017-10-10 MED ORDER — SODIUM CHLORIDE 0.9 % IV SOLN
INTRAVENOUS | Status: DC | PRN
  Administered 2017-10-10 – 2017-10-13 (×3): 250 mL via INTRAVENOUS

## 2017-10-10 MED ORDER — POLYETHYLENE GLYCOL 3350 17 G PO PACK
17.0000 g | PACK | Freq: Every day | ORAL | Status: DC
Start: 2017-10-10 — End: 2017-10-14
  Administered 2017-10-10 – 2017-10-14 (×5): 17 g via ORAL
  Filled 2017-10-10 (×5): qty 1

## 2017-10-10 MED ORDER — MORPHINE SULFATE (PF) 4 MG/ML IV SOLN
4.0000 mg | INTRAVENOUS | Status: AC | PRN
  Administered 2017-10-10 (×2): 4 mg via INTRAVENOUS
  Filled 2017-10-10 (×2): qty 1

## 2017-10-10 MED ORDER — SODIUM CHLORIDE 0.9 % IV SOLN
1.0000 g | Freq: Two times a day (BID) | INTRAVENOUS | Status: DC
Start: 2017-10-10 — End: 2017-10-14
  Administered 2017-10-10 – 2017-10-14 (×9): 1 g via INTRAVENOUS
  Filled 2017-10-10 (×10): qty 1

## 2017-10-10 MED ORDER — MORPHINE SULFATE (PF) 4 MG/ML IV SOLN
4.0000 mg | INTRAVENOUS | Status: AC | PRN
  Administered 2017-10-10 – 2017-10-11 (×3): 4 mg via INTRAVENOUS
  Filled 2017-10-10 (×3): qty 1

## 2017-10-10 MED ORDER — GABAPENTIN 400 MG PO CAPS
500.0000 mg | ORAL_CAPSULE | Freq: Two times a day (BID) | ORAL | Status: DC
  Administered 2017-10-10 – 2017-10-14 (×9): 500 mg via ORAL
  Filled 2017-10-10 (×9): qty 1

## 2017-10-10 NOTE — Progress Notes (Signed)
Verbal order with read back received for morphine 4 mg IV now and again in 4 hours for pain from K. Schorr.

## 2017-10-10 NOTE — Progress Notes (Signed)
PROGRESS NOTE    Jack Carter  NFA:[id number] DOB: 05/30/1933 DOA: 10/07/2017 PCP: [name], MD   Brief Narrative: Jack Carter is a 84 y.o. male with medical history significant for Parkinson's disease, septic and cardiogenic shock, CABG, systolic and diastolic CHF. He presented secondary to fever and found to have UTI.   Assessment & Plan:   Active Problems:   Parkinson disease (HCC)   UTI (urinary tract infection)   Cellulitis of right foot   UTI Symptoms of fever and dysuria. Started on Vancomycin/Zosyn initially and switched to Ceftriaxone. Urine culture significant for Klebsiella pneumoniae, ESBL -Urine culture/blood culture -Disontinue Ceftriaxone -Start meropenem (Day #1/7)  Parkinson's disease Unknown patient baseline. Will check with facility. -Continue Sinemet  Chronic medications Patient is on Fentanyl patch and Klonopin for unknown reason. According to ALF, he is on this medication for pain control from Parkinson's -Will add back gabapentin secondary to increased pain  Essential hypertension -Continue metoprolol  Chronic combined systolic and diastolic heart failure EF of 40-45% with grade 2 diastolic heart failure  CKD stage 3 Stable.  Right foot cellulitis Improved -antibiotics switched to meropenem   DVT prophylaxis: Heparin Code Status:   Code Status: Full Code Family Communication: None at bedside Disposition Plan: Discharge to SNF as patient will need IV antibiotics   Consultants:   None  Procedures:   None  Antimicrobials:  Vancomycin (8/7)  Zosyn (8/7)  Ceftriaxone  (8/7>>8/9)  Meropenem (8/10>>   Subjective: Agitated. No other issues.  Objective: Vitals:   10/09/17 2101 10/10/17 0537 10/10/17 1126 10/10/17 1228  BP:  (!) 150/65 (!) 156/81 (!) 154/65  Pulse: 60 (!) 53 (!) 59 (!) 52  Resp:  20  16  Temp:  98.2 F (36.8 C)  98 F (36.7 C)  TempSrc:  Oral  Axillary  SpO2:  100%  95%  Weight:       Height:        Intake/Output Summary (Last 24 hours) at 10/10/2017 1243 Last data filed at 10/10/2017 1228 Gross per 24 hour  Intake 420 ml  Output 677 ml  Net -257 ml   Filed Weights   10/07/17 1724 10/07/17 2303 10/08/17 0542  Weight: 66.7 kg 72.1 kg 73.3 kg    Examination:  General exam: Appears calm and comfortable Respiratory system: Clear to auscultation. Respiratory effort normal. Cardiovascular system: S1 & S2 heard, RRR. No murmurs, rubs, gallops or clicks. Gastrointestinal system: Abdomen is nondistended, soft and nontender. No organomegaly or masses felt. Normal bowel sounds heard. Central nervous system: Alert Extremities: No edema. No calf tenderness. Contractures of feet Skin: No cyanosis. Right foot erythema improved.    Data Reviewed: I have personally reviewed following labs and imaging studies  CBC: Recent Labs  Lab 10/07/17 1727 10/08/17 0511  WBC 10.7* 9.0  NEUTROABS 8.3*  --   HGB 10.7* 10.0*  HCT 33.4* 31.3*  MCV 97.1 97.2  PLT 122* 111*   Basic Metabolic Panel: Recent Labs  Lab 10/07/17 1727 10/08/17 0511  NA 142 140  K 4.0 4.0  CL 101 103  CO2 27 28  GLUCOSE 141* 102*  BUN 39* 37*  CREATININE 1.91* 1.69*  CALCIUM 9.0 8.7*   GFR: Estimated Creatinine Clearance: 33.7 mL/min (A) (by C-G formula based on SCr of 1.69 mg/dL (H)). Liver Function Tests: Recent Labs  Lab 10/07/17 1727  AST 26  ALT 11  ALKPHOS 122  BILITOT 1.1  PROT 7.7  ALBUMIN 3.4*   No results  for input(s): LIPASE, AMYLASE in the last 168 hours. No results for input(s): AMMONIA in the last 168 hours. Coagulation Profile: Recent Labs  Lab 10/07/17 1727  INR 1.31   Cardiac Enzymes: No results for input(s): CKTOTAL, CKMB, CKMBINDEX, TROPONINI in the last 168 hours. BNP (last 3 results) No results for input(s): PROBNP in the last 8760 hours. HbA1C: No results for input(s): HGBA1C in the last 72 hours. CBG: No results for input(s): GLUCAP in the last  168 hours. Lipid Profile: No results for input(s): CHOL, HDL, LDLCALC, TRIG, CHOLHDL, LDLDIRECT in the last 72 hours. Thyroid Function Tests: No results for input(s): TSH, T4TOTAL, FREET4, T3FREE, THYROIDAB in the last 72 hours. Anemia Panel: No results for input(s): VITAMINB12, FOLATE, FERRITIN, TIBC, IRON, RETICCTPCT in the last 72 hours. Sepsis Labs: Recent Labs  Lab 10/07/17 1752 10/07/17 2222  LATICACIDVEN 1.91* 1.32    Recent Results (from the past 240 hour(s))  Culture, blood (Routine x 2)     Status: None (Preliminary result)   Collection Time: 10/07/17  5:27 PM  Result Value Ref Range Status   Specimen Description   Final    BLOOD RIGHT ANTECUBITAL Performed at [facility], [address] [place], [place] [address]    Special Requests   Final    BOTTLES DRAWN AEROBIC AND ANAEROBIC Blood Culture adequate volume Performed at [facility], [address], [place], [place] [address]    Culture   Final    NO GROWTH 3 DAYS Performed at [facility], [address], [place], [place] [address]    Report Status PENDING  Incomplete  Culture, blood (Routine x 2)     Status: None (Preliminary result)   Collection Time: 10/07/17  5:32 PM  Result Value Ref Range Status   Specimen Description   Final    BLOOD BLOOD LEFT FOREARM Performed at [facility], [address] [place], [place] [address]    Special Requests   Final    BOTTLES DRAWN AEROBIC AND ANAEROBIC Blood Culture adequate volume Performed at [facility], [address] [place], [place] [address]    Culture   Final    NO GROWTH 3 DAYS Performed at [facility], [address] [place], [place] [address]    Report Status PENDING  Incomplete  Urine culture     Status: Abnormal   Collection Time: 10/07/17  7:50 PM  Result Value Ref Range Status   Specimen Description   Final    URINE, RANDOM Performed at [facility], [address], [place], [place] [address]    Special Requests   Final    NONE Performed at [facility], [address] [place], [place] [address]    Culture (A)  Final    >=100,000 COLONIES/mL KLEBSIELLA PNEUMONIAE Confirmed Extended Spectrum Beta-Lactamase Producer (ESBL).  In bloodstream infections from ESBL organisms, carbapenems are preferred over piperacillin/tazobactam. They are shown to have a lower risk of mortality.    Report Status 10/10/2017 FINAL  Final   Organism ID, Bacteria KLEBSIELLA PNEUMONIAE (A)  Final      Susceptibility   Klebsiella pneumoniae - MIC*    AMPICILLIN >=32 RESISTANT Resistant     CEFAZOLIN >=64 RESISTANT Resistant     CEFTRIAXONE >=64 RESISTANT Resistant     CIPROFLOXACIN >=4 RESISTANT Resistant     GENTAMICIN >=16 RESISTANT Resistant     IMIPENEM 0.5 SENSITIVE Sensitive  NITROFURANTOIN 32 SENSITIVE Sensitive     TRIMETH/SULFA >=320 RESISTANT Resistant     AMPICILLIN/SULBACTAM >=32 RESISTANT Resistant     PIP/TAZO >=128 RESISTANT Resistant     Extended ESBL POSITIVE Resistant     * >=100,000 COLONIES/mL KLEBSIELLA PNEUMONIAE  MRSA PCR Screening     Status: Abnormal   Collection Time: 10/08/17  5:33 AM  Result Value Ref Range Status   MRSA by PCR POSITIVE (A) NEGATIVE Final    Comment:        The GeneXpert MRSA Assay (FDA approved for NASAL specimens only), is one component of a comprehensive MRSA colonization surveillance program. It is not intended to diagnose MRSA infection nor to guide or monitor treatment for MRSA infections. RESULT CALLED TO, READ BACK BY AND VERIFIED WITH: Trilby LeaverNELSON,A. RN @0721  ON 8.8.19 BY [facility] Performed at [facility], [address] [place], [place] [id number]          Radiology Studies: No results found.      Scheduled Meds: . aspirin EC  81 mg Oral Daily  . atorvastatin  10 mg Oral QHS  . carbidopa-levodopa  2 tablet Oral 3 times  per day  . Chlorhexidine Gluconate Cloth  6 each Topical Q0600  . citalopram  10 mg Oral Daily  . clonazePAM  0.5 mg Oral TID  . famotidine  20 mg Oral Daily  . fentaNYL  25 mcg Transdermal Q72H  . gabapentin  500 mg Oral TID  . heparin  5,000 Units Subcutaneous Q8H  . metoprolol tartrate  25 mg Per Tube BID  . mupirocin ointment  1 application Nasal BID  . polyethylene glycol  17 g Oral Daily  . primidone  50 mg Oral Q12H   Continuous Infusions: . meropenem (MERREM) IV 1 g (10/10/17 1127)     LOS: 3 days     Jacquelin Hawkingalph Brandilee Pies, MD [facility] 10/10/2017, 12:43 PM Pager: [phone]  If 7PM-7AM, please contact night-coverage [email or web address] 10/10/2017, 12:43 PM

## 2017-10-11 ENCOUNTER — Encounter (HOSPITAL_COMMUNITY): Payer: Self-pay

## 2017-10-11 DIAGNOSIS — I5042 Chronic combined systolic (congestive) and diastolic (congestive) heart failure: Secondary | ICD-10-CM

## 2017-10-11 DIAGNOSIS — I10 Essential (primary) hypertension: Secondary | ICD-10-CM

## 2017-10-11 LAB — CBC
HCT: 31.1 % — ABNORMAL LOW (ref 39.0–52.0)
Hemoglobin: 9.9 g/dL — ABNORMAL LOW (ref 13.0–17.0)
MCH: 31.1 pg (ref 26.0–34.0)
MCHC: 31.8 g/dL (ref 30.0–36.0)
MCV: 97.8 fL (ref 78.0–100.0)
PLATELETS: 111 10*3/uL — AB (ref 150–400)
RBC: 3.18 MIL/uL — ABNORMAL LOW (ref 4.22–5.81)
RDW: 13.9 % (ref 11.5–15.5)
WBC: 5.5 10*3/uL (ref 4.0–10.5)

## 2017-10-11 LAB — BASIC METABOLIC PANEL
ANION GAP: 8 (ref 5–15)
BUN: 25 mg/dL — AB (ref 8–23)
CALCIUM: 8.6 mg/dL — AB (ref 8.9–10.3)
CO2: 26 mmol/L (ref 22–32)
Chloride: 105 mmol/L (ref 98–111)
Creatinine, Ser: 1.12 mg/dL (ref 0.61–1.24)
GFR calc Af Amer: >60 mL/min (ref >60)
GFR, EST NON AFRICAN AMERICAN: 59 mL/min — AB (ref >60)
Glucose, Bld: 142 mg/dL — ABNORMAL HIGH (ref 70–99)
Potassium: 4.6 mmol/L (ref 3.5–5.1)
SODIUM: 139 mmol/L (ref 135–145)

## 2017-10-11 MED ORDER — TRAMADOL HCL 50 MG PO TABS
50.0000 mg | ORAL_TABLET | Freq: Two times a day (BID) | ORAL | Status: DC
  Administered 2017-10-11 – 2017-10-14 (×8): 50 mg via ORAL
  Filled 2017-10-11 (×8): qty 1

## 2017-10-11 NOTE — Progress Notes (Signed)
PROGRESS NOTE    ERI PLATTEN  ZHY:[id number] DOB: 1933/10/07 DOA: 10/07/2017 PCP: [name], MD   Brief Narrative: Jack Carter is a 84 y.o. male with medical history significant for Parkinson's disease, septic and cardiogenic shock, CABG, systolic and diastolic CHF. He presented secondary to fever and found to have UTI.   Assessment & Plan:   Principal Problem:   UTI due to Klebsiella species Active Problems:   Parkinson disease (HCC)   Cellulitis of right foot   Essential hypertension   Chronic combined systolic and diastolic CHF (congestive heart failure) (HCC)   UTI Symptoms of fever and dysuria. Started on Vancomycin/Zosyn initially and switched to Ceftriaxone. Urine culture significant for Klebsiella pneumoniae, ESBL. Blood culture with no growth -Continue meropenem (7 day course)  Parkinson's disease Unknown patient baseline. Will check with facility. -Continue Sinemet  Chronic medications Patient is on Fentanyl patch and Klonopin for unknown reason. According to ALF, he is on this medication for pain control from Parkinson's -Continue gabapentin and add home Tramadol  Essential hypertension -Continue metoprolol  Chronic combined systolic and diastolic heart failure EF of 40-45% with grade 2 diastolic heart failure  CKD stage 3 Stable.  Right foot cellulitis Improved -antibiotics switched to meropenem as mentioned above   DVT prophylaxis: Heparin Code Status:   Code Status: Full Code Family Communication: None at bedside Disposition Plan: Discharge to SNF as patient will need IV antibiotics unless ALF will take with a PICC/midline   Consultants:   None  Procedures:   None  Antimicrobials:  Vancomycin (8/7)  Zosyn (8/7)  Ceftriaxone  (8/7>>8/9)  Meropenem (8/10>>   Subjective: Agitated. No other issues.  Objective: Vitals:   10/10/17 1126 10/10/17 1228 10/10/17 2004 10/11/17 0416  BP: (!) 156/81 (!) 154/65 (!)  163/74 (!) 141/62  Pulse: (!) 59 (!) 52 60 (!) 50  Resp:  16 12 12   Temp:  98 F (36.7 C)    TempSrc:  Axillary    SpO2:  95% 100% 99%  Weight:      Height:        Intake/Output Summary (Last 24 hours) at 10/11/2017 1140 Last data filed at 10/11/2017 1044 Gross per 24 hour  Intake 746.97 ml  Output 1125 ml  Net -378.03 ml   Filed Weights   10/07/17 1724 10/07/17 2303 10/08/17 0542  Weight: 66.7 kg 72.1 kg 73.3 kg    Examination:  General: Well appearing, no distress  Data Reviewed: I have personally reviewed following labs and imaging studies  CBC: Recent Labs  Lab 10/07/17 1727 10/08/17 0511  WBC 10.7* 9.0  NEUTROABS 8.3*  --   HGB 10.7* 10.0*  HCT 33.4* 31.3*  MCV 97.1 97.2  PLT 122* 111*   Basic Metabolic Panel: Recent Labs  Lab 10/07/17 1727 10/08/17 0511  NA 142 140  K 4.0 4.0  CL 101 103  CO2 27 28  GLUCOSE 141* 102*  BUN 39* 37*  CREATININE 1.91* 1.69*  CALCIUM 9.0 8.7*   GFR: Estimated Creatinine Clearance: 33.7 mL/min (A) (by C-G formula based on SCr of 1.69 mg/dL (H)). Liver Function Tests: Recent Labs  Lab 10/07/17 1727  AST 26  ALT 11  ALKPHOS 122  BILITOT 1.1  PROT 7.7  ALBUMIN 3.4*   No results for input(s): LIPASE, AMYLASE in the last 168 hours. No results for input(s): AMMONIA in the last 168 hours. Coagulation Profile: Recent Labs  Lab 10/07/17 1727  INR 1.31   Cardiac Enzymes: No  results for input(s): CKTOTAL, CKMB, CKMBINDEX, TROPONINI in the last 168 hours. BNP (last 3 results) No results for input(s): PROBNP in the last 8760 hours. HbA1C: No results for input(s): HGBA1C in the last 72 hours. CBG: No results for input(s): GLUCAP in the last 168 hours. Lipid Profile: No results for input(s): CHOL, HDL, LDLCALC, TRIG, CHOLHDL, LDLDIRECT in the last 72 hours. Thyroid Function Tests: No results for input(s): TSH, T4TOTAL, FREET4, T3FREE, THYROIDAB in the last 72 hours. Anemia Panel: No results for input(s):  VITAMINB12, FOLATE, FERRITIN, TIBC, IRON, RETICCTPCT in the last 72 hours. Sepsis Labs: Recent Labs  Lab 10/07/17 1752 10/07/17 2222  LATICACIDVEN 1.91* 1.32    Recent Results (from the past 240 hour(s))  Culture, blood (Routine x 2)     Status: None (Preliminary result)   Collection Time: 10/07/17  5:27 PM  Result Value Ref Range Status   Specimen Description   Final    BLOOD RIGHT ANTECUBITAL Performed at [facility], [address] [place], [place] [address]    Special Requests   Final    BOTTLES DRAWN AEROBIC AND ANAEROBIC Blood Culture adequate volume Performed at [facility], [address] [place], [place] [address]    Culture   Final    NO GROWTH 3 DAYS Performed at [facility], [address] [place], [place] [address]    Report Status PENDING  Incomplete  Culture, blood (Routine x 2)     Status: None (Preliminary result)   Collection Time: 10/07/17  5:32 PM  Result Value Ref Range Status   Specimen Description   Final    BLOOD BLOOD LEFT FOREARM Performed at [facility], [address] [place], [place] [address]    Special Requests   Final    BOTTLES DRAWN AEROBIC AND ANAEROBIC Blood Culture adequate volume Performed at [facility], [address] [place], [place] [address]    Culture   Final    NO GROWTH 3 DAYS Performed at [facility], [address] [place], [place] [address]    Report Status PENDING  Incomplete  Urine culture     Status: Abnormal   Collection Time: 10/07/17  7:50 PM  Result Value Ref Range Status   Specimen Description   Final    URINE, RANDOM Performed at [facility], [address] [place], [place] [address]    Special Requests   Final    NONE Performed at [facility], [address] [place], [place] [address]    Culture (A)  Final    >=100,000 COLONIES/mL KLEBSIELLA PNEUMONIAE Confirmed  Extended Spectrum Beta-Lactamase Producer (ESBL).  In bloodstream infections from ESBL organisms, carbapenems are preferred over piperacillin/tazobactam. They are shown to have a lower risk of mortality.    Report Status 10/10/2017 FINAL  Final   Organism ID, Bacteria KLEBSIELLA PNEUMONIAE (A)  Final      Susceptibility   Klebsiella pneumoniae - MIC*    AMPICILLIN >=32 RESISTANT Resistant     CEFAZOLIN >=64 RESISTANT Resistant     CEFTRIAXONE >=64 RESISTANT Resistant     CIPROFLOXACIN >=4 RESISTANT Resistant     GENTAMICIN >=16 RESISTANT Resistant     IMIPENEM 0.5 SENSITIVE Sensitive     NITROFURANTOIN 32 SENSITIVE Sensitive     TRIMETH/SULFA >=320 RESISTANT Resistant     AMPICILLIN/SULBACTAM >=32 RESISTANT Resistant     PIP/TAZO >=128 RESISTANT Resistant     Extended  ESBL POSITIVE Resistant     * >=100,000 COLONIES/mL KLEBSIELLA PNEUMONIAE  MRSA PCR Screening     Status: Abnormal   Collection Time: 10/08/17  5:33 AM  Result Value Ref Range Status   MRSA by PCR POSITIVE (A) NEGATIVE Final    Comment:        The GeneXpert MRSA Assay (FDA approved for NASAL specimens only), is one component of a comprehensive MRSA colonization surveillance program. It is not intended to diagnose MRSA infection nor to guide or monitor treatment for MRSA infections. RESULT CALLED TO, READ BACK BY AND VERIFIED WITH: NELSON,A. RN @0721  ON 8.8.19 BY [facility] Performed at [facility], [address] [place], [place] [address]          Radiology Studies: No results found.      Scheduled Meds: . aspirin EC  81 mg Oral Daily  . atorvastatin  10 mg Oral QHS  . carbidopa-levodopa  2 tablet Oral 3 times per day  . Chlorhexidine Gluconate Cloth  6 each Topical Q0600  . citalopram  10 mg Oral Daily  . clonazePAM  0.5 mg Oral TID  . famotidine  20 mg Oral Daily  . fentaNYL  25 mcg Transdermal Q72H  . gabapentin  500 mg Oral BID  . heparin  5,000 Units Subcutaneous Q8H  .  metoprolol tartrate  25 mg Per Tube BID  . mupirocin ointment  1 application Nasal BID  . polyethylene glycol  17 g Oral Daily  . primidone  50 mg Oral Q12H  . traMADol  50 mg Oral BID   Continuous Infusions: . sodium chloride 10 mL/hr at 10/11/17 0445  . meropenem (MERREM) IV 1 g (10/11/17 0948)     LOS: 4 days     Jacquelin Hawking, MD [facility] 10/11/2017, 11:40 AM Pager: [phone]  If 7PM-7AM, please contact night-coverage [email or web address] 10/11/2017, 11:40 AM

## 2017-10-12 LAB — CULTURE, BLOOD (ROUTINE X 2)
Culture: NO GROWTH
Culture: NO GROWTH
SPECIAL REQUESTS: ADEQUATE
Special Requests: ADEQUATE

## 2017-10-12 MED ORDER — RESOURCE THICKENUP CLEAR PO POWD
ORAL | Status: DC | PRN
  Filled 2017-10-12: qty 125

## 2017-10-12 NOTE — Progress Notes (Signed)
LCSW spoke with facility and patients dtr this morning. Both are agreeable to SNF for the remainder of IV antibiotics.   Daughter is still hesitant about SNF and worried about his Medicare days for rehab. Daughter feels that patient will not be able to return to 1 assist due to advanced Parkinson's.   If patient dc's to SNF for IV antibiotics daughter prefers [facility].  Patient is not medically stable today. LCSW will continue to follow for disposition.   Jack Carter, LSCW [facility] CSW [phone]

## 2017-10-12 NOTE — Progress Notes (Signed)
PROGRESS NOTE    Jack Carter  ZOX:[id number] DOB: 07/06/1933 DOA: 10/07/2017 PCP: [name], MD   Brief Narrative: Jack Carter is a 84 y.o. male with medical history significant for Parkinson's disease, septic and cardiogenic shock, CABG, systolic and diastolic CHF. He presented secondary to fever and found to have UTI.   Assessment & Plan:   Principal Problem:   UTI due to Klebsiella species Active Problems:   Parkinson disease (HCC)   Cellulitis of right foot   Essential hypertension   Chronic combined systolic and diastolic CHF (congestive heart failure) (HCC)   UTI Symptoms of fever and dysuria. Started on Vancomycin/Zosyn initially and switched to Ceftriaxone. Urine culture significant for Klebsiella pneumoniae, ESBL. Blood culture with no growth. -Continue meropenem (7 day course)  Parkinson's disease Unknown patient baseline. Will check with facility. -Continue Sinemet  Dysphagia Silent aspiration in passed evaluations. History of PEG. -Palliative care -SLP eval  Chronic medications Patient is on Fentanyl patch and Klonopin for unknown reason. According to ALF, he is on this medication for pain control from Parkinson's -Continue gabapentin and Tramadol  Essential hypertension -Continue metoprolol  Chronic combined systolic and diastolic heart failure EF of 40-45% with grade 2 diastolic heart failure  CKD stage 3 Stable.  Right foot cellulitis Nearly resolved -antibiotics switched to meropenem as mentioned above   DVT prophylaxis: Heparin Code Status:   Code Status: Full Code Family Communication: None at bedside Disposition Plan: Discharge to SNF as patient will need IV antibiotics unless ALF will take with a PICC/midline   Consultants:   None  Procedures:   None  Antimicrobials:  Vancomycin (8/7)  Zosyn (8/7)  Ceftriaxone  (8/7>>8/9)  Meropenem (8/10>>   Subjective: Angry about his food being taken  away  Objective: Vitals:   10/11/17 0416 10/11/17 1448 10/11/17 1958 10/12/17 0426  BP: (!) 141/62 (!) 120/55 127/60 (!) 123/59  Pulse: (!) 50 (!) 50 (!) 50 (!) 48  Resp: 12 16 12 12   Temp:  97.7 F (36.5 C)    TempSrc:  Oral    SpO2: 99% 100% 98% 100%  Weight:      Height:        Intake/Output Summary (Last 24 hours) at 10/12/2017 1246 Last data filed at 10/12/2017 0944 Gross per 24 hour  Intake 402.2 ml  Output 645 ml  Net -242.8 ml   Filed Weights   10/07/17 1724 10/07/17 2303 10/08/17 0542  Weight: 66.7 kg 72.1 kg 73.3 kg    Examination:  General exam: Appears calm and comfortable Respiratory system: Clear to auscultation. Respiratory effort normal. Cardiovascular system: S1 & S2 heard, RRR. No murmurs. Gastrointestinal system: Abdomen is nondistended, soft and nontender. No organomegaly or masses felt. Normal bowel sounds heard. Central nervous system: Alert and oriented to person, place and time. Extremities: No edema. No calf tenderness, contractures of feet Skin: No cyanosis. Right foot erythema almost resolved Psychiatry: Judgement and insight appear normal. Agitated  Data Reviewed: I have personally reviewed following labs and imaging studies  CBC: Recent Labs  Lab 10/07/17 1727 10/08/17 0511 10/11/17 1211  WBC 10.7* 9.0 5.5  NEUTROABS 8.3*  --   --   HGB 10.7* 10.0* 9.9*  HCT 33.4* 31.3* 31.1*  MCV 97.1 97.2 97.8  PLT 122* 111* 111*   Basic Metabolic Panel: Recent Labs  Lab 10/07/17 1727 10/08/17 0511 10/11/17 1211  NA 142 140 139  K 4.0 4.0 4.6  CL 101 103 105  CO2 27 28  26  GLUCOSE 141* 102* 142*  BUN 39* 37* 25*  CREATININE 1.91* 1.69* 1.12  CALCIUM 9.0 8.7* 8.6*   GFR: Estimated Creatinine Clearance: 50.9 mL/min (by C-G formula based on SCr of 1.12 mg/dL). Liver Function Tests: Recent Labs  Lab 10/07/17 1727  AST 26  ALT 11  ALKPHOS 122  BILITOT 1.1  PROT 7.7  ALBUMIN 3.4*   No results for input(s): LIPASE, AMYLASE in the  last 168 hours. No results for input(s): AMMONIA in the last 168 hours. Coagulation Profile: Recent Labs  Lab 10/07/17 1727  INR 1.31   Cardiac Enzymes: No results for input(s): CKTOTAL, CKMB, CKMBINDEX, TROPONINI in the last 168 hours. BNP (last 3 results) No results for input(s): PROBNP in the last 8760 hours. HbA1C: No results for input(s): HGBA1C in the last 72 hours. CBG: No results for input(s): GLUCAP in the last 168 hours. Lipid Profile: No results for input(s): CHOL, HDL, LDLCALC, TRIG, CHOLHDL, LDLDIRECT in the last 72 hours. Thyroid Function Tests: No results for input(s): TSH, T4TOTAL, FREET4, T3FREE, THYROIDAB in the last 72 hours. Anemia Panel: No results for input(s): VITAMINB12, FOLATE, FERRITIN, TIBC, IRON, RETICCTPCT in the last 72 hours. Sepsis Labs: Recent Labs  Lab 10/07/17 1752 10/07/17 2222  LATICACIDVEN 1.91* 1.32    Recent Results (from the past 240 hour(s))  Culture, blood (Routine x 2)     Status: None   Collection Time: 10/07/17  5:27 PM  Result Value Ref Range Status   Specimen Description   Final    BLOOD RIGHT ANTECUBITAL Performed at [facility], [address] [place], [place] [address]    Special Requests   Final    BOTTLES DRAWN AEROBIC AND ANAEROBIC Blood Culture adequate volume Performed at [facility], [address] [place], [place] [address]    Culture   Final    NO GROWTH 5 DAYS Performed at [facility], [address] [place], [place] [address]    Report Status 10/12/2017 FINAL  Final  Culture, blood (Routine x 2)     Status: None   Collection Time: 10/07/17  5:32 PM  Result Value Ref Range Status   Specimen Description   Final    BLOOD BLOOD LEFT FOREARM Performed at [facility], [address] [place], [place] [address]    Special Requests   Final    BOTTLES DRAWN AEROBIC AND ANAEROBIC Blood Culture adequate volume Performed at [facility], [address] [place], [place] [address]    Culture   Final    NO GROWTH 5 DAYS Performed at [facility], [address] [place], [place] [address]    Report Status 10/12/2017 FINAL  Final  Urine culture     Status: Abnormal   Collection Time: 10/07/17  7:50 PM  Result Value Ref Range Status   Specimen Description   Final    URINE, RANDOM Performed at [facility], [address] [place], [place] [address]    Special Requests   Final    NONE Performed at [facility], [address] [place], [place] [address]    Culture (A)  Final    >=100,000 COLONIES/mL KLEBSIELLA PNEUMONIAE Confirmed Extended Spectrum Beta-Lactamase Producer (ESBL).  In bloodstream infections from ESBL organisms, carbapenems are preferred over piperacillin/tazobactam. They are shown to have a lower risk of mortality.    Report Status 10/10/2017 FINAL  Final   Organism ID,  Bacteria KLEBSIELLA PNEUMONIAE (A)  Final      Susceptibility   Klebsiella pneumoniae - MIC*    AMPICILLIN >=32 RESISTANT Resistant     CEFAZOLIN >=64 RESISTANT Resistant     CEFTRIAXONE >=64 RESISTANT Resistant     CIPROFLOXACIN >=4 RESISTANT Resistant     GENTAMICIN >=16 RESISTANT Resistant     IMIPENEM 0.5 SENSITIVE Sensitive     NITROFURANTOIN 32 SENSITIVE Sensitive     TRIMETH/SULFA >=320 RESISTANT Resistant     AMPICILLIN/SULBACTAM >=32 RESISTANT Resistant     PIP/TAZO >=128 RESISTANT Resistant     Extended ESBL POSITIVE Resistant     * >=100,000 COLONIES/mL KLEBSIELLA PNEUMONIAE  MRSA PCR Screening     Status: Abnormal   Collection Time: 10/08/17  5:33 AM  Result Value Ref Range Status   MRSA by PCR POSITIVE (A) NEGATIVE Final    Comment:        The GeneXpert MRSA Assay (FDA approved for NASAL specimens only), is one component of a comprehensive MRSA colonization surveillance program. It is not intended to diagnose MRSA infection nor to guide or monitor  treatment for MRSA infections. RESULT CALLED TO, READ BACK BY AND VERIFIED WITH: NELSON,A. RN @0721  ON 8.8.19 BY [facility] Performed at [facility], [address] [place], [place] [address]          Radiology Studies: No results found.      Scheduled Meds: . aspirin EC  81 mg Oral Daily  . atorvastatin  10 mg Oral QHS  . carbidopa-levodopa  2 tablet Oral 3 times per day  . citalopram  10 mg Oral Daily  . clonazePAM  0.5 mg Oral TID  . famotidine  20 mg Oral Daily  . fentaNYL  25 mcg Transdermal Q72H  . gabapentin  500 mg Oral BID  . heparin  5,000 Units Subcutaneous Q8H  . metoprolol tartrate  25 mg Per Tube BID  . mupirocin ointment  1 application Nasal BID  . polyethylene glycol  17 g Oral Daily  . primidone  50 mg Oral Q12H  . traMADol  50 mg Oral BID   Continuous Infusions: . sodium chloride 10 mL/hr at 10/11/17 0445  . meropenem (MERREM) IV 1 g (10/12/17 1042)     LOS: 4 days     Jacquelin Hawkingalph Teran Knittle, MD [facility] 10/12/2017, 12:46 PM Pager: [phone]  If 7PM-7AM, please contact night-coverage [email or web address] 10/12/2017, 12:46 PM

## 2017-10-12 NOTE — Evaluation (Signed)
Clinical/Bedside Swallow Evaluation Patient Details  Name: Jack Carter MRN: [id number] Date of Birth: 12/16/1933  Today's Date: 10/12/2017 Time: SLP Start Time (ACUTE ONLY): 1030 SLP Stop Time (ACUTE ONLY): 1100 SLP Time Calculation (min) (ACUTE ONLY): 30 min  Past Medical History:  Past Medical History:  Diagnosis Date  . Cellulitis   . Coronary artery disease    CABG  . Essential hypertension   . Heart attack (HCC)   . Hyperlipidemia   . Hypertension   . Parkinson's disease (HCC)   . Restless leg syndrome    Past Surgical History:  Past Surgical History:  Procedure Laterality Date  . APPENDECTOMY    . CARDIAC SURGERY  2000  . CARPAL TUNNEL RELEASE Left   . CORONARY ARTERY BYPASS GRAFT    . IR GASTROSTOMY TUBE MOD SED  12/10/2016  . IR GASTROSTOMY TUBE REMOVAL  04/20/2017  . STOMACH SURGERY  2003   intestinal fissure   HPI:  [age] year old male admitted 10/07/17 with UTI and fever. PMH: Parkinson's disease, septic and cardiogenic shock, CABG, CHF, HTN, HLD, MI, long standing history of dysphagia with reported silent aspiration, delayed swallow reflex, and post-swallow residue. Multiple MBS.   Assessment / Plan / Recommendation Clinical Impression  Pt seen at bedside for evaluation of swallow function and safety. Pt is well known to SLP services, having undergone several MBS over the past few years. Most recent MBS (January 2019) revealed delayed swallow reflex, penetration of thin liquids, and post-swallow residue. Pt also has a history of silent aspiration.   Today, pt presents with baseline dysarthric and poorly intelligible speech. Voice quality is gravelly. Pt is unable to utilize strategies to improve intelligibility (slow rate, overarticulate), despite encouragement and education. RN was present to provide meds whole in puree, which pt appeared to tolerate without oral difficulty or overt s/s aspiration. Pt exhibited extended oral prep and oral residue following trials  of solid, due to poor and missing dentition. Graham cracker softened with applesauce appeared to be tolerated better, with more efficient oral prep and less oral residue after the swallow. Delayed cough response was noted after nectar thick trials, raising concern for airway compromise.   At this time, recommend Dys 2 (finely chopped) diet with nectar thick liquids. Whole meds with puree, 1:1 supervision during meals.   Given history and pt report of not wanting to repeat MBS (per January 2019 MBS report), recommend Palliative Care consult to facilitate establishment of appropriate goals of care. ST will follow for education to minimize aspiration risk. RN and MD informed of results and recommendations. Safe swallow precautions [facility] at [facility]   SLP Visit Diagnosis: Dysphagia, oropharyngeal phase (R13.12)(per January 2019 MBS)    Aspiration Risk  Moderate aspiration risk    Diet Recommendation Dysphagia 2 (Fine chop);Nectar-thick liquid   Liquid Administration via: Cup;No straw Medication Administration: Whole meds with puree(1 at a time) Supervision: Full supervision/cueing for compensatory strategies Compensations: Minimize environmental distractions;Small sips/bites;Slow rate Postural Changes: Seated upright at 90 degrees;Remain upright for at least 30 minutes after po intake    Other  Recommendations Oral Care Recommendations: Oral care QID Other Recommendations: Order thickener from pharmacy   Follow up Recommendations 24 hour supervision/assistance      Frequency and Duration min 1 x/week  1 week;2 weeks       Prognosis Prognosis for Safe Diet Advancement: Guarded Barriers to Reach Goals: Cognitive deficits;Time post onset      Swallow Study   General Date of Onset:  10/07/17 HPI: 84 year old male admitted 10/07/17 with UTI and fever. PMH: Parkinson's disease, septic and cardiogenic shock, CABG, CHF, HTN, HLD, MI, long standing history of dysphagia with silent aspiration,  delayed swallow reflex, and post-swallow residue. Multiple MBS. Type of Study: Bedside Swallow Evaluation Previous Swallow Assessment: Multiple SLP evaluation, including MBS most recently completed in January 2019. History of silent aspiration, delayed swallow reflex, and post-swallow residue. Diet Prior to this Study: Regular;Thin liquids Temperature Spikes Noted: No(not since admit) Respiratory Status: Nasal cannula History of Recent Intubation: No Behavior/Cognition: Alert;Cooperative Oral Cavity Assessment: Within Functional Limits Oral Care Completed by SLP: No Oral Cavity - Dentition: Poor condition;Missing dentition Vision: Functional for self-feeding Self-Feeding Abilities: Needs assist Patient Positioning: Upright in bed Baseline Vocal Quality: (gravelly) Volitional Cough: Strong Volitional Swallow: Able to elicit    Oral/Motor/Sensory Function Overall Oral Motor/Sensory Function: Moderate impairment(history of dysarthria, generalized weakness)   Ice Chips Ice chips: Not tested   Thin Liquid Thin Liquid: Not tested    Nectar Thick Nectar Thick Liquid: Impaired Oral Phase Impairments: Reduced labial seal;Reduced lingual movement/coordination;Poor awareness of bolus Pharyngeal Phase Impairments: Suspected delayed Swallow;Cough - Delayed   Honey Thick Honey Thick Liquid: Not tested   Puree Puree: Within functional limits Presentation: Spoon   Solid     Solid: Impaired Oral Phase Impairments: Reduced lingual movement/coordination;Impaired mastication Oral Phase Functional Implications: Prolonged oral transit;Impaired mastication;Oral residue Pharyngeal Phase Impairments: Suspected delayed Swallow     Jack Carter, [facility] Hospital[facility]P, CCC-SLP Speech Language Pathologist [phone]  Jack Carter, Jack Brown 10/12/2017,11:13 AM

## 2017-10-12 NOTE — Consult Note (Signed)
SLP Note  Patient Details Name: Midge MiniumCharles L Bondarenko MRN: [id number] DOB: 12/20/1933  CNA consulted with SLP regarding pt difficulty with solids, and tendency to pocket. Will downgrade diet to puree/nectar thick liquids.  Celia B. Murvin NatalBueche, [facility] SystemMSP, CCC-SLP Speech Language Pathologist [phone]  Leigh AuroraBueche, Celia Brown 10/12/2017, 12:23 PM

## 2017-10-12 NOTE — Progress Notes (Signed)
Physical Therapy Treatment Patient Details Name: Jack MiniumCharles L Bonsignore MRN: [id number] DOB: 03/14/1933 Today's Date: 10/12/2017    History of Present Illness 84 yo  male with onset of weakness and concern for confusion but pt at baseline with MS. Has new fever and is R hemi with speech changes that were pre-exisiting. Has chronic pain but also is wc bound.  PMHx:  cellulitis, HTN, CAD, MI, CABG, sternotomy, CHF, Parkinson's, septic and cardiogenic shock, RLS,     PT Comments    Pt in bed groggy/sleepy trying to dail his cell phone so he could call his daughter but was unable to correctly press buttons. Assisted with dialing # on room phone.  Daughter on line stated "dad has been sleeping a lot" when she comes to visit.  Assisted pt to EOB required Total Assist.  Pt rigid throughout and offered <5%.  Once positioned up right pt required Mod Assist to achieve midline.  Pt sat EOB x 5 min while waiting for Rehab tech for + 2 assist.  Assisted to recliner then positioned with multiple pillows.  RA checked pt was at 97% - notified RN if okay to leave on RA.   Follow Up Recommendations  SNF(per chart review/[facility] is able to provide care)     Equipment Recommendations       Recommendations for Other Services       Precautions / Restrictions Precautions Precautions: Fall Precaution Comments: non amb    Mobility  Bed Mobility Overal bed mobility: Needs Assistance Bed Mobility: Supine to Sit     Supine to sit: Total assist     General bed mobility comments: rigid throughout with HOB elevated and use of bed pad to complete scooting to EOB.  Pt <5%.    Transfers Overall transfer level: Needs assistance Equipment used: 2 person hand held assist Transfers: Stand Pivot Transfers Sit to Stand: +2 physical assistance;From elevated surface;Total assist;+2 safety/equipment         General transfer comment: assisted from elevated bed to recliner 1/4 pivot.  Pt was unable to assist and  remained flex hips/knees.    Ambulation/Gait                 Stairs             Wheelchair Mobility    Modified Rankin (Stroke Patients Only)       Balance                                            Cognition Arousal/Alertness: Awake/alert Behavior During Therapy: Flat affect                                   General Comments: slow to respond and slurreed speech.  follows commands and is aware       Exercises      General Comments        Pertinent Vitals/Pain Pain Assessment: Faces Pain Score: 9  Faces Pain Scale: Hurts little more Pain Location: feet and ankles with activity Pain Descriptors / Indicators: Grimacing;Moaning Pain Intervention(s): Monitored during session;Repositioned    Home Living                      Prior Function  PT Goals (current goals can now be found in the care plan section) Progress towards PT goals: Progressing toward goals    Frequency    Min 3X/week      PT Plan Current plan remains appropriate    Co-evaluation              AM-PAC PT "6 Clicks" Daily Activity  Outcome Measure  Difficulty turning over in bed (including adjusting bedclothes, sheets and blankets)?: Unable Difficulty moving from lying on back to sitting on the side of the bed? : Unable Difficulty sitting down on and standing up from a chair with arms (e.g., wheelchair, bedside commode, etc,.)?: Unable Help needed moving to and from a bed to chair (including a wheelchair)?: Total Help needed walking in hospital room?: Total Help needed climbing 3-5 steps with a railing? : Total 6 Click Score: 6    End of Session Equipment Utilized During Treatment: Gait belt Activity Tolerance: Patient tolerated treatment well Patient left: in chair;with chair alarm set Nurse Communication: Mobility status(RA 97%) PT Visit Diagnosis: Unsteadiness on feet (R26.81);Muscle weakness (generalized)  (M62.81);Adult, failure to thrive (R62.7);Hemiplegia and hemiparesis     Time: [id number] PT Time Calculation (min) (ACUTE ONLY): 25 min  Charges:  $Therapeutic Activity: 23-37 mins                     Felecia ShellingLori Rosslyn Pasion  PTA [facility]  Acute  Rehab Pager      [phone]

## 2017-10-13 DIAGNOSIS — Z515 Encounter for palliative care: Secondary | ICD-10-CM

## 2017-10-13 DIAGNOSIS — Z7189 Other specified counseling: Secondary | ICD-10-CM

## 2017-10-13 NOTE — Care Management Important Message (Addendum)
Important Message  Patient Details IM Letter given to Kathy/Case Manager to present to the Patient Name: Jack Carter MRN: [id number] Date of Birth: 01/15/1934   Medicare Important Message Given:  Yes    Caren MacadamFuller, Kimla Furth 10/13/2017, 12:43 PMImportant Message  Patient Details  Name: Jack Carter MRN: [id number] Date of Birth: 01/10/1934   Medicare Important Message Given:  Yes    Caren MacadamFuller, Deissy Guilbert 10/13/2017, 12:42 PM

## 2017-10-13 NOTE — Progress Notes (Signed)
Patient's family would like [facility].   [facility] does not have a bed today and can accept patient tomorrow.   LCSW notified attending. LCSW will continue to follow.   Beulah GandyBernette Johnathan Tortorelli, LSCW [facility] [phone]

## 2017-10-13 NOTE — Progress Notes (Addendum)
PROGRESS NOTE    Jack Carter  WUJ:[id number] DOB: [birth year] DOA: 10/07/2017 PCP: [name], MD   Brief Narrative: Jack Carter is a 84 y.o. male with medical history significant for Parkinson's disease, septic and cardiogenic shock, CABG, systolic and diastolic CHF. He presented secondary to fever and found to have UTI.   Assessment & Plan:   Principal Problem:   UTI due to Klebsiella species Active Problems:   Parkinson disease (HCC)   Cellulitis of right foot   Essential hypertension   Chronic combined systolic and diastolic CHF (congestive heart failure) (HCC)   UTI Symptoms of fever and dysuria. Started on Vancomycin/Zosyn initially and switched to Ceftriaxone. Urine culture significant for Klebsiella pneumoniae, ESBL. Patient transitioned to meropenem to complete a 7 day course. Today is day #4/7.  Parkinson's disease Baseline. Has contractures/spasticity. Currently on pain regimen, Sinemet and Xeomin q 3 months. -Continue Sinemet -Continue pain regimen  Dysphagia Silent aspiration in passed evaluations. History of PEG. Speech therapy recommending dysphagia 1 diet. Palliative care consulted.  Chronic medications Patient is on Fentanyl patch and Klonopin for unknown reason. According to ALF, he is on this medication for pain control from Parkinson's -Continue fentanyl patch, gabapentin and Tramadol  Essential hypertension Well controlled. -Continue metoprolol  Chronic combined systolic and diastolic heart failure EF of 40-45% with grade 2 diastolic heart failure  CKD stage 3 Stable.  Right foot cellulitis Resolved. Treated with antibiotic regimen as mentioned above. Recommend to keep skin well moisturized.   DVT prophylaxis: Heparin Code Status:   Code Status: Full Code Family Communication: None at bedside Disposition Plan: Discharge to SNF when bed is available. Medically stable for discharge.   Consultants:   Palliative  care  Procedures:   None  Antimicrobials:  Vancomycin (8/7)  Zosyn (8/7)  Ceftriaxone  (8/7>>8/9)  Meropenem (8/10>>   Subjective: No concerns today.  Objective: Vitals:   10/12/17 1600 10/12/17 2059 10/13/17 0523 10/13/17 1015  BP:  (!) 143/63 132/62   Pulse:  (!) 56 (!) 54 (!) 54  Resp:  12 12   Temp:   98 F (36.7 C)   TempSrc:   Oral   SpO2: 96% 100% 100% 100%  Weight:      Height:        Intake/Output Summary (Last 24 hours) at 10/13/2017 1025 Last data filed at 10/13/2017 0600 Gross per 24 hour  Intake 791.01 ml  Output 800 ml  Net -8.99 ml   Filed Weights   10/07/17 1724 10/07/17 2303 10/08/17 0542  Weight: 66.7 kg 72.1 kg 73.3 kg    Examination:  General exam: Appears calm and comfortable Respiratory system: Clear to auscultation. Respiratory effort normal. Cardiovascular system: S1 & S2 heard, RRR. No murmurs, rubs, gallops or clicks. Gastrointestinal system: Abdomen is nondistended, soft and nontender. Normal bowel sounds heard. Central nervous system: Alert. Difficult to understand speech. Extremities: No edema. No calf tenderness. Contractures of feet. Skin: No cyanosis. No rashes  Data Reviewed: I have personally reviewed following labs and imaging studies  CBC: Recent Labs  Lab 10/07/17 1727 10/08/17 0511 10/11/17 1211  WBC 10.7* 9.0 5.5  NEUTROABS 8.3*  --   --   HGB 10.7* 10.0* 9.9*  HCT 33.4* 31.3* 31.1*  MCV 97.1 97.2 97.8  PLT 122* 111* 111*   Basic Metabolic Panel: Recent Labs  Lab 10/07/17 1727 10/08/17 0511 10/11/17 1211  NA 142 140 139  K 4.0 4.0 4.6  CL 101 103 105  CO2  27 28 26   GLUCOSE 141* 102* 142*  BUN 39* 37* 25*  CREATININE 1.91* 1.69* 1.12  CALCIUM 9.0 8.7* 8.6*   GFR: Estimated Creatinine Clearance: 50.9 mL/min (by C-G formula based on SCr of 1.12 mg/dL). Liver Function Tests: Recent Labs  Lab 10/07/17 1727  AST 26  ALT 11  ALKPHOS 122  BILITOT 1.1  PROT 7.7  ALBUMIN 3.4*   No results for  input(s): LIPASE, AMYLASE in the last 168 hours. No results for input(s): AMMONIA in the last 168 hours. Coagulation Profile: Recent Labs  Lab 10/07/17 1727  INR 1.31   Cardiac Enzymes: No results for input(s): CKTOTAL, CKMB, CKMBINDEX, TROPONINI in the last 168 hours. BNP (last 3 results) No results for input(s): PROBNP in the last 8760 hours. HbA1C: No results for input(s): HGBA1C in the last 72 hours. CBG: No results for input(s): GLUCAP in the last 168 hours. Lipid Profile: No results for input(s): CHOL, HDL, LDLCALC, TRIG, CHOLHDL, LDLDIRECT in the last 72 hours. Thyroid Function Tests: No results for input(s): TSH, T4TOTAL, FREET4, T3FREE, THYROIDAB in the last 72 hours. Anemia Panel: No results for input(s): VITAMINB12, FOLATE, FERRITIN, TIBC, IRON, RETICCTPCT in the last 72 hours. Sepsis Labs: Recent Labs  Lab 10/07/17 1752 10/07/17 2222  LATICACIDVEN 1.91* 1.32    Recent Results (from the past 240 hour(s))  Culture, blood (Routine x 2)     Status: None   Collection Time: 10/07/17  5:27 PM  Result Value Ref Range Status   Specimen Description   Final    BLOOD RIGHT ANTECUBITAL Performed at [facility], [address] [place], [place] [address]    Special Requests   Final    BOTTLES DRAWN AEROBIC AND ANAEROBIC Blood Culture adequate volume Performed at [facility], [address] [place], [place] [address]    Culture   Final    NO GROWTH 5 DAYS Performed at [facility], [address] [place], [place] [address]    Report Status 10/12/2017 FINAL  Final  Culture, blood (Routine x 2)     Status: None   Collection Time: 10/07/17  5:32 PM  Result Value Ref Range Status   Specimen Description   Final    BLOOD BLOOD LEFT FOREARM Performed at [facility], [address] [place], [place] [address]    Special Requests   Final    BOTTLES DRAWN AEROBIC AND ANAEROBIC Blood Culture adequate  volume Performed at [facility], [address] [place], [place] [address]    Culture   Final    NO GROWTH 5 DAYS Performed at [facility], [address] [place], [place] [address]    Report Status 10/12/2017 FINAL  Final  Urine culture     Status: Abnormal   Collection Time: 10/07/17  7:50 PM  Result Value Ref Range Status   Specimen Description   Final    URINE, RANDOM Performed at [facility], [address] [place], [place] [address]    Special Requests   Final    NONE Performed at [facility], [address] [place], [place] [address]    Culture (A)  Final    >=100,000 COLONIES/mL KLEBSIELLA PNEUMONIAE Confirmed Extended Spectrum Beta-Lactamase Producer (ESBL).  In bloodstream infections from ESBL organisms, carbapenems are preferred over piperacillin/tazobactam. They are shown to have a lower risk of mortality.    Report Status 10/10/2017 FINAL  Final  Organism ID, Bacteria KLEBSIELLA PNEUMONIAE (A)  Final      Susceptibility   Klebsiella pneumoniae - MIC*    AMPICILLIN >=32 RESISTANT Resistant     CEFAZOLIN >=64 RESISTANT Resistant     CEFTRIAXONE >=64 RESISTANT Resistant     CIPROFLOXACIN >=4 RESISTANT Resistant     GENTAMICIN >=16 RESISTANT Resistant     IMIPENEM 0.5 SENSITIVE Sensitive     NITROFURANTOIN 32 SENSITIVE Sensitive     TRIMETH/SULFA >=320 RESISTANT Resistant     AMPICILLIN/SULBACTAM >=32 RESISTANT Resistant     PIP/TAZO >=128 RESISTANT Resistant     Extended ESBL POSITIVE Resistant     * >=100,000 COLONIES/mL KLEBSIELLA PNEUMONIAE  MRSA PCR Screening     Status: Abnormal   Collection Time: 10/08/17  5:33 AM  Result Value Ref Range Status   MRSA by PCR POSITIVE (A) NEGATIVE Final    Comment:        The GeneXpert MRSA Assay (FDA approved for NASAL specimens only), is one component of a comprehensive MRSA colonization surveillance program. It is not intended to diagnose  MRSA infection nor to guide or monitor treatment for MRSA infections. RESULT CALLED TO, READ BACK BY AND VERIFIED WITH: NELSON,A. RN @0721  ON 8.8.19 BY [facility] Performed at [facility], [address] [place], [place] [address]          Radiology Studies: No results found.      Scheduled Meds: . aspirin EC  81 mg Oral Daily  . atorvastatin  10 mg Oral QHS  . carbidopa-levodopa  2 tablet Oral 3 times per day  . citalopram  10 mg Oral Daily  . clonazePAM  0.5 mg Oral TID  . famotidine  20 mg Oral Daily  . fentaNYL  25 mcg Transdermal Q72H  . gabapentin  500 mg Oral BID  . heparin  5,000 Units Subcutaneous Q8H  . metoprolol tartrate  25 mg Per Tube BID  . polyethylene glycol  17 g Oral Daily  . primidone  50 mg Oral Q12H  . traMADol  50 mg Oral BID   Continuous Infusions: . sodium chloride 10 mL/hr at 10/13/17 0600  . meropenem (MERREM) IV 1 g (10/13/17 1018)     LOS: 5 days     Jacquelin Hawkingalph Kijana Cromie, MD [facility] 10/13/2017, 10:25 AM Pager: [phone]  If 7PM-7AM, please contact night-coverage [email or web address] 10/13/2017, 10:25 AM

## 2017-10-13 NOTE — Care Management Note (Signed)
Case Management Note  Patient Details  Name: Jack Carter MRN: [id number] Date of Birth: 12/17/1933  Subjective/Objective:  Medically stable for d/c,need 3 more days of iv abx for total of 7 days. D/c plan SNF-CSW following.                  Action/Plan:d/c SNF.   Expected Discharge Date:  (unknown)               Expected Discharge Plan:  Skilled Nursing Facility  In-House Referral:  Clinical Social Work  Discharge planning Services     Post Acute Care Choice:    Choice offered to:     DME Arranged:    DME Agency:     HH Arranged:    HH Agency:     Status of Service:  In process, will continue to follow  If discussed at Long Length of Stay Meetings, dates discussed:    Additional Comments:  [name], Vy Badley, RN 10/13/2017, 10:28 AM

## 2017-10-13 NOTE — Consult Note (Signed)
Consultation Note Date: 10/13/2017   Patient Name: Jack Carter  DOB: [birth year]  MRN: [id number]  Age / Sex: 84 y.o., male  PCP: Merlene Laughter, MD Referring Physician: Mariel Aloe, MD  Reason for Consultation: Establishing goals of care  HPI/Patient Profile: 84 y.o. male  with past medical history of CAD post CABG, hypertension, hyperlipidemia, Parkinson's disease, prior trach and PEG (since removed) admitted on 10/07/2017 with generalized pain, fever, and weakness.   Clinical Assessment and Goals of Care: I met today with Jack Carter.   He is difficult to understand and it appears conversation that he has some understanding of what is going on but I am not sure he actually has insight into his medical conditions.  I asked him regarding calling his daughter, page, and he confirmed that she is the one who helps make his medical decisions.  I called it was able to reach his daughter, page.  We discussed clinical course as well as wishes moving forward in regard to advanced directives.  Concepts specific to code status and rehospitalization discussed.  We discussed difference between a aggressive medical intervention path and a palliative, comfort focused care path.  Values and goals of care important to patient and family were attempted to be elicited.  Concept of Hospice and Palliative Care were discussed  We discussed that in light of multiple chronic medical problems that have worsened with this acute problem, care should be focused on interventions that are likely to allow the patient to achieve goal of getting out of the hospital and spending time with family. I discussed with his daughter regarding heroic interventions at the end-of-life and she agree this would not be in line with prior expressed wishes for a natural death or be likely to lead to getting well enough to go back home.  She  reports that this is something that they discussed following him having trach and PEG placed last year.  His daughter is in agreement with changing CODE STATUS to DO NOT RESUSCITATE.  Questions and concerns addressed.   PMT will continue to support holistically.  SUMMARY OF RECOMMENDATIONS   -DNR/DNI -Continue with current medical interventions.  His daughter understands that aspiration is likely to recur and is considering transition back to assisted living facility with the assistance of hospice support versus continuation of current therapy with understanding he will have continued events of aspiration and return hospitalizations.  Decided to reassess him tomorrow as it appears his mental status is a low bit better today and she is hopeful that his diet may be able to be advanced to something that will be more pleasurable for him. -Follow-up tomorrow  Code Status/Advance Care Planning: DNR   Palliative Prophylaxis:   Frequent Pain Assessment  Psycho-social/Spiritual:   Desire for further Chaplaincy support:no  Additional Recommendations: Education on Hospice  Prognosis:  Unable to determine due to his acute illness.  If plans were to shift to plan for comfort, his prognosis is likely less than 6 months if his disease follows  natural course and he should be for hospice if so desired Discharge Planning: To Be Determined      Primary Diagnoses: Present on Admission: . UTI due to Klebsiella species . Parkinson disease ([place])   I have reviewed the medical record, interviewed the patient and family, and examined the patient. The following aspects are pertinent.  Past Medical History:  Diagnosis Date  . Cellulitis   . Coronary artery disease    CABG  . Essential hypertension   . Heart attack ([place])   . Hyperlipidemia   . Hypertension   . Parkinson's disease ([place])   . Restless leg syndrome    Social History   Socioeconomic History  . Marital status: Married    Spouse  name: Not on file  . Number of children: Not on file  . Years of education: Not on file  . Highest education level: Not on file  Occupational History  . Not on file  Social Needs  . Financial resource strain: Not on file  . Food insecurity:    Worry: Not on file    Inability: Not on file  . Transportation needs:    Medical: Not on file    Non-medical: Not on file  Tobacco Use  . Smoking status: Former Research scientist (life sciences)  . Smokeless tobacco: Never Used  Substance and Sexual Activity  . Alcohol use: No  . Drug use: No  . Sexual activity: Never  Lifestyle  . Physical activity:    Days per week: Not on file    Minutes per session: Not on file  . Stress: Not on file  Relationships  . Social connections:    Talks on phone: Not on file    Gets together: Not on file    Attends religious service: Not on file    Active member of club or organization: Not on file    Attends meetings of clubs or organizations: Not on file    Relationship status: Not on file  Other Topics Concern  . Not on file  Social History Narrative   ** Merged History Encounter **       Family History  Problem Relation Age of Onset  . Heart disease Mother   . Cancer Mother   . Pneumonia Father    Scheduled Meds: . aspirin EC  81 mg Oral Daily  . atorvastatin  10 mg Oral QHS  . carbidopa-levodopa  2 tablet Oral 3 times per day  . citalopram  10 mg Oral Daily  . clonazePAM  0.5 mg Oral TID  . famotidine  20 mg Oral Daily  . fentaNYL  25 mcg Transdermal Q72H  . gabapentin  500 mg Oral BID  . heparin  5,000 Units Subcutaneous Q8H  . metoprolol tartrate  25 mg Per Tube BID  . polyethylene glycol  17 g Oral Daily  . primidone  50 mg Oral Q12H  . traMADol  50 mg Oral BID   Continuous Infusions: . sodium chloride 10 mL/hr at 10/13/17 1536  . meropenem (MERREM) IV Stopped (10/13/17 1048)   PRN Meds:.sodium chloride, acetaminophen **OR** acetaminophen, ondansetron **OR** ondansetron (ZOFRAN) IV, polyethylene  glycol, RESOURCE THICKENUP CLEAR, senna-docusate Medications Prior to Admission:  Prior to Admission medications   Medication Sig Start Date End Date Taking? Authorizing Provider  acetaminophen (TYLENOL) 500 MG tablet Take 1,000 mg by mouth 3 (three) times daily.    Yes [provider]  aspirin EC 81 MG tablet Take 81 mg by mouth daily.   Yes  [provider]  atorvastatin (LIPITOR) 10 MG tablet Take 1 tablet (10 mg total) by mouth at bedtime. 11/18/16  Yes Desai, Rahul P, PA-C  carbidopa-levodopa (SINEMET IR) 25-100 MG tablet Take 2 tablets by mouth 3 (three) times daily. At 8am, 12, 5pm. Patient taking differently: Take 2 tablets by mouth 3 (three) times daily. At 10am, 2pm, 8pm. 07/16/17  Yes Marcial Pacas, MD  citalopram (CELEXA) 10 MG tablet Take 1 tablet (10 mg total) by mouth daily. 02/24/17  Yes Rosita Fire, MD  clonazePAM (KLONOPIN) 0.5 MG tablet Take 1 tablet (0.5 mg total) by mouth 3 (three) times daily. 02/23/17  Yes Rosita Fire, MD  Cranberry 450 MG CAPS Take 1 capsule by mouth daily.   Yes [provider]  Emollient (CETAPHIL) cream Apply 1 application topically at bedtime.   Yes [provider]  famotidine (PEPCID) 20 MG tablet Take 20 mg by mouth daily.   Yes [provider]  fentaNYL (DURAGESIC - DOSED MCG/HR) 25 MCG/HR patch Place 1 patch (25 mcg total) onto the skin every 3 (three) days. 02/23/17  Yes Rosita Fire, MD  furosemide (LASIX) 20 MG tablet Place 3 tablets (60 mg total) into feeding tube 2 (two) times daily. 11/18/16  Yes Desai, Rahul P, PA-C  gabapentin (NEURONTIN) 100 MG capsule Take 500 mg by mouth 3 (three) times daily.   Yes [provider]  metoprolol tartrate (LOPRESSOR) 25 MG tablet Place 1 tablet (25 mg total) into feeding tube 2 (two) times daily. 11/18/16  Yes Desai, Rahul P, PA-C  polyethylene glycol (MIRALAX / GLYCOLAX) packet Take 17 g by mouth daily. 02/24/17  Yes Rosita Fire, MD  primidone (MYSOLINE) 50 MG tablet Take 1 tablet (50 mg total) by mouth every 12 (twelve) hours. 11/18/16  Yes Desai, Rahul P, PA-C  traMADol (ULTRAM) 50 MG tablet Take 1 tablet (50 mg total) by mouth 2 (two) times daily. 1 tablet at 9am and 1 tablet at 5pm daily 02/23/17  Yes Rosita Fire, MD  gabapentin (NEURONTIN) 600 MG tablet Take 1 tablet (600 mg total) by mouth 2 (two) times daily. Patient not taking: Reported on 10/07/2017 02/15/17   Patrecia Pour, NP   No Known Allergies Review of Systems Denies complaints  Physical Exam General exam: Appears calm and comfortable.  No distress. Respiratory system: Clear to auscultation. Respiratory effort normal. Cardiovascular system: S1 & S2 heard, RRR. No murmurs, rubs, gallops or clicks. Gastrointestinal system: Abdomen is nondistended, soft and nontender. Normal bowel sounds heard. Central nervous system: Alert. Difficult to understand speech with wet, gravelly voice. Extremities: No edema. No calf tenderness. Contractures of feet. Skin: No cyanosis. No rashes  Vital Signs: BP (!) 144/57 (BP Location: Right Arm)   Pulse (!) 55   Temp 100 F (37.8 C) (Oral)   Resp 18   Ht 5' 9"  (1.753 m)   Wt 73.3 kg   SpO2 97%   BMI 23.86 kg/m  Pain Scale: Faces POSS *See Group Information*: 2-Acceptable,Slightly drowsy, easily aroused Pain Score: Asleep   SpO2: SpO2: 97 % O2 Device:SpO2: 97 % O2 Flow Rate: .O2 Flow Rate (L/min): 2 L/min  IO: Intake/output summary:   Intake/Output Summary (Last 24 hours) at 10/13/2017 1716 Last data filed at 10/13/2017 1536 Gross per 24 hour  Intake 1092 ml  Output 800 ml  Net 292 ml    LBM: Last BM Date: 10/09/17 Baseline Weight: Weight: 66.7 kg Most recent weight: Weight: 73.3 kg  Palliative Assessment/Data:   Flowsheet Rows     Most Recent Value  Intake Tab  Referral Department  Hospitalist  Unit at Time of Referral  Med/Surg Unit  Palliative Care Primary Diagnosis   Neurology  Date Notified  10/12/17  Palliative Care Type  New Palliative care  Reason for referral  Clarify Goals of Care  Date of Admission  10/07/17  Date first seen by Palliative Care  10/13/17  # of days Palliative referral response time  1 Day(s)  # of days IP prior to Palliative referral  5  Clinical Assessment  Palliative Performance Scale Score  40%  Psychosocial & Spiritual Assessment  Palliative Care Outcomes  Patient/Family meeting held?  Yes  Who was at the meeting?  Daughter via phone  Palliative Care Outcomes  Clarified goals of care      Time In: 0910 Time Out: 1030 Time Total: 80 Greater than 50%  of this time was spent counseling and coordinating care related to the above assessment and plan.  Signed by: Micheline Rough, MD   Please contact Palliative Medicine Team phone at [phone] for questions and concerns.  For individual provider: See Shea Evans

## 2017-10-14 DIAGNOSIS — I10 Essential (primary) hypertension: Secondary | ICD-10-CM

## 2017-10-14 DIAGNOSIS — N390 Urinary tract infection, site not specified: Secondary | ICD-10-CM

## 2017-10-14 DIAGNOSIS — N179 Acute kidney failure, unspecified: Secondary | ICD-10-CM

## 2017-10-14 DIAGNOSIS — G20 Parkinson's disease: Secondary | ICD-10-CM

## 2017-10-14 DIAGNOSIS — N182 Chronic kidney disease, stage 2 (mild): Secondary | ICD-10-CM

## 2017-10-14 DIAGNOSIS — I5042 Chronic combined systolic (congestive) and diastolic (congestive) heart failure: Secondary | ICD-10-CM

## 2017-10-14 DIAGNOSIS — R1310 Dysphagia, unspecified: Secondary | ICD-10-CM

## 2017-10-14 DIAGNOSIS — B961 Klebsiella pneumoniae [K. pneumoniae] as the cause of diseases classified elsewhere: Secondary | ICD-10-CM

## 2017-10-14 DIAGNOSIS — L03115 Cellulitis of right lower limb: Secondary | ICD-10-CM

## 2017-10-14 MED ORDER — CLONAZEPAM 0.5 MG PO TABS: 0.5000 mg | ORAL_TABLET | Freq: Three times a day (TID) | ORAL | 0 refills | Status: AC

## 2017-10-14 MED ORDER — TRAMADOL HCL 50 MG PO TABS: 50.0000 mg | ORAL_TABLET | Freq: Two times a day (BID) | ORAL | 0 refills | Status: AC

## 2017-10-14 MED ORDER — BISACODYL 5 MG PO TBEC
10.0000 mg | DELAYED_RELEASE_TABLET | Freq: Once | ORAL | Status: AC
  Administered 2017-10-14: 10 mg via ORAL
  Filled 2017-10-14: qty 2

## 2017-10-14 MED ORDER — FENTANYL 25 MCG/HR TD PT72: 25.0000 ug | MEDICATED_PATCH | TRANSDERMAL | 0 refills | Status: AC

## 2017-10-14 MED ORDER — FUROSEMIDE 20 MG PO TABS: 60.0000 mg | ORAL_TABLET | Freq: Two times a day (BID) | ORAL | Status: AC

## 2017-10-14 MED ORDER — SODIUM CHLORIDE 0.9 % IV SOLN: 1.0000 g | Freq: Two times a day (BID) | INTRAVENOUS | 0 refills | Status: AC

## 2017-10-14 NOTE — Progress Notes (Signed)
Palliative care progress note  Reason for consult: goals of care in light or dysphagia and recurrent aspiration  I met today with Jack Carter.  He was not in a good mood and did not want to discuss with me today.  Reports staff is "a bunch of lying SOBs."  I called and discussed with his daughter, Jack Carter.  She reports that she feels he is intermittently confused.  He told her last night that he does not remember talking with me at all and does not remember telling me that he would never want another PEG, trach, or life support.  She believes this was the basis for his comment about people lying to him.  She reports that they discussed last night and she feels that his wish, if he were to truly understand the situation, would be to not have aggressive interventions in the event of cardiac or respiratory arrest.  We discussed again about care plan moving forward, including discussion of palliative care vs. hospice care at long term care facility.   After much review, Jack Carter is in agreement with plan for rehab/completion of antibiotics at [facility] with outpatient palliative care to follow.  If he improves, likely plan after rehab will be back to ALF, possible with extra support of hospice.  If he does poorly, she is interested in transition to residential hospice if it appears he will not recover.  We talked about a lot of prognosis being tied to aspiration and this is a matter of when, not if, it will recur.  He is very upset with current pureed diet.  I have asked SLP to reassess him today prior to discharge.  Will continue to consider further liberalization understanding risk for aspiration.  His daughter thinks that this would be his wish, however, she reports that he did not seem to understand this during conversation with him last evening.  - To SNF with strong recommendation for palliative to follow.  Please include this in the discharge summary if you agree this is appropriate. - Discussed care  plan with RN, CM, SW, SLP, and Dr. Cathlean Sauer.  Total time: 60 minutes Greater than 50%  of this time was spent counseling and coordinating care related to the above assessment and plan.  Micheline Rough, MD [place] Team [phone]

## 2017-10-14 NOTE — Progress Notes (Signed)
Report called to [facility], nurse Nia. All questions answered. Facility aware of last BM. [facility] also called. Daughter, Idalia Needleaige, called to be made aware.

## 2017-10-14 NOTE — Progress Notes (Signed)
[facility] arrived and transported pt off of floor in stable condition. Daughter, Idalia Needleaige called and made aware per request.

## 2017-10-14 NOTE — Progress Notes (Signed)
Per documentation, pt has not had a BM since 10/09/17. Pt not oriented enough to remember. Abdomen not distended, and nontender with palpation. MD Arrien made aware. Ordered Dulcolax PO given. Per MD, pt is OK to continue with D/C even if he does not have a BM here. Miralax given this AM also, and ordered daily after D/C. Will let facility know when giving report.

## 2017-10-14 NOTE — Discharge Summary (Signed)
Physician Discharge Summary  Jack Carter QIW:[id number] DOB: 06/20/1933 DOA: 10/07/2017  PCP: Angela Coxasanayaka, Gayani Y, MD  Admit date: 10/07/2017 Discharge date: 10/14/2017  Admitted From: Assisted living facility Disposition:  SNF   Recommendations for Outpatient Follow-up and new medication changes:  1. Follow up with Dr. Kerry Doryasanayaka in 7 days.  2. Continue IV meropenem, last dose on 10/16/2017 3. After completing antibiotic therapy patient will benefit from palliative care follow up services due to multiple chronic medical problems that have worsened and carry a very poor prognosis.     Home Health: na  Equipment/Devices: na    Discharge Condition: stable  CODE STATUS: DNR   Diet recommendation: Dysphagia 1 with nectar thick liquids.   Liquid Administration via: Cup;No straw Medication Administration: Whole meds with puree(1 at a time) Supervision: Full supervision/cueing for compensatory strategies Compensations: Minimize environmental distractions;Small sips/bites;Slow rate Postural Changes: Seated upright at 90 degrees;Remain upright for at least 30 minutes after po intake   Brief/Interim Summary: 84 year old male who presented with fever. He does have a significant past medical history for Parkinson's disease, coronary artery disease status post CABG, and systolic heart failure. Apparently patient was found in his room in generalized pain, his temperature was 102.9. He did complain of dysuria but no abdominal pain. Patient does have a very poor baseline functional status, difficulty speaking, cognitive dysfunction and wheelchair-bound. On his initial physical examination blood pressure 145/61, heart rate 59, respiratory rate 12, oxygen saturation 100%. Dry mucous membranes, his lungs were clear to auscultation bilaterally, heart S1-S2 present and rhythmic, the abdomen was soft nontender, he had lower extremities contractures, generalize weakness, and no lower extremity edema. Positive  right foot erythema. Sodium 142, potassium 4.0 chloride 11, bicarbonate 27, glucose 141, BUN 39, creatinine 1.91, white count 10.7, hemoglobin 10.7, hematocrit 33.4, platelets 122. Urinalysis specific gravity 1.016, 21-50 RBCs, greater than 50 white cells. Chest x-ray with hypoinflation, positive cardiomegaly, no infiltrates.  Patient was admitted to the hospital with the working diagnosis of sepsis due to urinary tract infection, complicated by acute kidney injury and metabolic encephalopathy.  1. Sepsis due to Klebsiella pneumonia/ESBL urinary tract infection, present on admission. Patient was admitted to the medical ward, he was placed on broad spectrum IV antibiotic therapy with vancomycin and Zosyn, posteriorly transition to IV ceftriaxone. After urine culture resulted positive for ESBL Klebsiella pneumonia he was changed to IV meropenem, plan to continue therapy until August 16. Patient remained afebrile and his discharge white cell count is 5.5. His blood cultures remain no growth.  2. Parkinson's disease complicated by cognitive impairment and dysphagia. Patient was continued on Sinemet and primidone, swallow evaluation recommended dysphagia 1 diet with aspiration precautions. His disease seems to be advanced with poor prognosis and high risk of aspiration.  3. Acute kidney injury on chronic kidney disease stage 2. Patient received IV fluids with improvement of kidney function, discharge creatinine 1.12, potassium 4.6 and serum bicarbonate 26.  4. Chronic pain syndrome related to musculoskeletal contractures. Patient was continued on fentanyl and clonazepam, with good toleration, this regimen will be continued at the skilled nursing facility, close follow-up is required. Continue acetaminophen, citalopram, tramadol and gabapentin.   5. Right foot cellulitis, present on admission, has resolved.  6. Chronic diastolic heart failure. Ejection fraction 40-45%, no signs of volume overload, will  recommend to use furosemide only as needed for signs of volume overload.  7. HTN. Continue metoprolol for blood pressure control.   Discharge Diagnoses:  Principal Problem:   UTI  due to Klebsiella species Active Problems:   Parkinson disease (HCC)   Cellulitis of right foot   Essential hypertension   Chronic combined systolic and diastolic CHF (congestive heart failure) (HCC)    Discharge Instructions   Allergies as of 10/14/2017   No Known Allergies     Medication List    TAKE these medications   acetaminophen 500 MG tablet Commonly known as:  TYLENOL Take 1,000 mg by mouth 3 (three) times daily.   aspirin EC 81 MG tablet Take 81 mg by mouth daily.   atorvastatin 10 MG tablet Commonly known as:  LIPITOR Take 1 tablet (10 mg total) by mouth at bedtime.   carbidopa-levodopa 25-100 MG tablet Commonly known as:  SINEMET IR Take 2 tablets by mouth 3 (three) times daily. At 8am, 12, 5pm. What changed:  additional instructions   cetaphil cream Apply 1 application topically at bedtime.   citalopram 10 MG tablet Commonly known as:  CELEXA Take 1 tablet (10 mg total) by mouth daily.   clonazePAM 0.5 MG tablet Commonly known as:  KLONOPIN Take 1 tablet (0.5 mg total) by mouth 3 (three) times daily for 10 doses.   Cranberry 450 MG Caps Take 1 capsule by mouth daily.   famotidine 20 MG tablet Commonly known as:  PEPCID Take 20 mg by mouth daily.   fentaNYL 25 MCG/HR patch Commonly known as:  DURAGESIC - dosed mcg/hr Place 1 patch (25 mcg total) onto the skin every 3 (three) days for 3 doses.   furosemide 20 MG tablet Commonly known as:  LASIX Place 3 tablets (60 mg total) into feeding tube 2 (two) times daily. Use only as needed for edema or weight gain 3 lbs in 24 hours or 5 lbs in 7 days. What changed:  additional instructions   gabapentin 100 MG capsule Commonly known as:  NEURONTIN Take 500 mg by mouth 3 (three) times daily. What changed:  Another  medication with the same name was removed. Continue taking this medication, and follow the directions you see here.   meropenem 1 g in sodium chloride 0.9 % 100 mL Inject 1 g into the vein every 12 (twelve) hours for 2 days. Last dose on October 16, 2017.   metoprolol tartrate 25 MG tablet Commonly known as:  LOPRESSOR Place 1 tablet (25 mg total) into feeding tube 2 (two) times daily.   polyethylene glycol packet Commonly known as:  MIRALAX / GLYCOLAX Take 17 g by mouth daily.   primidone 50 MG tablet Commonly known as:  MYSOLINE Take 1 tablet (50 mg total) by mouth every 12 (twelve) hours.   traMADol 50 MG tablet Commonly known as:  ULTRAM Take 1 tablet (50 mg total) by mouth 2 (two) times daily for 10 doses. 1 tablet at 9am and 1 tablet at 5pm daily       No Known Allergies  Consultations:  Palliative Care   Procedures/Studies: Dg Chest 2 View  Result Date: 10/07/2017 CLINICAL DATA:  84 year old male with a history of generalized pain EXAM: CHEST - 2 VIEW COMPARISON:  02/18/2017, 11/29/2016 FINDINGS: Cardiomediastinal silhouette unchanged in size and contour. Surgical changes of median sternotomy and CABG. Low lung volumes.  No pleural effusion or pneumothorax. Similar appearance of coarsened interstitial markings with reticular opacity, without new airspace disease. Degenerative changes with no acute displaced fracture. IMPRESSION: Chronic lung changes without evidence of lobar pneumonia. Surgical changes of median sternotomy and CABG Electronically Signed   By: Gilmer Mor D.O.  On: 10/07/2017 18:58       Subjective: Patient is feeling well, limited communication, pain seems to be well controlled, no nausea or vomiting, no chest pain or dyspnea.   Discharge Exam: Vitals:   10/14/17 0451 10/14/17 1044  BP: (!) 116/54 (!) 134/55  Pulse: (!) 59 (!) 59  Resp: 13   Temp: 100.2 F (37.9 C)   SpO2: 98% 95%   Vitals:   10/13/17 1503 10/13/17 2037 10/14/17 0451  10/14/17 1044  BP: (!) 144/57 (!) 117/51 (!) 116/54 (!) 134/55  Pulse: (!) 55 (!) 54 (!) 59 (!) 59  Resp: 18 12 13    Temp: 100 F (37.8 C) (!) 97.1 F (36.2 C) 100.2 F (37.9 C)   TempSrc: Oral     SpO2: 97% 100% 98% 95%  Weight:      Height:        General: Not in pain or dyspnea, deconditioned  Neurology: Awake and alert, non focal/ slow to respond  E ENT: mild pallor, no icterus, oral mucosa moist Cardiovascular: No JVD. S1-S2 present, rhythmic, no gallops, rubs, or murmurs. No lower extremity edema. Pulmonary: positive breath sounds bilaterally, no wheezing, rhonchi or rales. Gastrointestinal. Abdomen with no organomegaly, non tender, no rebound or guarding Skin. No rashes Musculoskeletal: no joint deformities   The results of significant diagnostics from this hospitalization (including imaging, microbiology, ancillary and laboratory) are listed below for reference.     Microbiology: Recent Results (from the past 240 hour(s))  Culture, blood (Routine x 2)     Status: None   Collection Time: 10/07/17  5:27 PM  Result Value Ref Range Status   Specimen Description   Final    BLOOD RIGHT ANTECUBITAL Performed at [facility], [address] [place], [place] [address]    Special Requests   Final    BOTTLES DRAWN AEROBIC AND ANAEROBIC Blood Culture adequate volume Performed at [facility], [address] [place], [place] [address]    Culture   Final    NO GROWTH 5 DAYS Performed at [facility], [address] [place], [place] [address]    Report Status 10/12/2017 FINAL  Final  Culture, blood (Routine x 2)     Status: None   Collection Time: 10/07/17  5:32 PM  Result Value Ref Range Status   Specimen Description   Final    BLOOD BLOOD LEFT FOREARM Performed at [facility], [address] [place], [place] [address]    Special Requests   Final    BOTTLES DRAWN AEROBIC AND ANAEROBIC Blood Culture  adequate volume Performed at [facility], [address] [place], [place] [address]    Culture   Final    NO GROWTH 5 DAYS Performed at [facility], [address] [place], [place] [address]    Report Status 10/12/2017 FINAL  Final  Urine culture     Status: Abnormal   Collection Time: 10/07/17  7:50 PM  Result Value Ref Range Status   Specimen Description   Final    URINE, RANDOM Performed at [facility], [address] [place], [place] [address]    Special Requests   Final    NONE Performed at [facility], [address] [place], [place] [address]    Culture (A)  Final    >=100,000 COLONIES/mL KLEBSIELLA PNEUMONIAE Confirmed Extended Spectrum Beta-Lactamase Producer (ESBL).  In bloodstream infections from ESBL organisms, carbapenems  are preferred over piperacillin/tazobactam. They are shown to have a lower risk of mortality.    Report Status 10/10/2017 FINAL  Final   Organism ID, Bacteria KLEBSIELLA PNEUMONIAE (A)  Final      Susceptibility   Klebsiella pneumoniae - MIC*    AMPICILLIN >=32 RESISTANT Resistant     CEFAZOLIN >=64 RESISTANT Resistant     CEFTRIAXONE >=64 RESISTANT Resistant     CIPROFLOXACIN >=4 RESISTANT Resistant     GENTAMICIN >=16 RESISTANT Resistant     IMIPENEM 0.5 SENSITIVE Sensitive     NITROFURANTOIN 32 SENSITIVE Sensitive     TRIMETH/SULFA >=320 RESISTANT Resistant     AMPICILLIN/SULBACTAM >=32 RESISTANT Resistant     PIP/TAZO >=128 RESISTANT Resistant     Extended ESBL POSITIVE Resistant     * >=100,000 COLONIES/mL KLEBSIELLA PNEUMONIAE  MRSA PCR Screening     Status: Abnormal   Collection Time: 10/08/17  5:33 AM  Result Value Ref Range Status   MRSA by PCR POSITIVE (A) NEGATIVE Final    Comment:        The GeneXpert MRSA Assay (FDA approved for NASAL specimens only), is one component of a comprehensive MRSA colonization surveillance program. It is not intended to  diagnose MRSA infection nor to guide or monitor treatment for MRSA infections. RESULT CALLED TO, READ BACK BY AND VERIFIED WITH: NELSON,A. RN @0721  ON 8.8.19 BY NMCCOY Performed at [facility], [address], [place], [place] [address]      Labs: BNP (last 3 results) Recent Labs    10/31/16 2202 11/06/16 0729 11/08/16 0452  BNP 205.9* 813.0* 657.0*   Basic Metabolic Panel: Recent Labs  Lab 10/07/17 1727 10/08/17 0511 10/11/17 1211  NA 142 140 139  K 4.0 4.0 4.6  CL 101 103 105  CO2 27 28 26   GLUCOSE 141* 102* 142*  BUN 39* 37* 25*  CREATININE 1.91* 1.69* 1.12  CALCIUM 9.0 8.7* 8.6*   Liver Function Tests: Recent Labs  Lab 10/07/17 1727  AST 26  ALT 11  ALKPHOS 122  BILITOT 1.1  PROT 7.7  ALBUMIN 3.4*   No results for input(s): LIPASE, AMYLASE in the last 168 hours. No results for input(s): AMMONIA in the last 168 hours. CBC: Recent Labs  Lab 10/07/17 1727 10/08/17 0511 10/11/17 1211  WBC 10.7* 9.0 5.5  NEUTROABS 8.3*  --   --   HGB 10.7* 10.0* 9.9*  HCT 33.4* 31.3* 31.1*  MCV 97.1 97.2 97.8  PLT 122* 111* 111*   Cardiac Enzymes: No results for input(s): CKTOTAL, CKMB, CKMBINDEX, TROPONINI in the last 168 hours. BNP: Invalid input(s): POCBNP CBG: No results for input(s): GLUCAP in the last 168 hours. D-Dimer No results for input(s): DDIMER in the last 72 hours. Hgb A1c No results for input(s): HGBA1C in the last 72 hours. Lipid Profile No results for input(s): CHOL, HDL, LDLCALC, TRIG, CHOLHDL, LDLDIRECT in the last 72 hours. Thyroid function studies No results for input(s): TSH, T4TOTAL, T3FREE, THYROIDAB in the last 72 hours.  Invalid input(s): FREET3 Anemia work up No results for input(s): VITAMINB12, FOLATE, FERRITIN, TIBC, IRON, RETICCTPCT in the last 72 hours. Urinalysis    Component Value Date/Time   COLORURINE YELLOW 10/07/2017 1950   APPEARANCEUR CLEAR 10/07/2017 1950   LABSPEC 1.016 10/07/2017 1950    PHURINE 6.0 10/07/2017 1950   GLUCOSEU NEGATIVE 10/07/2017 1950   HGBUR LARGE (A) 10/07/2017 1950   BILIRUBINUR NEGATIVE 10/07/2017 1950   KETONESUR NEGATIVE 10/07/2017 1950   PROTEINUR  NEGATIVE 10/07/2017 1950   NITRITE NEGATIVE 10/07/2017 1950   LEUKOCYTESUR LARGE (A) 10/07/2017 1950   Sepsis Labs Invalid input(s): PROCALCITONIN,  WBC,  LACTICIDVEN Microbiology Recent Results (from the past 240 hour(s))  Culture, blood (Routine x 2)     Status: None   Collection Time: 10/07/17  5:27 PM  Result Value Ref Range Status   Specimen Description   Final    BLOOD RIGHT ANTECUBITAL Performed at [facility], [address], [place], [place] [address]    Special Requests   Final    BOTTLES DRAWN AEROBIC AND ANAEROBIC Blood Culture adequate volume Performed at [facility], [address], [place], [place] [address]    Culture   Final    NO GROWTH 5 DAYS Performed at [facility], [address] [place], [place] [address]    Report Status 10/12/2017 FINAL  Final  Culture, blood (Routine x 2)     Status: None   Collection Time: 10/07/17  5:32 PM  Result Value Ref Range Status   Specimen Description   Final    BLOOD BLOOD LEFT FOREARM Performed at [facility], [address] [place], [place] [address]    Special Requests   Final    BOTTLES DRAWN AEROBIC AND ANAEROBIC Blood Culture adequate volume Performed at [facility], [address] [place], [place] [address]    Culture   Final    NO GROWTH 5 DAYS Performed at [facility], [address] [place], [place] [address]    Report Status 10/12/2017 FINAL  Final  Urine culture     Status: Abnormal   Collection Time: 10/07/17  7:50 PM  Result Value Ref Range Status   Specimen Description   Final    URINE, RANDOM Performed at [facility], [address], [place], [place] [address]    Special Requests   Final     NONE Performed at [facility], [address] [place], [place] [address]    Culture (A)  Final    >=100,000 COLONIES/mL KLEBSIELLA PNEUMONIAE Confirmed Extended Spectrum Beta-Lactamase Producer (ESBL).  In bloodstream infections from ESBL organisms, carbapenems are preferred over piperacillin/tazobactam. They are shown to have a lower risk of mortality.    Report Status 10/10/2017 FINAL  Final   Organism ID, Bacteria KLEBSIELLA PNEUMONIAE (A)  Final      Susceptibility   Klebsiella pneumoniae - MIC*    AMPICILLIN >=32 RESISTANT Resistant     CEFAZOLIN >=64 RESISTANT Resistant     CEFTRIAXONE >=64 RESISTANT Resistant     CIPROFLOXACIN >=4 RESISTANT Resistant     GENTAMICIN >=16 RESISTANT Resistant     IMIPENEM 0.5 SENSITIVE Sensitive     NITROFURANTOIN 32 SENSITIVE Sensitive     TRIMETH/SULFA >=320 RESISTANT Resistant     AMPICILLIN/SULBACTAM >=32 RESISTANT Resistant     PIP/TAZO >=128 RESISTANT Resistant     Extended ESBL POSITIVE Resistant     * >=100,000 COLONIES/mL KLEBSIELLA PNEUMONIAE  MRSA PCR Screening     Status: Abnormal   Collection Time: 10/08/17  5:33 AM  Result Value Ref Range Status   MRSA by PCR POSITIVE (A) NEGATIVE Final    Comment:        The GeneXpert MRSA Assay (FDA approved for NASAL specimens only), is one component of a comprehensive MRSA colonization surveillance program. It is not intended to diagnose MRSA infection nor to guide or monitor treatment for  MRSA infections. RESULT CALLED TO, READ BACK BY AND VERIFIED WITH: NELSON,A. RN @0721  ON 8.8.19 BY NMCCOY Performed at [facility], [address] [place], [place] [address]      Time coordinating discharge: 45 minutes  SIGNED:   Coralie Keens, MD  [facility] 10/14/2017, 11:03 AM Pager [phone]  If 7PM-7AM, please contact night-coverage [email or web address] Password [email or web address]

## 2017-10-14 NOTE — Clinical Social Work Placement (Addendum)
   12:13 PM Patient's daughter chose bed at [facility].  Patient will go to facility with Palliative.   LCSW confirmed bed with facility.   LCSW faxed dc docs.  Patient will transport by [facility].  Family is coming to visit. RN Please call [facility] when patient is ready to transport [phone].  RN report #: [id number]- [phone]  CLINICAL SOCIAL WORK PLACEMENT  NOTE  Date:  10/14/2017  Patient Details  Name: Jack Carter MRN: [id number] Date of Birth: 10/27/1933  Clinical Social Work is seeking post-discharge placement for this patient at the Skilled  Nursing Facility level of care (*CSW will initial, date and re-position this form in  chart as items are completed):  Yes   Patient/family provided with [place] Clinical Social Work Department's list of facilities offering this level of care within the geographic area requested by the patient (or if unable, by the patient's family).  Yes   Patient/family informed of their freedom to choose among providers that offer the needed level of care, that participate in Medicare, Medicaid or managed care program needed by the patient, have an available bed and are willing to accept the patient.  Yes   Patient/family informed of [facility]'s ownership interest in [facility] and [facility], as well as of the fact that they are under no obligation to receive care at these facilities.  PASRR submitted to EDS on       PASRR number received on       Existing PASRR number confirmed on 10/09/17     FL2 transmitted to all facilities in geographic area requested by pt/family on 10/09/17     FL2 transmitted to all facilities within larger geographic area on       Patient informed that his/her managed care company has contracts with or will negotiate with certain facilities, including the following:        Yes   Patient/family informed of bed offers received.  [facility] bed at     [facility]  Physician recommends and patient  chooses bed at      Patient to be transferred to   on  .  Patient to be transferred to facility by     EMS  Patient family notified on   10/14/17 of transfer.  Name of family member notified:     Jack Carter   PHYSICIAN       Additional Comment:    _______________________________________________ Coralyn HellingBernette Zafir Schauer, LCSW 10/14/2017, 12:13 PM

## 2017-10-14 NOTE — Progress Notes (Signed)
  Speech Language Pathology Treatment: Dysphagia  Patient Details Name: Jack Carter MRN: [id number] DOB: 09/22/1933 Today's Date: 10/14/2017 Time: 1135-1208 SLP Time Calculation (min) (ACUTE ONLY): 33 min  Assessment / Plan / Recommendation Clinical Impression  With review of prior MBS from January 2019 indicating chin tuck posture helpful with nectar liquids, SLP restarted this strategy.  Pt without indications of aspiration with chin tuck posture with soft sandwich, puree, icecream, and nectar liquids.   He does demonstrated delayed oral manipulation likely due to weakness and initiation deficits from parkinson's disease.  He also requires more time to consume po with advancement in diet.  Since pt is medically stable, recommend advance to dys3/ground meats/nectar with strict precautions incorporating strategies from prior MBS.  Also advise pt be allowed thin water via tsp between meals.    Concern for oral candidiasis present given whitish spots on left lateral tongue which did not clear despite use of toothette and water.  Recommend assess for possible oral candidiasis.    Follow up SLP at SNF recommended to help manage dysphagia.  Risks and benefits of modified diets review by SNF SLP would be helpful for this pt/family to help determine his care plan.  Thanks for allowing me to help care for this pt.    HPI HPI: 84 year old male admitted 10/07/17 with UTI and fever. PMH: Parkinson's disease, septic and cardiogenic shock, CABG, CHF, HTN, HLD, MI, long standing history of dysphagia with silent aspiration, delayed swallow reflex, and post-swallow residue. Multiple MBS studies completed and during last evaluation in xray (January 2019), pt advised he never wanted to have another test done again.  SlP notified by MD Jack Carter that pt wants diet advanced and request for reevaluation placed.       SLP Plan  Continue with current plan of care       Recommendations  Diet recommendations:  Dysphagia 3 (mechanical soft);Nectar-thick liquid;Other(comment)(tsps thin water between meals) Liquids provided via: Straw Medication Administration: Whole meds with puree(1 at a time) Compensations: Minimize environmental distractions;Small sips/bites;Slow rate;Chin tuck;Follow solids with liquid(clean mouth after meals, clear throat if voice is gurgly) Postural Changes and/or Swallow Maneuvers: Seated upright 90 degrees;Upright 30-60 min after meal                Oral Care Recommendations: Oral care QID Follow up Recommendations: 24 hour supervision/assistance SLP Visit Diagnosis: Dysphagia, oropharyngeal phase (R13.12)(per January 2019 MBS) Plan: Continue with current plan of care       GO                Jack Carter 10/14/2017, 12:31 PM Jack Burnetamara Tata [name], MS [facility] SLP [phone]

## 2017-10-19 ENCOUNTER — Non-Acute Institutional Stay: Payer: Non-veteran care | Admitting: Hospice and Palliative Medicine

## 2017-10-20 NOTE — Progress Notes (Signed)
PALLIATIVE CARE CONSULT VISIT   PATIENT NAME: Jack Carter Carrizales DOB: 11/05/1933 MRN: [id number]  PRIMARY CARE PROVIDER:   Angela Coxasanayaka, Gayani Y, MD  REFERRING PROVIDER:  Sande RivesGina, PA-C  RESPONSIBLE PARTY:   Daughter [name]- Paige [phone]- [phone]  ASSESSMENT:     Patient appears to have some intermittent confusion. He also has poor vocalization that makes having meaningful conversations difficult. Nursing says he refused his oral medications today.   I called and spoke with patient's daughter. She feels he is improving daily and is hopeful that he can return to ALF upon completion of rehab. She recognizes that patient is at risk of future decline. She has previously talked to him about his wishes in the event that he could not eat and he would not want a PEG or intubation. Note that he apparently had a prolonged stay of several months in an LTACH on a ventilator and would not want that again. However, she did say that he has told her he would be ok with CPR. We talked about the probable futility of CPR, particularly in the case of a patient that does not want to be intubated. She says she thinks she will probably change him to a DNR.   I introduced the idea of a MOST form to daughter and offered to complete one if she was interested.   Will follow.  RECOMMENDATIONS and PLAN:  1.Continue supportive care 2. Recommend DNR  I spent 45 minutes providing this consultation,  from 1230 to 1315. More than 50% of the time in this consultation was spent coordinating communication.   HISTORY OF PRESENT ILLNESS:  Jack Carter Klemp is a 84 y.o. year old male with multiple medical problems including PD, dysphagia, CAD s/p CABG, CM with EF 40%, and chronic pain, who was hospitalized 10/07/17 to 10/14/17 with sepsis from PNA and UTI. Patient was seen in consultation by palliative care. Family opted to pursue rehab and we are now asked to follow at SNF.   CODE STATUS: FC  PPS: 30% HOSPICE ELIGIBILITY/DIAGNOSIS:  TBD  PAST MEDICAL HISTORY:  Past Medical History:  Diagnosis Date  . Cellulitis   . Coronary artery disease    CABG  . Essential hypertension   . Heart attack (HCC)   . Hyperlipidemia   . Hypertension   . Parkinson's disease (HCC)   . Restless leg syndrome     SOCIAL HX:  Social History   Tobacco Use  . Smoking status: Former Games developermoker  . Smokeless tobacco: Never Used  Substance Use Topics  . Alcohol use: No    ALLERGIES: No Known Allergies   PERTINENT MEDICATIONS:  Outpatient Encounter Medications as of 10/19/2017  Medication Sig  . acetaminophen (TYLENOL) 500 MG tablet Take 1,000 mg by mouth 3 (three) times daily.   Marland Kitchen. aspirin EC 81 MG tablet Take 81 mg by mouth daily.  Marland Kitchen. atorvastatin (LIPITOR) 10 MG tablet Take 1 tablet (10 mg total) by mouth at bedtime.  . carbidopa-levodopa (SINEMET IR) 25-100 MG tablet Take 2 tablets by mouth 3 (three) times daily. At 8am, 12, 5pm. (Patient taking differently: Take 2 tablets by mouth 3 (three) times daily. At 10am, 2pm, 8pm.)  . citalopram (CELEXA) 10 MG tablet Take 1 tablet (10 mg total) by mouth daily.  . Cranberry 450 MG CAPS Take 1 capsule by mouth daily.  . Emollient (CETAPHIL) cream Apply 1 application topically at bedtime.  . famotidine (PEPCID) 20 MG tablet Take 20 mg by mouth daily.  .Marland Kitchen  fentaNYL (DURAGESIC - DOSED MCG/HR) 25 MCG/HR patch Place 1 patch (25 mcg total) onto the skin every 3 (three) days for 3 doses.  . furosemide (LASIX) 20 MG tablet Place 3 tablets (60 mg total) into feeding tube 2 (two) times daily. Use only as needed for edema or weight gain 3 lbs in 24 hours or 5 lbs in 7 days.  Marland Kitchen. gabapentin (NEURONTIN) 100 MG capsule Take 500 mg by mouth 3 (three) times daily.  . metoprolol tartrate (LOPRESSOR) 25 MG tablet Place 1 tablet (25 mg total) into feeding tube 2 (two) times daily.  . polyethylene glycol (MIRALAX / GLYCOLAX) packet Take 17 g by mouth daily.  . primidone (MYSOLINE) 50 MG tablet Take 1 tablet (50 mg total)  by mouth every 12 (twelve) hours.  . [EXPIRED] traMADol (ULTRAM) 50 MG tablet Take 1 tablet (50 mg total) by mouth 2 (two) times daily for 10 doses. 1 tablet at 9am and 1 tablet at 5pm daily   Facility-Administered Encounter Medications as of 10/19/2017  Medication  . incobotulinumtoxinA (XEOMIN) 100 units injection 500 Units    PHYSICAL EXAM:   General: NAD, frail appearing, thin, lying in bed Cardiovascular: regular rate and rhythm Pulmonary: clear ant fields, unlabored Abdomen: soft, nontender, + bowel sounds Extremities: no edema, contractures of feet Skin: no rashes Neurological: Weakness, poor vocalization, oriented to person and place  Malachy MoanJOSHUA R Kayci [place], NP

## 2017-10-26 ENCOUNTER — Non-Acute Institutional Stay: Payer: Medicare Other | Admitting: Hospice and Palliative Medicine

## 2017-10-26 DIAGNOSIS — Z515 Encounter for palliative care: Secondary | ICD-10-CM

## 2017-10-27 NOTE — Progress Notes (Signed)
PALLIATIVE CARE CONSULT VISIT   PATIENT NAME: Jack Carter DOB: [birth year] MRN: [id number]  PRIMARY CARE PROVIDER:   [name], MD  REFERRING PROVIDER:  Sande Rives  RESPONSIBLE PARTY:   Daughter Jack Carter [phone]  ASSESSMENT:     Patient appears to be without significant changes since last seen. Case discussed with nursing staff and facility PA. Patient has several complaints about the care he is receiving and did not appear to be agreeable to conversation. No family present.   RECOMMENDATIONS and PLAN:  1.Continue supportive care 2. Recommend DNR  I spent 45 minutes providing this consultation,  from 1230 to 1315. More than 50% of the time in this consultation was spent coordinating communication.   HISTORY OF PRESENT ILLNESS:  Jack Carter is a 84 y.o. year old male with multiple medical problems including PD, dysphagia, CAD s/p CABG, CM with EF 40%, and chronic pain, who was hospitalized 10/07/17 to 10/14/17 with sepsis from PNA and UTI. Patient was seen in consultation by palliative care. Family opted to pursue rehab and we are now asked to follow at SNF.   CODE STATUS: FC  PPS: 30% HOSPICE ELIGIBILITY/DIAGNOSIS: TBD  PAST MEDICAL HISTORY:  Past Medical History:  Diagnosis Date  . Cellulitis   . Coronary artery disease    CABG  . Essential hypertension   . Heart attack (HCC)   . Hyperlipidemia   . Hypertension   . Parkinson's disease (HCC)   . Restless leg syndrome     SOCIAL HX:  Social History   Tobacco Use  . Smoking status: Former Games developer  . Smokeless tobacco: Never Used  Substance Use Topics  . Alcohol use: No    ALLERGIES: No Known Allergies   PERTINENT MEDICATIONS:  Outpatient Encounter Medications as of 10/26/2017  Medication Sig  . acetaminophen (TYLENOL) 500 MG tablet Take 1,000 mg by mouth 3 (three) times daily.   Marland Kitchen aspirin EC 81 MG tablet Take 81 mg by mouth daily.  Marland Kitchen atorvastatin (LIPITOR) 10 MG tablet Take 1 tablet (10  mg total) by mouth at bedtime.  . carbidopa-levodopa (SINEMET IR) 25-100 MG tablet Take 2 tablets by mouth 3 (three) times daily. At 8am, 12, 5pm. (Patient taking differently: Take 2 tablets by mouth 3 (three) times daily. At 10am, 2pm, 8pm.)  . citalopram (CELEXA) 10 MG tablet Take 1 tablet (10 mg total) by mouth daily.  . Cranberry 450 MG CAPS Take 1 capsule by mouth daily.  . Emollient (CETAPHIL) cream Apply 1 application topically at bedtime.  . famotidine (PEPCID) 20 MG tablet Take 20 mg by mouth daily.  . furosemide (LASIX) 20 MG tablet Place 3 tablets (60 mg total) into feeding tube 2 (two) times daily. Use only as needed for edema or weight gain 3 lbs in 24 hours or 5 lbs in 7 days.  Marland Kitchen gabapentin (NEURONTIN) 100 MG capsule Take 500 mg by mouth 3 (three) times daily.  . metoprolol tartrate (LOPRESSOR) 25 MG tablet Place 1 tablet (25 mg total) into feeding tube 2 (two) times daily.  . polyethylene glycol (MIRALAX / GLYCOLAX) packet Take 17 g by mouth daily.  . primidone (MYSOLINE) 50 MG tablet Take 1 tablet (50 mg total) by mouth every 12 (twelve) hours.   Facility-Administered Encounter Medications as of 10/26/2017  Medication  . incobotulinumtoxinA (XEOMIN) 100 units injection 500 Units    PHYSICAL EXAM:   General: NAD, frail appearing, thin, lying in bed Cardiovascular: regular rate and  rhythm Pulmonary: clear ant fields, unlabored Abdomen: soft, nontender, + bowel sounds Extremities: no edema, contractures of feet Skin: no rashes Neurological: Weakness, poor vocalization, oriented to person and place  Malachy MoanJOSHUA R Nicolaas Savo, NP

## 2017-10-28 ENCOUNTER — Non-Acute Institutional Stay: Payer: Non-veteran care | Admitting: Hospice and Palliative Medicine

## 2017-10-29 NOTE — Progress Notes (Signed)
Patient awake in bed without acute complaints. Comfortable appearing. Spoke with nursing staff who deny any significant changes. Patient eating well and participating with PT.

## 2017-12-16 ENCOUNTER — Ambulatory Visit: Payer: Self-pay | Admitting: Neurology

## 2017-12-16 ENCOUNTER — Telehealth: Payer: Self-pay | Admitting: *Deleted

## 2017-12-16 NOTE — Telephone Encounter (Signed)
No showed Xeomin appointment. 

## 2017-12-17 ENCOUNTER — Encounter: Payer: Self-pay | Admitting: Neurology

## 2017-12-25 ENCOUNTER — Non-Acute Institutional Stay: Payer: Medicare Other | Admitting: Internal Medicine

## 2017-12-25 DIAGNOSIS — Z515 Encounter for palliative care: Secondary | ICD-10-CM

## 2017-12-25 NOTE — Progress Notes (Signed)
    Patient not seen; was at physical therapy. Holly Bodily NP-C

## 2017-12-28 ENCOUNTER — Non-Acute Institutional Stay: Payer: Medicare Other | Admitting: Internal Medicine

## 2017-12-28 VITALS — BP 120/60 | HR 52 | Resp 12

## 2017-12-28 DIAGNOSIS — R634 Abnormal weight loss: Secondary | ICD-10-CM

## 2017-12-28 DIAGNOSIS — R531 Weakness: Secondary | ICD-10-CM

## 2017-12-28 DIAGNOSIS — G894 Chronic pain syndrome: Secondary | ICD-10-CM

## 2017-12-28 NOTE — Progress Notes (Signed)
PALLIATIVE CARE CONSULT VISIT   PATIENT NAME: Jack Carter DOB: 06-Dec-1933 MRN: [id number]  PRIMARY CARE PROVIDER:   [name], MD  REFERRING PROVIDER:  [name], MD [phone] [address] [place], [place] [address]  RESPONSIBLE PARTY: (dtr) Jack Carter [phone]  RECOMMENDATIONS and PLAN:  1.Cognitive decline r/t PD 2. Chronic Pain/Anxiety 3. Weakness 4.Protein calorie malnutrition. High risk of aspiration. 5. Advanced Care Directives. He is currently a full code with scope of care limited to no tube feedings, avoid ICU, no intubation. When I discussed this with patient, he indicated that this is something he would wish to discuss further with his daughter. -will contact daughter Jack Carter to f/u. Jack Boast NP had previously discussed the probable futility of CPR, particularly in the case of a patient that does not want to be intubated. She had said she would probably change him to a DNR.  6. Follow up: NP visit within the next 1-2 months  I spent 45 minutes providing this consultation.More than 50% of the time in this consultation was spent coordinating communication.   HISTORY OF PRESENT ILLNESS: Jack Carter is a 84 y.o. year old male with multiple medical problems including PD, dysphagia, CAD s/p CABG, CM with EF 40%, and chronic pain, who was hospitalized 10/07/17 to 10/14/17 with sepsis from PNA and UTI. Palliative Care continues to follow for ongoing discussions regarding goals of care.  CODE STATUS: Full code. MOST: Limited additional interventions (avoid ICU, no intubation), Yes to antibiotics and IVFs, no to tube feeding.   REVIEW OF SYSTEMS: Patient's PPS is 30%. He is 100% dependent for hygiene, dressing, transfers and feeding. He is incontinent of both bowel and bladder. Patient's voice is very soft and can be difficult to understand. His conversation is limited to 1-2 word responses or very short sentences due to dysphonia of end  stage PD. He's able to make his needs known. Staff note progression of this over the last two months. He is bed bound and is a heavy 2-person transfer to wheelchair. He is unable to reposition himself in bed.  A high back wheelchair was recently provided as he has increased tendency to lean to right side. Facility PT is working with him. He has severe bilateral LE contractures. Knees contracted to 60 degrees and bilateral foot drop. He has limited ROM bilateral upper extremities. He has LE pain r/t contractures, improved with anatomical alignment and pain medications (fentanyl patch and bid tramadol). Episodic agitation and hollering out improved with increase of Depakote. Continues Klonopin and citalopram. Variable appetite depending upon level of pain control. Patient needs to be fed. Oral intake 25-50% of meals. Takes 20-42min to feed as he tends to pocket food and takes time to swallow. He was evaluated by speech therapy for swallowing study. Therapist thought that he could trial thin liquids if he follows up with and improves with swallowing exercises and following of correct technique  HOSPICE ELIGIBILITY/DIAGNOSIS: TBD  PAST MEDICAL HISTORY:  Past Medical History:  Diagnosis Date  . Cellulitis   . Coronary artery disease    CABG  . Essential hypertension   . Heart attack (HCC)   . Hyperlipidemia   . Hypertension   . Parkinson's disease (HCC)   . Restless leg syndrome     SOCIAL HX:  Social History   Tobacco Use  . Smoking status: Former Games developer  . Smokeless tobacco: Never Used  Substance Use Topics  . Alcohol use: No  ALLERGIES: No Known Allergies   PERTINENT MEDICATIONS:  Outpatient Encounter Medications as of 12/25/2017  Medication Sig  . acetaminophen (TYLENOL) 500 MG tablet Take 1,000 mg by mouth 3 (three) times daily.   Marland Kitchen aspirin EC 81 MG tablet Take 81 mg by mouth daily.  Marland Kitchen atorvastatin (LIPITOR) 10 MG tablet Take 1 tablet (10 mg total) by mouth at bedtime.  .  carbidopa-levodopa (SINEMET IR) 25-100 MG tablet Take 2 tablets by mouth 3 (three) times daily. At 8am, 12, 5pm. (Patient taking differently: Take 2 tablets by mouth 3 (three) times daily. At 10am, 2pm, 8pm.)  . citalopram (CELEXA) 10 MG tablet Take 1 tablet (10 mg total) by mouth daily.  . clonazePAM (KLONOPIN) 0.5 MG tablet Take 1 tablet (0.5 mg total) by mouth 3 (three) times daily for 10 doses.  . Cranberry 450 MG CAPS Take 1 capsule by mouth daily.  . Emollient (CETAPHIL) cream Apply 1 application topically at bedtime.  . famotidine (PEPCID) 20 MG tablet Take 20 mg by mouth daily.  . furosemide (LASIX) 20 MG tablet Place 3 tablets (60 mg total) into feeding tube 2 (two) times daily. Use only as needed for edema or weight gain 3 lbs in 24 hours or 5 lbs in 7 days.  Marland Kitchen gabapentin (NEURONTIN) 100 MG capsule Take 500 mg by mouth 3 (three) times daily.  . metoprolol tartrate (LOPRESSOR) 25 MG tablet Place 1 tablet (25 mg total) into feeding tube 2 (two) times daily.  . polyethylene glycol (MIRALAX / GLYCOLAX) packet Take 17 g by mouth daily.  . primidone (MYSOLINE) 50 MG tablet Take 1 tablet (50 mg total) by mouth every 12 (twelve) hours.   Facility-Administered Encounter Medications as of 12/25/2017  Medication  . incobotulinumtoxinA (XEOMIN) 100 units injection 500 Units    PHYSICAL EXAM:  VS:BP 122/60, HR 52, RR 12 General: NAD, frail appearing, thin Cardiovascular: regular rate and rhythm, bradycardia Pulmonary: clear ant fields Abdomen: soft, nontender, + bowel sounds Extremities: no edema, no joint deformities. LE contractures at knees about 60degrees, with bilateral foot drop. Good peripheral pulses.  Skin: no rashes Neurological: Weakness but otherwise non-focal  Jack Lis, NP

## 2018-01-15 ENCOUNTER — Emergency Department (HOSPITAL_COMMUNITY): Payer: Medicare Other

## 2018-01-15 ENCOUNTER — Encounter (HOSPITAL_COMMUNITY): Payer: Self-pay | Admitting: Emergency Medicine

## 2018-01-15 ENCOUNTER — Inpatient Hospital Stay (HOSPITAL_COMMUNITY)
Admission: EM | Admit: 2018-01-15 | Discharge: 2018-01-31 | DRG: 871 | Disposition: E | Payer: Medicare Other | Attending: Internal Medicine | Admitting: Internal Medicine

## 2018-01-15 DIAGNOSIS — Z951 Presence of aortocoronary bypass graft: Secondary | ICD-10-CM

## 2018-01-15 DIAGNOSIS — R1310 Dysphagia, unspecified: Secondary | ICD-10-CM | POA: Diagnosis present

## 2018-01-15 DIAGNOSIS — F419 Anxiety disorder, unspecified: Secondary | ICD-10-CM | POA: Diagnosis present

## 2018-01-15 DIAGNOSIS — D631 Anemia in chronic kidney disease: Secondary | ICD-10-CM | POA: Diagnosis present

## 2018-01-15 DIAGNOSIS — L89151 Pressure ulcer of sacral region, stage 1: Secondary | ICD-10-CM | POA: Diagnosis present

## 2018-01-15 DIAGNOSIS — Z515 Encounter for palliative care: Secondary | ICD-10-CM | POA: Diagnosis present

## 2018-01-15 DIAGNOSIS — A419 Sepsis, unspecified organism: Principal | ICD-10-CM | POA: Diagnosis present

## 2018-01-15 DIAGNOSIS — G9341 Metabolic encephalopathy: Secondary | ICD-10-CM | POA: Diagnosis present

## 2018-01-15 DIAGNOSIS — L89201 Pressure ulcer of unspecified hip, stage 1: Secondary | ICD-10-CM | POA: Diagnosis not present

## 2018-01-15 DIAGNOSIS — Z634 Disappearance and death of family member: Secondary | ICD-10-CM

## 2018-01-15 DIAGNOSIS — Z87891 Personal history of nicotine dependence: Secondary | ICD-10-CM

## 2018-01-15 DIAGNOSIS — G2581 Restless legs syndrome: Secondary | ICD-10-CM | POA: Diagnosis present

## 2018-01-15 DIAGNOSIS — Z8249 Family history of ischemic heart disease and other diseases of the circulatory system: Secondary | ICD-10-CM

## 2018-01-15 DIAGNOSIS — G20 Parkinson's disease: Secondary | ICD-10-CM | POA: Diagnosis present

## 2018-01-15 DIAGNOSIS — I252 Old myocardial infarction: Secondary | ICD-10-CM

## 2018-01-15 DIAGNOSIS — G9340 Encephalopathy, unspecified: Secondary | ICD-10-CM | POA: Diagnosis present

## 2018-01-15 DIAGNOSIS — J690 Pneumonitis due to inhalation of food and vomit: Secondary | ICD-10-CM | POA: Diagnosis present

## 2018-01-15 DIAGNOSIS — Z681 Body mass index (BMI) 19 or less, adult: Secondary | ICD-10-CM

## 2018-01-15 DIAGNOSIS — Y95 Nosocomial condition: Secondary | ICD-10-CM | POA: Diagnosis present

## 2018-01-15 DIAGNOSIS — J189 Pneumonia, unspecified organism: Secondary | ICD-10-CM | POA: Diagnosis present

## 2018-01-15 DIAGNOSIS — Z87440 Personal history of urinary (tract) infections: Secondary | ICD-10-CM

## 2018-01-15 DIAGNOSIS — L89221 Pressure ulcer of left hip, stage 1: Secondary | ICD-10-CM | POA: Diagnosis present

## 2018-01-15 DIAGNOSIS — D649 Anemia, unspecified: Secondary | ICD-10-CM | POA: Diagnosis present

## 2018-01-15 DIAGNOSIS — N179 Acute kidney failure, unspecified: Secondary | ICD-10-CM | POA: Diagnosis present

## 2018-01-15 DIAGNOSIS — I2510 Atherosclerotic heart disease of native coronary artery without angina pectoris: Secondary | ICD-10-CM | POA: Diagnosis present

## 2018-01-15 DIAGNOSIS — Z66 Do not resuscitate: Secondary | ICD-10-CM | POA: Diagnosis present

## 2018-01-15 DIAGNOSIS — Z7982 Long term (current) use of aspirin: Secondary | ICD-10-CM

## 2018-01-15 DIAGNOSIS — I130 Hypertensive heart and chronic kidney disease with heart failure and stage 1 through stage 4 chronic kidney disease, or unspecified chronic kidney disease: Secondary | ICD-10-CM | POA: Diagnosis present

## 2018-01-15 DIAGNOSIS — L89211 Pressure ulcer of right hip, stage 1: Secondary | ICD-10-CM | POA: Diagnosis present

## 2018-01-15 DIAGNOSIS — E785 Hyperlipidemia, unspecified: Secondary | ICD-10-CM | POA: Diagnosis present

## 2018-01-15 DIAGNOSIS — I10 Essential (primary) hypertension: Secondary | ICD-10-CM | POA: Diagnosis present

## 2018-01-15 DIAGNOSIS — Z7189 Other specified counseling: Secondary | ICD-10-CM | POA: Diagnosis not present

## 2018-01-15 DIAGNOSIS — I700 Atherosclerosis of aorta: Secondary | ICD-10-CM | POA: Diagnosis present

## 2018-01-15 DIAGNOSIS — Z820 Family history of epilepsy and other diseases of the nervous system: Secondary | ICD-10-CM

## 2018-01-15 DIAGNOSIS — N182 Chronic kidney disease, stage 2 (mild): Secondary | ICD-10-CM | POA: Diagnosis present

## 2018-01-15 DIAGNOSIS — E870 Hyperosmolality and hypernatremia: Secondary | ICD-10-CM | POA: Diagnosis present

## 2018-01-15 DIAGNOSIS — I959 Hypotension, unspecified: Secondary | ICD-10-CM | POA: Diagnosis present

## 2018-01-15 DIAGNOSIS — I5042 Chronic combined systolic (congestive) and diastolic (congestive) heart failure: Secondary | ICD-10-CM | POA: Diagnosis present

## 2018-01-15 DIAGNOSIS — L8990 Pressure ulcer of unspecified site, unspecified stage: Secondary | ICD-10-CM

## 2018-01-15 DIAGNOSIS — Z79891 Long term (current) use of opiate analgesic: Secondary | ICD-10-CM

## 2018-01-15 DIAGNOSIS — Z809 Family history of malignant neoplasm, unspecified: Secondary | ICD-10-CM

## 2018-01-15 LAB — LACTIC ACID, PLASMA: LACTIC ACID, VENOUS: 2.3 mmol/L — AB (ref 0.5–1.9)

## 2018-01-15 LAB — CBC WITH DIFFERENTIAL/PLATELET
Abs Immature Granulocytes: 0.05 10*3/uL (ref 0.00–0.07)
BASOS ABS: 0 10*3/uL (ref 0.0–0.1)
Basophils Relative: 0 %
Eosinophils Absolute: 0 10*3/uL (ref 0.0–0.5)
Eosinophils Relative: 0 %
HCT: 40.5 % (ref 39.0–52.0)
Hemoglobin: 12.3 g/dL — ABNORMAL LOW (ref 13.0–17.0)
IMMATURE GRANULOCYTES: 1 %
Lymphocytes Relative: 14 %
Lymphs Abs: 1.5 10*3/uL (ref 0.7–4.0)
MCH: 30.2 pg (ref 26.0–34.0)
MCHC: 30.4 g/dL (ref 30.0–36.0)
MCV: 99.5 fL (ref 80.0–100.0)
Monocytes Absolute: 0.3 10*3/uL (ref 0.1–1.0)
Monocytes Relative: 3 %
NEUTROS ABS: 8.7 10*3/uL — AB (ref 1.7–7.7)
NEUTROS PCT: 82 %
PLATELETS: 111 10*3/uL — AB (ref 150–400)
RBC: 4.07 MIL/uL — AB (ref 4.22–5.81)
RDW: 18.3 % — AB (ref 11.5–15.5)
WBC: 10.6 10*3/uL — AB (ref 4.0–10.5)
nRBC: 0 % (ref 0.0–0.2)

## 2018-01-15 LAB — COMPREHENSIVE METABOLIC PANEL
ALBUMIN: 2.5 g/dL — AB (ref 3.5–5.0)
ALT: 8 U/L (ref 0–44)
ANION GAP: 8 (ref 5–15)
AST: 83 U/L — ABNORMAL HIGH (ref 15–41)
Alkaline Phosphatase: 112 U/L (ref 38–126)
BILIRUBIN TOTAL: 0.8 mg/dL (ref 0.3–1.2)
BUN: 38 mg/dL — ABNORMAL HIGH (ref 8–23)
CO2: 29 mmol/L (ref 22–32)
Calcium: 7.5 mg/dL — ABNORMAL LOW (ref 8.9–10.3)
Chloride: 110 mmol/L (ref 98–111)
Creatinine, Ser: 1.72 mg/dL — ABNORMAL HIGH (ref 0.61–1.24)
GFR calc Af Amer: 41 mL/min — ABNORMAL LOW (ref >60)
GFR, EST NON AFRICAN AMERICAN: 35 mL/min — AB (ref >60)
Glucose, Bld: 106 mg/dL — ABNORMAL HIGH (ref 70–99)
POTASSIUM: 4.6 mmol/L (ref 3.5–5.1)
Sodium: 147 mmol/L — ABNORMAL HIGH (ref 135–145)
Total Protein: 6.5 g/dL (ref 6.5–8.1)

## 2018-01-15 LAB — I-STAT CG4 LACTIC ACID, ED
LACTIC ACID, VENOUS: 3.22 mmol/L — AB (ref 0.5–1.9)
Lactic Acid, Venous: 2.19 mmol/L (ref 0.5–1.9)

## 2018-01-15 LAB — AMMONIA: AMMONIA: 41 umol/L — AB (ref 9–35)

## 2018-01-15 MED ORDER — ONDANSETRON HCL 4 MG PO TABS: 4.0000 mg | ORAL_TABLET | Freq: Four times a day (QID) | ORAL | Status: DC | PRN

## 2018-01-15 MED ORDER — ACETAMINOPHEN 650 MG RE SUPP: 650.0000 mg | Freq: Four times a day (QID) | RECTAL | Status: DC | PRN

## 2018-01-15 MED ORDER — CHLORHEXIDINE GLUCONATE 0.12 % MT SOLN
15.0000 mL | Freq: Two times a day (BID) | OROMUCOSAL | Status: DC
  Administered 2018-01-15 – 2018-01-16 (×2): 15 mL via OROMUCOSAL
  Filled 2018-01-15: qty 15

## 2018-01-15 MED ORDER — SODIUM CHLORIDE 0.9 % IV BOLUS (SEPSIS)
1000.0000 mL | Freq: Once | INTRAVENOUS | Status: AC
  Administered 2018-01-15: 1000 mL via INTRAVENOUS

## 2018-01-15 MED ORDER — VANCOMYCIN HCL IN DEXTROSE 1-5 GM/200ML-% IV SOLN
1000.0000 mg | Freq: Once | INTRAVENOUS | Status: AC
  Administered 2018-01-15: 1000 mg via INTRAVENOUS
  Filled 2018-01-15: qty 200

## 2018-01-15 MED ORDER — SODIUM CHLORIDE 0.9 % IV BOLUS (SEPSIS)
250.0000 mL | Freq: Once | INTRAVENOUS | Status: AC
  Administered 2018-01-15: 250 mL via INTRAVENOUS

## 2018-01-15 MED ORDER — FENTANYL 25 MCG/HR TD PT72
25.0000 ug | MEDICATED_PATCH | TRANSDERMAL | Status: DC
  Administered 2018-01-17 – 2018-01-20 (×2): 25 ug via TRANSDERMAL
  Filled 2018-01-15 (×2): qty 1

## 2018-01-15 MED ORDER — ENOXAPARIN SODIUM 30 MG/0.3ML ~~LOC~~ SOLN
30.0000 mg | SUBCUTANEOUS | Status: DC
  Administered 2018-01-16: 30 mg via SUBCUTANEOUS
  Filled 2018-01-15: qty 0.3

## 2018-01-15 MED ORDER — ORAL CARE MOUTH RINSE
15.0000 mL | Freq: Two times a day (BID) | OROMUCOSAL | Status: DC
  Administered 2018-01-16 – 2018-01-20 (×8): 15 mL via OROMUCOSAL

## 2018-01-15 MED ORDER — SODIUM CHLORIDE 0.9 % IV SOLN
2.0000 g | Freq: Once | INTRAVENOUS | Status: AC
  Administered 2018-01-15: 2 g via INTRAVENOUS
  Filled 2018-01-15: qty 2

## 2018-01-15 MED ORDER — ACETAMINOPHEN 650 MG RE SUPP
650.0000 mg | Freq: Once | RECTAL | Status: AC
  Administered 2018-01-15: 650 mg via RECTAL
  Filled 2018-01-15: qty 1

## 2018-01-15 MED ORDER — ACETAMINOPHEN 325 MG PO TABS: 650.0000 mg | ORAL_TABLET | Freq: Four times a day (QID) | ORAL | Status: DC | PRN

## 2018-01-15 MED ORDER — SODIUM CHLORIDE 0.9 % IV SOLN
INTRAVENOUS | Status: AC
  Administered 2018-01-15 – 2018-01-16 (×2): via INTRAVENOUS

## 2018-01-15 MED ORDER — SODIUM CHLORIDE 0.9 % IV SOLN: 1.0000 g | INTRAVENOUS | Status: DC

## 2018-01-15 MED ORDER — SODIUM CHLORIDE 0.9 % IV SOLN
1.0000 g | Freq: Three times a day (TID) | INTRAVENOUS | Status: DC
  Filled 2018-01-15 (×2): qty 1

## 2018-01-15 MED ORDER — METRONIDAZOLE IN NACL 5-0.79 MG/ML-% IV SOLN
500.0000 mg | Freq: Three times a day (TID) | INTRAVENOUS | Status: DC
  Administered 2018-01-15 – 2018-01-16 (×3): 500 mg via INTRAVENOUS
  Filled 2018-01-15 (×3): qty 100

## 2018-01-15 MED ORDER — LORAZEPAM 2 MG/ML IJ SOLN
1.0000 mg | Freq: Once | INTRAMUSCULAR | Status: AC
  Administered 2018-01-15: 0.5 mg via INTRAVENOUS
  Filled 2018-01-15: qty 1

## 2018-01-15 MED ORDER — VANCOMYCIN HCL IN DEXTROSE 750-5 MG/150ML-% IV SOLN: 750.0000 mg | INTRAVENOUS | Status: DC

## 2018-01-15 MED ORDER — ONDANSETRON HCL 4 MG/2ML IJ SOLN: 4.0000 mg | Freq: Four times a day (QID) | INTRAMUSCULAR | Status: DC | PRN

## 2018-01-15 NOTE — ED Notes (Signed)
Bed: [id number] Expected date:  Expected time:  Means of arrival:  Comments: EMS 84yo sepsis vs aspiration PNA

## 2018-01-15 NOTE — ED Notes (Signed)
Pt has mittens on because he was pulling at his wires and face mask

## 2018-01-15 NOTE — Progress Notes (Signed)
Pharmacy Antibiotic Note  Jack Carter is a 84 y.o. male admitted on 01/09/2018 with pneumonia.  Pharmacy has been consulted for vancomycin dosing.  Patient admitted from nursing home with decreased level of consciousness. Pharmacy consulted to dose vancomycin for PNA. Pt received vancomycin 1000 mg and cefepime 2 g in ED.  Today, 01/29/2018  SCr 1.72, CrCl ~26 mL/min  Lactate 2.3  Afebrile  WBC 10.6 slightly elevated  Plan:  Vancomycin 1000 mg LD followed by 750 mg IV q48h for estimated AUC ~400  Goal AUC 400-500  Cefepime 1 g IV q8h entered per MD. Will adjust dose to cefepime 1 g IV daily given current renal function  Monitor renal function, culture data, clinical course  Weight: 123 lb 0.3 oz (55.8 kg)  Temp (24hrs), Avg:98.9 F (37.2 C), Min:98.2 F (36.8 C), Max:99.5 F (37.5 C)  Recent Labs  Lab 01/27/2018 1717 01/23/2018 1723 01/10/2018 1911 01/28/2018 1942  WBC 10.6*  --   --   --   CREATININE 1.72*  --   --   --   LATICACIDVEN  --  3.22* 2.19* 2.3*    Estimated Creatinine Clearance: 26.1 mL/min (A) (by C-G formula based on SCr of 1.72 mg/dL (H)).    No Known Allergies  Antimicrobials this admission: cefepime 11/15 >>  vancomycin 11/15 >>   Dose adjustments this admission:  Microbiology results: 11/15 BCx: Sent 11/15 Sputum: Sent   Thank you for allowing pharmacy to be a part of this patient's care.  Cindi CarbonMary M Kaia Depaolis, PharmD Clinical Pharmacist 01/19/2018 9:31 PM

## 2018-01-15 NOTE — ED Notes (Signed)
MD AND RN NOTIFIED OF PATIENT'S LACTIC ACID LEVEL OF 3.22

## 2018-01-15 NOTE — ED Triage Notes (Signed)
Facility reports patient having altered mental status and today becoming non verbal.

## 2018-01-15 NOTE — ED Provider Notes (Signed)
[name]-EMERGENCY DEPT Provider Note   CSN: [id number] Arrival date & time: January 19, 2018  1556     History   Chief Complaint No chief complaint on file.   HPI Jack Carter is a 84 y.o. male.  This is a 84 year old male who presents from nursing home due to several days of decreased level of consciousness.  Patient is now not as verbal as he normally is.  EMS was called and patient found to have a temperature of 101.  Some question of aspiration pneumonia.  Patient transported for further management.     Past Medical History:  Diagnosis Date  . Cellulitis   . Coronary artery disease    CABG  . Essential hypertension   . Heart attack (HCC)   . Hyperlipidemia   . Hypertension   . Parkinson's disease (HCC)   . Restless leg syndrome     Patient Active Problem List   Diagnosis Date Noted  . Essential hypertension 10/11/2017  . Chronic combined systolic and diastolic CHF (congestive heart failure) (HCC) 10/11/2017  . Cellulitis of right foot 10/08/2017  . UTI due to Klebsiella species 10/07/2017  . Parkinsonism (HCC) 07/16/2017  . Other dystonia 07/16/2017  . Anxiety and depression   . Acute cystitis with hematuria   . Acute lower UTI 02/19/2017  . Pneumonia 02/19/2017  . CAP (community acquired pneumonia) 02/18/2017  . 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 Acute tracheostomy management (HCC)   . Bradycardia, sinus   . Dyspnea   . Acute respiratory failure (HCC)   . Coronary artery disease involving native coronary artery of native heart without angina pectoris   . Acute on chronic combined systolic and diastolic CHF (congestive heart failure) (HCC)   . Great toe pain, unspecified laterality   . Aspiration into airway   . Hypernatremia   . Elevated troponin 11/04/2016  . Acute encephalopathy   . Goals of care, counseling/discussion   . Septic shock (HCC) 10/31/2016  . Cellulitis 10/31/2016  .  Hyperkalemia 10/31/2016  . AKI (acute kidney injury) (HCC) 10/31/2016  . Cardiogenic shock (HCC) 10/31/2016  . Acute hypoxemic respiratory failure (HCC) 10/31/2016  . CAD (coronary artery disease) of artery bypass graft 10/31/2016  . CHF (congestive heart failure) (HCC) 10/31/2016  . Parkinson disease (HCC) 10/31/2016  . Upper GI bleed 10/31/2016    Past Surgical History:  Procedure Laterality Date  . APPENDECTOMY    . CARDIAC SURGERY  2000  . CARPAL TUNNEL RELEASE Left   . CORONARY ARTERY BYPASS GRAFT    . IR GASTROSTOMY TUBE MOD SED  12/10/2016  . IR GASTROSTOMY TUBE REMOVAL  04/20/2017  . STOMACH SURGERY  2003   intestinal fissure        Home Medications    Prior to Admission medications   Medication Sig Start Date End Date Taking? Authorizing Provider  acetaminophen (TYLENOL) 500 MG tablet Take 1,000 mg by mouth 3 (three) times daily.     [provider]  aspirin EC 81 MG tablet Take 81 mg by mouth daily.    [provider]  atorvastatin (LIPITOR) 10 MG tablet Take 1 tablet (10 mg total) by mouth at bedtime. 11/18/16   Desai, Rahul P, PA-C  carbidopa-levodopa (SINEMET IR) 25-100 MG tablet Take 2 tablets by mouth 3 (three) times daily. At 8am, 12, 5pm. Patient taking differently: Take 2 tablets by mouth 3 (three) times daily. At 10am, 2pm, 8pm. 07/16/17   Terrace Arabia,  Vivia Ewing, MD  citalopram (CELEXA) 10 MG tablet Take 1 tablet (10 mg total) by mouth daily. 02/24/17   Maxie Barb, MD  clonazePAM (KLONOPIN) 0.5 MG tablet Take 1 tablet (0.5 mg total) by mouth 3 (three) times daily for 10 doses. 10/14/17 10/18/17  Arrien, York Ram, MD  Cranberry 450 MG CAPS Take 1 capsule by mouth daily.    [provider]  Emollient (CETAPHIL) cream Apply 1 application topically at bedtime.    [provider]  famotidine (PEPCID) 20 MG tablet Take 20 mg by mouth daily.    [provider]  furosemide (LASIX) 20 MG tablet Place 3 tablets (60 mg  total) into feeding tube 2 (two) times daily. Use only as needed for edema or weight gain 3 lbs in 24 hours or 5 lbs in 7 days. 10/14/17   Arrien, York Ram, MD  gabapentin (NEURONTIN) 100 MG capsule Take 500 mg by mouth 3 (three) times daily.    [provider]  metoprolol tartrate (LOPRESSOR) 25 MG tablet Place 1 tablet (25 mg total) into feeding tube 2 (two) times daily. 11/18/16   Desai, Rahul P, PA-C  polyethylene glycol (MIRALAX / GLYCOLAX) packet Take 17 g by mouth daily. 02/24/17   Maxie Barb, MD  primidone (MYSOLINE) 50 MG tablet Take 1 tablet (50 mg total) by mouth every 12 (twelve) hours. 11/18/16   Kathlene Cote, PA-C    Family History Family History  Problem Relation Age of Onset  . Heart disease Mother   . Cancer Mother   . Pneumonia Father     Social History Social History   Tobacco Use  . Smoking status: Former Games developer  . Smokeless tobacco: Never Used  Substance Use Topics  . Alcohol use: No  . Drug use: No     Allergies   Patient has no known allergies.   Review of Systems Review of Systems  Unable to perform ROS: Mental status change     Physical Exam Updated Vital Signs There were no vitals taken for this visit.  Physical Exam  Constitutional: He appears well-developed and well-nourished. He appears lethargic.  Non-toxic appearance. No distress.  HENT:  Head: Normocephalic and atraumatic.  Eyes: Pupils are equal, round, and reactive to light. Conjunctivae, EOM and lids are normal.  Neck: Normal range of motion. Neck supple. No tracheal deviation present. No thyroid mass present.  Cardiovascular: Normal rate, regular rhythm and normal heart sounds. Exam reveals no gallop.  No murmur heard. Pulmonary/Chest: No stridor. Tachypnea noted. He has decreased breath sounds in the right lower field and the left lower field. He has no wheezes. He has no rhonchi. He has no rales.  Abdominal: Soft. Normal appearance and bowel sounds are  normal. He exhibits no distension. There is no tenderness. There is no rebound and no CVA tenderness.  Musculoskeletal: Normal range of motion. He exhibits no edema or tenderness.  Neurological: He appears lethargic. He displays atrophy. No cranial nerve deficit. GCS eye subscore is 4. GCS verbal subscore is 4. GCS motor subscore is 5.  Skin: Skin is warm and dry. No abrasion and no rash noted.  Psychiatric: His affect is blunt. He is inattentive.  Nursing note and vitals reviewed.    ED Treatments / Results  Labs (all labs ordered are listed, but only abnormal results are displayed) Labs Reviewed  CULTURE, BLOOD (ROUTINE X 2)  CULTURE, BLOOD (ROUTINE X 2)  COMPREHENSIVE METABOLIC PANEL  CBC WITH DIFFERENTIAL/PLATELET  URINALYSIS, ROUTINE  W REFLEX MICROSCOPIC  I-STAT CG4 LACTIC ACID, ED    EKG EKG Interpretation  Date/Time:  Friday January 15 2018 16:04:39 EST Ventricular Rate:  56 PR Interval:    QRS Duration: 121 QT Interval:  482 QTC Calculation: 466 R Axis:   79 Text Interpretation:  Age not entered, assumed to be  84 years old for purpose of ECG interpretation Sinus rhythm Nonspecific intraventricular conduction delay Borderline repolarization abnormality Baseline wander in lead(s) V6 Confirmed by Lorre NickAllen, Gerardine Peltz ([id number]) on 05/02/17 5:38:01 PM   Radiology No results found.  Procedures Procedures (including critical care time)  Medications Ordered in ED Medications  sodium chloride 0.9 % bolus 1,000 mL (has no administration in time range)    And  sodium chloride 0.9 % bolus 1,000 mL (has no administration in time range)    And  sodium chloride 0.9 % bolus 250 mL (has no administration in time range)  ceFEPIme (MAXIPIME) 2 g in sodium chloride 0.9 % 100 mL IVPB (has no administration in time range)  metroNIDAZOLE (FLAGYL) IVPB 500 mg (has no administration in time range)  vancomycin (VANCOCIN) IVPB 1000 mg/200 mL premix (has no administration in time range)      Initial Impression / Assessment and Plan / ED Course  I have reviewed the triage vital signs and the nursing notes.  Pertinent labs & imaging results that were available during my care of the patient were reviewed by me and considered in my medical decision making (see chart for details).    Code sepsis activated and pt given iv fluids and started on abx cxr c/w pna Will admit to hospital  CRITICAL CARE Performed by: Toy BakerAnthony T Jiah Bari Total critical care time: 55 minutes Critical care time was exclusive of separately billable procedures and treating other patients. Critical care was necessary to treat or prevent imminent or life-threatening deterioration. Critical care was time spent personally by me on the following activities: development of treatment plan with patient and/or surrogate as well as nursing, discussions with consultants, evaluation of patient's response to treatment, examination of patient, obtaining history from patient or surrogate, ordering and performing treatments and interventions, ordering and review of laboratory studies, ordering and review of radiographic studies, pulse oximetry and re-evaluation of patient's condition.   Final Clinical Impressions(s) / ED Diagnoses   Final diagnoses:  None    ED Discharge Orders    None       Lorre NickAllen, Blue Ruggerio, MD 11/26/2017 1901

## 2018-01-15 NOTE — H&P (Signed)
History and Physical    Jack Carter UJW:[id number] DOB: 1934/01/18 DOA: February 12, 2018  PCP: [name], MD  Patient coming from: Skilled nursing facility.  Chief Complaint: Altered mental status.  History obtained from ER physician and notes from the skilled nursing facility as patient is confused and no family at the bedside.  HPI: Jack Carter is a 84 y.o. male with history of CAD status post CABG, Parkinson's disease with dysphagia who was admitted in August of this year 3 months ago for sepsis from UTI at that time it was found to be ESBL was found to be increasingly confused last few days and was found to be hypotensive with fever 101 F and last few days has become less verbal.  Patient was referred to the ER.  ED Course: In the ER on exam patient has coarse crepitations bilaterally with chest x-ray showing multifocal pneumonia.  Patient follows commands and moves all extremities and responds to his name.  Otherwise is encephalopathic.  Requiring 100% nonrebreather.  Patient has most form which states patient not to be intubated but CPR is okay.  Patient was started on empiric antibiotics for pneumonia and admitted for further management.  EKG shows normal sinus rhythm.  Creatinine which was around 1.7 and sodium is 147.  Lactate was elevated for which patient received fluids.  Review of Systems: As per HPI, rest all negative.   Past Medical History:  Diagnosis Date  . Cellulitis   . Coronary artery disease    CABG  . Essential hypertension   . Heart attack (HCC)   . Hyperlipidemia   . Hypertension   . Parkinson's disease (HCC)   . Restless leg syndrome     Past Surgical History:  Procedure Laterality Date  . APPENDECTOMY    . CARDIAC SURGERY  2000  . CARPAL TUNNEL RELEASE Left   . CORONARY ARTERY BYPASS GRAFT    . IR GASTROSTOMY TUBE MOD SED  12/10/2016  . IR GASTROSTOMY TUBE REMOVAL  04/20/2017  . STOMACH SURGERY  2003   intestinal fissure     reports that he has quit smoking. He has never used smokeless tobacco. He reports that he does not drink alcohol or use drugs.  No Known Allergies  Family History  Problem Relation Age of Onset  . Heart disease Mother   . Cancer Mother   . Pneumonia Father     Prior to Admission medications   Medication Sig Start Date End Date Taking? Authorizing Provider  acetaminophen (TYLENOL) 500 MG tablet Take 1,000 mg by mouth 3 (three) times daily.    Yes [provider]  aspirin EC 81 MG tablet Take 81 mg by mouth daily.   Yes [provider]  atorvastatin (LIPITOR) 10 MG tablet Take 1 tablet (10 mg total) by mouth at bedtime. 11/18/16  Yes Desai, Rahul P, PA-C  carbidopa-levodopa (SINEMET IR) 25-100 MG tablet Take 2 tablets by mouth 3 (three) times daily. At 8am, 12, 5pm. Patient taking differently: Take 2 tablets by mouth 3 (three) times daily. At 9am, 1pm, 9pm. 07/16/17  Yes Levert Feinstein, MD  citalopram (CELEXA) 20 MG tablet Take 20 mg by mouth daily.   Yes [provider]  clonazePAM (KLONOPIN) 0.5 MG tablet Take 0.5 mg by mouth 2 (two) times daily.   Yes [provider]  Cranberry 450 MG CAPS Take 1 capsule by mouth daily.   Yes [provider]  divalproex (DEPAKOTE) 125 MG DR tablet Take 125  mg by mouth 3 (three) times daily.   Yes [provider]  famotidine (PEPCID) 20 MG tablet Take 20 mg by mouth daily.   Yes [provider]  fentaNYL (DURAGESIC - DOSED MCG/HR) 25 MCG/HR patch Place 25 mcg onto the skin every 3 (three) days.   Yes [provider]  gabapentin (NEURONTIN) 100 MG capsule Take 100 mg by mouth 3 (three) times daily. Take along with 400 mg capsule=500 mg   Yes [provider]  gabapentin (NEURONTIN) 400 MG capsule Take 400 mg by mouth 3 (three) times daily. Take along with 100 mg capsule=500 mg   Yes [provider]  magnesium hydroxide (MILK OF MAGNESIA) 400 MG/5ML suspension Take 30 mLs by  mouth daily as needed for mild constipation.   Yes [provider]  metoprolol tartrate (LOPRESSOR) 25 MG tablet Place 1 tablet (25 mg total) into feeding tube 2 (two) times daily. Patient taking differently: Take 25 mg by mouth 2 (two) times daily.  11/18/16  Yes Desai, Rahul P, PA-C  polyethylene glycol (MIRALAX / GLYCOLAX) packet Take 17 g by mouth daily. 02/24/17  Yes Maxie Barb, MD  primidone (MYSOLINE) 50 MG tablet Take 1 tablet (50 mg total) by mouth every 12 (twelve) hours. 11/18/16  Yes Desai, Rahul P, PA-C  traMADol (ULTRAM) 50 MG tablet Take 50 mg by mouth 2 (two) times daily as needed for moderate pain.   Yes [provider]  citalopram (CELEXA) 10 MG tablet Take 1 tablet (10 mg total) by mouth daily. Patient not taking: Reported on 01/02/2018 02/24/17   Maxie Barb, MD  clonazePAM (KLONOPIN) 0.5 MG tablet Take 1 tablet (0.5 mg total) by mouth 3 (three) times daily for 10 doses. 10/14/17 10/18/17  Arrien, York Ram, MD  furosemide (LASIX) 20 MG tablet Place 3 tablets (60 mg total) into feeding tube 2 (two) times daily. Use only as needed for edema or weight gain 3 lbs in 24 hours or 5 lbs in 7 days. Patient taking differently: Place 60 mg into feeding tube 2 (two) times daily as needed. Use only as needed for edema or weight gain 3 lbs in 24 hours or 5 lbs in 7 days. 10/14/17   Coralie Keens, MD    Physical Exam: Vitals:   01/16/2018 1915 01/17/2018 1930 01/07/2018 1935 01/14/2018 2015  BP: 123/62 121/64 121/64 130/64  Pulse: 66 70 71 72  Resp: 18 20 18 18   Temp:      TempSrc:      SpO2: 96% 93% 96% 90%      Constitutional: Moderately built and nourished. Vitals:   01/11/2018 1915 01/29/2018 1930 01/09/2018 1935 01/24/2018 2015  BP: 123/62 121/64 121/64 130/64  Pulse: 66 70 71 72  Resp: 18 20 18 18   Temp:      TempSrc:      SpO2: 96% 93% 96% 90%   Eyes: Anicteric no pallor present ENMT: No discharge from the ears eyes nose and  mouth. Neck: No mass felt.  No neck rigidity. Respiratory: Bilateral coarse crepitations. Cardiovascular: S1-S2 heard. Abdomen: Soft nontender bowel sounds present. Musculoskeletal: No edema. Skin: No rash. Neurologic: Confused oriented to his name moves all extremities. Psychiatric: Confused.   Labs on Admission: I have personally reviewed following labs and imaging studies  CBC: Recent Labs  Lab 01/27/2018 1717  WBC 10.6*  NEUTROABS PENDING  HGB 12.3*  HCT 40.5  MCV 99.5  PLT 111*   Basic Metabolic Panel: Recent Labs  Lab 01/23/2018 1717  NA 147*  K 4.6  CL 110  CO2 29  GLUCOSE 106*  BUN 38*  CREATININE 1.72*  CALCIUM 7.5*   GFR: CrCl cannot be calculated (Unknown ideal weight.). Liver Function Tests: Recent Labs  Lab 01/26/2018 1717  AST 83*  ALT 8  ALKPHOS 112  BILITOT 0.8  PROT 6.5  ALBUMIN 2.5*   No results for input(s): LIPASE, AMYLASE in the last 168 hours. No results for input(s): AMMONIA in the last 168 hours. Coagulation Profile: No results for input(s): INR, PROTIME in the last 168 hours. Cardiac Enzymes: No results for input(s): CKTOTAL, CKMB, CKMBINDEX, TROPONINI in the last 168 hours. BNP (last 3 results) No results for input(s): PROBNP in the last 8760 hours. HbA1C: No results for input(s): HGBA1C in the last 72 hours. CBG: No results for input(s): GLUCAP in the last 168 hours. Lipid Profile: No results for input(s): CHOL, HDL, LDLCALC, TRIG, CHOLHDL, LDLDIRECT in the last 72 hours. Thyroid Function Tests: No results for input(s): TSH, T4TOTAL, FREET4, T3FREE, THYROIDAB in the last 72 hours. Anemia Panel: No results for input(s): VITAMINB12, FOLATE, FERRITIN, TIBC, IRON, RETICCTPCT in the last 72 hours. Urine analysis:    Component Value Date/Time   COLORURINE YELLOW 10/07/2017 1950   APPEARANCEUR CLEAR 10/07/2017 1950   LABSPEC 1.016 10/07/2017 1950   PHURINE 6.0 10/07/2017 1950   GLUCOSEU NEGATIVE 10/07/2017 1950   HGBUR LARGE  (A) 10/07/2017 1950   BILIRUBINUR NEGATIVE 10/07/2017 1950   KETONESUR NEGATIVE 10/07/2017 1950   PROTEINUR NEGATIVE 10/07/2017 1950   NITRITE NEGATIVE 10/07/2017 1950   LEUKOCYTESUR LARGE (A) 10/07/2017 1950   Sepsis Labs: @LABRCNTIP (procalcitonin:4,lacticidven:4) )No results found for this or any previous visit (from the past 240 hour(s)).   Radiological Exams on Admission: Dg Chest Port 1 View  Result Date: 01/30/2018 CLINICAL DATA:  Altered mental status, shortness of breath EXAM: PORTABLE CHEST 1 VIEW COMPARISON:  10/07/2017 FINDINGS: Multifocal patchy opacities in the right upper lobe and bilateral lower lobes, suspicious for pneumonia, right lower lobe predominant. No pleural effusion or pneumothorax. Postsurgical changes in the left lung apex. The heart is normal in size. Postsurgical changes related to prior CABG. Thoracic aortic atherosclerosis. IMPRESSION: Multifocal patchy opacities, suspicious for pneumonia, right lower lobe predominant. Electronically Signed   By: Charline Bills M.D.   On: 01/16/2018 17:45    EKG: Independently reviewed.  Normal sinus rhythm with nonspecific intraventricular conduction delay.  Assessment/Plan Active Problems:   Acute encephalopathy   Parkinsonism (HCC)   Essential hypertension   Chronic combined systolic and diastolic CHF (congestive heart failure) (HCC)   HCAP (healthcare-associated pneumonia)    1. Healthcare associated pneumonia/aspiration pneumonia -on empiric antibiotics.  Will get swallow evaluation.  Check urine for Legionella strep antigen and sputum cultures influenza PCR.  Continue with hydration follow lactate and procalcitonin levels. 2. Acute encephalopathy likely from pneumonia and infection.  UA is pending.  Get CT head.  Check Depakote and ammonia levels. 3. Parkinsonism on Sinemet -if patient fails swallow will need NG tube and continuing Sinemet. 4. Hypertension we will closely monitor blood pressure trend since  patient was initially mildly hypotensive. 5. Chronic combined systolic and diastolic CHF last EF measured was 40 to 45% with grade 2 diastolic dysfunction September 2018.  Presently receiving fluids. 6. Hypernatremia likely from dehydration follow metabolic panel closely. 7. Anemia appears to be chronic follow CBC. 8. Chronic kidney disease stage II creatinine appears to be at baseline.   DVT prophylaxis: Lovenox. Code  Status: DO NOT INTUBATE. Family Communication: No family is at bedside. Disposition Plan: Back to facility when patient stable. Consults called: None. Admission status: Inpatient.   Eduard ClosArshad N Agusta Hackenberg MD [facility] Pager [phone].  If 7PM-7AM, please contact night-coverage [email or web address] Password [email or web address]  01/19/2018, 9:15 PM

## 2018-01-15 NOTE — ED Notes (Signed)
Unable to obtain second set of cultures.

## 2018-01-15 NOTE — ED Notes (Signed)
MD AND RN NOTIFIED OF PATIENT'S LACTIC ACID LEVEL OF 2.19

## 2018-01-15 NOTE — Progress Notes (Signed)
A consult was received from an ED physician for vancomycin and cefepime per pharmacy dosing.  The patient's profile has been reviewed for ht/wt/allergies/indication/available labs.    STAT height/weight ordered ordered. No patient weight in chart. Will give vancomycin 1000 mg IV dose  A one time order has been placed for vancomycin 1000 mg and cefepime 2 g .  Further antibiotics/pharmacy consults should be ordered by admitting physician if indicated.                       Thank you, Cindi CarbonMary M Catrice Zuleta, PharmD Clinical Pharmacist 2017/06/06  5:35 PM

## 2018-01-16 ENCOUNTER — Inpatient Hospital Stay (HOSPITAL_COMMUNITY): Payer: Medicare Other

## 2018-01-16 DIAGNOSIS — I10 Essential (primary) hypertension: Secondary | ICD-10-CM

## 2018-01-16 DIAGNOSIS — I5042 Chronic combined systolic (congestive) and diastolic (congestive) heart failure: Secondary | ICD-10-CM

## 2018-01-16 DIAGNOSIS — L8990 Pressure ulcer of unspecified site, unspecified stage: Secondary | ICD-10-CM

## 2018-01-16 LAB — INFLUENZA PANEL BY PCR (TYPE A & B)
INFLBPCR: NEGATIVE
Influenza A By PCR: NEGATIVE

## 2018-01-16 LAB — URINALYSIS, ROUTINE W REFLEX MICROSCOPIC
Bilirubin Urine: NEGATIVE
Glucose, UA: NEGATIVE mg/dL
KETONES UR: 5 mg/dL — AB
Leukocytes, UA: NEGATIVE
Nitrite: NEGATIVE
PH: 5 (ref 5.0–8.0)
PROTEIN: 30 mg/dL — AB
Specific Gravity, Urine: 1.027 (ref 1.005–1.030)

## 2018-01-16 LAB — CBC WITH DIFFERENTIAL/PLATELET
Abs Immature Granulocytes: 0.04 10*3/uL (ref 0.00–0.07)
BASOS ABS: 0 10*3/uL (ref 0.0–0.1)
Basophils Relative: 0 %
EOS ABS: 0 10*3/uL (ref 0.0–0.5)
EOS PCT: 0 %
HCT: 39.6 % (ref 39.0–52.0)
Hemoglobin: 12 g/dL — ABNORMAL LOW (ref 13.0–17.0)
IMMATURE GRANULOCYTES: 1 %
LYMPHS ABS: 0.9 10*3/uL (ref 0.7–4.0)
Lymphocytes Relative: 11 %
MCH: 30.4 pg (ref 26.0–34.0)
MCHC: 30.3 g/dL (ref 30.0–36.0)
MCV: 100.3 fL — ABNORMAL HIGH (ref 80.0–100.0)
Monocytes Absolute: 0.2 10*3/uL (ref 0.1–1.0)
Monocytes Relative: 2 %
NEUTROS PCT: 86 %
Neutro Abs: 7.2 10*3/uL (ref 1.7–7.7)
PLATELETS: 107 10*3/uL — AB (ref 150–400)
RBC: 3.95 MIL/uL — AB (ref 4.22–5.81)
RDW: 18.1 % — AB (ref 11.5–15.5)
WBC: 8.4 10*3/uL (ref 4.0–10.5)
nRBC: 0 % (ref 0.0–0.2)

## 2018-01-16 LAB — BASIC METABOLIC PANEL
ANION GAP: 9 (ref 5–15)
BUN: 31 mg/dL — ABNORMAL HIGH (ref 8–23)
CO2: 23 mmol/L (ref 22–32)
CREATININE: 1.11 mg/dL (ref 0.61–1.24)
Calcium: 7.6 mg/dL — ABNORMAL LOW (ref 8.9–10.3)
Chloride: 113 mmol/L — ABNORMAL HIGH (ref 98–111)
GFR, EST NON AFRICAN AMERICAN: 60 mL/min — AB (ref >60)
GLUCOSE: 93 mg/dL (ref 70–99)
Potassium: 3.7 mmol/L (ref 3.5–5.1)
Sodium: 145 mmol/L (ref 135–145)

## 2018-01-16 LAB — TROPONIN I: Troponin I: <0.03 ng/mL (ref <0.03)

## 2018-01-16 LAB — HEPATIC FUNCTION PANEL
ALBUMIN: 2.3 g/dL — AB (ref 3.5–5.0)
ALT: 22 U/L (ref 0–44)
AST: 77 U/L — AB (ref 15–41)
Alkaline Phosphatase: 100 U/L (ref 38–126)
BILIRUBIN DIRECT: 0.6 mg/dL — AB (ref 0.0–0.2)
BILIRUBIN TOTAL: 1.2 mg/dL (ref 0.3–1.2)
Indirect Bilirubin: 0.6 mg/dL (ref 0.3–0.9)
Total Protein: 5.9 g/dL — ABNORMAL LOW (ref 6.5–8.1)

## 2018-01-16 LAB — VALPROIC ACID LEVEL: Valproic Acid Lvl: 14 ug/mL — ABNORMAL LOW (ref 50.0–100.0)

## 2018-01-16 LAB — LACTIC ACID, PLASMA
LACTIC ACID, VENOUS: 1.4 mmol/L (ref 0.5–1.9)
Lactic Acid, Venous: 1.4 mmol/L (ref 0.5–1.9)

## 2018-01-16 LAB — MRSA PCR SCREENING: MRSA BY PCR: NEGATIVE

## 2018-01-16 LAB — PROCALCITONIN

## 2018-01-16 MED ORDER — POLYETHYLENE GLYCOL 3350 17 G PO PACK: 17.0000 g | PACK | Freq: Every day | ORAL | Status: DC

## 2018-01-16 MED ORDER — METOPROLOL TARTRATE 25 MG PO TABS: 25.0000 mg | ORAL_TABLET | Freq: Two times a day (BID) | ORAL | Status: DC

## 2018-01-16 MED ORDER — ASPIRIN EC 81 MG PO TBEC: 81.0000 mg | DELAYED_RELEASE_TABLET | Freq: Every day | ORAL | Status: DC

## 2018-01-16 MED ORDER — LORAZEPAM 2 MG/ML IJ SOLN
1.0000 mg | INTRAMUSCULAR | Status: DC | PRN
  Administered 2018-01-20 – 2018-01-21 (×3): 1 mg via INTRAVENOUS
  Filled 2018-01-16 (×3): qty 1

## 2018-01-16 MED ORDER — GLYCOPYRROLATE NICU IV SYRINGE 0.2 MG/ML: 4.0000 ug/kg | Freq: Three times a day (TID) | INTRAMUSCULAR | Status: DC | PRN

## 2018-01-16 MED ORDER — BISACODYL 10 MG RE SUPP: 10.0000 mg | Freq: Every day | RECTAL | Status: DC | PRN

## 2018-01-16 MED ORDER — MORPHINE SULFATE (PF) 2 MG/ML IV SOLN
2.0000 mg | INTRAVENOUS | Status: DC | PRN
  Administered 2018-01-17 (×2): 2 mg via INTRAVENOUS
  Filled 2018-01-16 (×2): qty 1

## 2018-01-16 MED ORDER — DIVALPROEX SODIUM 125 MG PO DR TAB
125.0000 mg | DELAYED_RELEASE_TABLET | Freq: Three times a day (TID) | ORAL | Status: DC
  Filled 2018-01-16 (×2): qty 1

## 2018-01-16 MED ORDER — SODIUM CHLORIDE 0.9 % IV SOLN
1.0000 g | Freq: Two times a day (BID) | INTRAVENOUS | Status: DC
  Filled 2018-01-16: qty 1

## 2018-01-16 MED ORDER — GLYCOPYRROLATE 0.2 MG/ML IJ SOLN
0.2000 mg | Freq: Three times a day (TID) | INTRAMUSCULAR | Status: DC | PRN
  Filled 2018-01-16: qty 1

## 2018-01-16 MED ORDER — VANCOMYCIN HCL IN DEXTROSE 750-5 MG/150ML-% IV SOLN
750.0000 mg | INTRAVENOUS | Status: DC
  Filled 2018-01-16: qty 150

## 2018-01-16 MED ORDER — PRIMIDONE 50 MG PO TABS
50.0000 mg | ORAL_TABLET | Freq: Two times a day (BID) | ORAL | Status: DC
  Filled 2018-01-16: qty 1

## 2018-01-16 MED ORDER — CARBIDOPA-LEVODOPA 25-100 MG PO TABS
2.0000 | ORAL_TABLET | ORAL | Status: DC
  Administered 2018-01-16: 2 via ORAL
  Filled 2018-01-16: qty 2

## 2018-01-16 MED ORDER — ENOXAPARIN SODIUM 40 MG/0.4ML ~~LOC~~ SOLN: 40.0000 mg | SUBCUTANEOUS | Status: DC

## 2018-01-16 NOTE — Progress Notes (Signed)
PROGRESS NOTE  Jack Carter:[id number] DOB: 10-19-1933 DOA: 01/22/2018 PCP: [name], MD   LOS: 1 day   Brief narrative:  Jack Carter is a 84 y.o. male with history of CAD status post CABG, Parkinson's disease with dysphagia who was admitted in August of this year for sepsis from ESBL UTI. He was increasingly confused last few days and was found to be hypotensive with fever 101 F and last few days has become less verbal.  In the ER, she had coarse crepitation and a chest x-ray showed multifocal pneumonia and was mildly encephalopathic.  He initially required 100% nonrebreather mask.  Patient is DO NOT INTUBATE but is okay with CPR.  Patient was started on empiric antibiotics, IV fluid bolus for elevated lactic/sepsis in the ED and was considered for admission to the hospital.  Creatinine was elevated on presentation as well.  Assessment/Plan:  Active Problems:   Acute encephalopathy   Parkinsonism (HCC)   Essential hypertension   Chronic combined systolic and diastolic CHF (congestive heart failure) (HCC)   HCAP (healthcare-associated pneumonia)   Pressure injury of skin  Acute metabolic encephalopathy likely secondary to aspiration pneumonia.  Will check for speech and swallow evaluation.  Patient does have history of dysphagia and was pureed diet at home at some point.  Continue IV antibiotics.  Follow urine cultures blood cultures.  Urinalysis showed 6-10 white cells.  Lactate was 1.4.  Procalcitonin less than 0.10.  Had mild leukocytosis on presentation.  Legionella and strep pneumonia antigen negative.  Debility, deconditioning with history of parkinsonism.  Will get physical therapy, occupational therapy evaluation.  Resume Sinemet.  Hypernatremia on presentation improved with IV fluids.  Will monitor closely.  Acute kidney injury likely secondary to hypotension, sepsis.  Elevated BUN.  We will closely monitor BMP.  Strict intake and output charting.   Monitor BMP closely.  Hold Lasix.  History of chronic systolic and diastolic heart failure.  Currently getting IV fluids.  We will decrease the rate to 75 mill per hour.  Hold diuretics for now.  Coronary artery disease/CABG.  No active chest pain.  Will resume oral medication when the patient is able to swallow.  VTE Prophylaxis: Lovenox   Code Status: DO NOT INTUBATE  Family Communication:  I spoke with the patient's daughter on the phone and discussed about the clinical condition of the patient.  Agreed on palliative care evaluation due to his repeated admissions and declining status history of recurrent dysphagia and aspiration pneumonia.   Disposition Plan: Nursing facility in 2 to 3 days. pured diet for now until speech and swallow evaluation.   Consultants:  Palliative care- consult placed in  Procedures:  None  Antibiotics:  Vancomycin, Flagyl, cefepime> 01/20/2018  Subjective:  Patient denies any chest pain, palpitation as of breath.  He is alert awake communicative.  Objective: Vitals:   01/16/18 0400 01/16/18 0817  BP: (!) 136/50   Pulse: 85   Resp: 15   Temp:  98.6 F (37 C)  SpO2: 94%     Intake/Output Summary (Last 24 hours) at 01/16/2018 1007 Last data filed at 01/16/2018 0600 Gross per 24 hour  Intake 2967.14 ml  Output 300 ml  Net 2667.14 ml   Filed Weights   01/18/2018 2125 01/16/18 0500  Weight: 55.8 kg 55.5 kg   Physical Examination: General exam: Appears calm and comfortable ,Not in distress, thinly built.  Nasal cannula. HEENT:PERRL,Oral mucosa moist Respiratory system: Diminished breath sounds bilaterally.  Crackles noted  coarse breath sounds. Cardiovascular system: S1 & S2 heard, RRR.  Gastrointestinal system: Abdomen is nondistended, soft and nontender. No organomegaly or masses felt. Normal bowel sounds heard. Central nervous system: Alert awake and communicative.  Confused.  No focal neurological deficits. Extremities: No edema, no  clubbing ,no cyanosis, distal peripheral pulses palpable. Skin: No rashes, lesions or ulcers,no icterus ,no pallor MSK: Normal muscle bulk,tone ,power   Data Review: I have personally reviewed the following laboratory data and studies,  CBC: Recent Labs  Lab 09-Feb-2018 1717 01/16/18 0320  WBC 10.6* 8.4  NEUTROABS 8.7* 7.2  HGB 12.3* 12.0*  HCT 40.5 39.6  MCV 99.5 100.3*  PLT 111* 107*   Basic Metabolic Panel: Recent Labs  Lab 02/09/2018 1717 01/16/18 0320  NA 147* 145  K 4.6 3.7  CL 110 113*  CO2 29 23  GLUCOSE 106* 93  BUN 38* 31*  CREATININE 1.72* 1.11  CALCIUM 7.5* 7.6*   Liver Function Tests: Recent Labs  Lab 2018/02/09 1717 01/16/18 0320  AST 83* 77*  ALT 8 22  ALKPHOS 112 100  BILITOT 0.8 1.2  PROT 6.5 5.9*  ALBUMIN 2.5* 2.3*   No results for input(s): LIPASE, AMYLASE in the last 168 hours. Recent Labs  Lab 2018/02/09 2116  AMMONIA 41*   Cardiac Enzymes: Recent Labs  Lab 01/16/18 0402  TROPONINI <0.03   BNP (last 3 results) No results for input(s): BNP in the last 8760 hours.  ProBNP (last 3 results) No results for input(s): PROBNP in the last 8760 hours.  CBG: No results for input(s): GLUCAP in the last 168 hours. Recent Results (from the past 240 hour(s))  Blood Culture (routine x 2)     Status: None (Preliminary result)   Collection Time: 02-09-18  4:46 PM  Result Value Ref Range Status   Specimen Description   Final    BLOOD RIGHT FOREARM Performed at [facility], [address] [place], [place] [address]    Special Requests   Final    BOTTLES DRAWN AEROBIC AND ANAEROBIC Blood Culture results may not be optimal due to an inadequate volume of blood received in culture bottles Performed at [facility], [address], [place], [place] [address]    Culture   Final    NO GROWTH < 12 HOURS Performed at [facility], [address], [place], [place] [address]    Report Status PENDING   Incomplete  Blood Culture (routine x 2)     Status: None (Preliminary result)   Collection Time: 02/09/2018  5:17 PM  Result Value Ref Range Status   Specimen Description   Final    BLOOD LEFT ANTECUBITAL Performed at [facility], [address] [place], [place] [address]    Special Requests   Final    BOTTLES DRAWN AEROBIC AND ANAEROBIC Blood Culture adequate volume Performed at [facility], [address] [place], [place] [address]    Culture   Final    NO GROWTH < 12 HOURS Performed at [facility], [address] [place], [place] [address]    Report Status PENDING  Incomplete  MRSA PCR Screening     Status: None   Collection Time: 2018-02-09  9:42 PM  Result Value Ref Range Status   MRSA by PCR NEGATIVE NEGATIVE Final    Comment:        The GeneXpert MRSA Assay (FDA approved for NASAL specimens only), is one component  of a comprehensive MRSA colonization surveillance program. It is not intended to diagnose MRSA infection nor to guide or monitor treatment for MRSA infections. Performed at [facility], [address] [place], [place] [id number]      Studies: Ct Head Wo Contrast  Result Date: 01/16/2018 CLINICAL DATA:  84 year old with altered level of consciousness. EXAM: CT HEAD WITHOUT CONTRAST TECHNIQUE: Contiguous axial images were obtained from the base of the skull through the vertex without intravenous contrast. COMPARISON:  02/18/2017 FINDINGS: Brain: No evidence for acute hemorrhage, mass lesion, midline shift, hydrocephalus or large infarct. Stable cerebral atrophy. Low-density in the periventricular white matter is suggestive for chronic changes. Vascular: No hyperdense vessel or unexpected calcification. Skull: Normal. Negative for fracture or focal lesion. Sinuses/Orbits: Chronic mucosal thickening and calcifications in the right maxillary sinus. Mild mucosal thickening in the ethmoid air cells.  Other: None IMPRESSION: 1. No acute intracranial abnormality. 2. Stable atrophy and evidence for chronic small vessel ischemic changes. 3. Chronic right maxillary sinus disease. Electronically Signed   By: Richarda OverlieAdam  Henn M.D.   On: 01/16/2018 09:07   Dg Chest Port 1 View  Result Date: 01/17/2018 CLINICAL DATA:  Altered mental status, shortness of breath EXAM: PORTABLE CHEST 1 VIEW COMPARISON:  10/07/2017 FINDINGS: Multifocal patchy opacities in the right upper lobe and bilateral lower lobes, suspicious for pneumonia, right lower lobe predominant. No pleural effusion or pneumothorax. Postsurgical changes in the left lung apex. The heart is normal in size. Postsurgical changes related to prior CABG. Thoracic aortic atherosclerosis. IMPRESSION: Multifocal patchy opacities, suspicious for pneumonia, right lower lobe predominant. Electronically Signed   By: Charline BillsSriyesh  Krishnan M.D.   On: 01/09/2018 17:45    Scheduled Meds: . chlorhexidine  15 mL Mouth Rinse BID  . enoxaparin (LOVENOX) injection  30 mg Subcutaneous Q24H  . [START ON 01/17/2018] fentaNYL  25 mcg Transdermal Q72H  . mouth rinse  15 mL Mouth Rinse q12n4p   Continuous Infusions: . sodium chloride 100 mL/hr at 01/24/2018 2201  . ceFEPime (MAXIPIME) IV    . metronidazole 500 mg (01/16/18 0910)  . [START ON 01/17/2018] vancomycin      Time spent: 25 minutes. More than 50% of that time was spent in counseling and/or coordination of care.  Myrtha Tonkovich  [facility] Pager [phone]  If 7PM-7AM, please contact night-coverage at [email or web address], password [email or web address] 01/16/2018, 10:07 AM

## 2018-01-16 NOTE — Progress Notes (Signed)
Note order cancelled as pt now full comfort care per notes that indicate MD discussed with daughter.  Thank you.  Please reconsult if desire/indicated.    Donavan Burnetamara Kentrail Shew, MS [facility] SLP Acute Rehab Services Pager [phone] Office [phone]

## 2018-01-16 NOTE — Evaluation (Signed)
Clinical/Bedside Swallow Evaluation Patient Details  Name: Jack Carter MRN: [id number] Date of Birth: 06/20/1933  Today's Date: 01/16/2018 Time: SLP Start Time (ACUTE ONLY): 1210 SLP Stop Time (ACUTE ONLY): 1248 SLP Time Calculation (min) (ACUTE ONLY): 38 min  Past Medical History:  Past Medical History:  Diagnosis Date  . Cellulitis   . Coronary artery disease    CABG  . Essential hypertension   . Heart attack (HCC)   . Hyperlipidemia   . Hypertension   . Parkinson's disease (HCC)   . Restless leg syndrome    Past Surgical History:  Past Surgical History:  Procedure Laterality Date  . APPENDECTOMY    . CARDIAC SURGERY  2000  . CARPAL TUNNEL RELEASE Left   . CORONARY ARTERY BYPASS GRAFT    . IR GASTROSTOMY TUBE MOD SED  12/10/2016  . IR GASTROSTOMY TUBE REMOVAL  04/20/2017  . STOMACH SURGERY  2003   intestinal fissure   HPI:  84 yo [facility], found to have patchy opacities at right lower lobe - likely pna.  Pt PMH + for Parkinsons disease, h/o dysphagia - PEG placed in past removed 04/2017, dehydration 10/2017 also with pna.  CT head negative.  Swallow evaluation ordered.  Per review of chart, pt on puree/nectar diet at facility.  Question cognitive deficits as pt is stating he "needs to go mow".     Assessment / Plan / Recommendation Clinical Impression  Jack Carter is known to this SLP from most recent MBS completed 03/2017 where he presented with moderately severe oropharyngeal dysphagia.  (three upper front teeth are loose - )  Currently his baseline dysphagia is severely exacerbated - He is only inconsistently initiating a swallow despite max verbal/visual cues, dry spoon stimulation and counting in hopes to faciliate initiation.  If pt does swallow, delay up to 40 seconds noted followed by delayed excessive coughing.  Pt orally holding applesauce without triggering a swallow.  SLP eventually suctioned out applesauce.   SLP questions impact of pt not  receiving his Parkinsons medicine on swallowing but SLP and RN attempted to give him medication crushed with nectar liquids without success.    Question if another route could be used to provide parkinsons medications in hopes of improving motor initiation.  Pt's gross weakness *dysarthria, delayed swallow and weak cough place him at very high risk of aspiration pna.    Recommend consider means to receive Parkinsons medication and reevaluate.  Note pt for palliative referral and thus if deemed comfort - would allow po as pt desires as he was tearful after MBS January 2019 when it was not recommended his diet be advanced.    Will follow up to help in managing pt's dysphagia/pt and family education.    Recommend NPO, oral care and single small ice chips only as tolerate pending plan.  Educated pt to plan but he is confused stating he "needs to go mow".   SLP Visit Diagnosis: Dysphagia, oropharyngeal phase (R13.12);Dysphagia, pharyngoesophageal phase (R13.14)    Aspiration Risk  Severe aspiration risk;Risk for inadequate nutrition/hydration    Diet Recommendation NPO;Ice chips PRN after oral care(? means to obtain Parkinsons medication)   Medication Administration: Via alternative means    Other  Recommendations     Follow up Recommendations (tbd)      Frequency and Duration min 1 x/week          Prognosis Barriers to Reach Goals: Cognitive deficits;Motivation;Time post onset;Severity of deficits;Medication(not having medication)  Swallow Study   General Date of Onset: 01/16/18 HPI: 84 yo male [facility] with AMS, found to have patchy opacities at right lower lobe - likely pna.  Pt PMH + for Parkinsons disease, h/o dysphagia - PEG placed in past removed 04/2017, dehydration 10/2017 also with pna.  CT head negative.  Swallow evaluation ordered.  Per review of chart, pt on puree/nectar diet at facility.  Question cognitive deficits as pt is stating he "needs to go mow".   Type of  Study: Bedside Swallow Evaluation Previous Swallow Assessment: multiple mbs studies - last op showed severely delayed swallow, audible aspiration with weak nonproductive cough, rec puree/nectar, tsps of thin Diet Prior to this Study: Dysphagia 1 (puree);Thin liquids Temperature Spikes Noted: No Respiratory Status: Nasal cannula History of Recent Intubation: No Behavior/Cognition: Alert;Requires cueing(pt keeps saying he needs to go mow) Oral Cavity Assessment: Other (comment);Dry Oral Cavity - Dentition: Poor condition;Missing dentition(three upper front teeth are loose - ) Vision: Impaired for self-feeding Self-Feeding Abilities: Total assist Patient Positioning: Upright in bed Baseline Vocal Quality: Other (comment);Breathy;Suspected CN X (Vagus) involvement Volitional Cough: Weak Volitional Swallow: Unable to elicit    Oral/Motor/Sensory Function Overall Oral Motor/Sensory Function: Severe impairment(masked facies, weak cough/voice, dysarthric)   Ice Chips Ice chips: Impaired Presentation: Spoon Oral Phase Impairments: Reduced labial seal;Poor awareness of bolus;Reduced lingual movement/coordination Oral Phase Functional Implications: Oral holding;Other (comment) Pharyngeal Phase Impairments: Suspected delayed Swallow Other Comments: pt not closing oral cavity to manipulate ice chip-allowing it to melt with excessively delayed swallow   Thin Liquid Thin Liquid: Not tested    Nectar Thick Nectar Thick Liquid: Impaired Presentation: Cup;Spoon;Straw Oral Phase Impairments: Poor awareness of bolus;Reduced labial seal;Reduced lingual movement/coordination Oral phase functional implications: Prolonged oral transit;Oral holding Pharyngeal Phase Impairments: Suspected delayed Swallow;Cough - Delayed Other Comments: pt's swallow WHEN elicited is delayed up to 40 seconds despite cues to swallow, counting to help initiate, etc, tried to get pt his Parkinson medicine with RN *crushed in nectar  via tsp * however he continues to hold and powdered medicine adhered to tongue    Honey Thick Honey Thick Liquid: Not tested   Puree Puree: Impaired Presentation: Spoon Oral Phase Impairments: Reduced labial seal;Reduced lingual movement/coordination Oral Phase Functional Implications: Oral holding;Oral residue;Prolonged oral transit Other Comments: SlP removed applesauce from pt's oral cavity using suction after he did not elicit swallow   Solid     Solid: Not tested      Chales Abrahams 01/16/2018,1:08 PM  Donavan Burnet, MS [facility] SLP Acute Rehab Services Pager [phone] Office [phone]

## 2018-01-16 NOTE — Progress Notes (Signed)
Pharmacy Antibiotic Note  Midge MiniumCharles L Marcou is a 84 y.o. male admitted on 04-14-17 with pneumonia.  Pharmacy has been consulted for vancomycin dosing.  Patient admitted from nursing home with decreased level of consciousness. Pharmacy consulted to dose vancomycin for PNA. Pt received vancomycin 1000 mg and cefepime 2 g in ED.  Today, 01/16/18  SCr improved 1.11, CrCl ~40 mL/min  Lactate 2.3 > 1.4  PCT < 0.1  Afebrile  WBC imporved 8.4  Plan:  Adjust Vancomycin to 750mg  IV q24h for estimated AUC 442  Check levels at steady state, goal AUC 400-500  Adjust Cefepime 1 g IV q12h   Flagyl per MD  Monitor renal function, culture data, clinical course  Weight: 122 lb 5.7 oz (55.5 kg)  Temp (24hrs), Avg:98.5 F (36.9 C), Min:98.2 F (36.8 C), Max:99.5 F (37.5 C)  Recent Labs  Lab June 18, 2017 1717 June 18, 2017 1723 June 18, 2017 1911 June 18, 2017 1942 01/16/18 0320 01/16/18 0611 01/16/18 0945  WBC 10.6*  --   --   --  8.4  --   --   CREATININE 1.72*  --   --   --  1.11  --   --   LATICACIDVEN  --  3.22* 2.19* 2.3*  --  1.4 1.4    Estimated Creatinine Clearance: 40.3 mL/min (by C-G formula based on SCr of 1.11 mg/dL).    No Known Allergies  Antimicrobials this admission:  11/15 Vanc >> 11/15 Cefepime >> 11/15 Flagyl >>  Dose adjustments this admission:  11/16 increase cefepime from 1g q24h to q12h; vancomycin from 750mg  q48h to q24h for improved renal function  Microbiology results:  11/15 BCx: 11/15 MRSA PCR: neg 11/16 Influenza A/B: neg/neg 11/16 UCx:  Thank you for allowing pharmacy to be a part of this patient's care.  Loralee PacasErin Chavis Tessler, PharmD, BCPS Pager: [phone] 01/16/2018 12:45 PM

## 2018-01-16 NOTE — Significant Event (Addendum)
I had a prolonged discussion with the patient's daughter on the phone about the clinical condition of the patient.  Patient's daughter stated that the patient had been declining over the last several months in  terms of physical and functional capacity.  The patient had expressed that he would not want to be resuscitated or put on a machine.  Given his deteriorating condition, including recurrent pneumonia dysphagia, worsening Parkinson's disease, debility and deconditioning, patient's daughter has decided to proceed with comfort care for the patient.    Goals of comfort care was discussed in detail with the patient's daughter, who is the only living children and the decision maker for the patient.  I have informed that the comfort care will foucus on providing comfort only and she has understood the scope of treatment.  CODE STATUS will be changed to DNR.  I also spoke with the patient's nurse about the change in the level of care.

## 2018-01-17 DIAGNOSIS — L89201 Pressure ulcer of unspecified hip, stage 1: Secondary | ICD-10-CM

## 2018-01-17 LAB — URINE CULTURE: Culture: NO GROWTH

## 2018-01-17 NOTE — Progress Notes (Signed)
Progress Note    Jack Carter  ZOX:[id number] DOB: 04-23-1933  DOA: 19-Jan-2018 PCP: [name], MD    Brief Narrative:   Chief complaint: Follow-up acute onset confusion  Medical records reviewed and are as summarized below:  Jack Carter is an 84 y.o. male with a PMH of CAD status post CABG, Parkinson's disease, chronic dysphasia, history of ESBL UTI with hospital admission 3 months ago who was admitted 01-19-2018 with worsening confusion/decreased verbalization associated with hypotension and fever to 101 F.  Assessment/Plan:   Principal Problems:   Acute encephalopathy due to aspiration/healthcare associated pneumonia Placed on empiric antibiotics.  With his known history of chronic dysphasia, ongoing severe debility, he has now been transitioned to comfort care, and on appropriate comfort medications.  Continue dysphagia 1 diet and comfort feeds.  We will have the palliative care team evaluate him for consideration of residential hospice placement.  May need better symptom control as he continues to have outbursts of anxiety/anger at times.  Lengthy discussion held with the patient's daughter who is on board with palliative care.  Active Problems:   Parkinsonism (HCC) Appears to be fairly advanced.  Sinemet discontinued.    Essential hypertension Metoprolol discontinued.    Chronic combined systolic and diastolic CHF (congestive heart failure) (HCC) Appears compensated.    Pressure injury of skin: Stage I left hip, stage I right hip, stage I sacral pressure injuries Skin care per nursing protocols.  Body mass index is 18.07 kg/m.   Family Communication/Anticipated D/C date and plan/Code Status   DVT prophylaxis: None, comfort care. Code Status: DNR. Family Communication: No family at the bedside.  Disposition Plan: ? Residential hospice.    Medical Consultants:    Palliative Care   Anti-Infectives:    Cefepime/vancomycin x1  day.  Subjective:   Slow to respond, but reports some nausea and hunger.  No frank shortness of breath but does endorse an occasional cough.  Objective:    Vitals:   01/17/18 0100 01/17/18 0200 01/17/18 0307 01/17/18 0601  BP: (!) 126/48 (!) 129/53 138/68 134/65  Pulse: 62 66 84 65  Resp: 12 12 15 16   Temp:   98.6 F (37 C) 98.9 F (37.2 C)  TempSrc:   Oral Oral  SpO2: 98% 98% 100% 96%  Weight:        Intake/Output Summary (Last 24 hours) at 01/17/2018 8119 Last data filed at 01/17/2018 1478 Gross per 24 hour  Intake 0 ml  Output 400 ml  Net -400 ml   Filed Weights   01/19/18 2125 01/16/18 0500  Weight: 55.8 kg 55.5 kg    Exam: General: Frail, elderly male minimally verbal. Cardiovascular: Heart sounds show a regular rate, and rhythm. No gallops or rubs. No murmurs. No JVD. Lungs: Decreased breath sounds but clear in the upper lobes, poor effort. Abdomen: Soft, nontender, nondistended with normal active bowel sounds. No masses. No hepatosplenomegaly. Neurological: Awake and oriented 1. Moves all extremities 4 with very diminished strength.  Skin: Warm and dry.  Scattered ecchymosis. Extremities: No clubbing or cyanosis. No edema. Pedal pulses 2+. Psychiatric: Mood and affect are flat.  Insight and judgment are impaired.   Data Reviewed:   I have personally reviewed following labs and imaging studies:  Labs: Labs show the following:   Basic Metabolic Panel: Recent Labs  Lab 2018/01/19 1717 01/16/18 0320  NA 147* 145  K 4.6 3.7  CL 110 113*  CO2 29 23  GLUCOSE 106* 93  BUN 38* 31*  CREATININE 1.72* 1.11  CALCIUM 7.5* 7.6*   GFR Estimated Creatinine Clearance: 40.3 mL/min (by C-G formula based on SCr of 1.11 mg/dL). Liver Function Tests: Recent Labs  Lab 01/23/2018 1717 01/16/18 0320  AST 83* 77*  ALT 8 22  ALKPHOS 112 100  BILITOT 0.8 1.2  PROT 6.5 5.9*  ALBUMIN 2.5* 2.3*    Recent Labs  Lab 01/14/2018 2116  AMMONIA 41*   CBC: Recent  Labs  Lab 01/18/2018 1717 01/16/18 0320  WBC 10.6* 8.4  NEUTROABS 8.7* 7.2  HGB 12.3* 12.0*  HCT 40.5 39.6  MCV 99.5 100.3*  PLT 111* 107*   Cardiac Enzymes: Recent Labs  Lab 01/16/18 0402  TROPONINI <0.03   Sepsis Labs: Recent Labs  Lab 01/08/2018 1717  01/18/2018 1911 01/17/2018 1942 01/16/18 0320 01/16/18 0402 01/16/18 0611 01/16/18 0945  PROCALCITON  --   --   --   --   --  <0.10  --   --   WBC 10.6*  --   --   --  8.4  --   --   --   LATICACIDVEN  --    < > 2.19* 2.3*  --   --  1.4 1.4   < > = values in this interval not displayed.    Microbiology Recent Results (from the past 240 hour(s))  Blood Culture (routine x 2)     Status: None (Preliminary result)   Collection Time: 01/14/2018  4:46 PM  Result Value Ref Range Status   Specimen Description   Final    BLOOD RIGHT FOREARM Performed at [facility], [address] [place], [place] [address]    Special Requests   Final    BOTTLES DRAWN AEROBIC AND ANAEROBIC Blood Culture results may not be optimal due to an inadequate volume of blood received in culture bottles Performed at [facility], [address] [place], [place] [address]    Culture   Final    NO GROWTH < 24 HOURS Performed at [facility], [address] [place], [place] [address]    Report Status PENDING  Incomplete  Blood Culture (routine x 2)     Status: None (Preliminary result)   Collection Time: 01/08/2018  5:17 PM  Result Value Ref Range Status   Specimen Description   Final    BLOOD LEFT ANTECUBITAL Performed at [facility], [address] [place], [place] [address]    Special Requests   Final    BOTTLES DRAWN AEROBIC AND ANAEROBIC Blood Culture adequate volume Performed at [facility], [address] [place], [place] [address]    Culture   Final    NO GROWTH < 24 HOURS Performed at [facility], [address] [place], [place] [address]     Report Status PENDING  Incomplete  MRSA PCR Screening     Status: None   Collection Time: 01/11/2018  9:42 PM  Result Value Ref Range Status   MRSA by PCR NEGATIVE NEGATIVE Final    Comment:        The GeneXpert MRSA Assay (FDA approved for NASAL specimens only), is one component of a comprehensive MRSA colonization surveillance program. It is not intended to diagnose MRSA infection nor to guide or monitor treatment for MRSA infections. Performed at [facility], [address] [place], [place] [address]   Culture, Urine     Status: None  Collection Time: 01/16/18  4:07 AM  Result Value Ref Range Status   Specimen Description   Final    URINE, CLEAN CATCH Performed at [facility], [address] [place], [place] [address]    Special Requests   Final    NONE Performed at [facility], [address], [place], [place] [address]    Culture   Final    NO GROWTH Performed at [facility], [address], [place], [place] [address]    Report Status 01/17/2018 FINAL  Final    Procedures and diagnostic studies:  Ct Head Wo Contrast  Result Date: 01/16/2018 CLINICAL DATA:  84 year old with altered level of consciousness. EXAM: CT HEAD WITHOUT CONTRAST TECHNIQUE: Contiguous axial images were obtained from the base of the skull through the vertex without intravenous contrast. COMPARISON:  02/18/2017 FINDINGS: Brain: No evidence for acute hemorrhage, mass lesion, midline shift, hydrocephalus or large infarct. Stable cerebral atrophy. Low-density in the periventricular white matter is suggestive for chronic changes. Vascular: No hyperdense vessel or unexpected calcification. Skull: Normal. Negative for fracture or focal lesion. Sinuses/Orbits: Chronic mucosal thickening and calcifications in the right maxillary sinus. Mild mucosal thickening in the ethmoid air cells. Other: None IMPRESSION: 1. No acute intracranial  abnormality. 2. Stable atrophy and evidence for chronic small vessel ischemic changes. 3. Chronic right maxillary sinus disease. Electronically Signed   By: Richarda Overlie M.D.   On: 01/16/2018 09:07   Dg Chest Port 1 View  Result Date: 01/22/2018 CLINICAL DATA:  Altered mental status, shortness of breath EXAM: PORTABLE CHEST 1 VIEW COMPARISON:  10/07/2017 FINDINGS: Multifocal patchy opacities in the right upper lobe and bilateral lower lobes, suspicious for pneumonia, right lower lobe predominant. No pleural effusion or pneumothorax. Postsurgical changes in the left lung apex. The heart is normal in size. Postsurgical changes related to prior CABG. Thoracic aortic atherosclerosis. IMPRESSION: Multifocal patchy opacities, suspicious for pneumonia, right lower lobe predominant. Electronically Signed   By: Charline Bills M.D.   On: 01/24/2018 17:45    Medications:   . fentaNYL  25 mcg Transdermal Q72H  . mouth rinse  15 mL Mouth Rinse q12n4p   Continuous Infusions:   LOS: 2 days    Hillery Aldo  [facility] Pager [phone]. If unable to reach me by pager, please call my cell phone at [phone].  *Please refer to [email or web address], password [email or web address] to get updated schedule on who will round on this patient, as hospitalists switch teams weekly. If 7PM-7AM, please contact night-coverage at [email or web address], password [email or web address] for any overnight needs.  01/17/2018, 8:32 AM

## 2018-01-17 NOTE — Progress Notes (Signed)
[place] to [address] [place], report given to HelenaBrianna RN who will resume care.

## 2018-01-17 NOTE — Plan of Care (Signed)
Patient in bed this morning; eyes open; alert; responds to questions but difficult to understand. States he has some pain in legs and feet and he wants something to drink. Will review medications to see what is available and try to make patient comfortable. Will continue to monitor.

## 2018-01-18 DIAGNOSIS — G9340 Encephalopathy, unspecified: Secondary | ICD-10-CM

## 2018-01-18 NOTE — Care Management Important Message (Signed)
Important Message  Patient Details  Name: Jack Carter MRN: [id number] Date of Birth: 03/20/1933   Medicare Important Message Given:  Yes    Caren MacadamFuller, Leetta Hendriks 01/18/2018, 11:27 AMImportant Message  Patient Details  Name: Jack Carter MRN: [id number] Date of Birth: 08/27/1933   Medicare Important Message Given:  Yes    Caren MacadamFuller, Tivis Wherry 01/18/2018, 11:27 AM

## 2018-01-18 NOTE — Progress Notes (Signed)
Palliative:  I met briefly with Jack Carter. He is pleasantly confused. No family at bedside. He aspirated on just a sip of drink and refused any food. I do believe that he would be eligible for hospice facility per review of notes and aspiration and goals for comfort at this time. I will reach out to Jack Carter' daughter tomorrow to confirm goals of comfort and if so proceed with transition to hospice facility.   No charge  Vinie Sill, NP Palliative Medicine Team Pager # [phone] (M-F 8a-5p) Team Phone # [phone] (Nights/Weekends)

## 2018-01-18 NOTE — Progress Notes (Signed)
PROGRESS NOTE  [name] CLASS [id number] DOB: 1933-12-26 DOA: 2018/01/31 PCP: [name], MD  HPI/Recap of past 24 hours: Jack Carter is an 84 year old male with past medical history of CAD status post CABG, Parkinson's disease, chronic dysphasia, history of ESBL UTI with hospital admission 3 months ago, was admitted on 2018/01/31 with worsening confusion/decreased verbalization associated with hypotension and a fever of 101 F.  Patient admitted for further management.   Today, patient was resting comfortably in bed, not in any distress.   Assessment/Plan: Principal Problem:   Acute encephalopathy Active Problems:   Parkinsonism (HCC)   Essential hypertension   Chronic combined systolic and diastolic CHF (congestive heart failure) (HCC)   HCAP (healthcare-associated pneumonia)   Pressure injury of skin  Acute metabolic encephalopathy 2/2 aspiration/HCAP Currently afebrile, not in any respiratory distress Transitioned to comfort care Continue comfort feeds Palliative team consulted for consideration for possible residential hospice placement  Parkinsonism Advanced, comfort care Sinemet discontinued  Essential hypertension Stable Comfort care  Chronic combined systolic and diastolic HF Appears compensated Comfort care  Multiple pressure ulcers including stage I left hip, stage I right hip, stage I sacral pressure injuries Skin care per nursing protocols     Code Status: DNR  Family Communication: None at bedside  Disposition Plan: Plan for possible residential hospice as per palliative   Consultants:  Palliative care  Procedures:  None  Antimicrobials:  None  DVT prophylaxis: None   Objective: Vitals:   01/17/18 0307 01/17/18 0601 01/18/18 0635 01/18/18 1531  BP: 138/68 134/65 (!) 154/66 (!) 153/65  Pulse: 84 65 71 74  Resp: 15 16  19   Temp: 98.6 F (37 C) 98.9 F (37.2 C)  98.6 F (37 C)  TempSrc: Oral Oral  Oral    SpO2: 100% 96% 95% 97%  Weight:        Intake/Output Summary (Last 24 hours) at 01/18/2018 1615 Last data filed at 01/18/2018 0600 Gross per 24 hour  Intake 190 ml  Output 400 ml  Net -210 ml   Filed Weights   01-31-18 2125 01/16/18 0500  Weight: 55.8 kg 55.5 kg    Exam:   General: NAD  Cardiovascular: S1, S2 present  Respiratory: Decreased breath sounds bilaterally, poor respiratory effort  Abdomen: Soft, nontender, nondistended, bowel sounds present  Musculoskeletal: No pedal edema bilaterally  Skin: Scattered ecchymosis, otherwise normal  Psychiatry: Unable to assess   Data Reviewed: CBC: Recent Labs  Lab 01-31-2018 1717 01/16/18 0320  WBC 10.6* 8.4  NEUTROABS 8.7* 7.2  HGB 12.3* 12.0*  HCT 40.5 39.6  MCV 99.5 100.3*  PLT 111* 107*   Basic Metabolic Panel: Recent Labs  Lab Jan 31, 2018 1717 01/16/18 0320  NA 147* 145  K 4.6 3.7  CL 110 113*  CO2 29 23  GLUCOSE 106* 93  BUN 38* 31*  CREATININE 1.72* 1.11  CALCIUM 7.5* 7.6*   GFR: Estimated Creatinine Clearance: 40.3 mL/min (by C-G formula based on SCr of 1.11 mg/dL). Liver Function Tests: Recent Labs  Lab 01/31/18 1717 01/16/18 0320  AST 83* 77*  ALT 8 22  ALKPHOS 112 100  BILITOT 0.8 1.2  PROT 6.5 5.9*  ALBUMIN 2.5* 2.3*   No results for input(s): LIPASE, AMYLASE in the last 168 hours. Recent Labs  Lab 2018/01/31 2116  AMMONIA 41*   Coagulation Profile: No results for input(s): INR, PROTIME in the last 168 hours. Cardiac Enzymes: Recent Labs  Lab 01/16/18 0402  TROPONINI <0.03   BNP (last  3 results) No results for input(s): PROBNP in the last 8760 hours. HbA1C: No results for input(s): HGBA1C in the last 72 hours. CBG: No results for input(s): GLUCAP in the last 168 hours. Lipid Profile: No results for input(s): CHOL, HDL, LDLCALC, TRIG, CHOLHDL, LDLDIRECT in the last 72 hours. Thyroid Function Tests: No results for input(s): TSH, T4TOTAL, FREET4, T3FREE, THYROIDAB in the  last 72 hours. Anemia Panel: No results for input(s): VITAMINB12, FOLATE, FERRITIN, TIBC, IRON, RETICCTPCT in the last 72 hours. Urine analysis:    Component Value Date/Time   COLORURINE AMBER (A) 01/22/2018 1614   APPEARANCEUR HAZY (A) 01/07/2018 1614   LABSPEC 1.027 01/12/2018 1614   PHURINE 5.0 01/20/2018 1614   GLUCOSEU NEGATIVE 01/11/2018 1614   HGBUR MODERATE (A) 01/19/2018 1614   BILIRUBINUR NEGATIVE 01/10/2018 1614   KETONESUR 5 (A) 01/20/2018 1614   PROTEINUR 30 (A) 01/08/2018 1614   NITRITE NEGATIVE 01/08/2018 1614   LEUKOCYTESUR NEGATIVE 01/04/2018 1614   Sepsis Labs: @LABRCNTIP (procalcitonin:4,lacticidven:4)  ) Recent Results (from the past 240 hour(s))  Blood Culture (routine x 2)     Status: None (Preliminary result)   Collection Time: 01/09/2018  4:46 PM  Result Value Ref Range Status   Specimen Description   Final    BLOOD RIGHT FOREARM Performed at [facility], [address], [place], [place] [address]    Special Requests   Final    BOTTLES DRAWN AEROBIC AND ANAEROBIC Blood Culture results may not be optimal due to an inadequate volume of blood received in culture bottles Performed at [facility], [address], [place], [place] [address]    Culture   Final    NO GROWTH 3 DAYS Performed at [facility], [address], [place], [place] [address]    Report Status PENDING  Incomplete  Blood Culture (routine x 2)     Status: None (Preliminary result)   Collection Time: 01/04/2018  5:17 PM  Result Value Ref Range Status   Specimen Description   Final    BLOOD LEFT ANTECUBITAL Performed at [facility], [address], [place], [place] [address]    Special Requests   Final    BOTTLES DRAWN AEROBIC AND ANAEROBIC Blood Culture adequate volume Performed at [facility], [address], [place], [place] [address]    Culture   Final    NO GROWTH 3 DAYS Performed at [facility], [address] [place], [place] [address]    Report Status PENDING  Incomplete  MRSA PCR Screening     Status: None   Collection Time: 01/11/2018  9:42 PM  Result Value Ref Range Status   MRSA by PCR NEGATIVE NEGATIVE Final    Comment:        The GeneXpert MRSA Assay (FDA approved for NASAL specimens only), is one component of a comprehensive MRSA colonization surveillance program. It is not intended to diagnose MRSA infection nor to guide or monitor treatment for MRSA infections. Performed at [facility], [address], [place], [place] [address]   Culture, Urine     Status: None   Collection Time: 01/16/18  4:07 AM  Result Value Ref Range Status   Specimen Description   Final    URINE, CLEAN CATCH Performed at [facility], [address], [place], [place] [address]    Special Requests   Final    NONE Performed at [facility], [address] [place], [place] [address]    Culture   Final    NO GROWTH Performed at [facility], [address] [place], [place] [address]    Report Status 01/17/2018 FINAL  Final      Studies: No results found.  Scheduled Meds: . fentaNYL  25 mcg Transdermal Q72H  . mouth rinse  15 mL Mouth Rinse q12n4p    Continuous Infusions:   LOS: 3 days     Briant CedarNkeiruka J Wilver Tignor, MD [facility]   If 7PM-7AM, please contact night-coverage [email or web address] 01/18/2018, 4:15 PM

## 2018-01-19 ENCOUNTER — Other Ambulatory Visit: Payer: Self-pay

## 2018-01-19 DIAGNOSIS — R1310 Dysphagia, unspecified: Secondary | ICD-10-CM

## 2018-01-19 DIAGNOSIS — G20 Parkinson's disease: Secondary | ICD-10-CM

## 2018-01-19 DIAGNOSIS — Z7189 Other specified counseling: Secondary | ICD-10-CM

## 2018-01-19 DIAGNOSIS — Z515 Encounter for palliative care: Secondary | ICD-10-CM

## 2018-01-19 MED ORDER — GLYCOPYRROLATE 0.2 MG/ML IJ SOLN
0.2000 mg | INTRAMUSCULAR | Status: DC | PRN
  Filled 2018-01-19: qty 1

## 2018-01-19 MED ORDER — MORPHINE SULFATE (PF) 2 MG/ML IV SOLN
2.0000 mg | INTRAVENOUS | Status: DC | PRN
  Administered 2018-01-20 – 2018-01-21 (×5): 2 mg via INTRAVENOUS
  Filled 2018-01-19 (×5): qty 1

## 2018-01-19 MED ORDER — MORPHINE SULFATE (CONCENTRATE) 10 MG/0.5ML PO SOLN
5.0000 mg | ORAL | Status: DC | PRN
  Administered 2018-01-19: 5 mg via SUBLINGUAL
  Filled 2018-01-19: qty 0.5

## 2018-01-19 NOTE — Consult Note (Signed)
Consultation Note Date: 01/19/2018   Patient Name: Jack Carter  DOB: 1933-10-29  MRN: [id number]  Age / Sex: 84 y.o., male  PCP: Merlene Laughter, MD Referring Physician: Alma Friendly, MD  Reason for Consultation: Establishing goals of care and Hospice Evaluation  HPI/Patient Profile: 84 y.o. male  with past medical history of CAD s/p CABG, Parkinson's disease, dysphagia, HTN, HLD, restless leg syndrome admitted on 01/03/2018 from SNF with altered mental status and aspiration pneumonia/HCAP. Transition to comfort care.   Clinical Assessment and Goals of Care: I met today at Jack Carter' bedside. He is resting comfortably. He is pleasantly confused. Will take sips but refusing food. Aspiration risk remains extremely high. RN reports episode of agitation and anxiety with combativeness last night.   I spoke with Jack Carter' daughter, Jack Carter, who tells me that her father was been declining with Parkinson's for some time. She shares that her father has been intubated last year and has had a feeding tube in the past. She shares that she has struggled to uphold his desire "to keep him alive." We discussed that life prolonging measures without QOL would not be in his best interest. We discussed that he has poor QOL and that she has provided him with aggressive care with honoring his goal to prolong his life. I support her decision to provide him with comfort and dignity now at the end of his life. We discussed hospice facility and daughter is interested in [facility] placement. Discussed poor prognosis < 2 weeks. Emotional support provided.   Primary Decision Maker NEXT OF KIN daughter Jack Carter    SUMMARY OF RECOMMENDATIONS   - DNR - Full comfort care - Transition to hospice facility  Code Status/Advance Care Planning:  DNR   Symptom Management:   Pain/SOB:  Continue fentanyl patch 25 mcg/hr. Morphine SL or IV every 2 hours prn.   Agitation: Ativan 1 mg IV every 2 hours prn.   Robinul prn secretions.   Palliative Prophylaxis:   Aspiration, Bowel Regimen, Delirium Protocol and Frequent Pain Assessment  Additional Recommendations (Limitations, Scope, Preferences):  Full Comfort Care  Psycho-social/Spiritual:   Desire for further Chaplaincy support:no  Additional Recommendations: Education on Hospice and Grief/Bereavement Support  Prognosis:   < 2 weeks  Discharge Planning: Hospice facility      Primary Diagnoses: Present on Admission: . HCAP (healthcare-associated pneumonia) . Parkinsonism ([place]) . Essential hypertension . Chronic combined systolic and diastolic CHF (congestive heart failure) ([place]) . Acute encephalopathy   I have reviewed the medical record, interviewed the patient and family, and examined the patient. The following aspects are pertinent.  Past Medical History:  Diagnosis Date  . Cellulitis   . Coronary artery disease    CABG  . Essential hypertension   . Heart attack ([place])   . Hyperlipidemia   . Hypertension   . Parkinson's disease ([place])   . Restless leg syndrome    Social History   Socioeconomic History  . Marital status: Married  Spouse name: Not on file  . Number of children: Not on file  . Years of education: Not on file  . Highest education level: Not on file  Occupational History  . Not on file  Social Needs  . Financial resource strain: Not on file  . Food insecurity:    Worry: Not on file    Inability: Not on file  . Transportation needs:    Medical: Not on file    Non-medical: Not on file  Tobacco Use  . Smoking status: Former Smoker  . Smokeless tobacco: Never Used  Substance and Sexual Activity  . Alcohol use: No  . Drug use: No  . Sexual activity: Never  Lifestyle  . Physical activity:    Days per week: Not on file    Minutes per session: Not on file  . Stress:  Not on file  Relationships  . Social connections:    Talks on phone: Not on file    Gets together: Not on file    Attends religious service: Not on file    Active member of club or organization: Not on file    Attends meetings of clubs or organizations: Not on file    Relationship status: Not on file  Other Topics Concern  . Not on file  Social History Narrative   ** Merged History Encounter **       Family History  Problem Relation Age of Onset  . Heart disease Mother   . Cancer Mother   . Pneumonia Father    Scheduled Meds: . fentaNYL  25 mcg Transdermal Q72H  . mouth rinse  15 mL Mouth Rinse q12n4p   Continuous Infusions: PRN Meds:.bisacodyl, glycopyrrolate, LORazepam, morphine injection, ondansetron **OR** ondansetron (ZOFRAN) IV No Known Allergies Review of Systems  Unable to perform ROS   Physical Exam  Constitutional: He appears well-developed. He appears lethargic.  HENT:  Head: Normocephalic and atraumatic.  Cardiovascular: Normal rate.  Pulmonary/Chest: No accessory muscle usage. No tachypnea. No respiratory distress. He has decreased breath sounds.  Abdominal: Soft. Normal appearance.  Neurological: He appears lethargic. He is disoriented.  Nursing note and vitals reviewed.   Vital Signs: BP (!) 165/64 (BP Location: Right Arm)   Pulse 64   Temp 98.7 F (37.1 C) (Oral)   Resp 18   Wt 55.5 kg   SpO2 96%   BMI 18.07 kg/m  Pain Scale: Faces   Pain Score: Asleep   SpO2: SpO2: 96 % O2 Device:SpO2: 96 % O2 Flow Rate: .O2 Flow Rate (L/min): 4 L/min  IO: Intake/output summary: No intake or output data in the 24 hours ending 01/19/18 1003  LBM: Last BM Date: 01/19/18 Baseline Weight: Weight: 55.8 kg Most recent weight: Weight: 55.5 kg     Palliative Assessment/Data: 20%     Time In: 0930 Time Out: 1030 Time Total: 60 min Greater than 50%  of this time was spent counseling and coordinating care related to the above assessment and  plan.  Signed by:  , NP Palliative Medicine Team Pager # [phone] (M-F 8a-5p) Team Phone # [phone] (Nights/Weekends)   

## 2018-01-19 NOTE — Progress Notes (Signed)
[facility] ° °Received request from CSW for family interest in [facility] with request to transfer when a bed is available.  Chart reviewed, received report from staff.  Met with patient at bedside, he was non-verbal, but smiled.  Spoke with daughter Jack Carter, confirmed interest and explained services.  Unfortunately [facility] is not able to offer a bed today.  Explained to Jack Carter that [facility] and CSW will continue to follow and update should a room become available. ° °Please call with any hospice related questions ° °Jack Carter BSN, RN  °[facility] Hospital Liaison (listed in AMION) °[phone] °

## 2018-01-19 NOTE — Progress Notes (Signed)
CSW aware of patients family requesting referral for residential hospice- CSW spoke with Victorino DikeJennifer from [facility] and she is going to speak with patients family hospice plans.   Stacy GardnerErin Liset Mcmonigle, LCSW Clinical Social Worker  [facility]  [phone]

## 2018-01-19 NOTE — Progress Notes (Signed)
Nutrition Brief Note  Patient identified on the Malnutrition Screening Tool (MST) Report  Wt Readings from Last 15 Encounters:  01/16/18 55.5 kg  10/08/17 73.3 kg  07/16/17 66.7 kg  02/23/17 63.8 kg  02/18/17 68 kg  11/18/16 79.1 kg  10/30/16 78.9 kg    Body mass index is 18.07 kg/m. Patient meets criteria for underweight based on current BMI. Stage 1 pressure injuries to bilateral hips and sacrum. Patient is a/o to self only.   Palliative Care following and plans to talk with family today more about GOC and about transition to residential hospice. Note from yesterday evening states that patient aspirates on just one sip of liquid.   Current diet order is Dysphagia 1, thin liquids. Labs and medications reviewed.   No nutrition interventions warranted at this time. If nutrition issues arise, please consult RD.     Trenton GammonJessica Dalante Minus, MS, RD, LDN, [facility] Inpatient Clinical Dietitian Pager # [phone] After hours/weekend pager # [phone]

## 2018-01-19 NOTE — Progress Notes (Signed)
PROGRESS NOTE  Jack MiniumCharles L Torrez JXB:[id number] DOB: 02/22/1934 DOA: 05/01/17 PCP: Angela Coxasanayaka, Gayani Y, MD  HPI/Recap of past 24 hours: Jack RangerCharles Carter is an 84 year old male with past medical history of CAD status post CABG, Parkinson's disease, chronic dysphasia, history of ESBL UTI with hospital admission 3 months ago, was admitted on 05/01/17 with worsening confusion/decreased verbalization associated with hypotension and a fever of 101 F.  Patient admitted for further management.   Today, saw patient laying in bed. Unable to answer questions appropriately. Awake, alert, oriented to self.  Assessment/Plan: Principal Problem:   Acute encephalopathy Active Problems:   Parkinsonism (HCC)   Essential hypertension   Chronic combined systolic and diastolic CHF (congestive heart failure) (H[facility])   HCAP (healthcare-associated pneumonia)   Pressure injury of skin   Dysphagia  Acute metabolic encephalopathy 2/2 aspiration/HCAP Currently afebrile, not in any respiratory distress Transitioned to comfort care Continue comfort feeds Palliative team consulted, plan for residential hospice placement (daughter in agreement)  Parkinsonism Advanced, comfort care Sinemet discontinued  Essential hypertension Stable Comfort care  Chronic combined systolic and diastolic HF Appears compensated Comfort care  Multiple pressure ulcers including stage I left hip, stage I right hip, stage I sacral pressure injuries Skin care per nursing protocols     Code Status: DNR  Family Communication: None at bedside  [facility]   Consultants:  Palliative care  Procedures:  None  Antimicrobials:  None  DVT prophylaxis: None   Objective: Vitals:   01/17/18 0601 01/18/18 0635 01/18/18 1531 01/18/18 2251  BP: 134/65 (!) 154/66 (!) 153/65 (!) 165/64  Pulse: 65 71 74 64  Resp: 16  19 18   Temp: 98.9 F (37.2 C)  98.6 F (37 C) 98.7 F (37.1  C)  TempSrc: Oral  Oral Oral  SpO2: 96% 95% 97% 96%  Weight:        Intake/Output Summary (Last 24 hours) at 01/19/2018 1651 Last data filed at 01/19/2018 1500 Gross per 24 hour  Intake 0 ml  Output 250 ml  Net -250 ml   Filed Weights   03-14-2017 2125 01/16/18 0500  Weight: 55.8 kg 55.5 kg    Exam:  General: NAD   Cardiovascular: S1, S2 present  Respiratory:  Decreased breath sounds bilaterally, poor respiratory effort  Abdomen: Soft, nontender, nondistended, bowel sounds present  Musculoskeletal: No bilateral pedal edema noted  Skin:  Scattered ecchymosis  Psychiatry:  Unable to assess   Data Reviewed: CBC: Recent Labs  Lab 03-14-2017 1717 01/16/18 0320  WBC 10.6* 8.4  NEUTROABS 8.7* 7.2  HGB 12.3* 12.0*  HCT 40.5 39.6  MCV 99.5 100.3*  PLT 111* 107*   Basic Metabolic Panel: Recent Labs  Lab 03-14-2017 1717 01/16/18 0320  NA 147* 145  K 4.6 3.7  CL 110 113*  CO2 29 23  GLUCOSE 106* 93  BUN 38* 31*  CREATININE 1.72* 1.11  CALCIUM 7.5* 7.6*   GFR: Estimated Creatinine Clearance: 40.3 mL/min (by C-G formula based on SCr of 1.11 mg/dL). Liver Function Tests: Recent Labs  Lab 03-14-2017 1717 01/16/18 0320  AST 83* 77*  ALT 8 22  ALKPHOS 112 100  BILITOT 0.8 1.2  PROT 6.5 5.9*  ALBUMIN 2.5* 2.3*   No results for input(s): LIPASE, AMYLASE in the last 168 hours. Recent Labs  Lab 03-14-2017 2116  AMMONIA 41*   Coagulation Profile: No results for input(s): INR, PROTIME in the last 168 hours. Cardiac Enzymes: Recent Labs  Lab 01/16/18 0402  TROPONINI <0.03   BNP (last 3 results) No results for input(s): PROBNP in the last 8760 hours. HbA1C: No results for input(s): HGBA1C in the last 72 hours. CBG: No results for input(s): GLUCAP in the last 168 hours. Lipid Profile: No results for input(s): CHOL, HDL, LDLCALC, TRIG, CHOLHDL, LDLDIRECT in the last 72 hours. Thyroid Function Tests: No results for input(s): TSH, T4TOTAL, FREET4, T3FREE,  THYROIDAB in the last 72 hours. Anemia Panel: No results for input(s): VITAMINB12, FOLATE, FERRITIN, TIBC, IRON, RETICCTPCT in the last 72 hours. Urine analysis:    Component Value Date/Time   COLORURINE AMBER (A) 01/28/18 1614   APPEARANCEUR HAZY (A) 2018/01/28 1614   LABSPEC 1.027 28-Jan-2018 1614   PHURINE 5.0 2018-01-28 1614   GLUCOSEU NEGATIVE 2018/01/28 1614   HGBUR MODERATE (A) 01/28/18 1614   BILIRUBINUR NEGATIVE 2018/01/28 1614   KETONESUR 5 (A) 01-28-2018 1614   PROTEINUR 30 (A) 28-Jan-2018 1614   NITRITE NEGATIVE 01-28-18 1614   LEUKOCYTESUR NEGATIVE 01-28-2018 1614   Sepsis Labs: @LABRCNTIP (procalcitonin:4,lacticidven:4)  ) Recent Results (from the past 240 hour(s))  Blood Culture (routine x 2)     Status: None (Preliminary result)   Collection Time: 28-Jan-2018  4:46 PM  Result Value Ref Range Status   Specimen Description   Final    BLOOD RIGHT FOREARM Performed at [facility], [address] [place], [place] [address]    Special Requests   Final    BOTTLES DRAWN AEROBIC AND ANAEROBIC Blood Culture results may not be optimal due to an inadequate volume of blood received in culture bottles Performed at [facility], [address] [place], [place] [address]    Culture   Final    NO GROWTH 4 DAYS Performed at [facility], [address], [place], [place] [address]    Report Status PENDING  Incomplete  Blood Culture (routine x 2)     Status: None (Preliminary result)   Collection Time: 28-Jan-2018  5:17 PM  Result Value Ref Range Status   Specimen Description   Final    BLOOD LEFT ANTECUBITAL Performed at [facility], [address] [place], [place] [address]    Special Requests   Final    BOTTLES DRAWN AEROBIC AND ANAEROBIC Blood Culture adequate volume Performed at [facility], [address] [place], [place] [address]    Culture   Final    NO GROWTH 4  DAYS Performed at [facility], [address], [place], [place] [address]    Report Status PENDING  Incomplete  MRSA PCR Screening     Status: None   Collection Time: 28-Jan-2018  9:42 PM  Result Value Ref Range Status   MRSA by PCR NEGATIVE NEGATIVE Final    Comment:        The GeneXpert MRSA Assay (FDA approved for NASAL specimens only), is one component of a comprehensive MRSA colonization surveillance program. It is not intended to diagnose MRSA infection nor to guide or monitor treatment for MRSA infections. Performed at [facility], [address], [place], [place] [address]   Culture, Urine     Status: None   Collection Time: 01/16/18  4:07 AM  Result Value Ref Range Status   Specimen Description   Final    URINE, CLEAN CATCH Performed at [facility], [address], [place], [place] [address]    Special Requests   Final    NONE Performed  at [facility], [address] [place], [place] [address]    Culture   Final    NO GROWTH Performed at [facility], [address] [place], [place] [address]    Report Status 01/17/2018 FINAL  Final      Studies: No results found.  Scheduled Meds: . fentaNYL  25 mcg Transdermal Q72H  . mouth rinse  15 mL Mouth Rinse q12n4p    Continuous Infusions:   LOS: 4 days     Briant Cedar, MD [facility]   If 7PM-7AM, please contact night-coverage [email or web address] 01/19/2018, 4:51 PM

## 2018-01-19 NOTE — Progress Notes (Signed)
Chaplain paged per daughter's request, page returned chaplain to see pt this evening.

## 2018-01-19 NOTE — Progress Notes (Signed)
Pt was awake when I arrived. I was informed by his nurse that family, who requested Chaplain visit, left shortly after I was paged. I had prayer bedside of and with pt. He groaned, made eye contact and raised his hand. He raised again as I waved at him upon leaving. Please page if additional support is needed. [phone] Chaplain Marjory Liesamela Kyri Shader Holder, MDiv   01/19/18 2100  Clinical Encounter Type  Visited With Patient

## 2018-01-20 DIAGNOSIS — G9341 Metabolic encephalopathy: Secondary | ICD-10-CM

## 2018-01-20 DIAGNOSIS — J189 Pneumonia, unspecified organism: Secondary | ICD-10-CM

## 2018-01-20 LAB — CULTURE, BLOOD (ROUTINE X 2)
Culture: NO GROWTH
Culture: NO GROWTH
SPECIAL REQUESTS: ADEQUATE

## 2018-01-20 NOTE — Progress Notes (Signed)
   01/20/18 1534  Clinical Encounter Type  Visited With Patient  Visit Type Initial  Referral From Nurse  Consult/Referral To Chaplain  The chaplain responded to RN request for Pt. spiritual care. Upon arrival the chaplain checked in with the  Pt.'s RN-April.  The RN noted the Pt. anxiety as he moves towards EOL.  The chaplain sat with the Pt.  During the time the Pt. and chaplain were together, the Pt. anxiousness declined. The chaplain prayed with the Pt. before leaving the room. The chaplain is available for follow up spiritual care as needed.

## 2018-01-20 NOTE — Progress Notes (Signed)
Clinical Social Worker [facility] this mornign and spoke with Carley Hammedva. Carley Hammedva stated they have a bed for patient today but stated that [facility] will need to reach out to family to do paper work. Carley Hammedva stated that Victorino DikeJennifer will reach out to [facility] when patient is able to discharge to facility today.   Marrianne MoodAshley Henrique Parekh, MSW,  Theresia MajorsLCSWA [phone]

## 2018-01-20 NOTE — Progress Notes (Signed)
[facility] PROGRESS NOTE    Progress Note  Jack Carter  UJW:[id number] DOB: 01/29/1934 DOA: 01/22/2018 PCP: Angela Coxasanayaka, Gayani Y, MD     Brief Narrative:   Jack Carter is an 84 y.o. male past medical history of CAD status post CABG Parkinson disease, chronic dysphagia admitted on 01/07/2018 for worsening encephalopathy, was found to be hypotensive and with fevers.  Assessment/Plan:   Acute metabolic encephalopathy secondary to aspiration pneumonia/HCAP: Patient was transitioned to comfort care. Palliative care Care was consulted and plan for residential hospice placement.  Parkinsonism [facility](HCC) Essential hypertension Chronic combined systolic and diastolic CHF (congestive heart failure) (HCC) Pressure injury of skin  RN Pressure Injury Documentation: Pressure Injury 01/10/2018 Stage I -  Intact skin with non-blanchable redness of a localized area usually over a bony prominence. (Active)  01/22/2018 2130   Location: Hip  Location Orientation: Left  Staging: Stage I -  Intact skin with non-blanchable redness of a localized area usually over a bony prominence.  Wound Description (Comments):   Present on Admission: Yes     Pressure Injury 01/19/2018 Stage I -  Intact skin with non-blanchable redness of a localized area usually over a bony prominence. (Active)  01/19/2018 2130   Location: Hip  Location Orientation: Right  Staging: Stage I -  Intact skin with non-blanchable redness of a localized area usually over a bony prominence.  Wound Description (Comments):   Present on Admission: Yes     Pressure Injury 01/13/2018 Stage I -  Intact skin with non-blanchable redness of a localized area usually over a bony prominence. (Active)  01/30/2018 2130   Location: Sacrum  Location Orientation:   Staging: Stage I -  Intact skin with non-blanchable redness of a localized area usually over a bony prominence.  Wound Description (Comments):   Present on Admission: Yes     Estimated body mass index is 17.58 kg/m as calculated from the following:   Height as of this encounter: 5\' 9"  (1.753 m).   Weight as of this encounter: 54 kg.  DVT prophylaxis: none Family Communication:none Disposition Plan/Barrier to D/C: Awaiting residential hospice facility placement. Code Status:     Code Status Orders  (From admission, onward)         Start     Ordered   01/16/18 1348  Do not attempt resuscitation (DNR)  Continuous    Question Answer Comment  In the event of cardiac or respiratory ARREST Do not call a "code blue"   In the event of cardiac or respiratory ARREST Do not perform Intubation, CPR, defibrillation or ACLS   In the event of cardiac or respiratory ARREST Use medication by any route, position, wound care, and other measures to relive pain and suffering. May use oxygen, suction and manual treatment of airway obstruction as needed for comfort.      01/16/18 1350        Code Status History    Date Active Date Inactive Code Status Order ID Comments User Context   01/02/2018 2114 01/16/2018 1350 Partial Code [id number]  Eduard ClosKakrakandy, Arshad N, MD ED   10/13/2017 1034 10/14/2017 2257 DNR [id number]  Romie MinusFreeman, Gene, MD Inpatient   10/07/2017 2248 10/13/2017 1034 Full Code [id number]  Onnie BoerEmokpae, Ejiroghene E, MD Inpatient   02/19/2017 0424 02/23/2017 1701 Full Code [id number]  Eduard ClosKakrakandy, Arshad N, MD ED   02/13/2017 2352 02/15/2017 1726 Full Code [id number]  Azalia Bilisampos, Kevin, MD ED   11/18/2016 2239 12/26/2016 1753 Full Code [id number]  Bills, [facility] Inpatient  11/04/2016 0952 11/18/2016 2003 Full Code [id number]  Cathren Harsh, MD Inpatient   10/31/2016 2118 11/04/2016 1914 DNR [id number]  Reyes Ivan, MD ED        IV Access:    Peripheral IV   Procedures and diagnostic studies:   No results found.   Medical Consultants:    None.  Anti-Infectives:   None  Subjective:    Jack Carter nonverbal  Objective:    Vitals:   01/18/18 0635  01/18/18 1531 01/18/18 2251 01/20/18 0800  BP: (!) 154/66 (!) 153/65 (!) 165/64   Pulse: 71 74 64   Resp:  19 18   Temp:  98.6 F (37 C) 98.7 F (37.1 C)   TempSrc:  Oral Oral   SpO2: 95% 97% 96%   Weight:    54 kg  Height:    5\' 9"  (1.753 m)    Intake/Output Summary (Last 24 hours) at 01/20/2018 1001 Last data filed at 01/20/2018 0700 Gross per 24 hour  Intake 0 ml  Output 800 ml  Net -800 ml   Filed Weights   02/04/18 2125 01/16/18 0500 01/20/18 0800  Weight: 55.8 kg 55.5 kg 54 kg    Exam: General exam: In no acute distress. Respiratory system: Good air movement and clear to auscultation. Cardiovascular system: S1 & S2 heard, RRR. Marland Kitchen  Gastrointestinal system: Abdomen is nondistended, soft and nontender.  Extremities: No pedal edema.    Data Reviewed:    Labs: Basic Metabolic Panel: Recent Labs  Lab Feb 04, 2018 1717 01/16/18 0320  NA 147* 145  K 4.6 3.7  CL 110 113*  CO2 29 23  GLUCOSE 106* 93  BUN 38* 31*  CREATININE 1.72* 1.11  CALCIUM 7.5* 7.6*   GFR Estimated Creatinine Clearance: 39.2 mL/min (by C-G formula based on SCr of 1.11 mg/dL). Liver Function Tests: Recent Labs  Lab 02-04-2018 1717 01/16/18 0320  AST 83* 77*  ALT 8 22  ALKPHOS 112 100  BILITOT 0.8 1.2  PROT 6.5 5.9*  ALBUMIN 2.5* 2.3*   No results for input(s): LIPASE, AMYLASE in the last 168 hours. Recent Labs  Lab 04-Feb-2018 2116  AMMONIA 41*   Coagulation profile No results for input(s): INR, PROTIME in the last 168 hours.  CBC: Recent Labs  Lab 02-04-2018 1717 01/16/18 0320  WBC 10.6* 8.4  NEUTROABS 8.7* 7.2  HGB 12.3* 12.0*  HCT 40.5 39.6  MCV 99.5 100.3*  PLT 111* 107*   Cardiac Enzymes: Recent Labs  Lab 01/16/18 0402  TROPONINI <0.03   BNP (last 3 results) No results for input(s): PROBNP in the last 8760 hours. CBG: No results for input(s): GLUCAP in the last 168 hours. D-Dimer: No results for input(s): DDIMER in the last 72 hours. Hgb A1c: No results for  input(s): HGBA1C in the last 72 hours. Lipid Profile: No results for input(s): CHOL, HDL, LDLCALC, TRIG, CHOLHDL, LDLDIRECT in the last 72 hours. Thyroid function studies: No results for input(s): TSH, T4TOTAL, T3FREE, THYROIDAB in the last 72 hours.  Invalid input(s): FREET3 Anemia work up: No results for input(s): VITAMINB12, FOLATE, FERRITIN, TIBC, IRON, RETICCTPCT in the last 72 hours. Sepsis Labs: Recent Labs  Lab 2018/02/04 1717  2018/02/04 1911 02/04/2018 1942 01/16/18 0320 01/16/18 0402 01/16/18 0611 01/16/18 0945  PROCALCITON  --   --   --   --   --  <0.10  --   --   WBC 10.6*  --   --   --  8.4  --   --   --  LATICACIDVEN  --    < > 2.19* 2.3*  --   --  1.4 1.4   < > = values in this interval not displayed.   Microbiology Recent Results (from the past 240 hour(s))  Blood Culture (routine x 2)     Status: None (Preliminary result)   Collection Time: 01/13/2018  4:46 PM  Result Value Ref Range Status   Specimen Description   Final    BLOOD RIGHT FOREARM Performed at [facility], [address] [place], [place] [address]    Special Requests   Final    BOTTLES DRAWN AEROBIC AND ANAEROBIC Blood Culture results may not be optimal due to an inadequate volume of blood received in culture bottles Performed at [facility], [address], [place], [place] [address]    Culture   Final    NO GROWTH 4 DAYS Performed at [facility], [address], [place], [place] [address]    Report Status PENDING  Incomplete  Blood Culture (routine x 2)     Status: None (Preliminary result)   Collection Time: 01/30/2018  5:17 PM  Result Value Ref Range Status   Specimen Description   Final    BLOOD LEFT ANTECUBITAL Performed at [facility], [address] [place], [place] [address]    Special Requests   Final    BOTTLES DRAWN AEROBIC AND ANAEROBIC Blood Culture adequate volume Performed at [facility],  [address], [place], [place] [address]    Culture   Final    NO GROWTH 4 DAYS Performed at [facility], [address] [place], [place] [address]    Report Status PENDING  Incomplete  MRSA PCR Screening     Status: None   Collection Time: 01/04/2018  9:42 PM  Result Value Ref Range Status   MRSA by PCR NEGATIVE NEGATIVE Final    Comment:        The GeneXpert MRSA Assay (FDA approved for NASAL specimens only), is one component of a comprehensive MRSA colonization surveillance program. It is not intended to diagnose MRSA infection nor to guide or monitor treatment for MRSA infections. Performed at [facility], [address], [place], [place] [address]   Culture, Urine     Status: None   Collection Time: 01/16/18  4:07 AM  Result Value Ref Range Status   Specimen Description   Final    URINE, CLEAN CATCH Performed at [facility], [address], [place], [place] [address]    Special Requests   Final    NONE Performed at [facility], [address], [place], [place] [address]    Culture   Final    NO GROWTH Performed at [facility], [address], [place], [place] [address]    Report Status 01/17/2018 FINAL  Final     Medications:   . fentaNYL  25 mcg Transdermal Q72H  . mouth rinse  15 mL Mouth Rinse q12n4p   Continuous Infusions:    LOS: 5 days   Marinda Elk  [facility]   *Please refer to [email or web address], password [email or web address] to get updated schedule on who will round on this patient, as hospitalists switch teams weekly. If 7PM-7AM, please contact night-coverage at [email or web address], password [email or web address] for any overnight needs.  01/20/2018, 10:01 AM

## 2018-01-20 NOTE — Progress Notes (Signed)
[facility])   [facility] will have a bed for Mr. Jack Carter on 11/21.  Spoke with his daughter Jack Carter by phone to make arrangements to complete necessary paperwork.  Jack Carter is unable to come until around 430 today to complete paperwork.  [facility] is aware and will be able to accept Mr. Jack Carter first thing 11/21.    Updated CSW.  Please call with any hospice related questions.  Wallis BambergJennifer Woody BSN, RN [facility] Hospital Liaison (listed in [place]) [phone]

## 2018-01-20 NOTE — Care Management Note (Signed)
Case Management Note  Patient Details  Name: Jack Carter MRN: [id number] Date of Birth: 08/11/1933  Subjective/Objective: Medically stable for d/c. [facility]-CSW managing.                   Action/Plan:djc Residential Hospice Facility.   Expected Discharge Date:                  Expected Discharge Plan:  Hospice Medical Facility  In-House Referral:  Clinical Social Work  Discharge planning Services  CM Consult  Post Acute Care Choice:    Choice offered to:     DME Arranged:    DME Agency:     HH Arranged:    HH Agency:     Status of Service:  Completed, signed off  If discussed at [employer] Meetings, dates discussed:    Additional Comments:  [name], Yaeko Fazekas, RN 01/20/2018, 11:59 AM

## 2018-01-20 NOTE — Progress Notes (Signed)
[facility])  Met with daughter [name] at the bedside to complete paperwork to transfer to [facility].  [name] is no longer comfortable with the decision to transfer to [facility] and wants to wait until tomorrow to reevaluate.  She had not seen him since Monday (she is dealing with her own illness), and he looked much different than she expected and she is fearful that he would pass in transit.  Also states pt uncle was currently traveling to see him and she did not want to him to miss that visit.  [name] was tearful and did not want to move forward tonight.   Advised her we will continue to follow him and support any way we can.  [name] would appreciate a call from the physician to hear if they feel he is stable for transfer.  [facility] will continue to follow.  Please call with any questions or concerns.  Venia Carbon BSN, RN [facility] Liaison (listed in [place]

## 2018-01-31 NOTE — Death Summary Note (Signed)
Death Summary  Jack Carter:[id number] DOB: Jul 19, 1933 DOA: 01-31-2018  PCP: [name], MD  Admit date: 01-31-18 Date of Death: 05-Feb-2018 Time of Death: 6:00 am Notification: [name], MD notified of death of 2018-02-06   History of present illness:  84 year old with past medical history of coronary artery disease status post CABG Parkinson disease, chronic dysphagia history of ESBL UTI with multiple admissions came in with confusion and fever  Final Diagnoses:  1 acute encephalopathy secondary to aspiration pneumonia: Started empirically  on antibiotics, the concept of comfort care and hospice was introduced to the family after meeting with hospice and palliative care decided to move towards comfort care. They agreed to stopping all medication and all labs.  The family was comfortable with decision. He was in the hospital waiting for placement when he passed away.   The results of significant diagnostics from this hospitalization (including imaging, microbiology, ancillary and laboratory) are listed below for reference.    Significant Diagnostic Studies: Ct Head Wo Contrast  Result Date: 01/16/2018 CLINICAL DATA:  84 year old with altered level of consciousness. EXAM: CT HEAD WITHOUT CONTRAST TECHNIQUE: Contiguous axial images were obtained from the base of the skull through the vertex without intravenous contrast. COMPARISON:  02/18/2017 FINDINGS: Brain: No evidence for acute hemorrhage, mass lesion, midline shift, hydrocephalus or large infarct. Stable cerebral atrophy. Low-density in the periventricular white matter is suggestive for chronic changes. Vascular: No hyperdense vessel or unexpected calcification. Skull: Normal. Negative for fracture or focal lesion. Sinuses/Orbits: Chronic mucosal thickening and calcifications in the right maxillary sinus. Mild mucosal thickening in the ethmoid air cells. Other: None IMPRESSION: 1. No acute intracranial  abnormality. 2. Stable atrophy and evidence for chronic small vessel ischemic changes. 3. Chronic right maxillary sinus disease. Electronically Signed   By: Richarda Overlie M.D.   On: 01/16/2018 09:07   Dg Chest Port 1 View  Result Date: 2018-01-31 CLINICAL DATA:  Altered mental status, shortness of breath EXAM: PORTABLE CHEST 1 VIEW COMPARISON:  10/07/2017 FINDINGS: Multifocal patchy opacities in the right upper lobe and bilateral lower lobes, suspicious for pneumonia, right lower lobe predominant. No pleural effusion or pneumothorax. Postsurgical changes in the left lung apex. The heart is normal in size. Postsurgical changes related to prior CABG. Thoracic aortic atherosclerosis. IMPRESSION: Multifocal patchy opacities, suspicious for pneumonia, right lower lobe predominant. Electronically Signed   By: Charline Bills M.D.   On: January 31, 2018 17:45    Microbiology: Recent Results (from the past 240 hour(s))  Blood Culture (routine x 2)     Status: None   Collection Time: 2018-01-31  4:46 PM  Result Value Ref Range Status   Specimen Description   Final    BLOOD RIGHT FOREARM Performed at [facility], [address], [place], [place] [address]    Special Requests   Final    BOTTLES DRAWN AEROBIC AND ANAEROBIC Blood Culture results may not be optimal due to an inadequate volume of blood received in culture bottles Performed at [facility], [address] [place], [place] [address]    Culture   Final    NO GROWTH 5 DAYS Performed at [facility], [address], [place], [place] [address]    Report Status 02-05-18 FINAL  Final  Blood Culture (routine x 2)     Status: None   Collection Time: 2018/01/31  5:17 PM  Result Value Ref Range Status   Specimen Description   Final  BLOOD LEFT ANTECUBITAL Performed at [facility], [address] [place], [place] [address]    Special Requests   Final    BOTTLES DRAWN AEROBIC AND  ANAEROBIC Blood Culture adequate volume Performed at [facility], [address] [place], [place] [address]    Culture   Final    NO GROWTH 5 DAYS Performed at [facility], [address] [place], [place] [address]    Report Status 01/20/2018 FINAL  Final  MRSA PCR Screening     Status: None   Collection Time: 2017/04/15  9:42 PM  Result Value Ref Range Status   MRSA by PCR NEGATIVE NEGATIVE Final    Comment:        The GeneXpert MRSA Assay (FDA approved for NASAL specimens only), is one component of a comprehensive MRSA colonization surveillance program. It is not intended to diagnose MRSA infection nor to guide or monitor treatment for MRSA infections. Performed at [facility], [address] [place], [place] [address]   Culture, Urine     Status: None   Collection Time: 01/16/18  4:07 AM  Result Value Ref Range Status   Specimen Description   Final    URINE, CLEAN CATCH Performed at [facility], [address] [place], [place] [address]    Special Requests   Final    NONE Performed at [facility], [address] [place], [place] [address]    Culture   Final    NO GROWTH Performed at [facility], [address] [place], [place] [address]    Report Status 01/17/2018 FINAL  Final     Labs: Basic Metabolic Panel: Recent Labs  Lab 2017/04/15 1717 01/16/18 0320  NA 147* 145  K 4.6 3.7  CL 110 113*  CO2 29 23  GLUCOSE 106* 93  BUN 38* 31*  CREATININE 1.72* 1.11  CALCIUM 7.5* 7.6*   Liver Function Tests: Recent Labs  Lab 2017/04/15 1717 01/16/18 0320  AST 83* 77*  ALT 8 22  ALKPHOS 112 100  BILITOT 0.8 1.2  PROT 6.5 5.9*  ALBUMIN 2.5* 2.3*   No results for input(s): LIPASE, AMYLASE in the last 168 hours. Recent Labs  Lab 2017/04/15 2116  AMMONIA 41*   CBC: Recent Labs  Lab 2017/04/15 1717 01/16/18 0320  WBC 10.6* 8.4  NEUTROABS 8.7* 7.2  HGB 12.3*  12.0*  HCT 40.5 39.6  MCV 99.5 100.3*  PLT 111* 107*   Cardiac Enzymes: Recent Labs  Lab 01/16/18 0402  TROPONINI <0.03   D-Dimer No results for input(s): DDIMER in the last 72 hours. BNP: Invalid input(s): POCBNP CBG: No results for input(s): GLUCAP in the last 168 hours. Anemia work up No results for input(s): VITAMINB12, FOLATE, FERRITIN, TIBC, IRON, RETICCTPCT in the last 72 hours. Urinalysis    Component Value Date/Time   COLORURINE AMBER (A) 2017-08-01 1614   APPEARANCEUR HAZY (A) 2017-08-01 1614   LABSPEC 1.027 2017-08-01 1614   PHURINE 5.0 2017-08-01 1614   GLUCOSEU NEGATIVE 2017-08-01 1614   HGBUR MODERATE (A) 2017-08-01 1614   BILIRUBINUR NEGATIVE 2017-08-01 1614   KETONESUR 5 (A) 2017-08-01 1614   PROTEINUR 30 (A) 2017-08-01 1614   NITRITE NEGATIVE 2017-08-01 1614   LEUKOCYTESUR NEGATIVE 2017-08-01 1614   Sepsis Labs Invalid input(s): PROCALCITONIN,  WBC,  LACTICIDVEN   SIGNED:  Marinda ElkAbraham Feliz Ortiz, MD  [facility] 01/22/2018, 10:39 AM Pager   If 7PM-7AM, please contact  night-coverage [email or web address] Password [email or web address]

## 2018-01-31 NOTE — Progress Notes (Signed)
Pts respirations increased, breathing labored, pt's daughter Idalia Needleaige called and informed pt's condition worsening, pt stated she plans on coming by 8 am.

## 2018-01-31 NOTE — Progress Notes (Signed)
Palliative care progress note  Reason for consult: Goals of care  I met today with Jack Carter and his family, including daughter, [name].  His daughter reports that she has seen a distinct change in her father since yesterday and is concerned that he may die in transport to residential hospice.  He has family that is coming to visit him, and she is worried that rushing transport may cause him to die prior to their arrival.  On exam: General: Alert, awake, makes eye contact, but does not verbalize Heart: Regular rate and rhythm. No murmur appreciated. Lungs: Diminished air movement, scattered rhonchi Abdomen: Soft, nontender, nondistended, positive bowel sounds.  Ext: No significant edema Skin: Warm and dry Neuro: Moves extremities, looks through examiner, does not follow commands  A/P: 84 y.o. male  with past medical history of CAD s/p CABG, Parkinson's disease, dysphagia, HTN, HLD, restless leg syndrome admitted on 01/08/2018 from SNF with altered mental status and aspiration pneumonia/HCAP who has been transitioned to comfort care.   - Plan has been working toward [employer] for residential hospice.  His family expressed concern regarding his stability for transport and with what appear to be acute changes today, agree that we should reassess in AM.   - Will plan to reassess in AM to determine if he appears stable for transition.  If not, he will have hospital death.  Total time: 25 minutes Greater than 50%  of this time was spent counseling and coordinating care related to the above assessment and plan.  Micheline Rough, MD [place] Team [phone]

## 2018-01-31 DEATH — deceased

## 2018-03-17 ENCOUNTER — Ambulatory Visit: Payer: Medicare Other | Admitting: Neurology

## 2018-06-27 IMAGING — DX DG CHEST 1V PORT
1 series · 1 of 1 positions shown · non-contrast
Comparison: 01/20/2017

CLINICAL DATA: ARDS.

EXAM:
PORTABLE CHEST 1 VIEW

[chest ap]
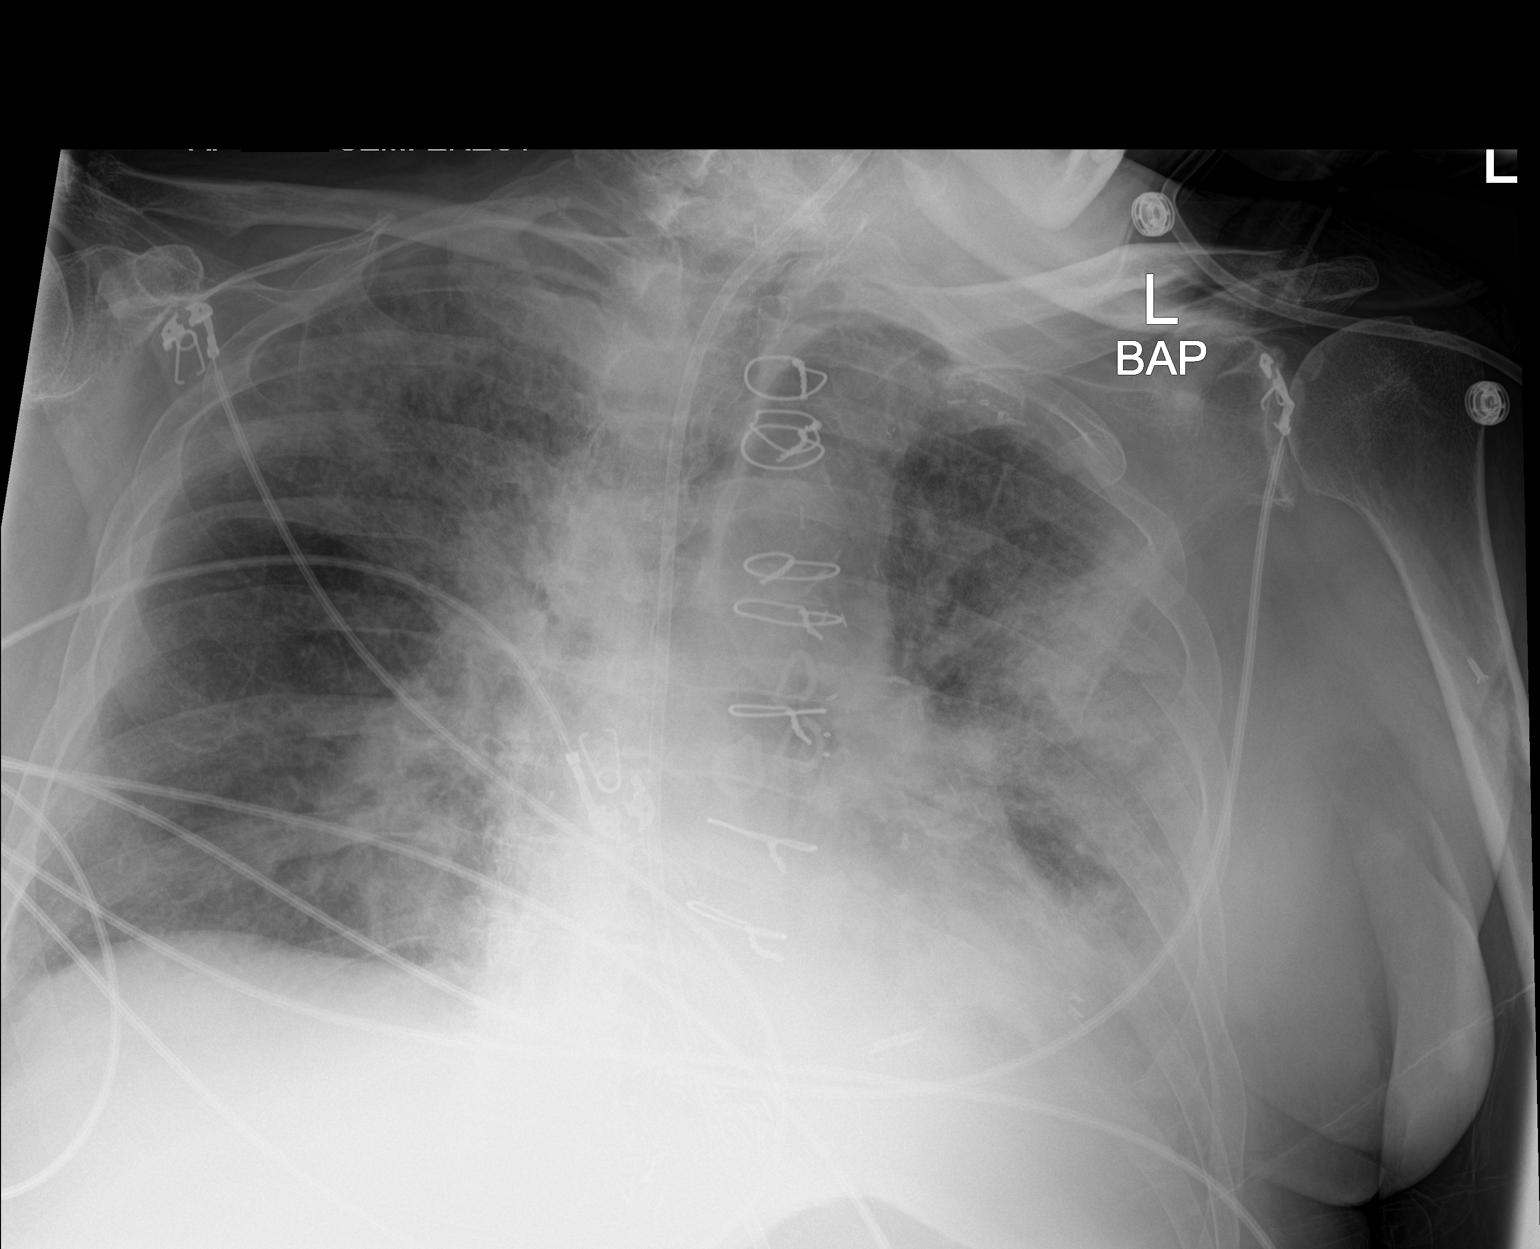

[1 of 1 positions shown; findings below may reference images not displayed]

FINDINGS: The heart size and mediastinal contours are stable. Patchy
consolidation throughout bilateral lungs are identified minimally
improved particularly in the right upper lobe compared prior exam.
There is left pleural effusion. A feeding tube is identified with
distal tip not included on film.
IMPRESSION: Patchy consolidation throughout bilateral lungs, minimally improved
particularly in the right upper lobe compared to prior exam.

## 2018-06-29 IMAGING — DX DG CHEST 1V PORT
1 series · 1 of 1 positions shown · non-contrast
Comparison: 11/14/2016

CLINICAL DATA: Post central line placement

EXAM:
PORTABLE CHEST 1 VIEW

[chest]
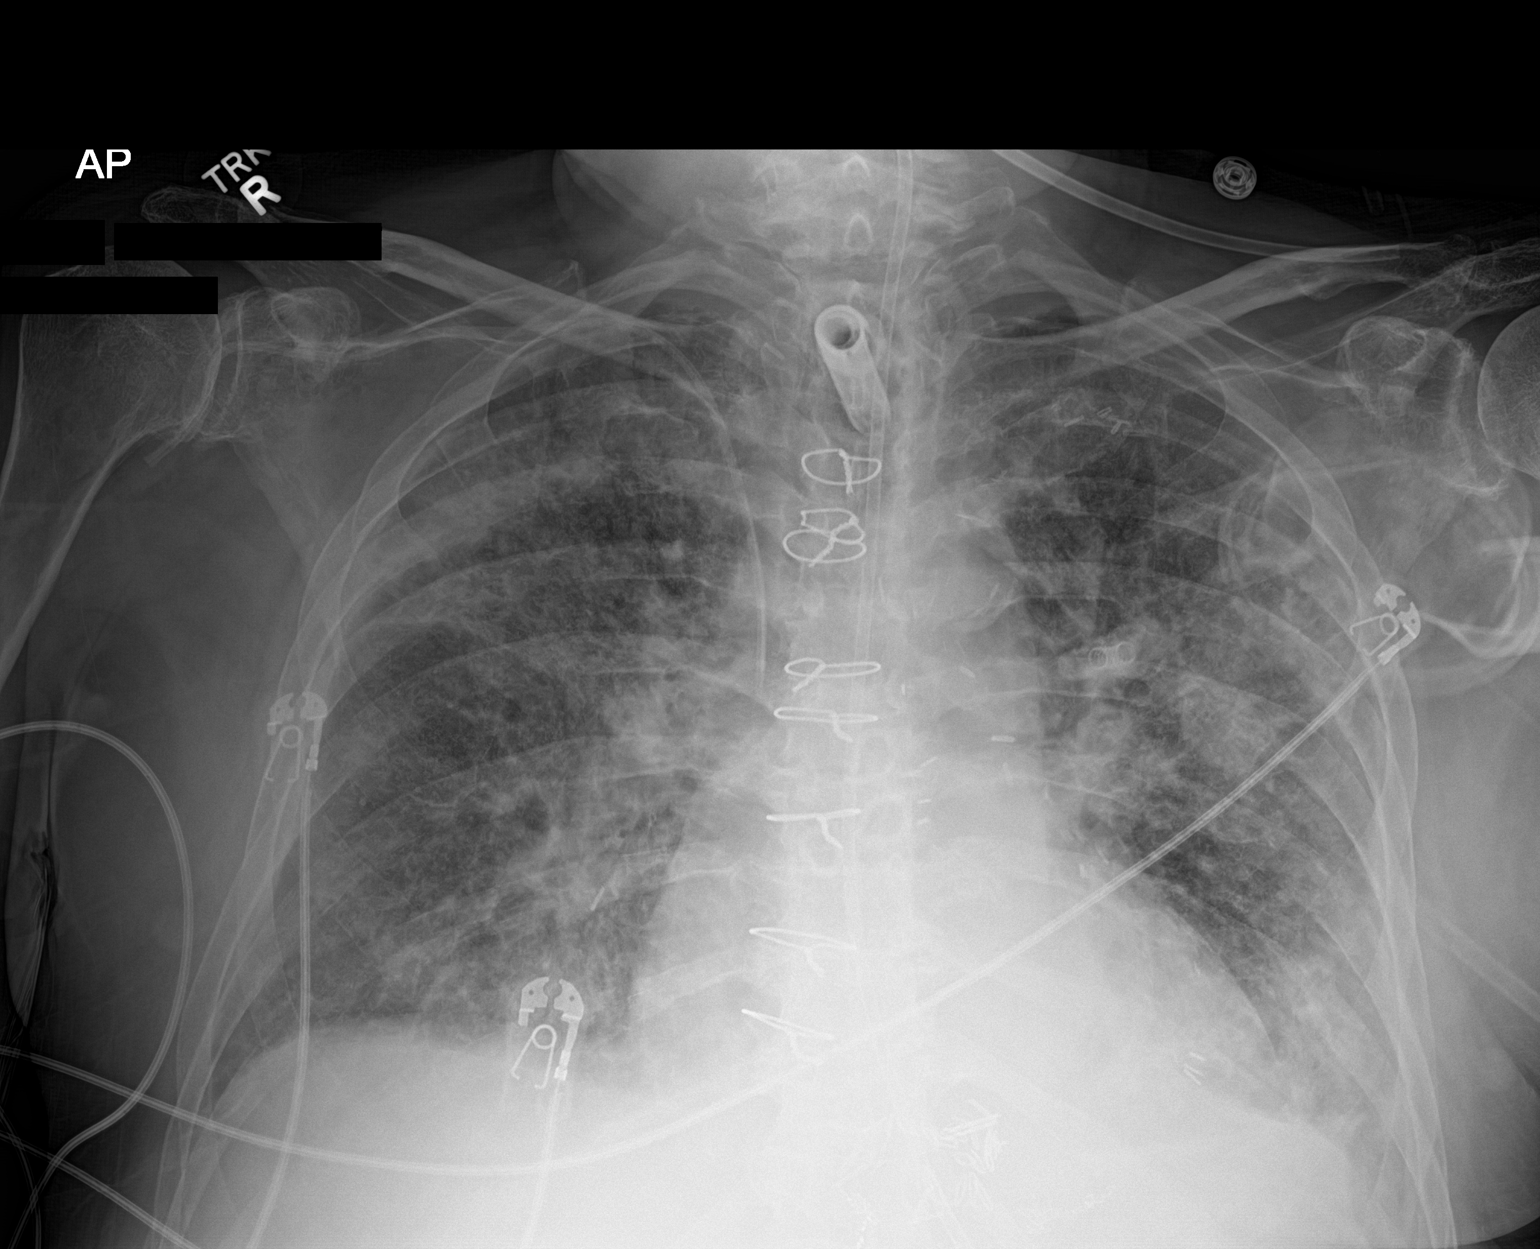

[1 of 1 positions shown; findings below may reference images not displayed]

FINDINGS: Right Central line placement with the tip in the SVC. No
pneumothorax. Tracheostomy tube is unchanged. Prior CABG. Mild
cardiomegaly. Severe diffuse bilateral airspace disease, not
significantly changed.
IMPRESSION: Right central line tip in the SVC.  No pneumothorax.

Stable severe diffuse bilateral airspace disease.

## 2018-06-30 IMAGING — DX DG CHEST 1V PORT
1 series · 1 of 1 positions shown · non-contrast
Comparison: Chest x-ray from yesterday.

CLINICAL DATA: Pneumonia.

EXAM:
PORTABLE CHEST 1 VIEW

[chest ap]
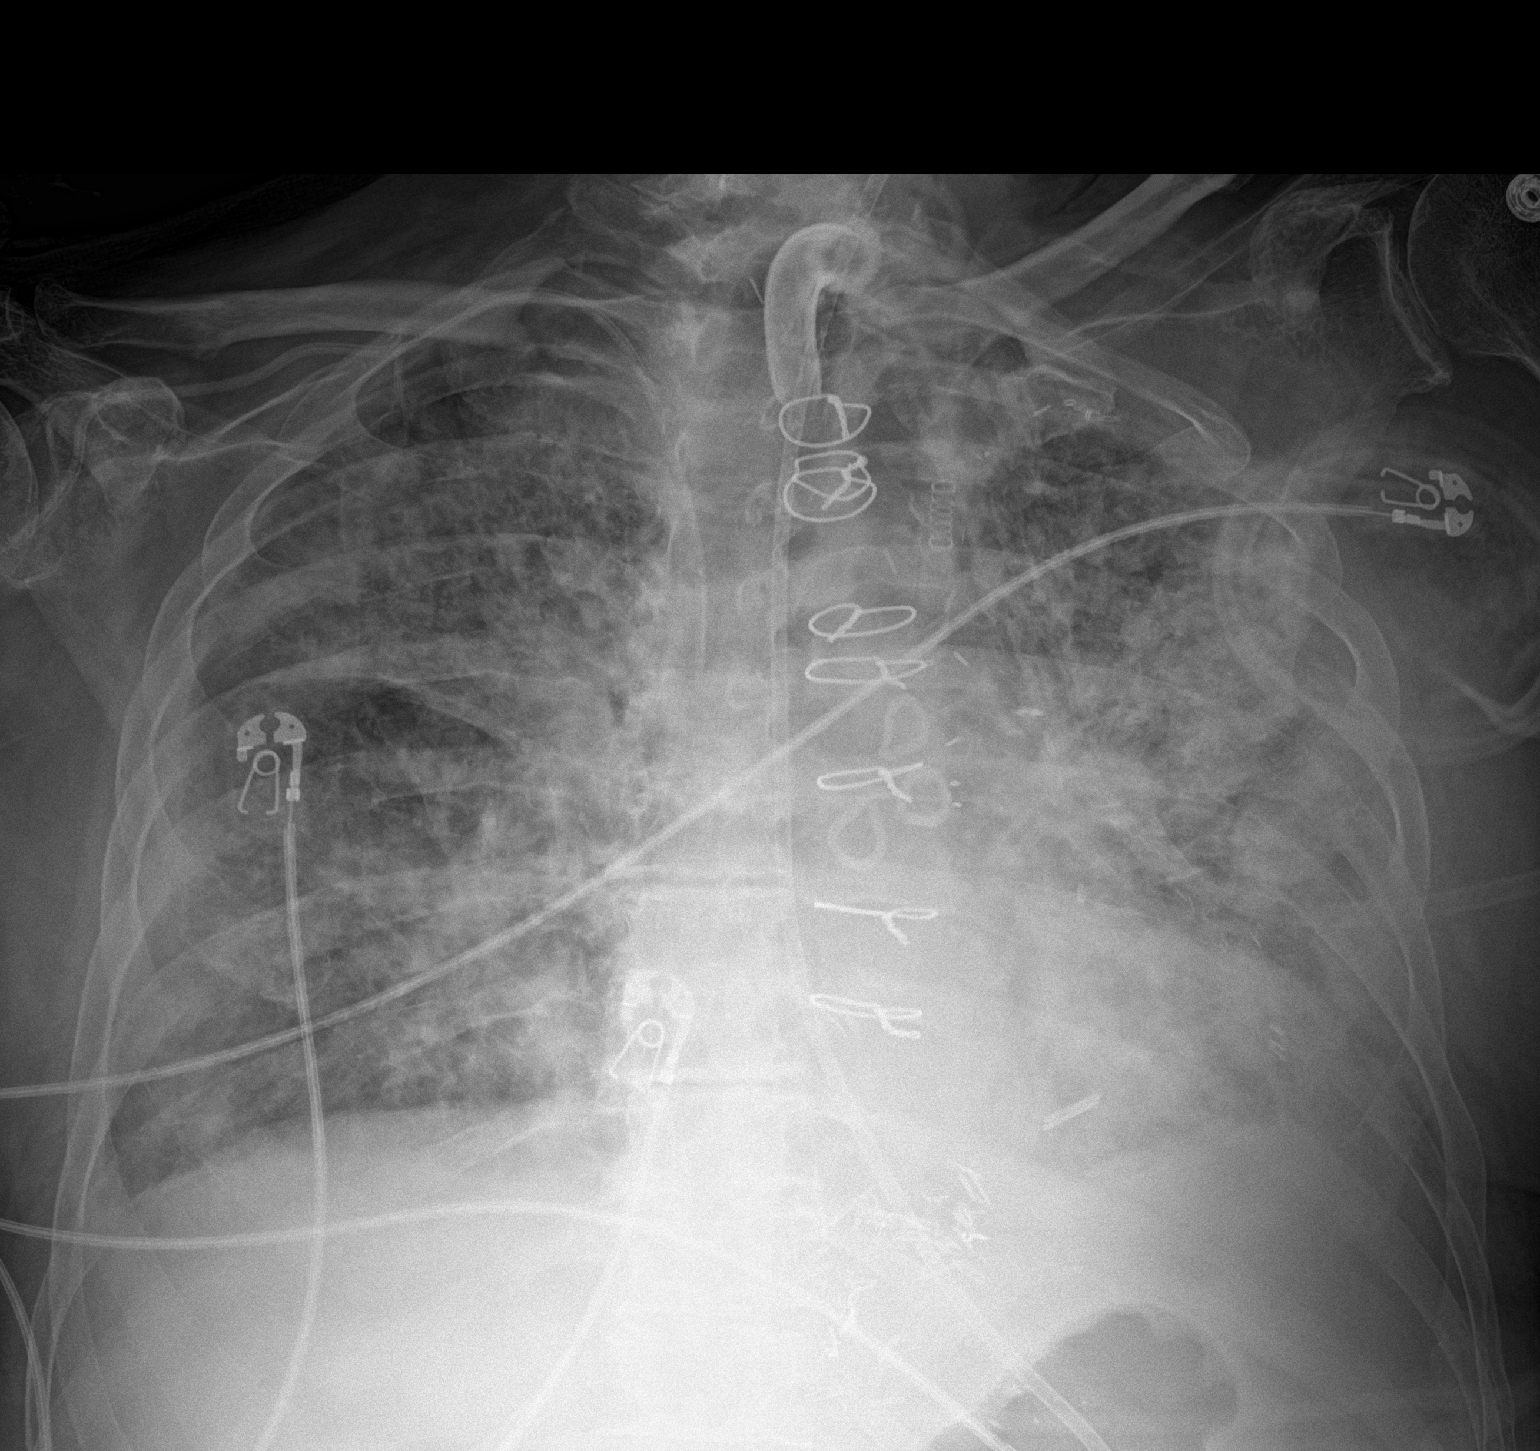

[1 of 1 positions shown; findings below may reference images not displayed]

FINDINGS: Unchanged right central line with the tip in the mid SVC. Unchanged
positioning of the tracheostomy tube. A feeding tube enters the
stomach, with the tip beyond the field of view.

Prior CABG. The cardiomediastinal silhouette remains enlarged.
Severe diffuse bilateral pulmonary opacities appear slightly
worsened when compared to prior study. Small left pleural effusion.
No pneumothorax.
IMPRESSION: Worsening severe diffuse pulmonary opacities.

## 2018-07-01 IMAGING — CR DG CHEST 1V PORT
1 series · 1 of 1 positions shown · non-contrast
Comparison: 11/15/2016

CLINICAL DATA: Acute respiratory failure.

EXAM:
PORTABLE CHEST 1 VIEW

[AP]
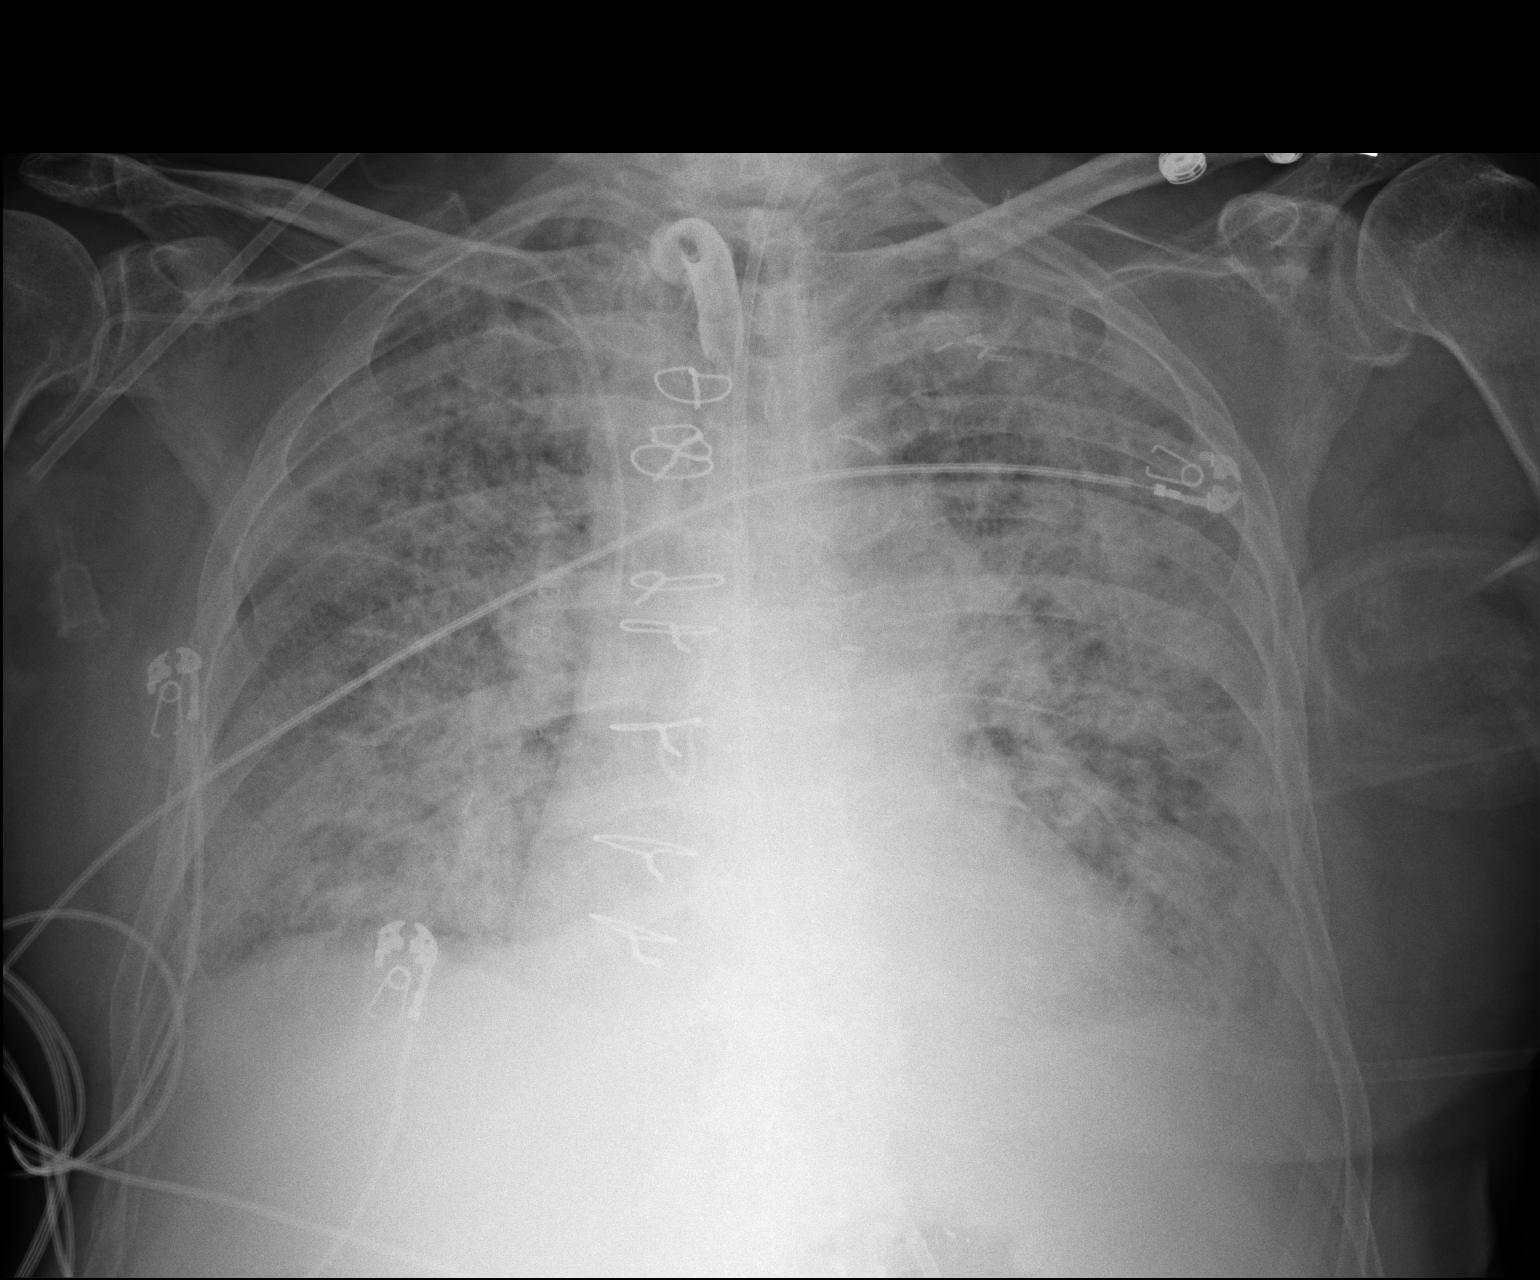

[1 of 1 positions shown; findings below may reference images not displayed]

FINDINGS: PET extends beyond the inferior aspect of the film. Right-sided
subclavian line terminates at the low SVC. Prior median sternotomy.
Tracheostomy appropriately position. Normal heart size.
Atherosclerosis in the transverse aorta. No definite pleural fluid.
No pneumothorax. Diffuse airspace disease is not significantly
changed.
IMPRESSION: No significant change in diffuse airspace disease. Considerations
remain ARDS, infection, or aspiration.

Aortic Atherosclerosis ([id number].V).

## 2018-07-05 IMAGING — CT CT ANGIO CHEST
2 of 7 series · 18 of 46 positions shown · IV contrast (APPLIED)
Comparison: Chest x-ray dated 11/18/2016. Chest CT dated
11/10/2016.

CLINICAL DATA: Pulmonary embolism suspected.

EXAM:
CT ANGIOGRAPHY CHEST WITH CONTRAST
TECHNIQUE: Multidetector CT imaging of the chest was performed using the
standard protocol during bolus administration of intravenous
contrast. Multiplanar CT image reconstructions and MIPs were
obtained to evaluate the vascular anatomy.
CONTRAST:  67 cc Isovue 370

[Series 7: thins · axial · 0.78mm/px · z∈[+1226,+1496]mm · 15 of 436 slices shown]
[im 25/436  lung]
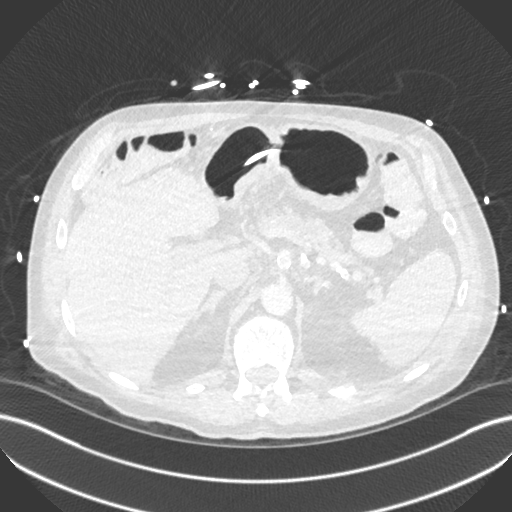
[im 49/436  soft-tissue]
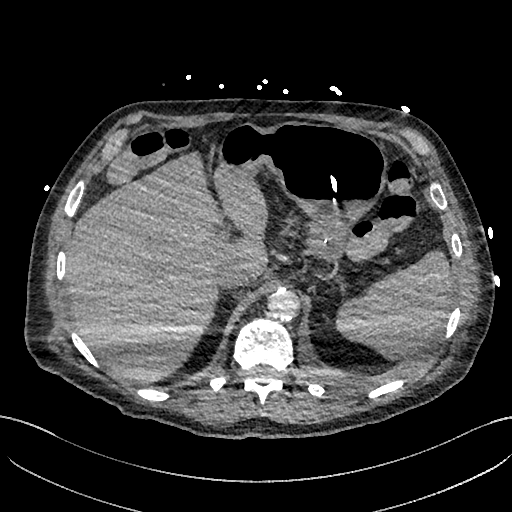
[im 73/436  lung]
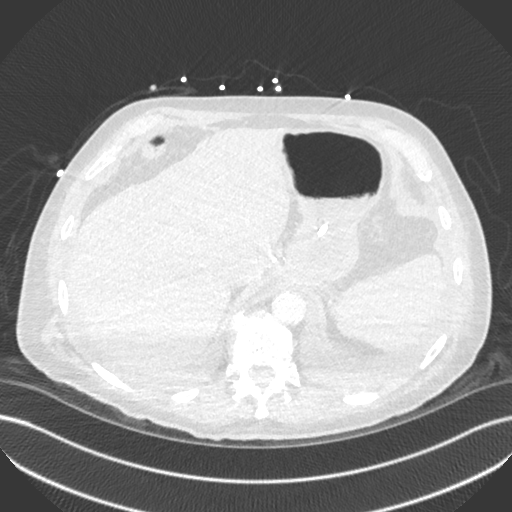
[im 97/436  soft-tissue]
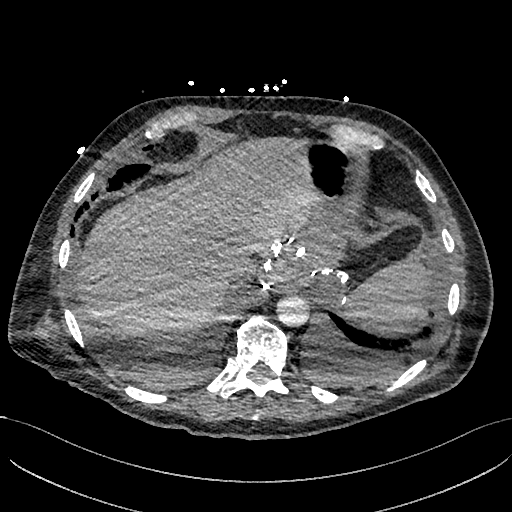
[im 146/436  lung]
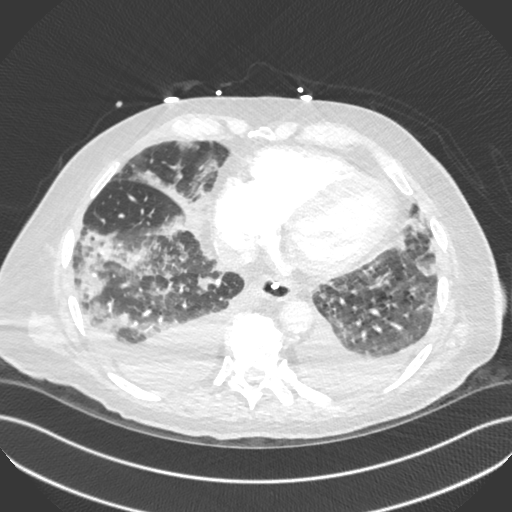
[im 170/436  soft-tissue]
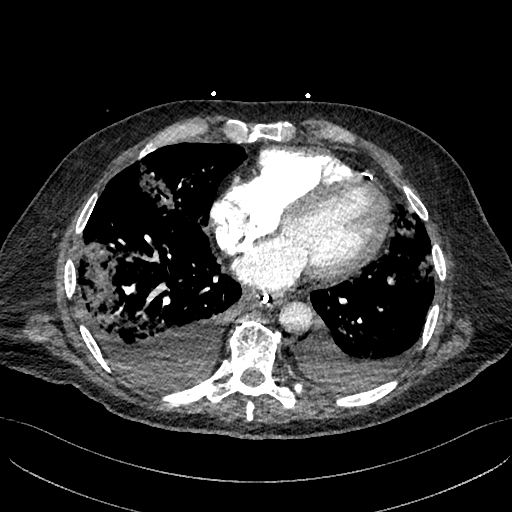
[im 194/436  lung]
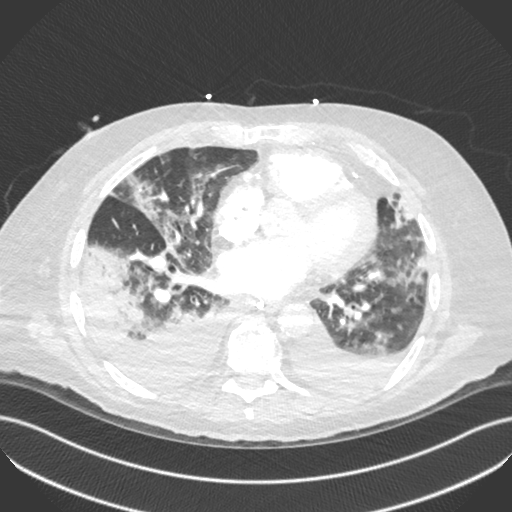
[im 218/436  soft-tissue]
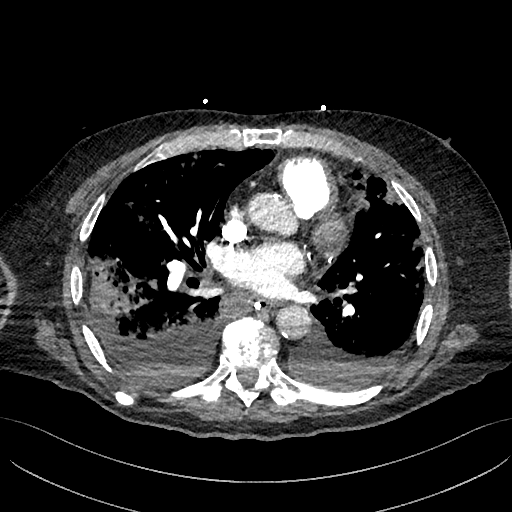
[im 242/436  lung]
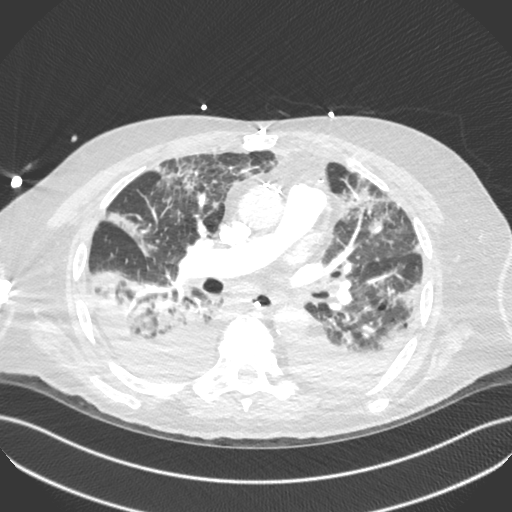
[im 266/436  soft-tissue]
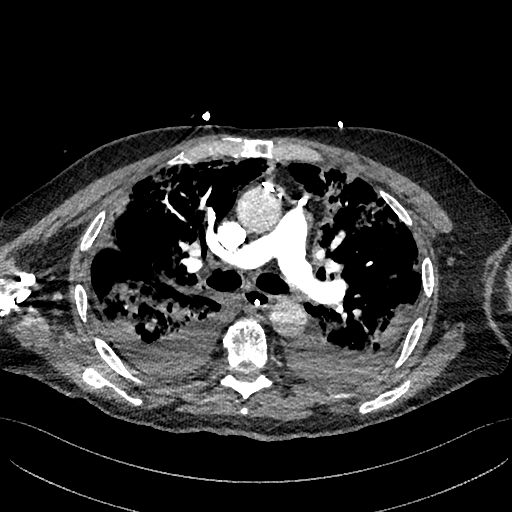
[im 291/436  lung]
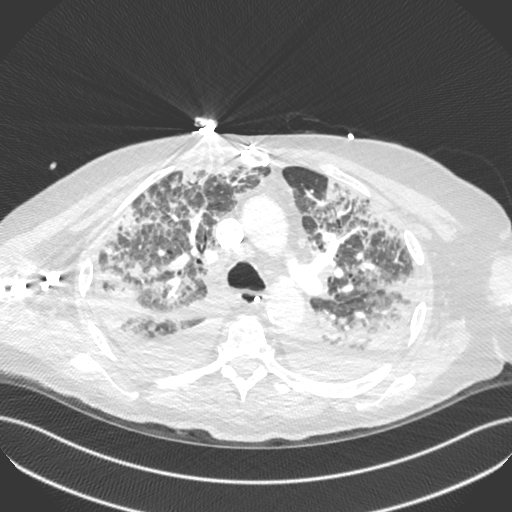
[im 339/436  soft-tissue]
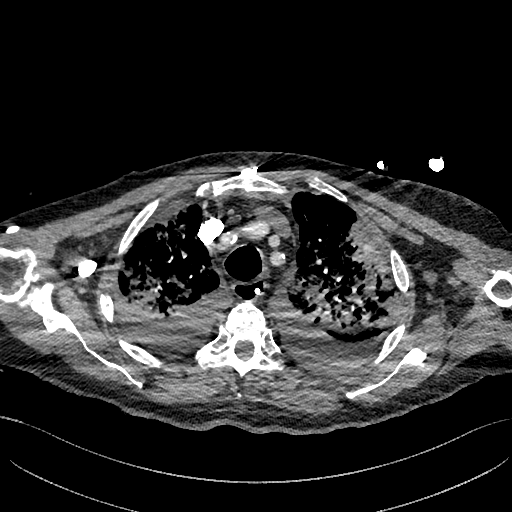
[im 363/436  lung]
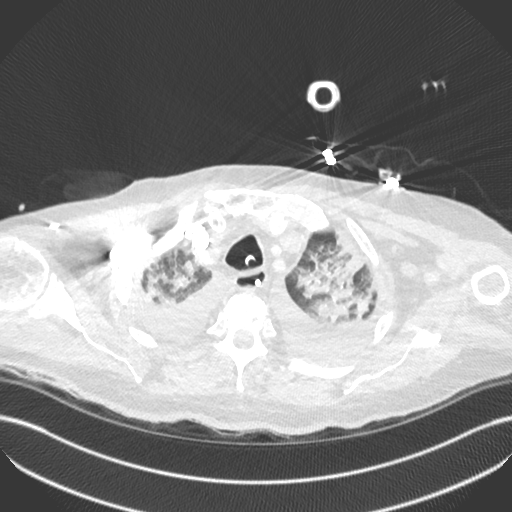
[im 387/436  soft-tissue]
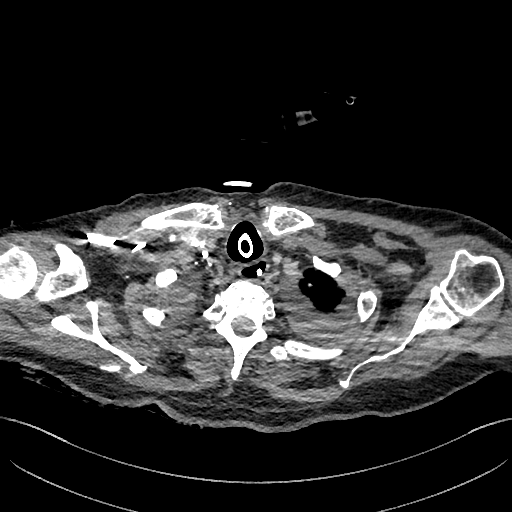
[im 411/436  lung]
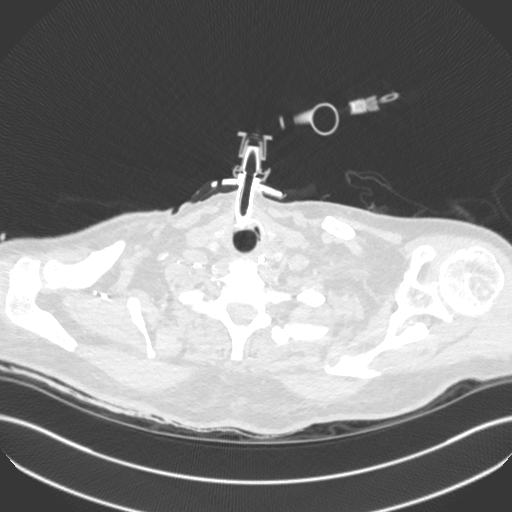

[Series 8: cor · coronal · 0.60mm/px · 3 of 151 slices shown]
[im 38/151  soft-tissue]
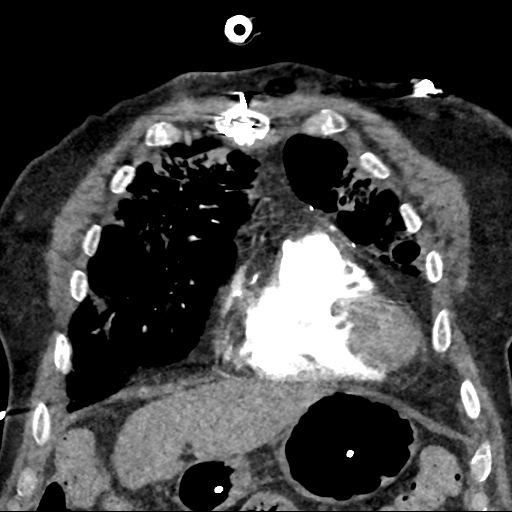
[im 76/151  soft-tissue]
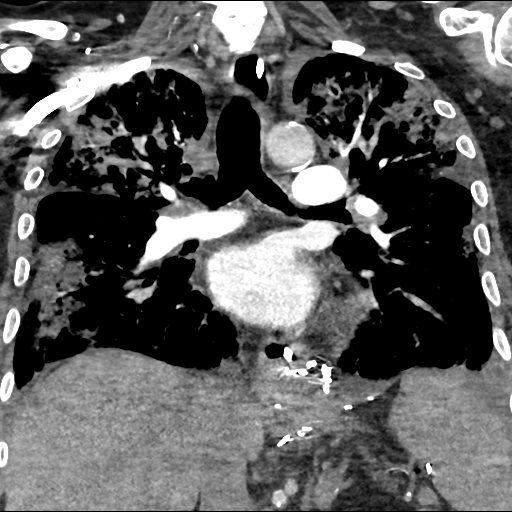
[im 113/151  soft-tissue]
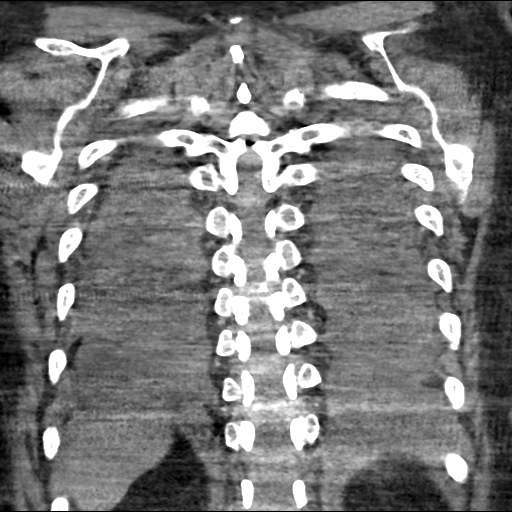

[18 of 46 positions shown; findings below may reference images not displayed]

FINDINGS: Cardiovascular: Some of the most peripheral segmental and
subsegmental pulmonary arteries are difficult to definitively
characterize due to mild patient breathing motion artifact and
bibasilar atelectasis, however, there is no pulmonary embolism
identified within the main, lobar or central segmental pulmonary
arteries bilaterally.

Heart size is upper normal. No pericardial effusion. No aortic
aneurysm or evidence of aortic dissection. Aortic atherosclerosis.
Coronary artery calcifications.

Mediastinum/Nodes: Scattered small lymph nodes. No enlarged lymph
nodes or masses identified within the mediastinum or perihilar
regions.

Surgical changes at the gastroesophageal junction, presumably
related to hiatal hernia repair. Enteric tube is well positioned in
the stomach. Tracheostomy tube appears appropriately positioned.
Trachea and central bronchi otherwise unremarkable.

Lungs/Pleura: Diffuse ground-glass opacities within the mid and
upper lung zones bilaterally, slightly decreased in extent compared
to the chest CT of 11/10/2016. New denser consolidation within the
right lower lobe, pneumonia versus aspiration. Additional patchy
consolidations within the left lower lobe, also compatible with
pneumonia or aspiration.

Moderate pleural effusions bilaterally with adjacent compressive
atelectasis. No pneumothorax.

Upper Abdomen: No acute findings in the upper abdomen. Enteric tube
appears appropriately position with tip at the level of the
pylorus/duodenal bulb.

Musculoskeletal: Degenerative changes throughout the thoracolumbar
spine, at least moderate in degree.

Slightly displaced fracture of the right anterior sixth rib, acute
to subacute appearing. Additional subacute to the chronic fractures
of the right anterior-lateral eighth through tenth ribs.

Review of the MIP images confirms the above findings.
IMPRESSION: 1. No pulmonary embolism, with mild study limitations detailed
above.
2. Diffuse ground-glass opacities within the mid and upper lung
zones bilaterally, most likely related to pulmonary edema, slightly
decreased in extent compared to the chest CT of 11/10/2016.
3. New dense consolidations within the right lower lobe and left
lower lobe, pneumonia versus aspiration.
4. Moderate bilateral pleural effusions, slightly increased in size
compared to the earlier exam, with associated bibasilar compressive
atelectasis.
5. Slightly displaced fracture of the right anterior sixth rib,
acute to subacute. Additional subacute to chronic fractures of the
right eighth through tenth ribs.
6. Aortic atherosclerosis.
7. Support apparatus appears appropriately positioned, as detailed
above.
